# Patient Record
Sex: Male | Born: 1945 | Race: White | Hispanic: No | Marital: Married | Smoking: Former smoker
Health system: Southern US, Community
[De-identification: ages and names within clinical notes are randomized; demographics above are authoritative.]

## PROBLEM LIST (undated history)

## (undated) DIAGNOSIS — K5790 Diverticulosis of intestine, part unspecified, without perforation or abscess without bleeding: Secondary | ICD-10-CM

## (undated) DIAGNOSIS — Z95820 Peripheral vascular angioplasty status with implants and grafts: Secondary | ICD-10-CM

## (undated) DIAGNOSIS — I255 Ischemic cardiomyopathy: Secondary | ICD-10-CM

## (undated) DIAGNOSIS — E118 Type 2 diabetes mellitus with unspecified complications: Secondary | ICD-10-CM

## (undated) DIAGNOSIS — E782 Mixed hyperlipidemia: Secondary | ICD-10-CM

## (undated) DIAGNOSIS — M545 Low back pain: Secondary | ICD-10-CM

## (undated) DIAGNOSIS — K921 Melena: Secondary | ICD-10-CM

## (undated) DIAGNOSIS — K219 Gastro-esophageal reflux disease without esophagitis: Secondary | ICD-10-CM

## (undated) DIAGNOSIS — I723 Aneurysm of iliac artery: Secondary | ICD-10-CM

## (undated) DIAGNOSIS — I2510 Atherosclerotic heart disease of native coronary artery without angina pectoris: Secondary | ICD-10-CM

## (undated) DIAGNOSIS — N200 Calculus of kidney: Secondary | ICD-10-CM

## (undated) DIAGNOSIS — E119 Type 2 diabetes mellitus without complications: Secondary | ICD-10-CM

## (undated) DIAGNOSIS — I4891 Unspecified atrial fibrillation: Secondary | ICD-10-CM

## (undated) DIAGNOSIS — M2550 Pain in unspecified joint: Secondary | ICD-10-CM

## (undated) DIAGNOSIS — I10 Essential (primary) hypertension: Secondary | ICD-10-CM

## (undated) DIAGNOSIS — R001 Bradycardia, unspecified: Secondary | ICD-10-CM

## (undated) DIAGNOSIS — I219 Acute myocardial infarction, unspecified: Secondary | ICD-10-CM

## (undated) DIAGNOSIS — M1990 Unspecified osteoarthritis, unspecified site: Secondary | ICD-10-CM

## (undated) DIAGNOSIS — R42 Dizziness and giddiness: Secondary | ICD-10-CM

## (undated) DIAGNOSIS — F419 Anxiety disorder, unspecified: Secondary | ICD-10-CM

## (undated) HISTORY — DX: Low back pain: M54.5

## (undated) HISTORY — DX: Pain in unspecified joint: M25.50

## (undated) HISTORY — PX: CYSTOSCOPY W/ URETEROSCOPY W/ LITHOTRIPSY: SUR380

## (undated) HISTORY — DX: Aneurysm of iliac artery: I72.3

## (undated) HISTORY — DX: Anxiety disorder, unspecified: F41.9

## (undated) HISTORY — DX: Melena: K92.1

## (undated) HISTORY — DX: Type 2 diabetes mellitus with unspecified complications: E11.8

## (undated) HISTORY — DX: Gastro-esophageal reflux disease without esophagitis: K21.9

## (undated) HISTORY — DX: Essential (primary) hypertension: I10

## (undated) HISTORY — DX: Mixed hyperlipidemia: E78.2

## (undated) HISTORY — DX: Atherosclerotic heart disease of native coronary artery without angina pectoris: I25.10

## (undated) HISTORY — DX: Acute myocardial infarction, unspecified: I21.9

## (undated) HISTORY — DX: Dizziness and giddiness: R42

## (undated) HISTORY — DX: Unspecified atrial fibrillation: I48.91

## (undated) HISTORY — DX: Diverticulosis of intestine, part unspecified, without perforation or abscess without bleeding: K57.90

## (undated) HISTORY — PX: CYSTOSCOPY W/ STONE MANIPULATION: SHX1427

---
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
## 2000-02-16 ENCOUNTER — Ambulatory Visit (HOSPITAL_COMMUNITY): Admission: RE | Admit: 2000-02-16 | Discharge: 2000-02-16 | Payer: Self-pay | Admitting: *Deleted

## 2000-02-16 ENCOUNTER — Encounter (INDEPENDENT_AMBULATORY_CARE_PROVIDER_SITE_OTHER): Payer: Self-pay | Admitting: Specialist

## 2001-08-22 ENCOUNTER — Emergency Department (HOSPITAL_COMMUNITY): Admission: EM | Admit: 2001-08-22 | Discharge: 2001-08-22 | Payer: Self-pay | Admitting: *Deleted

## 2001-08-22 ENCOUNTER — Encounter: Payer: Self-pay | Admitting: *Deleted

## 2004-01-28 ENCOUNTER — Ambulatory Visit (HOSPITAL_COMMUNITY): Admission: RE | Admit: 2004-01-28 | Discharge: 2004-01-28 | Payer: Self-pay | Admitting: *Deleted

## 2004-01-28 ENCOUNTER — Encounter (INDEPENDENT_AMBULATORY_CARE_PROVIDER_SITE_OTHER): Payer: Self-pay | Admitting: *Deleted

## 2004-07-12 HISTORY — PX: CARDIAC CATHETERIZATION: SHX172

## 2004-07-12 HISTORY — PX: CORONARY ARTERY BYPASS GRAFT: SHX141

## 2004-08-19 ENCOUNTER — Inpatient Hospital Stay (HOSPITAL_COMMUNITY): Admission: RE | Admit: 2004-08-19 | Discharge: 2004-08-24 | Payer: Self-pay | Admitting: Cardiothoracic Surgery

## 2006-10-26 ENCOUNTER — Encounter (INDEPENDENT_AMBULATORY_CARE_PROVIDER_SITE_OTHER): Payer: Self-pay | Admitting: Specialist

## 2006-10-26 ENCOUNTER — Ambulatory Visit (HOSPITAL_COMMUNITY): Admission: RE | Admit: 2006-10-26 | Discharge: 2006-10-26 | Payer: Self-pay | Admitting: *Deleted

## 2007-04-29 ENCOUNTER — Inpatient Hospital Stay (HOSPITAL_COMMUNITY): Admission: EM | Admit: 2007-04-29 | Discharge: 2007-04-29 | Payer: Self-pay | Admitting: Emergency Medicine

## 2007-05-12 ENCOUNTER — Ambulatory Visit: Payer: Self-pay | Admitting: Gastroenterology

## 2008-10-25 ENCOUNTER — Inpatient Hospital Stay (HOSPITAL_COMMUNITY): Admission: EM | Admit: 2008-10-25 | Discharge: 2008-10-28 | Payer: Self-pay | Admitting: Emergency Medicine

## 2008-12-06 ENCOUNTER — Ambulatory Visit (HOSPITAL_COMMUNITY): Admission: RE | Admit: 2008-12-06 | Discharge: 2008-12-06 | Payer: Self-pay | Admitting: Urology

## 2009-10-01 ENCOUNTER — Encounter: Admission: RE | Admit: 2009-10-01 | Discharge: 2009-10-01 | Payer: Self-pay | Admitting: Neurosurgery

## 2009-11-04 ENCOUNTER — Ambulatory Visit: Payer: Self-pay | Admitting: Vascular Surgery

## 2009-11-04 ENCOUNTER — Encounter: Admission: RE | Admit: 2009-11-04 | Discharge: 2009-11-04 | Payer: Self-pay | Admitting: Vascular Surgery

## 2009-11-18 HISTORY — PX: NM MYOCAR PERF WALL MOTION: HXRAD629

## 2010-01-20 ENCOUNTER — Ambulatory Visit: Payer: Self-pay | Admitting: Vascular Surgery

## 2010-02-12 ENCOUNTER — Ambulatory Visit (HOSPITAL_COMMUNITY): Admission: RE | Admit: 2010-02-12 | Discharge: 2010-02-12 | Payer: Self-pay | Admitting: Surgery

## 2010-02-12 ENCOUNTER — Ambulatory Visit: Payer: Self-pay | Admitting: Surgery

## 2010-02-18 ENCOUNTER — Inpatient Hospital Stay (HOSPITAL_COMMUNITY): Admission: RE | Admit: 2010-02-18 | Discharge: 2010-02-20 | Payer: Self-pay | Admitting: Vascular Surgery

## 2010-02-18 HISTORY — PX: ILIAC ARTERY STENT: SHX1786

## 2010-03-17 ENCOUNTER — Encounter: Admission: RE | Admit: 2010-03-17 | Discharge: 2010-03-17 | Payer: Self-pay | Admitting: Vascular Surgery

## 2010-03-17 ENCOUNTER — Ambulatory Visit: Payer: Self-pay | Admitting: Vascular Surgery

## 2010-03-31 ENCOUNTER — Ambulatory Visit: Payer: Self-pay | Admitting: Vascular Surgery

## 2010-08-01 ENCOUNTER — Other Ambulatory Visit: Payer: Self-pay | Admitting: Vascular Surgery

## 2010-08-01 DIAGNOSIS — I723 Aneurysm of iliac artery: Secondary | ICD-10-CM

## 2010-09-25 LAB — GLUCOSE, CAPILLARY
Glucose-Capillary: 110 mg/dL — ABNORMAL HIGH (ref 70–99)
Glucose-Capillary: 112 mg/dL — ABNORMAL HIGH (ref 70–99)
Glucose-Capillary: 118 mg/dL — ABNORMAL HIGH (ref 70–99)
Glucose-Capillary: 136 mg/dL — ABNORMAL HIGH (ref 70–99)
Glucose-Capillary: 154 mg/dL — ABNORMAL HIGH (ref 70–99)
Glucose-Capillary: 156 mg/dL — ABNORMAL HIGH (ref 70–99)
Glucose-Capillary: 157 mg/dL — ABNORMAL HIGH (ref 70–99)
Glucose-Capillary: 160 mg/dL — ABNORMAL HIGH (ref 70–99)
Glucose-Capillary: 167 mg/dL — ABNORMAL HIGH (ref 70–99)
Glucose-Capillary: 172 mg/dL — ABNORMAL HIGH (ref 70–99)
Glucose-Capillary: 184 mg/dL — ABNORMAL HIGH (ref 70–99)
Glucose-Capillary: 187 mg/dL — ABNORMAL HIGH (ref 70–99)

## 2010-09-25 LAB — COMPREHENSIVE METABOLIC PANEL
ALT: 16 U/L (ref 0–53)
ALT: 19 U/L (ref 0–53)
AST: 36 U/L (ref 0–37)
AST: 43 U/L — ABNORMAL HIGH (ref 0–37)
Albumin: 3 g/dL — ABNORMAL LOW (ref 3.5–5.2)
Albumin: 3.6 g/dL (ref 3.5–5.2)
Alkaline Phosphatase: 61 U/L (ref 39–117)
Alkaline Phosphatase: 72 U/L (ref 39–117)
BUN: 10 mg/dL (ref 6–23)
BUN: 7 mg/dL (ref 6–23)
CO2: 22 mEq/L (ref 19–32)
CO2: 23 mEq/L (ref 19–32)
Calcium: 7.9 mg/dL — ABNORMAL LOW (ref 8.4–10.5)
Calcium: 8.5 mg/dL (ref 8.4–10.5)
Chloride: 102 mEq/L (ref 96–112)
Chloride: 109 mEq/L (ref 96–112)
Creatinine, Ser: 0.87 mg/dL (ref 0.4–1.5)
Creatinine, Ser: 0.91 mg/dL (ref 0.4–1.5)
GFR calc Af Amer: >60 mL/min (ref >60)
GFR calc Af Amer: >60 mL/min (ref >60)
GFR calc non Af Amer: >60 mL/min (ref >60)
GFR calc non Af Amer: >60 mL/min (ref >60)
Glucose, Bld: 148 mg/dL — ABNORMAL HIGH (ref 70–99)
Glucose, Bld: 180 mg/dL — ABNORMAL HIGH (ref 70–99)
Potassium: 3.4 mEq/L — ABNORMAL LOW (ref 3.5–5.1)
Potassium: 3.7 mEq/L (ref 3.5–5.1)
Sodium: 134 mEq/L — ABNORMAL LOW (ref 135–145)
Sodium: 137 mEq/L (ref 135–145)
Total Bilirubin: 0.8 mg/dL (ref 0.3–1.2)
Total Bilirubin: 1 mg/dL (ref 0.3–1.2)
Total Protein: 5.4 g/dL — ABNORMAL LOW (ref 6.0–8.3)
Total Protein: 5.9 g/dL — ABNORMAL LOW (ref 6.0–8.3)

## 2010-09-25 LAB — CBC
HCT: 37 % — ABNORMAL LOW (ref 39.0–52.0)
HCT: 37.2 % — ABNORMAL LOW (ref 39.0–52.0)
HCT: 42.3 % (ref 39.0–52.0)
Hemoglobin: 13 g/dL (ref 13.0–17.0)
Hemoglobin: 13.1 g/dL (ref 13.0–17.0)
Hemoglobin: 14.9 g/dL (ref 13.0–17.0)
MCH: 29.6 pg (ref 26.0–34.0)
MCH: 29.7 pg (ref 26.0–34.0)
MCH: 29.9 pg (ref 26.0–34.0)
MCHC: 35.1 g/dL (ref 30.0–36.0)
MCHC: 35.2 g/dL (ref 30.0–36.0)
MCHC: 35.2 g/dL (ref 30.0–36.0)
MCV: 84.3 fL (ref 78.0–100.0)
MCV: 84.3 fL (ref 78.0–100.0)
MCV: 84.9 fL (ref 78.0–100.0)
Platelets: 102 10*3/uL — ABNORMAL LOW (ref 150–400)
Platelets: 120 10*3/uL — ABNORMAL LOW (ref 150–400)
Platelets: 134 10*3/uL — ABNORMAL LOW (ref 150–400)
RBC: 4.38 MIL/uL (ref 4.22–5.81)
RBC: 4.39 MIL/uL (ref 4.22–5.81)
RBC: 5.02 MIL/uL (ref 4.22–5.81)
RDW: 12.7 % (ref 11.5–15.5)
RDW: 12.7 % (ref 11.5–15.5)
RDW: 12.8 % (ref 11.5–15.5)
WBC: 10.1 10*3/uL (ref 4.0–10.5)
WBC: 7.3 10*3/uL (ref 4.0–10.5)
WBC: 7.5 10*3/uL (ref 4.0–10.5)

## 2010-09-25 LAB — PROTIME-INR
INR: 1.03 (ref 0.00–1.49)
INR: 1.12 (ref 0.00–1.49)
Prothrombin Time: 13.7 seconds (ref 11.6–15.2)
Prothrombin Time: 14.6 seconds (ref 11.6–15.2)

## 2010-09-25 LAB — BLOOD GAS, ARTERIAL
Acid-base deficit: 0.3 mmol/L (ref 0.0–2.0)
Bicarbonate: 23.8 mEq/L (ref 20.0–24.0)
Drawn by: 206361
FIO2: 0.21 %
O2 Saturation: 98.3 %
Patient temperature: 98.6
TCO2: 25 mmol/L (ref 0–100)
pCO2 arterial: 39 mmHg (ref 35.0–45.0)
pH, Arterial: 7.403 (ref 7.350–7.450)
pO2, Arterial: 79.3 mmHg — ABNORMAL LOW (ref 80.0–100.0)

## 2010-09-25 LAB — BASIC METABOLIC PANEL
BUN: 10 mg/dL (ref 6–23)
BUN: 8 mg/dL (ref 6–23)
CO2: 19 mEq/L (ref 19–32)
CO2: 28 mEq/L (ref 19–32)
Calcium: 7.7 mg/dL — ABNORMAL LOW (ref 8.4–10.5)
Calcium: 8.3 mg/dL — ABNORMAL LOW (ref 8.4–10.5)
Chloride: 105 mEq/L (ref 96–112)
Chloride: 105 mEq/L (ref 96–112)
Creatinine, Ser: 0.9 mg/dL (ref 0.4–1.5)
Creatinine, Ser: 1.09 mg/dL (ref 0.4–1.5)
GFR calc Af Amer: >60 mL/min (ref >60)
GFR calc Af Amer: >60 mL/min (ref >60)
GFR calc non Af Amer: >60 mL/min (ref >60)
GFR calc non Af Amer: >60 mL/min (ref >60)
Glucose, Bld: 138 mg/dL — ABNORMAL HIGH (ref 70–99)
Glucose, Bld: 168 mg/dL — ABNORMAL HIGH (ref 70–99)
Potassium: 3.8 mEq/L (ref 3.5–5.1)
Potassium: 3.8 mEq/L (ref 3.5–5.1)
Sodium: 133 mEq/L — ABNORMAL LOW (ref 135–145)
Sodium: 138 mEq/L (ref 135–145)

## 2010-09-25 LAB — CROSSMATCH
ABO/RH(D): A POS
Antibody Screen: NEGATIVE

## 2010-09-25 LAB — POCT I-STAT, CHEM 8
BUN: 13 mg/dL (ref 6–23)
Calcium, Ion: 1 mmol/L — ABNORMAL LOW (ref 1.12–1.32)
Chloride: 108 mEq/L (ref 96–112)
Creatinine, Ser: 1 mg/dL (ref 0.4–1.5)
Glucose, Bld: 178 mg/dL — ABNORMAL HIGH (ref 70–99)
HCT: 47 % (ref 39.0–52.0)
Hemoglobin: 16 g/dL (ref 13.0–17.0)
Potassium: 3.6 mEq/L (ref 3.5–5.1)
Sodium: 139 mEq/L (ref 135–145)
TCO2: 22 mmol/L (ref 0–100)

## 2010-09-25 LAB — APTT
aPTT: 33 seconds (ref 24–37)
aPTT: 40 seconds — ABNORMAL HIGH (ref 24–37)

## 2010-09-25 LAB — URINALYSIS, ROUTINE W REFLEX MICROSCOPIC
Bilirubin Urine: NEGATIVE
Glucose, UA: NEGATIVE mg/dL
Hgb urine dipstick: NEGATIVE
Ketones, ur: NEGATIVE mg/dL
Nitrite: NEGATIVE
Protein, ur: NEGATIVE mg/dL
Specific Gravity, Urine: 1.031 — ABNORMAL HIGH (ref 1.005–1.030)
Urobilinogen, UA: 1 mg/dL (ref 0.0–1.0)
pH: 6.5 (ref 5.0–8.0)

## 2010-09-25 LAB — SURGICAL PCR SCREEN
MRSA, PCR: NEGATIVE
Staphylococcus aureus: POSITIVE — AB

## 2010-09-25 LAB — MAGNESIUM
Magnesium: 1.6 mg/dL (ref 1.5–2.5)
Magnesium: 1.7 mg/dL (ref 1.5–2.5)

## 2010-09-25 LAB — AMYLASE
Amylase: 129 U/L — ABNORMAL HIGH (ref 0–105)
Amylase: 93 U/L (ref 0–105)

## 2010-09-29 ENCOUNTER — Ambulatory Visit: Payer: Self-pay | Admitting: Vascular Surgery

## 2010-09-29 ENCOUNTER — Inpatient Hospital Stay: Admission: RE | Admit: 2010-09-29 | Payer: Self-pay | Source: Ambulatory Visit

## 2010-10-13 ENCOUNTER — Ambulatory Visit: Payer: Self-pay | Admitting: Vascular Surgery

## 2010-10-20 ENCOUNTER — Ambulatory Visit (INDEPENDENT_AMBULATORY_CARE_PROVIDER_SITE_OTHER): Payer: Medicare Other | Admitting: Vascular Surgery

## 2010-10-20 ENCOUNTER — Ambulatory Visit
Admission: RE | Admit: 2010-10-20 | Discharge: 2010-10-20 | Disposition: A | Discharge status: Home / Self Care | Payer: Medicare Other | Source: Ambulatory Visit | Attending: Vascular Surgery | Admitting: Vascular Surgery

## 2010-10-20 DIAGNOSIS — I723 Aneurysm of iliac artery: Secondary | ICD-10-CM

## 2010-10-20 MED ORDER — IOHEXOL 350 MG/ML SOLN
100.0000 mL | Freq: Once | INTRAVENOUS | Status: AC | PRN
  Administered 2010-10-20: 100 mL via INTRAVENOUS

## 2010-10-21 LAB — BASIC METABOLIC PANEL
BUN: 13 mg/dL (ref 6–23)
BUN: 13 mg/dL (ref 6–23)
BUN: 14 mg/dL (ref 6–23)
BUN: 20 mg/dL (ref 6–23)
CO2: 26 mEq/L (ref 19–32)
CO2: 27 mEq/L (ref 19–32)
CO2: 28 mEq/L (ref 19–32)
CO2: 30 mEq/L (ref 19–32)
Calcium: 8.1 mg/dL — ABNORMAL LOW (ref 8.4–10.5)
Calcium: 8.7 mg/dL (ref 8.4–10.5)
Calcium: 8.7 mg/dL (ref 8.4–10.5)
Calcium: 9.1 mg/dL (ref 8.4–10.5)
Chloride: 105 mEq/L (ref 96–112)
Chloride: 106 mEq/L (ref 96–112)
Chloride: 107 mEq/L (ref 96–112)
Chloride: 107 mEq/L (ref 96–112)
Creatinine, Ser: 1.01 mg/dL (ref 0.4–1.5)
Creatinine, Ser: 1.23 mg/dL (ref 0.4–1.5)
Creatinine, Ser: 1.26 mg/dL (ref 0.4–1.5)
Creatinine, Ser: 1.6 mg/dL — ABNORMAL HIGH (ref 0.4–1.5)
GFR calc Af Amer: 53 mL/min — ABNORMAL LOW (ref >60)
GFR calc Af Amer: >60 mL/min (ref >60)
GFR calc Af Amer: >60 mL/min (ref >60)
GFR calc Af Amer: >60 mL/min (ref >60)
GFR calc non Af Amer: 44 mL/min — ABNORMAL LOW (ref >60)
GFR calc non Af Amer: 58 mL/min — ABNORMAL LOW (ref >60)
GFR calc non Af Amer: 60 mL/min — ABNORMAL LOW (ref >60)
GFR calc non Af Amer: >60 mL/min (ref >60)
Glucose, Bld: 110 mg/dL — ABNORMAL HIGH (ref 70–99)
Glucose, Bld: 129 mg/dL — ABNORMAL HIGH (ref 70–99)
Glucose, Bld: 181 mg/dL — ABNORMAL HIGH (ref 70–99)
Glucose, Bld: 98 mg/dL (ref 70–99)
Potassium: 3.6 mEq/L (ref 3.5–5.1)
Potassium: 3.8 mEq/L (ref 3.5–5.1)
Potassium: 4 mEq/L (ref 3.5–5.1)
Potassium: 4 mEq/L (ref 3.5–5.1)
Sodium: 138 mEq/L (ref 135–145)
Sodium: 138 mEq/L (ref 135–145)
Sodium: 139 mEq/L (ref 135–145)
Sodium: 140 mEq/L (ref 135–145)

## 2010-10-21 LAB — URINALYSIS, ROUTINE W REFLEX MICROSCOPIC
Bilirubin Urine: NEGATIVE
Glucose, UA: NEGATIVE mg/dL
Glucose, UA: NEGATIVE mg/dL
Ketones, ur: NEGATIVE mg/dL
Leukocytes, UA: NEGATIVE
Leukocytes, UA: NEGATIVE
Nitrite: NEGATIVE
Nitrite: POSITIVE — AB
Protein, ur: 100 mg/dL — AB
Specific Gravity, Urine: 1.025 (ref 1.005–1.030)
Specific Gravity, Urine: 1.025 (ref 1.005–1.030)
Urobilinogen, UA: 0.2 mg/dL (ref 0.0–1.0)
Urobilinogen, UA: 1 mg/dL (ref 0.0–1.0)
pH: 5 (ref 5.0–8.0)
pH: 6 (ref 5.0–8.0)

## 2010-10-21 LAB — GLUCOSE, CAPILLARY
Glucose-Capillary: 104 mg/dL — ABNORMAL HIGH (ref 70–99)
Glucose-Capillary: 110 mg/dL — ABNORMAL HIGH (ref 70–99)
Glucose-Capillary: 112 mg/dL — ABNORMAL HIGH (ref 70–99)
Glucose-Capillary: 114 mg/dL — ABNORMAL HIGH (ref 70–99)
Glucose-Capillary: 116 mg/dL — ABNORMAL HIGH (ref 70–99)
Glucose-Capillary: 130 mg/dL — ABNORMAL HIGH (ref 70–99)
Glucose-Capillary: 148 mg/dL — ABNORMAL HIGH (ref 70–99)
Glucose-Capillary: 149 mg/dL — ABNORMAL HIGH (ref 70–99)
Glucose-Capillary: 150 mg/dL — ABNORMAL HIGH (ref 70–99)
Glucose-Capillary: 171 mg/dL — ABNORMAL HIGH (ref 70–99)
Glucose-Capillary: 173 mg/dL — ABNORMAL HIGH (ref 70–99)
Glucose-Capillary: 91 mg/dL (ref 70–99)

## 2010-10-21 LAB — DIFFERENTIAL
Basophils Absolute: 0 10*3/uL (ref 0.0–0.1)
Basophils Absolute: 0 10*3/uL (ref 0.0–0.1)
Basophils Relative: 0 % (ref 0–1)
Basophils Relative: 0 % (ref 0–1)
Eosinophils Absolute: 0 10*3/uL (ref 0.0–0.7)
Eosinophils Absolute: 0 10*3/uL (ref 0.0–0.7)
Eosinophils Relative: 0 % (ref 0–5)
Eosinophils Relative: 0 % (ref 0–5)
Lymphocytes Relative: 12 % (ref 12–46)
Lymphocytes Relative: 7 % — ABNORMAL LOW (ref 12–46)
Lymphs Abs: 0.9 10*3/uL (ref 0.7–4.0)
Lymphs Abs: 1.3 10*3/uL (ref 0.7–4.0)
Monocytes Absolute: 0.6 10*3/uL (ref 0.1–1.0)
Monocytes Absolute: 0.9 10*3/uL (ref 0.1–1.0)
Monocytes Relative: 5 % (ref 3–12)
Monocytes Relative: 9 % (ref 3–12)
Neutro Abs: 10.9 10*3/uL — ABNORMAL HIGH (ref 1.7–7.7)
Neutro Abs: 8.1 10*3/uL — ABNORMAL HIGH (ref 1.7–7.7)
Neutrophils Relative %: 78 % — ABNORMAL HIGH (ref 43–77)
Neutrophils Relative %: 87 % — ABNORMAL HIGH (ref 43–77)

## 2010-10-21 LAB — CBC
HCT: 43 % (ref 39.0–52.0)
HCT: 46.8 % (ref 39.0–52.0)
Hemoglobin: 15.1 g/dL (ref 13.0–17.0)
Hemoglobin: 16.1 g/dL (ref 13.0–17.0)
MCHC: 34.4 g/dL (ref 30.0–36.0)
MCHC: 35.1 g/dL (ref 30.0–36.0)
MCV: 86.6 fL (ref 78.0–100.0)
MCV: 87.7 fL (ref 78.0–100.0)
Platelets: 164 10*3/uL (ref 150–400)
Platelets: 166 10*3/uL (ref 150–400)
RBC: 4.97 MIL/uL (ref 4.22–5.81)
RBC: 5.34 MIL/uL (ref 4.22–5.81)
RDW: 12.3 % (ref 11.5–15.5)
RDW: 12.5 % (ref 11.5–15.5)
WBC: 10.4 10*3/uL (ref 4.0–10.5)
WBC: 12.4 10*3/uL — ABNORMAL HIGH (ref 4.0–10.5)

## 2010-10-21 LAB — URINE MICROSCOPIC-ADD ON

## 2010-10-21 LAB — HEMOGLOBIN A1C
Hgb A1c MFr Bld: 6.9 % — ABNORMAL HIGH (ref 4.6–6.1)
Mean Plasma Glucose: 151 mg/dL

## 2010-10-21 NOTE — Assessment & Plan Note (Signed)
OFFICE VISIT  [name], [name] DOB:  [birth year]                                       10/20/2010 ZOXWR#:[id number]  The patient returns for continued followup regarding his aortic stent graft placement which I performed in August 2011 for right common iliac artery aneurysm.  He has done well since his procedure.  He initially developed a small lymphocele in the right inguinal area which did not recur following aspiration.  He has had no chills, fever, abdominal, or back pain symptoms.  He has had some occasional discomfort in his neck bilaterally, extending down into the lateral chest area and has taken Prilosec for suspected acid reflux.  He had coronary artery bypass grafting in 2006 by Dr. Donata Clay.  CHRONIC MEDICAL PROBLEMS: 1. Coronary artery disease. 2. History of type 1 diabetes mellitus. 3. Hypertension. 4. Negative for COPD or stroke.  SOCIAL HISTORY:  He is married.  He is retired.  Does not use tobacco or alcohol.  REVIEW OF SYSTEMS:  Negative for dyspnea on exertion, PND, orthopnea. No claudication symptoms.  Does have pain in both feet.  Possible symptoms of reflux esophagitis.  All other systems are negative for a complete review of systems.  PHYSICAL EXAM:  Blood pressure 165/98, heart rate 67, respirations 18. General:  He is a well-developed, well-nourished male in no apparent distress, alert and oriented x3.  HEENT exam is normal for age.  EOMs intact.  Lungs:  Clear to auscultation.  No rhonchi or wheezing. Cardiovascular exam reveals a regular rhythm, no murmurs, carotid pulses 3+, no audible bruits.  Abdomen is soft, nontender, with no palpable masses.  Musculoskeletal exam is free of major deformities.  Neurologic exam is normal.  Lower extremity exam reveals 3+ femoral and dorsalis pedis pulses bilaterally.  Today, I ordered a CT angiogram which I have reviewed by computer.  This reveals no evidence of endoleak and the size of  the common iliac aneurysm is stable at about 32-33 mm.  I reassured him regarding these findings.  He will return in 6 months with a CT angiogram and then we will follow him on an annual basis alternating duplex scanning and CT scan.  I also encouraged him to see his cardiologist, which he will be doing in the next few weeks to further delineate his chest discomfort versus reflux esophagitis.    [name], M.D. Electronically Signed  JDL/MEDQ  D:  10/20/2010  T:  10/21/2010  Job:  [id number]

## 2010-10-22 ENCOUNTER — Other Ambulatory Visit: Payer: Self-pay | Admitting: Vascular Surgery

## 2010-10-22 DIAGNOSIS — I714 Abdominal aortic aneurysm, without rupture, unspecified: Secondary | ICD-10-CM

## 2010-11-24 NOTE — Assessment & Plan Note (Signed)
OFFICE VISIT   [name], [name]  DOB:  1945/09/09                                       03/17/2010  GNFAO#:[id number]   The patient returns today for his initial followup regarding treatment  for a right common iliac artery aneurysm with dilatation of his terminal  aorta.  He had an aorto-to-right external iliac and left common iliac  stent graft inserted (Gore-excluder) done on August 10.  He had an  unremarkable postoperative course but did begin having some chills and  fever 4 days ago, which lasted about 48 hours.  He was started on Keflex  500 mg t.i.d. on September 4 and states that his symptoms of fever have  resolved but he does feel fairly weak with a poor appetite.  He has had  no chest pain, dyspnea on exertion, cough, dysuria, sore throat or any  other symptoms.   On physical exam today, blood pressure 139/80, heart rate 71,  temperature 97.4, respirations 20.  Chest:  Clear to auscultation.  Abdomen:  Soft, nontender, with no pulsatile mass and no tenderness.  Both inguinal areas are well-healed.  He has 3+ femoral, popliteal and  posterior tibial pulses bilaterally.  He does have the lymphocele  palpable in the right inguinal area with no inflammation of the  overlying skin and no drainage.   Today I ordered a CT angiogram, which I have reviewed on the computer.  His stent graft is in excellent position with no evidence of any  endoleak or migration.  He does have a significant lymphocele anterior  to the femoral vessels on the right.  Today I aspirated about 45 cc of  fluid, which does not appear purulent but is not perfectly clear like  lymphatic fluid.  It is not malodorous.  It was sent for Gram stain and  routine culture.  He is continuing on his Keflex 500 mg t.i.d. and we  will check the culture results in 48 hours and see if any organisms are  growing and whether the cultures need to be adjusted.  If there is no  infection, he will  return in 2 weeks for further examination unless he  should have any chills and fever or erythema or drainage from his right  inguinal area.  In general, I think he is doing well.  We will follow up  this lymphocele in 2 weeks unless he has other problems.     [name], M.D.  Electronically Signed   JDL/MEDQ  D:  03/17/2010  T:  03/18/2010  Job:  [id number]

## 2010-11-24 NOTE — H&P (Signed)
NAMECAYLEB, [name]                 ACCOUNT NO.:  [id number]   MEDICAL RECORD NO.:  [id number]          PATIENT TYPE:  INP   LOCATION:  A301                          FACILITY:  [facility]   PHYSICIAN:  Dorris Singh, DO    DATE OF BIRTH:  07-29-1945   DATE OF ADMISSION:  10/25/2008  DATE OF DISCHARGE:  LH                              HISTORY & PHYSICAL   The patient is a 65 year old male who presented to the [facility]  emergency room complaining of flank pain.  The patient currently is  unassigned for a doctor up here in [place].  History is that he came  in today with right-sided flank pain.  He was treated with IV fluids and  given pain medication.  He states that the pain started today.  There  was no injury.  There was no preexisting problem.  He had had a kidney  stone on the right side about 30 years ago.  He did not notice any  hematuria or any type of fever or chills.  There was pain with movement,  and it was located in the right flank.  There was no radiation.  The  patient said that it reminded him of his previous kidney stone.  It was  9/10 at its worse.  Apparently, when they were trying to discharge the  patient, he became very weak and could not stand up on his own.  They  attempted to discharge him twice according to the P.A., and he just was  too weak to go.  He was recommended to follow up with Dr. Rito Ehrlich on  Monday.  Actually, they had attempted to have him leave 3 times, but he  gets very nauseated and weak, and when he sits up, he has to lie back  down.  At that point in time, it was determined we would admit the  patient for observation.   His past medical history is significant for:  1. Hypertension.  2. Diabetes.  3. Hypercholesterolemia.   SURGICAL HISTORY:  He had CABG and colonoscopy with 3 polyps removed.   SOCIAL HISTORY:  He is a smoker but smokes cigars occasionally.  Nondrinker.  No drug abuse.   ALLERGIES:  He has no known drug allergies.   His current list of medications:  1. Glucotrol with no dose given.  2. Aspirin with no dose given.  3. Toprol-XL 25 mg twice a day.  4. Zocor 20 mg daily.  5. Lantus 40 units subcutaneous daily.  6. Metformin 500 mg twice a day.  7. Temazepam 15 mg as needed.   REVIEW OF SYSTEMS:  CONSTITUTIONALLY:  Positive for weakness.  EYES:  Negative for changes in vision.  EARS, NOSE, MOUTH AND THROAT:  Negative  for  hearing loss, sore throat or rhinorrhea.  CARDIOVASCULAR:  Negative  for chest pain or palpitations.  RESPIRATORY:  Negative for shortness of  breath or wheezing.  GASTROINTESTINAL:  Positive for nausea.  Positive  for abdominal pain in right lower quadrant.  GENITOURINARY:  Positive  for flank pain.  Negative for dysuria or urgency.  MUSCULOSKELETAL:  Negative for trauma.  SKIN:  Negative for pruritus or rash or any  decubitus ulcers.  NEUROLOGICAL:  Positive for dizziness.  Negative for  loss of consciousness.  PSYCHIATRIC:  Negative for change in affect or  depression or anxiety.  ALLERGIC:  All negative.   PHYSICAL EXAMINATION:  His current vitals are as follows:  Temperature  97.4, pulse 77, respirations 20, blood pressure 155/98.  GENERAL:  The patient is a 65 year old Caucasian male who is well  developed and well nourished in no acute distress.  HEENT:  Is normocephalic, atraumatic.  Eyes are PERRL.  EOMI.  No  scleral icterus or conjunctival injection.  Ears:  PMI is visualized  bilaterally.  Nose:  Turbinates are moist and teeth are in good repair.  NECK:  Is supple.  No lymphadenopathy noted.  Full range of motion.  HEART:  Regular rate and rhythm.  No murmurs, rubs or gallops.  LUNGS:  Clear to auscultation bilaterally.  No wheezes, rales or  rhonchi.  ABDOMEN:  Soft, nontender, nondistended.  There is some tenderness with  the right flank on palpation as well as some inguinal tenderness on  palpation.  EXTREMITIES:  Positive pulses.  No edema, ecchymosis or  cyanosis.  NEUROLOGICAL:  Cranial nerves II-XII grossly intact.  He is alert and  oriented x3 and good motor strength in all extremities.   LIMITED EXAMINATION:  White count 12.4, hemoglobin 16.1, hematocrit  46.8, platelet count 164.  Sodium 138, potassium 4.0, chloride 105, CO2  26, glucose 108, BUN 13, creatinine 1.01.  His UA that was done showed  large blood, trace protein, hazy in appearance, rare bacteria.   ASSESSMENT AND PLAN:  1. Kidney stone.  2. Unretractable nausea.  3. Insulin-dependent diabetes.  4. Hypertension.   So, the plan will be to admit the patient to service of [facility] for  observation.  Will start IV fluids.  Will give him antiemetics.  Will  also have Dr. Rito Ehrlich come see him, and the emergency room did contact  him regarding possible admission.  The patient was mentioned to see him  on Monday.  However, since he has been admitted, will see if Dr.  Rito Ehrlich can see him while he is in-house.  Will put him on all of his  home medications.  Will do DVT and GI prophylaxis.  Will continue to  monitor him.  Hopefully, he should be able to be discharged in the next  24 hours as long as he remains stable.  Will continue to strain his  urine as he urinates.      Dorris Singh, DO  Electronically Signed     CB/MEDQ  D:  10/26/2008  T:  10/26/2008  Job:  [id number]

## 2010-11-24 NOTE — Group Therapy Note (Signed)
[name], [name]                 ACCOUNT NO.:  [id number]   MEDICAL RECORD NO.:  [id number]          PATIENT TYPE:  INP   LOCATION:  A301                          FACILITY:  [facility]   PHYSICIAN:  Osvaldo Shipper, MD     DATE OF BIRTH:  1945/12/19   DATE OF PROCEDURE:  DATE OF DISCHARGE:                                 PROGRESS NOTE   The patient's PMD is [name].   SUBJECTIVE:  The patient is still complaining of 8/10 pain on the right  flank area, had some nausea yesterday, but none today so far.  He has  not had any bowel movements yet.  He tolerated some food last night.  Actually this morning, he has been n.p.o.   PHYSICAL EXAMINATION:  VITAL SIGNS:  Temperature is 98.2, heart rate in  the 60s, respiratory rate 16, blood pressure 148/81, and saturation 95%  on room air.  GENERAL:  Overweight white male in no distress.  HEENT:  No pallor.  No icterus.  NECK:  Soft and supple.  No thyromegaly is appreciated.  LUNGS:  Clear to auscultation bilaterally.  No wheezing, rales, or  rhonchi.  CARDIOVASCULAR:  S1 and S2 is normal and regular.  No murmurs  appreciated.  No S3 and S4.  No rubs.  No bruits.  ABDOMEN:  Soft.  There is tenderness in the right flank area.  No  rebound, rigidity, or guarding is present.  GU:  Unremarkable.  EXTREMITIES:  No edema.  MUSCULOSKELETAL:  Unremarkable.  NEUROLOGIC:  He is alert and oriented x3.  No focal neurological  deficits are present.   LABORATORY DATA:  Glucose 110, creatinine is 1.6 this morning.  Hb A1c  was 6.9 with a mean glucose of 151.   IMAGING STUDIES:  He had an abdominal film done yesterday which showed a  5-mm proximal right ureteral calculus without significant change in  position compared to the CT done day before yesterday.   ASSESSMENT AND PLAN:  1. Right ureterolithiasis.  The patient was seen by Dr. Dennie Maizes      and the plan is for cystoscopy and stone retrieval and stent      placement today.  This should  definitely help with the      symptomatology.  If he tolerates the procedure well and if his pain      is reasonably well controlled, he should be able to go home later      today.  2. Diabetes, insulin-dependent.  We are holding his Lantus, his      Glucophage, as well as his glyburide.  His blood sugars are running      in 130, 149, 148.  Once he is eating on a consistent basis, we will      re-introduce some of his medications.  3. History of coronary artery disease status post coronary artery      bypass grafting.  That is also a stable issue at this time.  4. History of hypertension.  Stable.   So, disposition at this time is differed to Dr. Dennie Maizes.  We will  see how the patient tolerates his procedure today.  Medically, he is  stable.      Osvaldo Shipper, MD  Electronically Signed     GK/MEDQ  D:  10/27/2008  T:  10/28/2008  Job:  [id number]

## 2010-11-24 NOTE — Discharge Summary (Signed)
[name], [name]                 ACCOUNT NO.:  [id number]   MEDICAL RECORD NO.:  [id number]          PATIENT TYPE:  INP   LOCATION:  A301                          FACILITY:  [facility]   PHYSICIAN:  Osvaldo Shipper, MD     DATE OF BIRTH:  1946/03/20   DATE OF ADMISSION:  10/25/2008  DATE OF DISCHARGE:  04/19/2010LH                               DISCHARGE SUMMARY   Please review H and P dictated at the time of admission for details  regarding the patient's presenting illness.   DISCHARGE DIAGNOSES:  1. Right ureteral calculus, status post right ureteral stent      placement.  2. Abdominal pain secondary to right ureteral calculus, improved.  3. History of insulin-dependent diabetes.  4. History of hypertension.  5. History of coronary artery disease, status post coronary artery      bypass graft, stable.   BRIEF HOSPITAL COURSE:  Abdominal pain.  This is a 65 year old Caucasian  male, who presented to the hospital with complaints of flank pain.  The  patient underwent CT of the abdomen and pelvis without contrast, which  showed a right ureteral obstructing calculus with moderate right-sided  hydronephrosis.  The patient was attempted to discharge from the ED.  However, he continued to complain of severe pain, so he was admitted to  the hospital.  He underwent urological evaluation and a repeat abdominal  x-ray which showed 5 mm proximal right ureteral calculus with no change  compared to the CT scan.  This prompted a cystoscopy and right  retrograde pyelogram and a right ureteral stent placement by Dr. Dennie Maizes.  This was achieved yesterday.  However, the patient started  passing pinkish urine and still had a lot of discomfort, so we kept him  here overnight.  This morning the patient is feeling much better.  His  pain has resolved.  His urine has cleared up.  He is keen on going home  now.  His vital signs are stable.  Temperature 98.1, blood pressure  141/71, heart rate 65,  respiratory rate 20, and saturation 92% on room  air.  His lungs clear to auscultation bilaterally.  Cardiovascular  system is normal regular.  Abdomen is soft, nontender, and nondistended.   His labs show this creatinine is back to his baseline of 1.23.  Rest of  the electrolytes are normal.   DISCHARGE MEDICATIONS:  1. Cipro 500 mg b.i.d. for 7 days.  2. Percocet 5/325 as needed.  3. Continue Glucotrol once a day.  4. Aspirin to be held until cleared by Dr. Rito Ehrlich.  5. Toprol-XL 25 mg b.i.d.  6. Zocor 20 mg daily.  7. Lantus 48 units daily to be started on October 29, 2008.  8. Metformin 500 mg b.i.d.  9. Temazepam 15 mg nightly as needed.   DIET:  Modified carbohydrate.   PHYSICAL ACTIVITY:  No restrictions.   Follow up with Dr. Rito Ehrlich.  The patient is to call his office today to  schedule a followup appointment to remove the stent.   Follow up with his PMD as needed.  PROCEDURES UNDERGONE DURING THIS ADMISSION:  Cystoscopy, right  retrograde pyelogram as well as right ureteral stent placement.   CONSULTATION:  Dennie Maizes, MD, Urology.   Total time on this encounter 35 minutes.      Osvaldo Shipper, MD  Electronically Signed     GK/MEDQ  D:  10/28/2008  T:  10/28/2008  Job:  [id number]   cc:   Dennie Maizes, M.D.  Fax: [phone]

## 2010-11-24 NOTE — Assessment & Plan Note (Signed)
OFFICE VISIT   [name], [name]  DOB:  June 28, 1946                                       01/20/2010  ZOXWR#:[id number]   The patient returns for further discussion regarding his right iliac  aneurysm.  This was discovered by Dr. Newell Coral in April of this year.  The aneurysm has been evaluated by CT angiogram.  It is 3.1 cm in  maximum diameter.  His aorta appears to be relatively normal as is his  left iliac system.  There is some dilatation of his proximal right  common iliac artery measuring about 16 mm with right external iliac  being about 10 mm.  The aneurysm itself involves the origin of the right  internal iliac artery.   The patient has been evaluated with a Cardiolite which was normal with  good ejection fraction.  [name] at [facility]  has cleared him for surgery.   PHYSICAL EXAMINATION:  Today, blood pressure 152/96, heart rate 69,  temperature 97.8.  Abdomen:  Soft, nontender with no pulsatile mass.  He  has excellent femoral and posterior tibial pulses bilaterally.   I discussed with him the fact that we will need to embolize the right  internal iliac artery and scheduled that for Dr. Myra Gianotti on August 4 via  the left common femoral approach.  I have also discussed that we insert  an aorto-bi-iliac graft or we may insert a right iliac limb only if we  feel that we can get an adequate seal proximal iliac artery.  We will  review the angiogram before making that decision.  I have scheduled his  stent graft to be done on Wednesday, August 10.  He would like to  proceed and will proceed with embolizations right internal iliac artery.     [name], M.D.  Electronically Signed   JDL/MEDQ  D:  01/20/2010  T:  01/21/2010  Job:  [id number]

## 2010-11-24 NOTE — Consult Note (Signed)
NEW PATIENT CONSULTATION   [name], [name]  DOB:  May 04, 1946                                       11/04/2009  PIRJJ#:[id number]   The patient is a 65 year old male patient referred by [name] for  right iliac aneurysm.  This was discovered recently on an MRI of the  lumbar spine done for back pain and left flank discomfort.  The patient  has a history of renal calculous 1 year ago and actually the aneurysm  was present at that time but slightly smaller.  On the MRI the aneurysm  measured 3.1 cm in maximum diameter.  I ordered a CT angiogram to be  performed before the patient returned today which I have reviewed and it  appears that the aneurysm measures 32 mm in maximum diameter involving  the distal right common iliac artery.  The aorta is not aneurysmal nor  is the left common iliac artery.   CHRONIC MEDICAL PROBLEMS:  1. Coronary artery disease.  2. Hyperlipidemia.  3. History of renal calculus on the right.  4. Diabetes mellitus type 1.  5. Heel spurs.  6. Hypertension.   PAST SURGICAL HISTORY:  Includes coronary artery bypass grafting in '06  by Dr. Donata Clay.   FAMILY HISTORY:  Negative for coronary artery disease, diabetes and  stroke.   SOCIAL HISTORY:  The patient is married, has three children and is  retired.  Does not use tobacco or alcohol.   REVIEW OF SYSTEMS:  Positive for reflux esophagitis, occasional  dizziness and vertigo.  All other systems in review of systems are  negative.   PHYSICAL EXAM:  Vital signs:  Blood pressure 159/94, heart rate 69,  temperature 98.  General:  He is a well-developed, well-nourished male  in no apparent distress, alert and oriented x3.  Neck:  Supple, 3+  carotid pulses are palpable.  HEENT:  Exam normal.  EOMs intact.  Chest:  Clear to auscultation.  No rhonchi or wheezing.  Cardiovascular:  Regular rhythm.  No murmurs.  Carotid pulses 3+, no bruits.  Abdomen:  Soft, nontender with no masses.   Lower extremities:  Exam reveals 3+  femoral, popliteal and dorsalis pedis pulses bilaterally.  Musculoskeletal:  Exam is free of major deformities.  Neurological:  Normal.  Skin:  Free of rash.   I reviewed all of the medical notes supplied by [name] and also  from the previous evaluation and previous hospitalization in 2010 for  the right ureteral calculus.  The patient should have this right iliac  artery aneurysm treated and we will schedule him for aortobifemoral  angiography with embolization of his right internal iliac artery by Dr.  Myra Steele.  At length I discussed this with the patient and his wife  regarding possible buttock claudication as a complication or secondary  effect of the embolization.  We also discussed stent grafting of the  iliac artery aneurysm through a right femoral incision.  He will be  seeing his cardiologist in the near future who is [name] at  [facility].  We will need a Cardiolite exam to be  performed and when he is ready to schedule the angiogram he will be in  touch with [place] in the next few weeks.     [name], M.D.  Electronically Signed   JDL/MEDQ  D:  11/04/2009  T:  11/05/2009  Job:  [id number]   cc:   [name], M.D.  [name], M.D.

## 2010-11-24 NOTE — Consult Note (Signed)
NAMEHARSHAN, [name]                 ACCOUNT NO.:  [id number]   MEDICAL RECORD NO.:  [id number]          PATIENT TYPE:  INP   LOCATION:  A301                          FACILITY:  [facility]   PHYSICIAN:  Dennie Maizes, M.D.   DATE OF BIRTH:  [birth year]   DATE OF CONSULTATION:  DATE OF DISCHARGE:                                 CONSULTATION   REASON FOR CONSULTATION:  Right flank pain, right upper ureteral  calculus with obstruction.   CONSULTATION REPORT:  A 65 year old male came to emergency room at [facility] yesterday.  He had severe right flank pain and right lower  quadrant abdominal pain.  He has a past history of urolithiasis.  He had  a stone about 30 years ago which he passed.  He denied having any fever,  chills, voiding difficulty or gross hematuria.  Evaluation was done with  a noncontrast CT scan of abdomen and pelvis.  This revealed a 6-mm size  right upper calculus with obstruction and hydronephrosis.  The patient's  pain was controlled in the emergency room, but he had nausea and  weakness.  He was admitted to hospital for further treatment.  I have  been asked to see the patient for further evaluation.   PAST MEDICAL HISTORY:  1. History of hypertension.  2. Diabetes mellitus.  3. Hypercholesterolemia.  4. Elevated cholesterol.  5. Coronary artery disease status post CABG.   MEDICATIONS:  1. Metoprolol 25 mg p.o. b.i.d.  2. Protonix 40 mg p.o. daily.  3. Zocor 20 mg p.o. daily.  4. Albuterol inhaler.  5. Glucotrol, dose unknown.  6. Lantus insulin 40 units subcu daily.  7. Metformin 500 mg p.o. b.i.d.  8. Temazepam 15 mg p.r.n. sleep.   ALLERGIES:  None.   PHYSICAL EXAMINATION:  ABDOMEN:  Soft, No palpable flank mass.  Mild  right costovertebral angle tenderness is noted.  Bladder not palpable.  Penis and testes are normal.   ADMISSION LABORATORY DATA:  CBC:  WBC 12.4, hemoglobin 16.1, hematocrit  46.8.  Sodium 138, potassium 4.0, chloride 105, CO2  26, glucose 108, BUN  13, creatinine 1.1.  Urinalysis revealed large blood, trace protein,  rare bacteria.   IMPRESSION:  Right upper ureteral calculus obstruction, right renal  colic, right hydronephrosis, diabetes mellitus, hypertension.   PLAN:  X-ray of the KUB area was done.  This revealed a 5-mm size stone  in the right upper ureter at the level of the L3 transverse process.  There was no change in size and location of the stone.   I have discussed with the patient regarding management options.  He had  been having the pain for some time and he would like to proceed with  surgery.  He is scheduled to undergo cystoscopy, retrograde pyelogram  and right ureteral stent placement under anesthesia on October 27, 2008.  Obstructing stone later will be treated with ESL as an outpatient.  I  discussed with the patient regarding the diagnosis, operative details of  the treatment, outcome, possible risks and complications and he has  agreed for the procedure  to be done.      Dennie Maizes, M.D.  Electronically Signed     SK/MEDQ  D:  10/26/2008  T:  10/27/2008  Job:  [id number]

## 2010-11-24 NOTE — Assessment & Plan Note (Signed)
OFFICE VISIT   IRELAND, CHAGNON  DOB:  Dec 30, 1945                                       03/31/2010  ZOXWR#:[id number]   [name] return for further follow-up regarding his aortic stent graft  placed August 10 by me into both common iliac arteries Emeline Darling excluder).  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  He finished a  10 to a course of Keflex a few days ago.  He did start having some  painless bloody diarrhea.  He has a history of diverticulosis and has  had that in the past.  He has had no abdominal pain or other generalized  symptoms including fever.  He is going to his medical doctor today for  further evaluation of this.   On exam today, his abdomen is soft, nontender with no pulsatile mass  palpable.  He has 3+ femoral, popliteal, and posterior tibial pulses  bilaterally.  Both inguinal incisions are  well-healed.  No evidence of  recurrence of the lymphocele in either inguinal area.   I reassured him regarding these findings.  He will return in 6 months  with a CT angiogram for further follow up of his stent graft and have  his blood checked by his medical doctor today.     [name], M.D.  Electronically Signed   JDL/MEDQ  D:  03/31/2010  T:  04/01/2010  Job:  [id number]

## 2010-11-24 NOTE — Op Note (Signed)
NAMENATALIO, SALOIS                 ACCOUNT NO.:  [id number]   MEDICAL RECORD NO.:  [id number]          PATIENT TYPE:  INP   LOCATION:  A301                          FACILITY:  [facility]   PHYSICIAN:  Dennie Maizes, M.D.   DATE OF BIRTH:  [birth year]   DATE OF PROCEDURE:  10/27/2008  DATE OF DISCHARGE:                               OPERATIVE REPORT   PREOPERATIVE DIAGNOSES:  1. Right upper ureteral calculus obstruction.  2. Right hydronephrosis.  3. Right renal colic.   POSTOPERATIVE DIAGNOSES:  1. Right upper ureteral calculus obstruction.  2. Right hydronephrosis.  3. Right renal colic.   OPERATIVE PROCEDURE:  Cystoscopy, right retrograde pyelogram, and right  ureteral stent placement.   ANESTHESIA:  Spinal.   SURGEON:  Dennie Maizes, MD   COMPLICATIONS:  None.   ESTIMATED BLOOD LOSS:  None.   DRAINS:  A 6-French x 26-cm size right ureteral stent.   SPECIMEN:  None.   INDICATIONS FOR PROCEDURE:  A 65 year old male was admitted to hospital  with severe right flank pain.  X-rays revealed a 6-mm size right upper  ureteral calculus obstruction or hydronephrosis.  The patient had severe  pain and was unable to pass the stone.  He was taken to the operating  room today for cystoscopy, right retrograde pyelogram, and right  ureteral stent placement.  Obstructing stone will later be treated with  ESL as an outpatient.   DESCRIPTION OF THE PROCEDURE:  Spinal anesthesia was induced and the  patient was placed on the OR table in the dorsal lithotomy position.  The lower abdomen and genitalia were prepped and draped in a sterile  fashion.  Cystoscopy was done with a 25-French scope.  Urethra was  normal.  There was moderate hypertrophy of prostate with partial  obstruction of the bladder neck area.  The bladder was found to be  trabeculated.  It was difficult to locate the ureteral orifices.  Right  ureteral orifice was located.  A 5-French open-ended catheter was then  placed in  the right ureteral orifice.  0.38 Bentson guide was then  inserted into the right collecting system.  The open-ended catheter was  then advanced with a guidewire and placed in the lower ureter.  Contrast  was injected.  There was a filling defect in the upper ureter at the  level of the L3 transverse process.  There was proximal mild  hydronephrosis and hydroureter.  There were multiple air bubbles in the  collecting system during injection.   The Bentson guidewire was then advanced to the renal pelvis.  The open-  ended catheter was removed.  A 6-French x 26-cm size stent was then  inserted into the right collecting system.  I plan to treat the  obstructing stone with extracorporeal shock wave lithotripsy as an  outpatient.  The patient was transferred to the PACU in a satisfactory  condition.      Dennie Maizes, M.D.  Electronically Signed     SK/MEDQ  D:  10/27/2008  T:  10/27/2008  Job:  [id number]

## 2010-11-27 NOTE — Op Note (Signed)
[name], [name]                 ACCOUNT NO.:  [id number]   MEDICAL RECORD NO.:  [id number]          PATIENT TYPE:  INP   LOCATION:  2304                         FACILITY:  [facility]   PHYSICIAN:  Kathlee Nations Trigt III, M.D.DATE OF BIRTH:  03/22/1946   DATE OF PROCEDURE:  08/19/2004  DATE OF DISCHARGE:                                 OPERATIVE REPORT   OPERATION:  Coronary artery bypass grafting x4 (left internal mammary artery  to LAD, saphenous vein graft to diagonal, saphenous vein graft to obtuse  marginal, saphenous vein graft to posterior descending).   POSTOPERATIVE DIAGNOSIS:  Class IV progressive angina with severe three-  vessel coronary disease.   POSTOPERATIVE DIAGNOSIS:  Class IV progressive angina with severe three-  vessel coronary disease.   SURGEON:  Kerin Perna, M.D.   ASSISTANT:  Rowe Clack, P.A.-C.   ANESTHESIA:  General by Quita Skye. Krista Blue, M.D.   INDICATIONS FOR PROCEDURE:  The patient is a 65 year old white male with  positive risk factors for coronary disease and positive stress test. A  cardiac cath by Antionette Char, M.D.  demonstrated three-vessel coronary  disease not amenable to percutaneous intervention. He is felt to be a  candidate for surgical revascularization.   Prior to surgery, I examined the patient in the office and reviewed the  results of the cardiac catheterization with the patient and his wife. I  discussed indications and expected benefits of coronary bypass surgery for  treatment of his coronary artery disease. I reviewed the alternatives to  surgical therapy as well. I discussed with the patient major aspects of the  planned procedure including the choice of conduit for grafts, the location  of the surgical incisions, use of general anesthesia and cardiopulmonary  bypass, and the expected postoperative hospital recovery. I discussed with  the patient the risks to him of coronary bypass surgery including risks of  MI, CVA,  bleeding, blood transfusion requirement, infection, and death. He  understood these implications for the surgery and agreed to proceed with the  operation as planned under what I felt was an informed consent.   OPERATIVE FINDINGS:  The patient's saphenous vein was harvested  endoscopically from the right leg. It was small below the knee. The vein  graft to the diagonal had inadequate length to perform a proximal  anastomosis on the ascending aorta and for that reason it was joined to the  proximal portion of the circumflex vein graft for the proximal anastomosis.  The patient had evidence of coagulopathy and required a transfusion of  platelets and FFP following reversal of the protamine due to diffuse  coagulation dysfunction. This improved his coagulopathy. The patient's LAD  is intramyocardial. The patient's circumflex marginal is intramyocardial.  The posterior descending had diffuse distal disease.   PROCEDURE:  The patient was brought to the operating room and placed supine  on the operating table where general anesthesia was induced under invasive  hemodynamic monitoring. The chest, abdomen and legs were prepped with  Betadine and draped as a sterile field. A sternal incision was made as the  saphenous vein was harvested from the right leg. The left internal mammary  artery was harvested as a pedicle graft from its origin at the subclavian  vessels. It was a good vessel with adequate flow measuring 1.5 mm in  diameter. A sternal retractor was placed and the pericardium was opened and  suspended. Heparin was administered. The ACT was documented as being  therapeutic. Through pursestrings placed in the ascending aorta and right  atrium, the patient was cannulated and placed on bypass. The coronaries were  identified for grafting and the mammary artery and vein grafts were prepared  for the distal anastomoses. Cardioplegia catheters were placed in both the  ascending aorta and into  the coronary sinus for antegrade and retrograde  delivery of cold blood cardioplegia. The patient was cooled to 30 degrees.  Aortic crossclamp was applied. 800 cc of cold blood cardioplegia was  delivered in split doses between the antegrade aortic and retrograde  coronary sinus catheters. There was good cardioplegic arrest. Septal  temperature dropped less than 10 degrees. Topical iced saline was used to  augment myocardial preservation and a pericardial insulator pad was used to  protect the left phrenic nerve.   The distal coronary anastomoses were performed. First distal anastomosis was  to the posterior descending. This was a 1.5 mm vessel which was thickened  and had diffuse distal disease. A reversed saphenous vein was sewn end-to-  side with running 7-0 Prolene. There was good flow through graft. The second  distal anastomosis was to the diagonal. This had a proximal 90% calcified  stenosis. A reverse saphenous vein was sewn end-to-side with running 7-0  Prolene and there was good flow through graft. Cardioplegia was redosed. The  third distal anastomosis was to the distal obtuse marginal. It was  intramyocardial and heavily diseased and calcified. A reverse saphenous vein  was sewn end-to-side using running 7-0 Prolene and there was good flow  through this graft. The vessel diameter was 1.5-1.75 mm. The fourth distal  anastomosis was to the distal LAD. More proximally it was intramyocardial.  The LAD was 1.5 mm in diameter. Left internal mammary artery pedicle was  brought through an opening created in the left lateral pericardium and was  brought down onto the LAD and sewn end-to-side with running 8-0 Prolene.  There was excellent flow through the anastomosis after briefly releasing the  vascular bulldog on the mammary pedicle. The bulldog was then reapplied and  the pedicle secured to the epicardium with two 6-0 Prolene tacking sutures.  Cardioplegia was redosed.  While the  crossclamp was still in place, two proximal vein anastomoses were  placed in the aorta using a 4.0 mm punch and running 6-0 Prolene. These  proximals were for the circumflex vein graft and for the posterior  descending vein graft. Prior to release of the crossclamp, air was removed  from the coronaries and left side of heart using a dose of retrograde warm  blood cardioplegia as well as the usual de-airing maneuvers. The crossclamp  was removed.   The heart was cardioverted and returned to a regular rhythm. Using  atraumatic bulldog clamps on the circumflex vein graft, an end-to-side  anastomosis with the proximal end of the diagonal vein was sewn to the hood  of the circumflex vein graft using running 7-0 Prolene. After the  anastomosis was complete the bulldogs were removed and air was removed from  the vein grafts. There was good flow through all the grafts.   The  patient was rewarmed and reperfused. Temporary pacing wires were  applied. The proximal and distal anastomoses were checked and found to be  hemostatic. The lungs re-expanded and the ventilator was resumed. The  patient was weaned from bypass after being rewarmed to 37 degrees. He  required no inotropes. Blood pressure and cardiac output were stable.  Protamine was administered without adverse reaction. The cannulas were  removed. The mediastinum was irrigated with warm antibiotic irrigation.  There was persistent diffuse oozing and coagulopathy. The patient was given  platelets with some improvement, but not normal coagulation function. FFP  was ordered. The grafts appeared to be in the proper length orientation and  had good flow. The superior pericardial fat was closed over the aorta and  vein grafts. Two mediastinal and a left pleural chest tube were placed and  brought out through  separate incisions. The sternum was closed with interrupted steel wire. The  pectoralis fascia was closed with a running #1 Vicryl.  Subcutaneous and skin  layers were closed with a running Vicryl and sterile dressings were applied.  Total bypass time was 130 minutes with crossclamp time of 75 minutes.      PV/MEDQ  D:  08/19/2004  T:  08/19/2004  Job:  [id number]   cc:   [facility] Office   Antionette Char, MD  [address]  [place]  [place] [address]  Fax: [phone]

## 2010-11-27 NOTE — Discharge Summary (Signed)
NAMEELIGAH, ANELLO NO.:  [id number]   MEDICAL RECORD NO.:  [id number]          PATIENT TYPE:  INP   LOCATION:  2019                         FACILITY:  [facility]   PHYSICIAN:  Kerin Perna, M.D.  DATE OF BIRTH:  02/02/1946   DATE OF ADMISSION:  08/19/2004  DATE OF DISCHARGE:                                 DISCHARGE SUMMARY   ADMISSION DIAGNOSIS:  Severe three vessel coronary artery disease with class  III angina.   PAST MEDICAL HISTORY AND DISCHARGE DIAGNOSES:  1.  Irritable bowel syndrome.  2.  Degenerative arthritis with low back pain.  3.  Kidney stones.  4.  History of vertigo.  5.  Status post colonoscopy.  6.  Status post tonsillectomy and adenoidectomy.  7.  History of rib fractures.  8.  Status post circumcision.  9.  Severe three vessel coronary artery disease with class III angina status      post coronary artery bypass grafting x4.  10. Postoperative anemia, stable.  11. Newly diagnoses non-insulin dependent diabetes mellitus.   ALLERGIES:  Intolerance to codeine and Valium which cause nausea.   BRIEF HISTORY:  The patient is a 65 year old Caucasian male with a one to  two month history of mild to moderate substernal burning discomfort  associated with exercise.  There was radiation to the neck and shoulders  also present.  He had no resting symptoms.  The patient complained of  increased fatigue but no specific shortness of breath.  He also had  intermittent episodes of palpitations with his heart racing.  He underwent  an exercise tolerance test in mid January which produced a burning chest  pain and therefore the patient was scheduled for an elective outpatient  cardiac catheterization by Dr. Aleen Campi.  This was performed at the  [facility] on July 21, 2004 and revealed severe three vessel  coronary artery disease.  The ejection fraction was 50% with mild anterior  apical hypokinesis and there was no mitral regurgitation or  evidence of  aortic stenosis. An abdominal aortogram showed no significant disease with  the exception of a 20% stenosis in the right common iliac artery.  Based on  the patient's symptoms and anatomy, he was referred to Dr. Kathlee Nations Trigt  of [facility] regarding surgical revascularization.  Dr. Donata Clay evaluated the  patient in the [facility] office on August 14, 2004 and it was his opinion that  the patient should proceed with coronary artery bypass grafting.   HOSPITAL COURSE:  The patient was admitted and taken to the OR on August 19, 2004 for coronary artery bypass grafting x4.  The left internal mammary  artery was grafted to the LAD, the saphenous vein graft was grafted to the  OM, the saphenous vein graft was grafted to the PD, and saphenous vein was  grafted to the diagonal.  Endoscopic vessel harvesting was performed on the  right leg.  The patient tolerated the procedure well, was hemodynamically  stable immediately postoperatively.  The patient was extubated without  complication, woke up from anesthesia neurologically intact.  The patient  was transferred from the OR to the SICU in stable condition.   The patient's hospital course has progressed as expected with the exception  of newly diagnosed diabetes mellitus.  On postoperative day #1, the patient  was afebrile with stable vital signs and he was maintained in normal sinus  rhythm.  His chest x-ray was clear and his physical examination was  unremarkable.  The patient's invasive lines and chest tubes were  discontinued in a routine manner without complication.  The patient's CBG's  were noted to be elevated and his glucose on BMP was noted to be 212.  Diabetes coordinator evaluated the patient also on postoperative day #1 and  recommended sliding scale as well as Lantus insulin while he was an  inpatient in addition to an oral agent that the patient would need to  continue after discharge.  The patient was started on sliding  scale NovoLog  and 24 units of Lantus insulin q.h.s.  He was also started on 5 mg of  Glucotrol.  The patient's CBG's have been relatively well controlled with  this regimen throughout the remainder of the postoperative course.  He will  discharge on the Glucotrol 5 mg daily and will need to follow up with his  primary care physician, Dr. Ricki Miller after discharge.   The patient was also noted to be anemic postoperatively and was started on  an iron supplementation.  His hemoglobin and hematocrit are currently stable  at 8.8 and 24.7.   The patient has been volume overloaded postoperatively and has been diuresed  accordingly.  He began cardiac rehabilitation on postoperative day #2 and  has continued to increase his tolerance and is doing quite well.  On  postoperative day #4, the patient is without complaint.  He is afebrile with  stable vital signs and maintaining on a sinus rhythm.   PHYSICAL EXAMINATION:  CARDIAC:  A regular rate and rhythm.  LUNGS:  Clear to auscultation bilaterally.  ABDOMEN:  Benign.  The incisions are clean, dry and intact and there is 1+  pitting edema present in the right lower extremity.   The patient is in stable condition at this time and as long as he continues  to progress in the current manner, he will be ready for discharge within the  next one to two days.   LABORATORIES:  CBC on August 23, 2004:  White count 8.7, hemoglobin 8.8,  hematocrit 24.7, platelets 204,000.  BMP on August 21, 2004:  Sodium 136,  potassium 4.1, BUN 21, creatinine 1.3, glucose 185.  CBG's on August 23, 2004:  106, 84 and 135.   CONDITION ON DISCHARGE:  Improved.   INSTRUCTIONS:   MEDICATIONS:  1.  Aspirin 325 mg p.o. daily.  2.  Toprol XL 25 mg daily.  3.  Lasix 40 mg daily x3 days.  4.  K-Dur 20 mEq daily x3 days.  5.  Glucotrol 5 mg daily.  6.  Over-the-counter iron supplement 325 mg daily.  7.  Tylox one to two p.o. q.4-6h. p.r.n. pain.  ACTIVITY:  No driving  and no lifting more than 10 pounds.  The patient will  be instructed to continue daily breathing and walking exercises.   DIET:  Low salt, low fat, carbohydrate modified, medium calories.   WOUND CARE:  The patient may shower daily and clean the incisions with mild  soap and water.  If wound problems arise, the patient is instructed to  contact the [facility] office  at [phone].   FOLLOW-UP APPOINTMENT:  1.  With Dr. Aleen Campi. The patient should contact his office for an      appointment two weeks after discharge at which time he will have a chest      x-ray taken.  2.  Dr. Donata Clay three weeks after discharge.  The [facility] office will contact      the patient with the date and time of this appointment.  The patient      will be instructed to bring the chest x-ray from the appointment with      Dr. Aleen Campi to his appointment with Dr. Donata Clay.  3.  Dr. Ricki Miller.  The patient should call his office for follow up of his newly      diagnosed diabetes mellitus within one to two days after discharge.      AY/MEDQ  D:  08/23/2004  T:  08/23/2004  Job:  [id number]   cc:   Antionette Char, MD  [address]  [place]  [place] [address]  Fax: [phone]   Mikey Bussing, M.D.  [address]  [place]  [place] [address]   Juline Patch, M.D.  [address]  [place], [place] [address]  Fax: [phone]

## 2010-11-27 NOTE — Procedures (Signed)
[place]. [facility]  Patient:    [name], [name]                        MRN: [id number] Proc. Date: 02/16/00 Adm. Date:  [id number] Attending:  Sabino Gasser                           Procedure Report  PROCEDURE:  Colonoscopy.  INDICATIONS:  Hemoccult positivity.  ANESTHESIA:  None further given.  DESCRIPTION OF PROCEDURE:  With the patient mildly sedated in the left lateral decubitus position, the Olympus colonoscope was inserted into the rectum and passed under direct vision after a normal rectum examination to the cecum.  The cecum identified by ileocecal valve and appendiceal orifice, both of which were photographed.  From this point the colonoscope was slowly withdrawn taking circumferential views of the entire colonic mucosa stopping in the descending colon where there was a polyp seen, photographed and using spear cautery technique in a setting of 20/20 blunted current it was removed. Tissue was obtained and the endoscope was then withdrawn taking circumferential views of the remaining colonic mucosa stopping then in the rectum where a small polyp was seen, photographed, and removed using hot biopsy forceps technique.  Again, a setting of 20/20 blunted current.  The endoscope was then placed in retroflexion of the to view the anal canal from above, and small internal hemorrhoids were seen and photographed.  The endoscope was straightened and withdrawn.  The patients vital signs and pulse oximetry remained stable.  The patient tolerated the procedure well without apparent complications.  FINDINGS:  Diverticula seen throughout the colon but mild, but right and left colon involved, small internal hemorrhoids, polyp at rectum, polyp of descending colon.  PLAN:  Await biopsy report.  The patient will call me for results and followup with me as an outpatient. DD:  02/16/00 TD:  02/17/00 Job: [id number]

## 2010-11-27 NOTE — Procedures (Signed)
[place]. [facility]  Patient:    [name], [name]                        MRN: [id number] Proc. Date: 02/16/00 Adm. Date:  [id number] Attending:  Sabino Gasser                           Procedure Report  PROCEDURE:  Upper endoscopy.  INDICATIONS:  Hemoccult positivity.  ANESTHESIA:  Demerol 75 mg, Versed 7 mg given intravenously in divided doses.  DESCRIPTION OF PROCEDURE:  With the patient mildly sedated in the left lateral decubitus position, the Olympus videoscopic endoscope was inserted into the mouth and passed under direct vision through the esophagus which appeared normal although there was what appeared to be a hiatal hernia with possible stricture formation.  There was no evidence of Barretts.  This was photographed.  We entered in the stomach.  Fundus, body, antrum were approached and in the antrum there were some small ulcerations photographed. We entered in the duodenal bulb where duodenitis was seen and photographed. The second portion duodenum appeared normal and was photographed.  From this point the endoscope was slowly withdrawn, taking circumferential views of the entire duodenal mucosa through the endoscope then pulled in the stomach, placed in retroflexion to view the stomach from the below, and this was photographed after biopsy.  After straightening the endoscope and taking biopsies of these antral ulcers, the endoscope was slowly withdrawn taking circumferential views of the remaining gastric and esophageal mucosa which otherwise appeared normal.  The patients vital signs and pulse oximetry remained stable.  The patient tolerated the procedure well without apparently complications.  FINDINGS:  Duodenitis of bulb and small ulcer in the antrum, biopsied.  PLAN: 1. Await biopsy report. 2. The patient will call me for results, followup with me as an outpatient, 3. Proceed to colonoscopy. DD:  02/16/00 TD:  02/17/00 Job:  [id number]

## 2010-11-27 NOTE — Op Note (Signed)
NAMEKATIE, MOCH                 ACCOUNT NO.:  [id number]   MEDICAL RECORD NO.:  [id number]          PATIENT TYPE:  AMB   LOCATION:  ENDO                         FACILITY:  [facility]   PHYSICIAN:  Georgiana Spinner, M.D.    DATE OF BIRTH:  Nov 20, 1945   DATE OF PROCEDURE:  DATE OF DISCHARGE:                               OPERATIVE REPORT   PROCEDURE:  Upper endoscopy.   INDICATIONS:  Abdominal pain.   ANESTHESIA:  Demerol 70 mg, Versed 7.5 mg.   PROCEDURE:  With the patient mildly sedated in the left lateral  decubitus position, the Pentax videoscopic endoscope was inserted in the  mouth and passed under direct vision through the esophagus which  appeared normal into the stomach.  Fundus, body, antrum, duodenal bulb,  second portion of the duodenum were visualized.  From this point, the  endoscope was slowly withdrawn taking circumferential views of duodenal  mucosa until the endoscope had been pulled back into stomach, placed in  retroflexion to view the stomach from below, and in retroflexed view, we  biopsied the distal esophagus to rule out Barrett's esophagus.  The  endoscope was straightened and withdrawn.  The patient's vital signs and  pulse oximeter remained stable.  The patient tolerated the procedure  well without apparent complications.   FINDINGS:  Hiatal hernia with question of Barrett's esophagus.  Await  biopsy report.  The patient will call me for results and follow up with  me as an outpatient.  Proceed to colonoscopy as planned.           ______________________________  Georgiana Spinner, M.D.     GMO/MEDQ  D:  10/26/2006  T:  10/26/2006  Job:  [id number]

## 2010-11-27 NOTE — Op Note (Signed)
NAME:  [name], SLITER NO.:  [id number]   MEDICAL RECORD NO.:  [id number]                   PATIENT TYPE:  AMB   LOCATION:  ENDO                                 FACILITY:  [facility]   PHYSICIAN:  Georgiana Spinner, M.D.                 DATE OF BIRTH:  [birth year]   DATE OF PROCEDURE:  01/28/2004  DATE OF DISCHARGE:                                 OPERATIVE REPORT   PROCEDURE:  Colonoscopy with biopsy and polypectomy.   INDICATIONS:  Colon polyps.   ANESTHESIA:  Demerol 80 mg, Versed 8 mg.   PROCEDURE:  With the patient mildly sedated in the left lateral decubitus  position, the Olympus videoscopic colonoscope was inserted in the rectum  after a formal rectal examination revealed a normal prostate.  Subsequently  the colonoscope was advanced under direct vision through a tortuous sigmoid  colon that was diverticula-filled.  We reached the cecum, identified by the  appendiceal orifice and the ileocecal valve, both of which were  photographed.  From this point the colonoscope was slowly withdrawn, taking  circumferential views of the colonic mucosa and as we pulled back to the  rectum stopped first in the cecum, where there was a large polyp seen and  removed using snare cautery technique and 20/200 __________ pulse generator.  Tissue was suctioned through the endoscope.  We next stopped in the proximal  transverse colon, where a second polyp was seen.  It was photographed, and  was removed, again using snare cautery technique.  A third polyp was seen in  this same area, and it was removed using hot biopsy forceps technique, all  with the same setting of 20/200 blended current.  We next stopped in the  descending colon and sigmoid, where three polyps were fairly adjacent to  each other, and they were all removed using hot biopsy forceps technique.  All tissue was retrieved.  The endoscope was withdrawn into the rectum,  which appeared normal on direct and showed  hemorrhoids on retroflexed view.  The endoscope was straightened and withdrawn.  The patient's vital signs and  pulse oximetry remained stable. The patient tolerated the procedure well  without apparent complications.   FINDINGS:  1. Normal prostate on rectal examination.  2. Polyps of the cecum, transverse colon, descending/sigmoid colon.  3. Diverticulosis with thickening of the sigmoid colon.  4. Internal hemorrhoids.   PLAN:  Await biopsy report.  The patient will call me for results and follow  up with me as an outpatient.                                               Georgiana Spinner, M.D.    GMO/MEDQ  D:  01/28/2004  T:  01/28/2004  Job:  [id number] 

## 2010-11-27 NOTE — Op Note (Signed)
NAMEPRAVEEN, COIA                 ACCOUNT NO.:  [id number]   MEDICAL RECORD NO.:  [id number]          PATIENT TYPE:  AMB   LOCATION:  ENDO                         FACILITY:  [facility]   PHYSICIAN:  Georgiana Spinner, M.D.    DATE OF BIRTH:  [birth year]   DATE OF PROCEDURE:  10/26/2006  DATE OF DISCHARGE:                               OPERATIVE REPORT   PROCEDURE:  Colonoscopy.   INDICATIONS:  Colon polyps, rectal bleeding.   ANESTHESIA:  None further given.   PROCEDURE:  With the patient mildly sedated in the left lateral  decubitus position, the Pentax videoscopic colonoscope was inserted in  the rectum and passed under direct vision to the cecum, identified by  the ileocecal valve and appendiceal orifice, both of which were  photographed.  From this point, the colonoscope was slowly withdrawn,  taking circumferential views of colonic mucosa, stopping in the  descending colon where a small polyp was seen and removed using hot  biopsy forceps technique.  We next stopped in the sigmoid colon, where  the colon appeared thickened and it may have been normal mucosa, but I  elected to biopsy it.  It was in the area of the significant  diverticulosis.  We next stopped at approximately 20 cm from the anal  verge, at which point two small polyps were seen and removed.  One with  snare cautery technique, the other with hot biopsy forceps technique.  All with the setting of 20/200 blended current.  We then stopped in the  rectum, which appeared normal on direct, showed hemorrhoids on  retroflexed view.  The endoscope was straightened and withdrawn.  The  patient's vital signs and pulse oximeter remained stable.  The patient  tolerated the procedure well without apparent complications.   FINDINGS:  Polyps at the ascending colon and 20 cm from the anal verge.  Internal hemorrhoids and thickening of the mucosa near the diverticula  in the sigmoid colon.  Await biopsy reports.  The patient will call  me  for results and follow up with me as an outpatient.           ______________________________  Georgiana Spinner, M.D.     GMO/MEDQ  D:  10/26/2006  T:  10/26/2006  Job:  [id number]

## 2011-03-17 ENCOUNTER — Encounter: Payer: Self-pay | Admitting: Vascular Surgery

## 2011-04-21 LAB — COMPREHENSIVE METABOLIC PANEL
ALT: 17
AST: 38 — ABNORMAL HIGH
Albumin: 3.6
Alkaline Phosphatase: 67
BUN: 12
CO2: 24
Calcium: 9
Chloride: 108
Creatinine, Ser: 0.89
GFR calc Af Amer: >60
GFR calc non Af Amer: >60
Glucose, Bld: 148 — ABNORMAL HIGH
Potassium: 3.6
Sodium: 140
Total Bilirubin: 0.4
Total Protein: 6.1

## 2011-04-21 LAB — ABO/RH: ABO/RH(D): A POS

## 2011-04-21 LAB — PROTIME-INR
INR: 1.1
Prothrombin Time: 14.4

## 2011-04-21 LAB — CROSSMATCH
ABO/RH(D): A POS
Antibody Screen: NEGATIVE

## 2011-04-21 LAB — BASIC METABOLIC PANEL
BUN: 9
CO2: 24
Calcium: 8.5
Chloride: 104
Creatinine, Ser: 0.88
GFR calc Af Amer: >60
GFR calc non Af Amer: >60
Glucose, Bld: 130 — ABNORMAL HIGH
Potassium: 3.5
Sodium: 135

## 2011-04-21 LAB — DIFFERENTIAL
Basophils Absolute: 0
Basophils Relative: 0
Eosinophils Absolute: 0.1
Eosinophils Relative: 2
Lymphocytes Relative: 25
Lymphs Abs: 2.2
Monocytes Absolute: 0.8 — ABNORMAL HIGH
Monocytes Relative: 10
Neutro Abs: 5.4
Neutrophils Relative %: 63

## 2011-04-21 LAB — CBC
HCT: 41
HCT: 41.8
Hemoglobin: 14.1
Hemoglobin: 14.4
MCHC: 34.4
MCHC: 34.4
MCV: 85.1
MCV: 86.4
Platelets: 167
Platelets: 188
RBC: 4.82
RBC: 4.84
RDW: 12.5
RDW: 12.7
WBC: 7.2
WBC: 8.6

## 2011-04-21 LAB — APTT: aPTT: 34

## 2011-04-27 ENCOUNTER — Ambulatory Visit: Payer: Medicare Other | Admitting: Vascular Surgery

## 2011-06-07 ENCOUNTER — Encounter: Payer: Self-pay | Admitting: Vascular Surgery

## 2011-06-08 ENCOUNTER — Encounter: Payer: Self-pay | Admitting: Vascular Surgery

## 2011-06-08 ENCOUNTER — Ambulatory Visit (INDEPENDENT_AMBULATORY_CARE_PROVIDER_SITE_OTHER): Payer: Medicare Other | Admitting: Vascular Surgery

## 2011-06-08 ENCOUNTER — Ambulatory Visit
Admission: RE | Admit: 2011-06-08 | Discharge: 2011-06-08 | Disposition: A | Discharge status: Home / Self Care | Payer: Medicare Other | Source: Ambulatory Visit | Attending: Vascular Surgery | Admitting: Vascular Surgery

## 2011-06-08 VITALS — BP 160/79 | HR 99 | Resp 20 | Ht 69.0 in | Wt 195.0 lb

## 2011-06-08 DIAGNOSIS — I714 Abdominal aortic aneurysm, without rupture, unspecified: Secondary | ICD-10-CM

## 2011-06-08 MED ORDER — IOHEXOL 350 MG/ML SOLN
100.0000 mL | Freq: Once | INTRAVENOUS | Status: AC | PRN
  Administered 2011-06-08: 100 mL via INTRAVENOUS

## 2011-06-08 NOTE — Progress Notes (Signed)
Subjective:     Patient ID: [name], male   DOB: [birth year], 65 y.o.   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 This was performed in August of 2011. He had an aorto to right external iliac and left common iliac Gore stent graft placed. He denies any abdominal or back symptoms at the present time other than some chronic musculoskeletal issues with his back. He did have a small lymphocele in the right inguinal area initially which has resolved. He is able to walk several blocks without claudication type symptoms.  Past Medical History  Diagnosis Date  . Diabetes mellitus   . Hyperlipidemia   . Hypertension   . Myocardial infarction 2006  . GERD (gastroesophageal reflux disease)   . Joint pain   . Dizziness   . CAD (coronary artery disease)     History  Substance Use Topics  . Smoking status: Never Smoker   . Smokeless tobacco: Never Used  . Alcohol Use: No    No family history on file.  Allergies  Allergen Reactions  . Codeine   . Valium     Current outpatient prescriptions:aspirin 81 MG tablet, Take 81 mg by mouth daily.  , Disp: , Rfl: ;  insulin glargine (LANTUS) 100 UNIT/ML injection, Inject into the skin at bedtime.  , Disp: , Rfl: ;  metoprolol succinate (TOPROL-XL) 25 MG 24 hr tablet, Take 25 mg by mouth daily.  , Disp: , Rfl: ;  omeprazole (PRILOSEC) 10 MG capsule, Take 10 mg by mouth daily.  , Disp: , Rfl:  simvastatin (ZOCOR) 20 MG tablet, Take 20 mg by mouth at bedtime.  , Disp: , Rfl: ;  temazepam (RESTORIL) 15 MG capsule, Take 15 mg by mouth at bedtime as needed.  , Disp: , Rfl:  No current facility-administered medications for this visit. Facility-Administered Medications Ordered in Other Visits: iohexol (OMNIPAQUE) 350 MG/ML injection 100 mL, 100 mL, Intravenous, Once PRN, Medication Radiologist, 100 mL at 06/08/11 1205  BP 160/79  Pulse  99  Resp 20  Ht 5\' 9"  (1.753 m)  Wt 195 lb (88.451 kg)  BMI 28.80 kg/m2  Body mass index is 28.80 kg/(m^2).         Review of Systems denies chest pain, dyspnea on exertion, PND, orthopnea. Does have a history of GI bleeding due to diverticulosis which has happened on 4 occasions. Complains of occasional numbness in his medial thigh areas as well as occasional hematuria. All other systems are negative     Objective:   Physical Exam 160/79 heart rate 99 respirations 20 General he is alert and oriented x3 in no apparent distress HEENT normal for age Lungs no rhonchi or wheezing Cardiovascular regular rhythm no murmurs carotid pulses 3+ no audible bruits Abdomen soft nontender with no pulsatile mass noted Lower extremity exam reveals 3+ femoral and dorsalis pedis pulses palpable bilaterally Neurologic exam normal   Today I ordered a CT angiogram which are reviewed by computer. The stent graft is in Position with no evidence of endoleak. The right common iliac artery aneurysm is thrombosed and has decreased from 3.3 cm to 2.5 cm in diameter.    Assessment:     Doing well post insertion and aortobiiliac stent graft 4 right common iliac artery aneurysm-performed August 2011    Plan:    return in one year with duplex scan of aortic stent graft in  our office   at time of followup appoint

## 2012-06-02 ENCOUNTER — Other Ambulatory Visit: Payer: Self-pay | Admitting: *Deleted

## 2012-06-02 DIAGNOSIS — Z48812 Encounter for surgical aftercare following surgery on the circulatory system: Secondary | ICD-10-CM

## 2012-06-02 DIAGNOSIS — I739 Peripheral vascular disease, unspecified: Secondary | ICD-10-CM

## 2012-06-13 ENCOUNTER — Ambulatory Visit: Payer: Medicare Other | Admitting: Neurosurgery

## 2012-06-13 ENCOUNTER — Other Ambulatory Visit: Payer: Medicare Other

## 2012-07-24 ENCOUNTER — Encounter: Payer: Self-pay | Admitting: Neurosurgery

## 2012-07-25 ENCOUNTER — Encounter: Payer: Self-pay | Admitting: Neurosurgery

## 2012-07-25 ENCOUNTER — Other Ambulatory Visit (INDEPENDENT_AMBULATORY_CARE_PROVIDER_SITE_OTHER): Payer: Medicare Other | Admitting: *Deleted

## 2012-07-25 ENCOUNTER — Ambulatory Visit (INDEPENDENT_AMBULATORY_CARE_PROVIDER_SITE_OTHER): Payer: Medicare Other | Admitting: Neurosurgery

## 2012-07-25 VITALS — BP 140/90 | HR 71 | Resp 16 | Ht 69.0 in | Wt 187.9 lb

## 2012-07-25 DIAGNOSIS — Z48812 Encounter for surgical aftercare following surgery on the circulatory system: Secondary | ICD-10-CM

## 2012-07-25 DIAGNOSIS — I723 Aneurysm of iliac artery: Secondary | ICD-10-CM | POA: Insufficient documentation

## 2012-07-25 DIAGNOSIS — I739 Peripheral vascular disease, unspecified: Secondary | ICD-10-CM

## 2012-07-25 NOTE — Addendum Note (Signed)
Addended by: Sharee Pimple on: 07/25/2012 10:54 AM   Modules accepted: Orders

## 2012-07-25 NOTE — Progress Notes (Signed)
[facility] [place] AAA/Carotid Office Note  CC: AAA surveillance Referring Physician: Hart Rochester  History of Present Illness: 67 year old male patient of Dr. Hart Rochester status post aortic stent graft for right common iliac artery aneurysm, the patient also has a aorta to right internal iliac and left common iliac stent graft from August 2011, the patient denies any unusual abdominal or back pain states he has no claudication or rest pain.  Past Medical History  Diagnosis Date  . Diabetes mellitus   . Hyperlipidemia   . Hypertension   . Myocardial infarction 2006  . GERD (gastroesophageal reflux disease)   . Joint pain   . Dizziness   . CAD (coronary artery disease)   . Iliac artery aneurysm     ROS: [x]  Positive   [ ]  Denies    General: [ ]  Weight loss, [ ]  Fever, [ ]  chills Neurologic: [ ]  Dizziness, [ ]  Blackouts, [ ]  Seizure [ ]  Stroke, [ ]  "Mini stroke", [ ]  Slurred speech, [ ]  Temporary blindness; [ ]  weakness in arms or legs, [ ]  Hoarseness Cardiac: [ ]  Chest pain/pressure, [ ]  Shortness of breath at rest [ ]  Shortness of breath with exertion, [ ]  Atrial fibrillation or irregular heartbeat Vascular: [ ]  Pain in legs with walking, [ ]  Pain in legs at rest, [ ]  Pain in legs at night,  [ ]  Non-healing ulcer, [ ]  Blood clot in vein/DVT,   Pulmonary: [ ]  Home oxygen, [ ]  Productive cough, [ ]  Coughing up blood, [ ]  Asthma,  [ ]  Wheezing Musculoskeletal:  [ ]  Arthritis, [ ]  Low back pain, [ ]  Joint pain Hematologic: [ ]  Easy Bruising, [ ]  Anemia; [ ]  Hepatitis Gastrointestinal: [ ]  Blood in stool, [ ]  Gastroesophageal Reflux/heartburn, [ ]  Trouble swallowing Urinary: [ ]  chronic Kidney disease, [ ]  on HD - [ ]  MWF or [ ]  TTHS, [ ]  Burning with urination, [ ]  Difficulty urinating Skin: [ ]  Rashes, [ ]  Wounds Psychological: [ ]  Anxiety, [ ]  Depression   Social History History  Substance Use Topics  . Smoking status: Never Smoker   . Smokeless tobacco: Never  Used  . Alcohol Use: No    Family History History reviewed. No pertinent family history.  Allergies  Allergen Reactions  . Codeine Nausea Only    Cannot take on empty stomach.  . Valium Nausea Only    Cannot take on empty stomach.    Current Outpatient Prescriptions  Medication Sig Dispense Refill  . aspirin 81 MG tablet Take 81 mg by mouth daily.        . insulin glargine (LANTUS) 100 UNIT/ML injection Inject into the skin at bedtime.        . metoprolol succinate (TOPROL-XL) 25 MG 24 hr tablet Take 25 mg by mouth daily.        . metoprolol tartrate (LOPRESSOR) 25 MG tablet       . omeprazole (PRILOSEC) 10 MG capsule Take 10 mg by mouth daily.        . ONE TOUCH ULTRA TEST test strip       . simvastatin (ZOCOR) 20 MG tablet Take 20 mg by mouth at bedtime.        . temazepam (RESTORIL) 15 MG capsule Take 15 mg by mouth at bedtime as needed.          Physical Examination  Filed Vitals:   07/25/12 0933  BP: 140/90  Pulse: 71  Resp: 16  Body mass index is 27.75 kg/(m^2).  General:  WDWN in NAD Gait: Normal HEENT: WNL Eyes: Pupils equal Pulmonary: normal non-labored breathing , without Rales, rhonchi,  wheezing Cardiac: RRR, without  Murmurs, rubs or gallops; Abdomen: soft, NT, no masses Skin: no rashes, ulcers noted  Vascular Exam Pulses: 3+ radial pulses bilaterally, palpable lower extremity pulses bilaterally Carotid bruits: Carotid pulses to auscultation no bruits are heard Extremities without ischemic changes, no Gangrene , no cellulitis; no open wounds;  Musculoskeletal: no muscle wasting or atrophy   Neurologic: A&O X 3; Appropriate Affect ; SENSATION: normal; MOTOR FUNCTION:  moving all extremities equally. Speech is fluent/normal  Non-Invasive Vascular Imaging Duplex today shows a maximum diameter of 2.48 in the aorta proximally with no evidence of endoleak  ASSESSMENT/PLAN: Asymptomatic patient status post aortic stent graft for right common iliac  artery aneurysm and I aorta to right external iliac and left common iliac stent graft. Patient will followup in one year with repeat aortoiliac duplex. The patient's questions were encouraged and answered, he is in agreement with this plan.  Lauree Chandler ANP   Clinic MD: Hart Rochester

## 2013-01-03 ENCOUNTER — Encounter: Payer: Self-pay | Admitting: Cardiovascular Disease

## 2013-07-12 DIAGNOSIS — M545 Low back pain, unspecified: Secondary | ICD-10-CM

## 2013-07-12 HISTORY — DX: Low back pain, unspecified: M54.50

## 2013-07-30 ENCOUNTER — Encounter: Payer: Self-pay | Admitting: Family

## 2013-07-31 ENCOUNTER — Other Ambulatory Visit (HOSPITAL_COMMUNITY): Payer: Medicare Other

## 2013-07-31 ENCOUNTER — Ambulatory Visit (HOSPITAL_COMMUNITY)
Admission: RE | Admit: 2013-07-31 | Discharge: 2013-07-31 | Disposition: A | Discharge status: Home / Self Care | Payer: Medicare Other | Source: Ambulatory Visit | Attending: Family | Admitting: Family

## 2013-07-31 ENCOUNTER — Ambulatory Visit: Payer: Medicare Other | Admitting: Neurosurgery

## 2013-07-31 ENCOUNTER — Other Ambulatory Visit: Payer: Medicare Other

## 2013-07-31 ENCOUNTER — Encounter: Payer: Self-pay | Admitting: Family

## 2013-07-31 ENCOUNTER — Ambulatory Visit: Payer: Medicare Other | Admitting: Family

## 2013-07-31 ENCOUNTER — Ambulatory Visit (INDEPENDENT_AMBULATORY_CARE_PROVIDER_SITE_OTHER): Payer: Medicare Other | Admitting: Family

## 2013-07-31 VITALS — BP 157/94 | HR 75 | Resp 16 | Ht 69.0 in | Wt 187.0 lb

## 2013-07-31 DIAGNOSIS — Z48812 Encounter for surgical aftercare following surgery on the circulatory system: Secondary | ICD-10-CM

## 2013-07-31 DIAGNOSIS — I723 Aneurysm of iliac artery: Secondary | ICD-10-CM | POA: Insufficient documentation

## 2013-07-31 DIAGNOSIS — I714 Abdominal aortic aneurysm, without rupture, unspecified: Secondary | ICD-10-CM | POA: Insufficient documentation

## 2013-07-31 NOTE — Patient Instructions (Signed)

## 2013-07-31 NOTE — Progress Notes (Signed)
[facility] [place] HISTORY AND PHYSICAL   MRN : [id number]  History of Present Illness:   [name] is a 68 y.o. male pt of Dr. 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 This was performed in August of 2011. He had an aorto to right external iliac and left common iliac Gore stent graft placed. 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    Pt. denies claudication in legs, denies non healing wounds, denies history of stroke or TIA symptoms.  States he walks a great deal, does not use ETOH.  Pt Diabetic: Yes, A1C June, 2014 was 6.9, good control Pt smoker: smoker  (occasional cigar)  Current Outpatient Prescriptions  Medication Sig Dispense Refill  . aspirin 81 MG tablet Take 81 mg by mouth daily.        . insulin glargine (LANTUS) 100 UNIT/ML injection Inject into the skin at bedtime.        . metoprolol succinate (TOPROL-XL) 25 MG 24 hr tablet Take 25 mg by mouth daily.        . metoprolol tartrate (LOPRESSOR) 25 MG tablet       . omeprazole (PRILOSEC) 10 MG capsule Take 10 mg by mouth daily.        . ONE TOUCH ULTRA TEST test strip       . simvastatin (ZOCOR) 20 MG tablet Take 20 mg by mouth at bedtime.        . temazepam (RESTORIL) 15 MG capsule Take 15 mg by mouth at bedtime as needed.         No current facility-administered medications for this visit.    Pt meds include: Statin :Yes Betablocker: Yes ASA: Yes Other anticoagulants/antiplatelets: no  Past Medical History  Diagnosis Date  . Diabetes mellitus   . Hyperlipidemia   . Hypertension   . Myocardial infarction 2006  . GERD (gastroesophageal reflux disease)   . Joint pain   . Dizziness   . CAD (coronary artery  disease)   . Iliac artery aneurysm     Past Surgical History  Procedure Laterality Date  . Coronary artery bypass graft  2006    Social History History  Substance Use Topics  . Smoking status: Never Smoker   . Smokeless tobacco: Never Used  . Alcohol Use: No    Family History Family History  Problem Relation Age of Onset  . Diabetes Mother   . Hypertension Mother   . Heart disease Mother     AAA  . Heart disease Sister   . Heart disease Son     Heart Disease before age 11     Allergies  Allergen Reactions  . Codeine Nausea Only    Cannot take on empty stomach.  . Valium Nausea Only    Cannot take on empty stomach.     REVIEW OF SYSTEMS: See HPI for pertinent positives and negatives.  Physical Examination Filed Vitals:   07/31/13 1147  BP: 157/94  Pulse: 75  Resp: 16   Filed Weights   07/31/13 1147  Weight: 187 lb (84.823 kg)   Body mass index is 27.6 kg/(m^2).  General:  WDWN in NAD Gait: Normal HENT: WNL Eyes: Pupils equal  Pulmonary: normal non-labored breathing , without Rales, rhonchi,  wheezing Cardiac: RRR, without detected  Murmurs.  Abdomen: soft, NT, no masses Skin: no rashes, ulcers noted;  no Gangrene , no cellulitis; no open wounds;   VASCULAR EXAM  Carotid Bruits Left Right   Negative Negative                             VASCULAR EXAM: Extremities without ischemic changes  without Gangrene; without open wounds.                                                                                                          LE Pulses LEFT RIGHT       FEMORAL   palpable   palpable        POPLITEAL  not palpable   not palpable       POSTERIOR TIBIAL   palpable    palpable        DORSALIS PEDIS      ANTERIOR TIBIAL  palpable   palpable     Musculoskeletal: no muscle wasting or atrophy; no edema  Neurologic: A&O X 3; Appropriate Affect ;  SENSATION: normal; MOTOR FUNCTION: 5/5 Symmetric, CN 2-12 intact Speech is  fluent/normal  Non-Invasive Vascular Imaging(07/31/2013):  Abdominal Aorta Duplex:   DIAMETER AP (cm) DIAMETER TRANSVERSE (cm) VELOCITIES (cm/sec)  Aorta 2.5 2.6 89  Right Common Iliac 2.0 2.0 66  Left Common Iliac 1.1 1.1 74    Comparison Study  Ultrasound     Date DIAMETER AP (cm) DIAMETER TRANSVERSE (cm)  07/25/12 2.13 (Right CIA)      ADDITIONAL FINDINGS: Decreased visualization of the abdominal vasculature due to overlying bowel gas. Biphasic Doppler waveforms noted throughout the bilateral iliac arteries.     IMPRESSION: Patent stent of the abdominal aorta and iliac arteries with a maximum diameter measurements, as described above.     Compared to the previous exam:  No significant change in the maximum diameter of the right common iliac artery when compared to the previous exam. The distal aorta measurement was not acquired on the previous exam.         ASSESSMENT:  [name] is a 68 y.o. male pt of Dr. 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 This was performed in August of 2011. He had an aorto to right external iliac and left common iliac Gore stent graft placed. Stable and small aorta sac size s/p EVAR, and stable right CIA aneurysm. No claudication symptoms, no new back pain; he has chronic lumbar area back pain.  PLAN:   Based on today's exam and Duplex results, and after discussing with Dr. Kellie Simmering, pt advised to return in 1 year for EVAR Duplex. I discussed in depth with the patient the nature of atherosclerosis, and emphasized the importance of maximal medical management including strict control of blood pressure, blood glucose, and lipid levels, obtaining regular exercise, and cessation of smoking.  The patient is aware that without maximal medical management the underlying atherosclerotic disease process will progress, limiting the benefit of any interventions.  The  patient was given information about AAA including signs, symptoms, treatment,  what symptoms should prompt the patient to seek immediate medical care, and how to minimize the risk of enlargement and rupture of aneurysms.  Thank you for allowing [place] to participate in this patient's care.  Clemon Chambers, RN, MSN, FNP-C [facility] Office: [phone]  Clinic MD: Kellie Simmering 07/31/2013 9:22 AM

## 2013-08-08 ENCOUNTER — Other Ambulatory Visit: Payer: Self-pay | Admitting: *Deleted

## 2013-08-16 ENCOUNTER — Ambulatory Visit (INDEPENDENT_AMBULATORY_CARE_PROVIDER_SITE_OTHER): Payer: Medicare Other | Admitting: Cardiovascular Disease

## 2013-08-16 ENCOUNTER — Encounter: Payer: Self-pay | Admitting: *Deleted

## 2013-08-16 ENCOUNTER — Encounter: Payer: Self-pay | Admitting: Cardiovascular Disease

## 2013-08-16 VITALS — BP 118/60 | HR 63 | Ht 69.0 in | Wt 189.5 lb

## 2013-08-16 DIAGNOSIS — I2510 Atherosclerotic heart disease of native coronary artery without angina pectoris: Secondary | ICD-10-CM

## 2013-08-16 DIAGNOSIS — E118 Type 2 diabetes mellitus with unspecified complications: Secondary | ICD-10-CM

## 2013-08-16 DIAGNOSIS — E1140 Type 2 diabetes mellitus with diabetic neuropathy, unspecified: Secondary | ICD-10-CM | POA: Insufficient documentation

## 2013-08-16 DIAGNOSIS — I25810 Atherosclerosis of coronary artery bypass graft(s) without angina pectoris: Secondary | ICD-10-CM | POA: Insufficient documentation

## 2013-08-16 DIAGNOSIS — Z79899 Other long term (current) drug therapy: Secondary | ICD-10-CM

## 2013-08-16 DIAGNOSIS — E782 Mixed hyperlipidemia: Secondary | ICD-10-CM

## 2013-08-16 HISTORY — DX: Atherosclerotic heart disease of native coronary artery without angina pectoris: I25.10

## 2013-08-16 HISTORY — DX: Type 2 diabetes mellitus with unspecified complications: E11.8

## 2013-08-16 HISTORY — DX: Mixed hyperlipidemia: E78.2

## 2013-08-16 MED ORDER — SIMVASTATIN 40 MG PO TABS: 40.0000 mg | ORAL_TABLET | Freq: Every day | ORAL | Status: DC

## 2013-08-16 NOTE — Patient Instructions (Signed)
Increase Simvastatin to 40mg  daily.  A new prescription has been sent to your pharmacy.  Your physician recommends that you return for lab work in: [place].  Your physician recommends that you schedule a follow-up appointment in: ONE YEAR.

## 2013-08-16 NOTE — Progress Notes (Signed)
Patient ID: [name], male   DOB: 02/23/1946, 67 y.o.   MRN: [id number]      Reason for office visit CAD  This is a [name]'s first visit to our office in a couple of years. He has a history of coronary disease as well as peripheral arterial disease. He underwent four-vessel bypass surgery in 2006 and has not had coronary problems since. A nuclear stress test in 2011 was normal. He has normal left ventricular systolic function. He has received a stent in the large right common iliac artery aneurysm and is monitored by Dr. Lawson. He has insulin requiring diabetes mellitus and his hemoglobin A1c has consistently been around 6.9%. He has chronically low HDL cholesterol , around 27 mg/dL. Over the last year his LDL has oscillated between 70 and 100 mg/dL, Triglycerides are typically over 200. He has no complaints.   Allergies  Allergen Reactions  . Codeine Nausea Only    Cannot take on empty stomach.  . Valium Nausea Only    Cannot take on empty stomach.    Current Outpatient Prescriptions  Medication Sig Dispense Refill  . aspirin 81 MG tablet Take 81 mg by mouth daily.        . insulin glargine (LANTUS) 100 UNIT/ML injection Inject into the skin at bedtime.        . metoprolol tartrate (LOPRESSOR) 25 MG tablet Take 25 mg by mouth daily.       . omeprazole (PRILOSEC) 10 MG capsule Take 10 mg by mouth daily.        . ONE TOUCH ULTRA TEST test strip        No current facility-administered medications for this visit.    Past Medical History  Diagnosis Date  . Diabetes mellitus   . Hyperlipidemia   . Hypertension   . Myocardial infarction 2006  . GERD (gastroesophageal reflux disease)   . Joint pain   . Dizziness   . CAD (coronary artery disease)   . Iliac artery aneurysm   . Lower back pain Jan 2015  . Atrial fibrillation   . CAD s/p CABG 2006 08/16/2013    Dr vanTrigt - to LAD, SVG to diagonal, SVG to OM, SVG to PDA   . Mixed hyperlipidemia 08/16/2013  . DM type 2 causing  complication 08/16/2013    Past Surgical History  Procedure Laterality Date  . Coronary artery bypass graft  2006    LIMA to LAD,SVG to diagonal,SVG to obtuse marginal,SVG to posterior descending  . Iliac artery stent Left Aug. 10, 2011    Aorta to right ext iliac and left CIA stent repair   . Nm myocar perf wall motion  11/18/2009    low risk    Family History  Problem Relation Age of Onset  . Diabetes Mother   . Hypertension Mother   . Heart disease Mother     AAA  . Heart disease Sister   . Heart disease Son     Heart Disease before age 60    History   Social History  . Marital Status: Married    Spouse Name: N/A    Number of Children: N/A  . Years of Education: N/A   Occupational History  . Not on file.   Social History Main Topics  . Smoking status: Never Smoker   . Smokeless tobacco: Never Used  . Alcohol Use: No  . Drug Use: No  . Sexual Activity: Not on file   Other Topics Concern  .   Not on file   Social History Narrative  . No narrative on file    Review of systems: The patient specifically denies any chest pain at rest or with exertion, dyspnea at rest or with exertion, orthopnea, paroxysmal nocturnal dyspnea, syncope, palpitations, focal neurological deficits, intermittent claudication, lower extremity edema, unexplained weight gain, cough, hemoptysis or wheezing.  The patient also denies abdominal pain, nausea, vomiting, dysphagia, diarrhea, constipation, polyuria, polydipsia, dysuria, hematuria, frequency, urgency, abnormal bleeding or bruising, fever, chills, unexpected weight changes, mood swings, change in skin or hair texture, change in voice quality, auditory or visual problems, allergic reactions or rashes, new musculoskeletal complaints other than usual "aches and pains".   PHYSICAL EXAM BP 118/60  Pulse 63  Ht 5' 9" (1.753 m)  Wt 85.957 kg (189 lb 8 oz)  BMI 27.97 kg/m2  General: Alert, oriented x3, no distress Head: no evidence of  trauma, PERRL, EOMI, no exophtalmos or lid lag, no myxedema, no xanthelasma; normal ears, nose and oropharynx Neck: normal jugular venous pulsations and no hepatojugular reflux; brisk carotid pulses without delay and no carotid bruits Chest: clear to auscultation, no signs of consolidation by percussion or palpation, normal fremitus, symmetrical and full respiratory excursions, sternotomy scar Cardiovascular: normal position and quality of the apical impulse, regular rhythm, normal first and second heart sounds, no murmurs, rubs or gallops Abdomen: no tenderness or distention, no masses by palpation, no abnormal pulsatility or arterial bruits, normal bowel sounds, no hepatosplenomegaly Extremities: no clubbing, cyanosis or edema; 2+ radial, ulnar and brachial pulses bilaterally; 2+ right femoral, posterior tibial and dorsalis pedis pulses; 2+ left femoral, posterior tibial and dorsalis pedis pulses; no subclavian or femoral bruits Neurological: grossly nonfocal   EKG: Normal sinus rhythm, rightward axis, no change  Lipid Panel July 26, 2013 (Dr. Minna Antis) Total cholesterol 173, HDL 27, LDL 99, triglycerides 234 Hemoglobin A1c 6.8% Creatinine 0.93 normal LFTs  BMET    Component Value Date/Time   NA 138 02/20/2010 0440   K 3.8 02/20/2010 0440   CL 105 02/20/2010 0440   CO2 28 02/20/2010 0440   GLUCOSE 138* 02/20/2010 0440   BUN 8 02/20/2010 0440   CREATININE 1.09 02/20/2010 0440   CALCIUM 8.3* 02/20/2010 0440   GFRNONAA >60 02/20/2010 0440   GFRAA  Value: >60        The eGFR has been calculated using the MDRD equation. This calculation has not been validated in all clinical situations. eGFR's persistently <60 mL/min signify possible Chronic Kidney Disease. 02/20/2010 0440     ASSESSMENT AND PLAN  [name] does not have any signs or symptoms of active coronary insufficiency. The focuses on risk factor modification. Glycemic control is fair. His blood pressure is excellent. The major problem is his  lipid profile. His LDL cholesterol has risen and his HDL remains very low. History was rather high in the overall impression is that he probably had a small dense LDL particles which place him at high risk for vascular disease. I think we should increase his simvastatin to 40 mg/dL, shooting for an LDL level of 70 mg/dL or less. In addition I would recommend that in 3 months we recheck a more complex lipid profile, to assess LDL particle number.  Orders Placed This Encounter  Procedures  . EKG 12-Lead     Rolf Fells  Sanda Klein, MD, [facility] [phone] office [phone] pager

## 2014-02-01 ENCOUNTER — Encounter (HOSPITAL_COMMUNITY): Payer: Self-pay | Admitting: Emergency Medicine

## 2014-02-01 ENCOUNTER — Observation Stay (HOSPITAL_COMMUNITY)
Admission: EM | Admit: 2014-02-01 | Discharge: 2014-02-03 | Disposition: A | Discharge status: Home / Self Care | Payer: Medicare Other | Attending: Internal Medicine | Admitting: Internal Medicine

## 2014-02-01 ENCOUNTER — Encounter: Payer: Self-pay | Admitting: Cardiovascular Disease

## 2014-02-01 DIAGNOSIS — I10 Essential (primary) hypertension: Secondary | ICD-10-CM | POA: Diagnosis not present

## 2014-02-01 DIAGNOSIS — K219 Gastro-esophageal reflux disease without esophagitis: Secondary | ICD-10-CM | POA: Diagnosis present

## 2014-02-01 DIAGNOSIS — E782 Mixed hyperlipidemia: Secondary | ICD-10-CM | POA: Diagnosis present

## 2014-02-01 DIAGNOSIS — IMO0001 Reserved for inherently not codable concepts without codable children: Secondary | ICD-10-CM | POA: Insufficient documentation

## 2014-02-01 DIAGNOSIS — I723 Aneurysm of iliac artery: Secondary | ICD-10-CM | POA: Insufficient documentation

## 2014-02-01 DIAGNOSIS — R55 Syncope and collapse: Principal | ICD-10-CM

## 2014-02-01 DIAGNOSIS — I2510 Atherosclerotic heart disease of native coronary artery without angina pectoris: Secondary | ICD-10-CM | POA: Diagnosis not present

## 2014-02-01 DIAGNOSIS — Z794 Long term (current) use of insulin: Secondary | ICD-10-CM | POA: Insufficient documentation

## 2014-02-01 DIAGNOSIS — K5289 Other specified noninfective gastroenteritis and colitis: Secondary | ICD-10-CM | POA: Diagnosis not present

## 2014-02-01 DIAGNOSIS — E1165 Type 2 diabetes mellitus with hyperglycemia: Secondary | ICD-10-CM

## 2014-02-01 DIAGNOSIS — K529 Noninfective gastroenteritis and colitis, unspecified: Secondary | ICD-10-CM | POA: Diagnosis present

## 2014-02-01 DIAGNOSIS — I25119 Atherosclerotic heart disease of native coronary artery with unspecified angina pectoris: Secondary | ICD-10-CM

## 2014-02-01 DIAGNOSIS — Z7982 Long term (current) use of aspirin: Secondary | ICD-10-CM | POA: Insufficient documentation

## 2014-02-01 DIAGNOSIS — E118 Type 2 diabetes mellitus with unspecified complications: Secondary | ICD-10-CM

## 2014-02-01 DIAGNOSIS — I252 Old myocardial infarction: Secondary | ICD-10-CM | POA: Diagnosis not present

## 2014-02-01 DIAGNOSIS — E1140 Type 2 diabetes mellitus with diabetic neuropathy, unspecified: Secondary | ICD-10-CM | POA: Diagnosis present

## 2014-02-01 DIAGNOSIS — E869 Volume depletion, unspecified: Secondary | ICD-10-CM | POA: Insufficient documentation

## 2014-02-01 DIAGNOSIS — I4891 Unspecified atrial fibrillation: Secondary | ICD-10-CM | POA: Diagnosis not present

## 2014-02-01 DIAGNOSIS — Z951 Presence of aortocoronary bypass graft: Secondary | ICD-10-CM | POA: Diagnosis not present

## 2014-02-01 DIAGNOSIS — G454 Transient global amnesia: Secondary | ICD-10-CM

## 2014-02-01 DIAGNOSIS — I25810 Atherosclerosis of coronary artery bypass graft(s) without angina pectoris: Secondary | ICD-10-CM | POA: Diagnosis present

## 2014-02-01 DIAGNOSIS — R413 Other amnesia: Secondary | ICD-10-CM

## 2014-02-01 MED ORDER — SODIUM CHLORIDE 0.9 % IV SOLN
Freq: Once | INTRAVENOUS | Status: AC
  Administered 2014-02-02: via INTRAVENOUS

## 2014-02-01 NOTE — ED Provider Notes (Signed)
CSN: [id number]     Arrival date & time 02/01/14  2256 History   First MD Initiated Contact with Patient 02/01/14 2301     Chief Complaint  Patient presents with  . Fatigue  . Nausea  . Emesis  . Diarrhea     (Consider location/radiation/quality/duration/timing/severity/associated sxs/prior Treatment) HPI Comments: PT comes in with cc of weakness, nausea, diarrhea. Pt has hx of IDDM, CAD, HTN, HL. PT was doing well last night, woke up feeling weak - but as the day progressed his symptoms got worse. Around 11:30 am - pt was noted to have a fever, 101.4, and fever has persisted. Pt has been nauseated - and has had maybr 2-3 episodes of emesis. Also 1 very large BM this evening.  Pt also started trembling, and unsteady with gait after returning from some blood work.  Patient is a 68 y.o. male presenting with vomiting and diarrhea. The history is provided by the patient.  Emesis Associated symptoms: chills and diarrhea   Associated symptoms: no arthralgias and no headaches   Diarrhea Associated symptoms: chills, fever and vomiting   Associated symptoms: no arthralgias and no headaches     Past Medical History  Diagnosis Date  . Diabetes mellitus   . Hyperlipidemia   . Hypertension   . Myocardial infarction 2006  . GERD (gastroesophageal reflux disease)   . Joint pain   . Dizziness   . CAD (coronary artery disease)   . Iliac artery aneurysm   . Lower back pain Jan 2015  . Atrial fibrillation   . CAD s/p CABG 2006 08/16/2013    Dr vanTrigt - to LAD, SVG to diagonal, SVG to OM, SVG to PDA   . Mixed hyperlipidemia 08/16/2013  . DM type 2 causing complication 10/16/8030   Past Surgical History  Procedure Laterality Date  . Coronary artery bypass graft  2006    LIMA to LAD,SVG to diagonal,SVG to obtuse marginal,SVG to posterior descending  . Iliac artery stent Left Aug. 10, 2011    Aorta to right ext iliac and left CIA stent repair   . Nm myocar perf wall motion  11/18/2009    low  risk   Family History  Problem Relation Age of Onset  . Diabetes Mother   . Hypertension Mother   . Heart disease Mother     AAA  . Heart disease Sister   . Heart disease Son     Heart Disease before age 32   History  Substance Use Topics  . Smoking status: Never Smoker   . Smokeless tobacco: Never Used  . Alcohol Use: No    Review of Systems  Constitutional: Positive for fever, chills and fatigue. Negative for activity change.  Eyes: Negative for visual disturbance.  Respiratory: Negative for cough, chest tightness and shortness of breath.   Cardiovascular: Negative for chest pain.  Gastrointestinal: Positive for nausea, vomiting and diarrhea. Negative for abdominal distention.  Genitourinary: Negative for dysuria, enuresis and difficulty urinating.  Musculoskeletal: Negative for arthralgias and neck pain.  Neurological: Positive for dizziness and tremors. Negative for syncope, light-headedness, numbness and headaches.  Psychiatric/Behavioral: Negative for confusion.      Allergies  Codeine and Valium  Home Medications   Prior to Admission medications   Medication Sig Start Date End Date Taking? Authorizing Provider  aspirin 81 MG tablet Take 81 mg by mouth daily.     Yes Historical Provider, MD  insulin glargine (LANTUS) 100 UNIT/ML injection Inject 38 Units into the skin  every morning.    Yes Historical Provider, MD  metoprolol tartrate (LOPRESSOR) 25 MG tablet Take 25 mg by mouth daily.  06/23/12  Yes Historical Provider, MD  omeprazole (PRILOSEC) 10 MG capsule Take 10 mg by mouth daily.     Yes Historical Provider, MD  ondansetron (ZOFRAN) 8 MG tablet Take 4 mg by mouth every 8 (eight) hours as needed for nausea or vomiting.   Yes Historical Provider, MD  simvastatin (ZOCOR) 40 MG tablet Take 1 tablet (40 mg total) by mouth at bedtime. 08/16/13  Yes Mihai Croitoru, MD   BP 128/71  Pulse 62  Temp(Src) 97.7 F (36.5 C) (Oral)  Resp 20  Ht 5\' 8"  (1.727 m)  Wt 185  lb (83.915 kg)  BMI 28.14 kg/m2  SpO2 94% Physical Exam  Nursing note and vitals reviewed. Constitutional: He is oriented to person, place, and time. He appears well-developed.  HENT:  Head: Normocephalic and atraumatic.  Eyes: Conjunctivae and EOM are normal. Pupils are equal, round, and reactive to light.  Neck: Normal range of motion. Neck supple.  Cardiovascular: Normal rate and regular rhythm.   Pulmonary/Chest: Effort normal and breath sounds normal.  Abdominal: Soft. Bowel sounds are normal. He exhibits no distension. There is no tenderness. There is no rebound and no guarding.  Neurological: He is alert and oriented to person, place, and time.  Skin: Skin is warm.    ED Course  Procedures (including critical care time) Labs Review Labs Reviewed  CBC WITH DIFFERENTIAL - Abnormal; Notable for the following:    WBC 12.6 (*)    Platelets 131 (*)    Neutrophils Relative % 88 (*)    Neutro Abs 11.0 (*)    Lymphocytes Relative 6 (*)    All other components within normal limits  BASIC METABOLIC PANEL - Abnormal; Notable for the following:    Sodium 135 (*)    Glucose, Bld 244 (*)    GFR calc non Af Amer 75 (*)    GFR calc Af Amer 87 (*)    All other components within normal limits  URINALYSIS, ROUTINE W REFLEX MICROSCOPIC - Abnormal; Notable for the following:    Color, Urine AMBER (*)    Glucose, UA 500 (*)    Ketones, ur 15 (*)    All other components within normal limits  GLUCOSE, CAPILLARY - Abnormal; Notable for the following:    Glucose-Capillary 170 (*)    All other components within normal limits  CBG MONITORING, ED - Abnormal; Notable for the following:    Glucose-Capillary 186 (*)    All other components within normal limits  CULTURE, BLOOD (ROUTINE X 2)  CULTURE, BLOOD (ROUTINE X 2)  TROPONIN I  MAGNESIUM    Imaging Review Dg Chest 2 View  02/02/2014   CLINICAL DATA:  Lethargy, fever and chest pain.  Diarrhea.  EXAM: CHEST  2 VIEW  COMPARISON:  Chest  radiograph performed 02/19/2010  FINDINGS: The lungs are well-aerated. Vascular congestion is noted. There is no evidence of focal opacification, pleural effusion or pneumothorax.  The heart is mildly enlarged. The patient is status post median sternotomy, with evidence of prior CABG. No acute osseous abnormalities are seen.  IMPRESSION: Vascular congestion and mild cardiomegaly. Lungs remain grossly clear.   Electronically Signed   By: Garald Balding M.D.   On: 02/02/2014 01:33     EKG Interpretation None      Date: 02/02/2014  Rate: 71  Rhythm: normal sinus rhythm  QRS  Axis: normal  Intervals: normal  ST/T Wave abnormalities: normal  Conduction Disutrbances: none  Narrative Interpretation: unremarkable   On reassessment - i am informed that patient is feeling better, that his memory is improving. I am also informed that patient had passed out 2 times during this episode. Pt has hx of aneurysms, he has CAD, CHF - and is diabetic with fever. Despite him feeling better, he agrees that it is best to get him admitted. Dr. Dyann Kief, IM will order TIA workup himself, he doesn't want me to consult neurology.   MDM   Final diagnoses:  Amnesia  Syncope and collapse  Coronary artery disease with unspecified angina pectoris  Transient global amnesia  Fevers Diabetes  Pt comes in with cc of fever, lethargy. Pt is noted to be sluggish. He is diabetic with CAD hx. Neuro exam is benign. Pt's labs came unremarkable. He was unsteady with gait per RN. When i went to reassess him after above, family informs of 2 episodes of syncope and pt's memory coming back.  TIA? Dysrhythmias? Encephalopathy?  Fever has resolved, and with hydration pt feeling better - but it is best to keep him in for further workup.   Varney Biles, MD 02/02/14 [phone]

## 2014-02-01 NOTE — ED Notes (Signed)
Per EMS. Pt started having flu-like symptoms this morning around lunch time. Pt went to a previously scheduled doctor's appointment and had blood drawn. When pt got home, he walked doen his long driveway and back, and wife reports that he was "trembling". EMS called out because the pt had had an episode of uncontrollable, explosive diarrhea. Pt was lethargic but oriented upon their arrival, and was able to ambulate. Pt denies SOB, chest pain, or numbness/tingling. Wife reports that the pt had a temp of 103 and chest pain at some time today, but pt denies pain at this time. Pt received 4mg  of zofran en route. Pt is resting comfortably at this time.

## 2014-02-02 ENCOUNTER — Encounter (HOSPITAL_COMMUNITY): Payer: Self-pay | Admitting: Internal Medicine

## 2014-02-02 ENCOUNTER — Emergency Department (HOSPITAL_COMMUNITY): Payer: Medicare Other

## 2014-02-02 DIAGNOSIS — I059 Rheumatic mitral valve disease, unspecified: Secondary | ICD-10-CM

## 2014-02-02 DIAGNOSIS — I4891 Unspecified atrial fibrillation: Secondary | ICD-10-CM | POA: Diagnosis present

## 2014-02-02 DIAGNOSIS — I10 Essential (primary) hypertension: Secondary | ICD-10-CM | POA: Diagnosis present

## 2014-02-02 DIAGNOSIS — E118 Type 2 diabetes mellitus with unspecified complications: Secondary | ICD-10-CM

## 2014-02-02 DIAGNOSIS — R55 Syncope and collapse: Secondary | ICD-10-CM

## 2014-02-02 DIAGNOSIS — K219 Gastro-esophageal reflux disease without esophagitis: Secondary | ICD-10-CM | POA: Diagnosis present

## 2014-02-02 DIAGNOSIS — K5289 Other specified noninfective gastroenteritis and colitis: Secondary | ICD-10-CM

## 2014-02-02 DIAGNOSIS — K529 Noninfective gastroenteritis and colitis, unspecified: Secondary | ICD-10-CM | POA: Diagnosis present

## 2014-02-02 LAB — BASIC METABOLIC PANEL
Anion gap: 12 (ref 5–15)
BUN: 16 mg/dL (ref 6–23)
CO2: 21 mEq/L (ref 19–32)
Calcium: 8.6 mg/dL (ref 8.4–10.5)
Chloride: 102 mEq/L (ref 96–112)
Creatinine, Ser: 1 mg/dL (ref 0.50–1.35)
GFR calc Af Amer: 87 mL/min — ABNORMAL LOW (ref >90)
GFR calc non Af Amer: 75 mL/min — ABNORMAL LOW (ref >90)
Glucose, Bld: 244 mg/dL — ABNORMAL HIGH (ref 70–99)
Potassium: 4.3 mEq/L (ref 3.7–5.3)
Sodium: 135 mEq/L — ABNORMAL LOW (ref 137–147)

## 2014-02-02 LAB — GLUCOSE, CAPILLARY
Glucose-Capillary: 170 mg/dL — ABNORMAL HIGH (ref 70–99)
Glucose-Capillary: 181 mg/dL — ABNORMAL HIGH (ref 70–99)
Glucose-Capillary: 161 mg/dL — ABNORMAL HIGH (ref 70–99)
Glucose-Capillary: 154 mg/dL — ABNORMAL HIGH (ref 70–99)

## 2014-02-02 LAB — URINALYSIS, ROUTINE W REFLEX MICROSCOPIC
Bilirubin Urine: NEGATIVE
Glucose, UA: 500 mg/dL — AB
Hgb urine dipstick: NEGATIVE
Ketones, ur: 15 mg/dL — AB
Leukocytes, UA: NEGATIVE
Nitrite: NEGATIVE
Protein, ur: NEGATIVE mg/dL
Specific Gravity, Urine: 1.025 (ref 1.005–1.030)
Urobilinogen, UA: 1 mg/dL (ref 0.0–1.0)
pH: 5 (ref 5.0–8.0)

## 2014-02-02 LAB — CBG MONITORING, ED: Glucose-Capillary: 186 mg/dL — ABNORMAL HIGH (ref 70–99)

## 2014-02-02 LAB — CBC WITH DIFFERENTIAL/PLATELET
Basophils Absolute: 0 10*3/uL (ref 0.0–0.1)
Basophils Relative: 0 % (ref 0–1)
Eosinophils Absolute: 0 10*3/uL (ref 0.0–0.7)
Eosinophils Relative: 0 % (ref 0–5)
HCT: 42.4 % (ref 39.0–52.0)
Hemoglobin: 14.8 g/dL (ref 13.0–17.0)
Lymphocytes Relative: 6 % — ABNORMAL LOW (ref 12–46)
Lymphs Abs: 0.7 10*3/uL (ref 0.7–4.0)
MCH: 30.1 pg (ref 26.0–34.0)
MCHC: 34.9 g/dL (ref 30.0–36.0)
MCV: 86.2 fL (ref 78.0–100.0)
Monocytes Absolute: 0.8 10*3/uL (ref 0.1–1.0)
Monocytes Relative: 6 % (ref 3–12)
Neutro Abs: 11 10*3/uL — ABNORMAL HIGH (ref 1.7–7.7)
Neutrophils Relative %: 88 % — ABNORMAL HIGH (ref 43–77)
Platelets: 131 10*3/uL — ABNORMAL LOW (ref 150–400)
RBC: 4.92 MIL/uL (ref 4.22–5.81)
RDW: 12.9 % (ref 11.5–15.5)
WBC: 12.6 10*3/uL — ABNORMAL HIGH (ref 4.0–10.5)

## 2014-02-02 LAB — MAGNESIUM: Magnesium: 1.8 mg/dL (ref 1.5–2.5)

## 2014-02-02 LAB — TROPONIN I: Troponin I: <0.3 ng/mL (ref <0.30)

## 2014-02-02 MED ORDER — ALBUTEROL SULFATE (2.5 MG/3ML) 0.083% IN NEBU: 2.5000 mg | INHALATION_SOLUTION | RESPIRATORY_TRACT | Status: DC | PRN

## 2014-02-02 MED ORDER — ASPIRIN EC 81 MG PO TBEC
81.0000 mg | DELAYED_RELEASE_TABLET | Freq: Every day | ORAL | Status: DC
  Administered 2014-02-02 – 2014-02-03 (×2): 81 mg via ORAL
  Filled 2014-02-02 (×2): qty 1

## 2014-02-02 MED ORDER — ONDANSETRON HCL 4 MG PO TABS: 4.0000 mg | ORAL_TABLET | Freq: Four times a day (QID) | ORAL | Status: DC | PRN

## 2014-02-02 MED ORDER — INSULIN ASPART 100 UNIT/ML ~~LOC~~ SOLN
0.0000 [IU] | Freq: Three times a day (TID) | SUBCUTANEOUS | Status: DC
  Administered 2014-02-02 (×2): 2 [IU] via SUBCUTANEOUS

## 2014-02-02 MED ORDER — ACETAMINOPHEN 650 MG RE SUPP: 650.0000 mg | Freq: Four times a day (QID) | RECTAL | Status: DC | PRN

## 2014-02-02 MED ORDER — ACETAMINOPHEN 325 MG PO TABS: 650.0000 mg | ORAL_TABLET | Freq: Four times a day (QID) | ORAL | Status: DC | PRN

## 2014-02-02 MED ORDER — PANTOPRAZOLE SODIUM 40 MG PO TBEC
40.0000 mg | DELAYED_RELEASE_TABLET | Freq: Every day | ORAL | Status: DC
  Administered 2014-02-02 – 2014-02-03 (×2): 40 mg via ORAL
  Filled 2014-02-02 (×2): qty 1

## 2014-02-02 MED ORDER — ENOXAPARIN SODIUM 40 MG/0.4ML ~~LOC~~ SOLN
40.0000 mg | SUBCUTANEOUS | Status: DC
  Administered 2014-02-02: 40 mg via SUBCUTANEOUS
  Filled 2014-02-02 (×3): qty 0.4

## 2014-02-02 MED ORDER — INSULIN ASPART 100 UNIT/ML ~~LOC~~ SOLN: 0.0000 [IU] | Freq: Every day | SUBCUTANEOUS | Status: DC

## 2014-02-02 MED ORDER — ONDANSETRON HCL 4 MG/2ML IJ SOLN: 4.0000 mg | Freq: Four times a day (QID) | INTRAMUSCULAR | Status: DC | PRN

## 2014-02-02 MED ORDER — INSULIN GLARGINE 100 UNIT/ML ~~LOC~~ SOLN
38.0000 [IU] | SUBCUTANEOUS | Status: DC
  Administered 2014-02-02: 38 [IU] via SUBCUTANEOUS
  Filled 2014-02-02 (×2): qty 0.38

## 2014-02-02 MED ORDER — ASPIRIN 81 MG PO TABS
81.0000 mg | ORAL_TABLET | Freq: Every day | ORAL | Status: DC
  Filled 2014-02-02: qty 1

## 2014-02-02 MED ORDER — SODIUM CHLORIDE 0.9 % IJ SOLN: 3.0000 mL | Freq: Two times a day (BID) | INTRAMUSCULAR | Status: DC

## 2014-02-02 MED ORDER — METOPROLOL TARTRATE 25 MG PO TABS
25.0000 mg | ORAL_TABLET | Freq: Every day | ORAL | Status: DC
  Administered 2014-02-02 – 2014-02-03 (×2): 25 mg via ORAL
  Filled 2014-02-02 (×2): qty 1

## 2014-02-02 MED ORDER — SODIUM CHLORIDE 0.9 % IV SOLN
INTRAVENOUS | Status: AC
  Administered 2014-02-02: 10:00:00 via INTRAVENOUS

## 2014-02-02 MED ORDER — SIMVASTATIN 40 MG PO TABS
40.0000 mg | ORAL_TABLET | Freq: Every day | ORAL | Status: DC
  Administered 2014-02-02: 40 mg via ORAL
  Filled 2014-02-02 (×2): qty 1

## 2014-02-02 NOTE — Progress Notes (Signed)
VASCULAR LAB PRELIMINARY  PRELIMINARY  PRELIMINARY  PRELIMINARY  Carotid Dopplers completed.    Preliminary report:  1-39% ICA stenosis.  Vertebral artery flow is antegrade.  Naysha Sholl, RVT 02/02/2014, 12:31 PM

## 2014-02-02 NOTE — ED Notes (Signed)
This RN ambulated with the pt in the hall. Pt's gait was mostly steady, and had a few cases of stumbling, but he never appeared off balance. Pt was also able to stand and urinate without assistance. Pt's wife is unsure whether or not she feels comfortable taking the pt home. Kathrynn Humble, Md is aware.

## 2014-02-02 NOTE — H&P (Addendum)
History and Physical  [name] TDS:[id number] DOB: 10-22-1945 DOA: 02/01/2014  Referring physician: EDP PCP: [name], MD  Outpatient Specialists:  1. Cardiology: Dr. Dani Gobble Croitoru. 2. Vascular: Dr. Kellie Simmering.  Chief Complaint: Passed out.  HPI: [name] is a 68 y.o. male with history of type II DM/IDDM, hyperlipidemia, hypertension, CAD status post CABG, iliac artery aneurysm presented to the ED due to above complaints. He was in his usual state of health until the morning of 7/24 when he woke up feeling generally unwell but without specific complaints. He drove himself to PCPs office for routinely scheduled lab work. While returning, he started experiencing generalized body aches, chills and subjective fevers. He felt unsteady when he got out of the car getting into his home. He states that he felt he states that he felt like he had the flu. He checked his CBG which was 130 mg/dL. He took a shower and went to bed. He woke up feeling slightly better. Had his lunch and then felt nauseous. He went to the toilet and sat on it and then doesn't remember anything. The next thing he remembers is the paramedics talking to him and he was still sitting on the toilet. He was subsequently told that he had vomiting and diarrhea-unknown times. He denies fever or abdominal pain. He denies any chest pain, palpitations, dyspnea, headache or strokelike symptoms. He was brought to the emergency department and has had no further nausea vomiting or diarrhea. He feels generally weak but otherwise improved compared to yesterday. He states that he had similar episode of fevers and chills a month ago but not associated with GI symptoms and these resolved spontaneously. He denies recent travel, recent antibiotic use, hospitalization, sickly contacts with similar symptoms or eating anything unusual. In the ED, lab work was significant for glucose of 244, WBC 2.6, platelets 131, negative troponin and chest x-ray  with vascular distention but otherwise clear. EKG was normal sinus rhythm without acute findings. He was admitted for observation and management.   Review of Systems: All systems reviewed and apart from history of presenting illness, are negative.  Past Medical History  Diagnosis Date  . Diabetes mellitus   . Hyperlipidemia   . Hypertension   . Myocardial infarction 2006  . GERD (gastroesophageal reflux disease)   . Joint pain   . Dizziness   . CAD (coronary artery disease)   . Iliac artery aneurysm   . Lower back pain Jan 2015  . Atrial fibrillation   . CAD s/p CABG 2006 08/16/2013    Dr vanTrigt - to LAD, SVG to diagonal, SVG to OM, SVG to PDA   . Mixed hyperlipidemia 08/16/2013  . DM type 2 causing complication 08/17/2033   Past Surgical History  Procedure Laterality Date  . Coronary artery bypass graft  2006    LIMA to LAD,SVG to diagonal,SVG to obtuse marginal,SVG to posterior descending  . Iliac artery stent Left Aug. 10, 2011    Aorta to right ext iliac and left CIA stent repair   . Nm myocar perf wall motion  11/18/2009    low risk   Social History:  reports that he has never smoked. He has never used smokeless tobacco. He reports that he does not drink alcohol or use illicit drugs. Married. Independent of activities of daily living.  Allergies  Allergen Reactions  . Codeine Nausea Only    Cannot take on empty stomach.  . Valium Nausea Only    Cannot take  on empty stomach.    Family History  Problem Relation Age of Onset  . Diabetes Mother   . Hypertension Mother   . Heart disease Mother     AAA  . Heart disease Sister   . Heart disease Son     Heart Disease before age 50    Prior to Admission medications   Medication Sig Start Date End Date Taking? Authorizing Provider  aspirin 81 MG tablet Take 81 mg by mouth daily.     Yes Historical Provider, MD  insulin glargine (LANTUS) 100 UNIT/ML injection Inject 38 Units into the skin every morning.    Yes Historical  Provider, MD  metoprolol tartrate (LOPRESSOR) 25 MG tablet Take 25 mg by mouth daily.  06/23/12  Yes Historical Provider, MD  omeprazole (PRILOSEC) 10 MG capsule Take 10 mg by mouth daily.     Yes Historical Provider, MD  ondansetron (ZOFRAN) 8 MG tablet Take 4 mg by mouth every 8 (eight) hours as needed for nausea or vomiting.   Yes Historical Provider, MD  simvastatin (ZOCOR) 40 MG tablet Take 1 tablet (40 mg total) by mouth at bedtime. 08/16/13  Yes Sanda Klein, MD   Physical Exam: Filed Vitals:   02/02/14 0545 02/02/14 0630 02/02/14 0645 02/02/14 0835  BP: 142/63 138/74 132/75 128/71  Pulse: 63 63 80 62  Temp:    97.7 F (36.5 C)  TempSrc:    Oral  Resp: 27 21 17 20   Height:      Weight:      SpO2: 99% 97% 98% 94%     General exam: Moderately built and nourished pleasant male patient, lying comfortably supine in bed in no obvious distress.  Head, eyes and ENT: Nontraumatic and normocephalic. Pupils equally reacting to light and accommodation. Oral mucosa slightly dry.  Neck: Supple. No JVD, carotid bruit or thyromegaly.  Lymphatics: No lymphadenopathy.  Respiratory system: Clear to auscultation. No increased work of breathing. Midline sternotomy scar.  Cardiovascular system: S1 and S2 heard, RRR. No JVD, murmurs, gallops, clicks or pedal edema.  Gastrointestinal system: Abdomen is nondistended, soft and nontender. Normal bowel sounds heard. No organomegaly or masses appreciated.  Central nervous system: Alert and oriented. No focal neurological deficits.  Extremities: Symmetric 5 x 5 power. Peripheral pulses symmetrically felt.   Skin: No rashes or acute findings.  Musculoskeletal system: Negative exam.  Psychiatry: Pleasant and cooperative.   Labs on Admission:  Basic Metabolic Panel:  Recent Labs Lab 02/02/14 0009  NA 135*  K 4.3  CL 102  CO2 21  GLUCOSE 244*  BUN 16  CREATININE 1.00  CALCIUM 8.6  MG 1.8   Liver Function Tests: No results found  for this basename: AST, ALT, ALKPHOS, BILITOT, PROT, ALBUMIN,  in the last 168 hours No results found for this basename: LIPASE, AMYLASE,  in the last 168 hours No results found for this basename: AMMONIA,  in the last 168 hours CBC:  Recent Labs Lab 02/02/14 0009  WBC 12.6*  NEUTROABS 11.0*  HGB 14.8  HCT 42.4  MCV 86.2  PLT 131*   Cardiac Enzymes:  Recent Labs Lab 02/02/14 0009  TROPONINI <0.30    BNP (last 3 results) No results found for this basename: PROBNP,  in the last 8760 hours CBG:  Recent Labs Lab 02/02/14 0645 02/02/14 0831  GLUCAP 186* 170*    Radiological Exams on Admission: Dg Chest 2 View  02/02/2014   CLINICAL DATA:  Lethargy, fever and chest pain.  Diarrhea.  EXAM: CHEST  2 VIEW  COMPARISON:  Chest radiograph performed 02/19/2010  FINDINGS: The lungs are well-aerated. Vascular congestion is noted. There is no evidence of focal opacification, pleural effusion or pneumothorax.  The heart is mildly enlarged. The patient is status post median sternotomy, with evidence of prior CABG. No acute osseous abnormalities are seen.  IMPRESSION: Vascular congestion and mild cardiomegaly. Lungs remain grossly clear.   Electronically Signed   By: Garald Balding M.D.   On: 02/02/2014 01:33    EKG: Independently reviewed. Sinus rhythm without acute findings.  Assessment/Plan Principal Problem:   Syncope Active Problems:   CAD s/p CABG 2006   DM type 2 causing complication   Mixed hyperlipidemia   Hypertension   GERD (gastroesophageal reflux disease)   Atrial fibrillation   Acute gastroenteritis   1. Syncope: In the context of acute GI illness and viral-like syndrome. Likely vasovagal or orthostatic hypotension related. No focal deficits, no acute EKG changes and troponin negative. Admit to telemetry. Check orthostatic blood pressures. Hydrate with IV fluids for intravascular volume depletion. No 2-D echo or carotid Dopplers in system- will request. Ambulate with  assistance. 2. Possible acute viral gastroenteritis: Improved. No further nausea, vomiting or diarrhea. Start diet and monitor. 3. Mild dehydration: Brief IV fluids. 4. Uncontrolled type II DM/IDDM: Continue home dose of Lantus and add NovoLog SSI. 5. Hypertension: Controlled. Continue home dose of metoprolol. 6. History of CAD status post CABG 2006: Asymptomatic. Continue aspirin, statins and metoprolol. 7. History of hyperlipidemia: Continue statins. 8. History of GERD: Continue PPI. 9. Iliac artery aneurysm: Outpatient followup.  Code Status: Full  Family Communication: None bed side  Disposition Plan: Home when medically stable, possibly 7/26.   Time spent: 54 minutes  Kimiyah Blick, MD, FACP, FHM. [facility] Pager [phone]  If 7PM-7AM, please contact night-coverage [email or web address] Password [facility] 02/02/2014, 8:48 AM

## 2014-02-02 NOTE — ED Notes (Signed)
TRANSPORTING PATIENT TO NEW ROOM ASSIGNMENT.

## 2014-02-02 NOTE — Progress Notes (Signed)
Echo Lab  2D Echocardiogram completed.  [place], RDCS 02/02/2014 12:19 PM

## 2014-02-02 NOTE — Care Management Utilization Note (Signed)
UR completed.    Silas Sedam Wise Marlow Hendrie, RN, BSN Phone #[phone]  

## 2014-02-03 DIAGNOSIS — R413 Other amnesia: Secondary | ICD-10-CM

## 2014-02-03 LAB — CBC
HCT: 39 % (ref 39.0–52.0)
Hemoglobin: 13.2 g/dL (ref 13.0–17.0)
MCH: 29 pg (ref 26.0–34.0)
MCHC: 33.8 g/dL (ref 30.0–36.0)
MCV: 85.7 fL (ref 78.0–100.0)
Platelets: 131 10*3/uL — ABNORMAL LOW (ref 150–400)
RBC: 4.55 MIL/uL (ref 4.22–5.81)
RDW: 13 % (ref 11.5–15.5)
WBC: 9 10*3/uL (ref 4.0–10.5)

## 2014-02-03 LAB — BASIC METABOLIC PANEL
Anion gap: 10 (ref 5–15)
BUN: 16 mg/dL (ref 6–23)
CO2: 24 mEq/L (ref 19–32)
Calcium: 8.5 mg/dL (ref 8.4–10.5)
Chloride: 108 mEq/L (ref 96–112)
Creatinine, Ser: 1.09 mg/dL (ref 0.50–1.35)
GFR calc Af Amer: 79 mL/min — ABNORMAL LOW (ref >90)
GFR calc non Af Amer: 68 mL/min — ABNORMAL LOW (ref >90)
Glucose, Bld: 107 mg/dL — ABNORMAL HIGH (ref 70–99)
Potassium: 4.3 mEq/L (ref 3.7–5.3)
Sodium: 142 mEq/L (ref 137–147)

## 2014-02-03 LAB — GLUCOSE, CAPILLARY: Glucose-Capillary: 119 mg/dL — ABNORMAL HIGH (ref 70–99)

## 2014-02-03 NOTE — Progress Notes (Signed)
Pt discharge home. Discharge instruction reviewed. Pt VU. Monitoring will continue.

## 2014-02-03 NOTE — Discharge Summary (Addendum)
Physician Discharge Summary  [name] NIO:[id number] DOB: [birth year] DOA: 02/01/2014  PCP: [name], MD  Admit date: 02/01/2014 Discharge date: 02/03/2014  Time spent: 35 minutes  Recommendations for Outpatient Follow-up:  1. Recommend close followup with primary care physician to rule out other etiologies-this episode was most likely an acute gastroenteritis however. 2. Continue home meds   Discharge Diagnoses:  Principal Problem:   Syncope Active Problems:   CAD s/p CABG 2006   DM type 2 causing complication   Mixed hyperlipidemia   Hypertension   GERD (gastroesophageal reflux disease)   Atrial fibrillation   Acute gastroenteritis   Discharge Condition: Good  Diet recommendation: Heart healthy  Filed Weights   02/01/14 2305 02/03/14 0427  Weight: 83.915 kg (185 lb) 84.369 kg (186 lb)    History of present illness:  68 y/o ? known history type 2 diabetes, HLD, HTN, CAD status post CABG 2006, iliac artery aneurysm admitted with 02/01/14 admitted with generalized body aches chills fever as flulike symptoms-subsequently had nausea and passed out on the toilet. Admitted for  acute gastroenteritis As she had syncopal episode was thought that it would be reasonable to rule out new wall motion abnormalities and a chest pain rule out by a negative troponin/EKG. Also was hydrated with fluids for intravascular volume depletion and started on diet Patient was able to tolerate a diet and ambulated without any notification of possible orthostatic signs. echocardiogram performed not showing any specific finding. Patient was instructed for primary care physician for further concerns and discharge in stable state Discharge diagnosis gastroenteritis   Discharge Exam: Filed Vitals:   02/03/14 0427  BP: 129/72  Pulse: 56  Temp: 98 F (36.7 C)  Resp: 19    General: Alert pleasant oriented no apparent distress Cardiovascular: S1-S2 no murmur gallop Respiratory: Clinically  clear no added sound  Discharge Instructions You were cared for by a hospitalist during your hospital stay. If you have any questions about your discharge medications or the care you received while you were in the hospital after you are discharged, you can call the unit and asked to speak with the hospitalist on call if the hospitalist that took care of you is not available. Once you are discharged, your primary care physician will handle any further medical issues. Please note that NO REFILLS for any discharge medications will be authorized once you are discharged, as it is imperative that you return to your primary care physician (or establish a relationship with a primary care physician if you do not have one) for your aftercare needs so that they can reassess your need for medications and monitor your lab values.     Medication List         aspirin 81 MG tablet  Take 81 mg by mouth daily.     insulin glargine 100 UNIT/ML injection  Commonly known as:  LANTUS  Inject 38 Units into the skin every morning.     metoprolol tartrate 25 MG tablet  Commonly known as:  LOPRESSOR  Take 25 mg by mouth daily.     omeprazole 10 MG capsule  Commonly known as:  PRILOSEC  Take 10 mg by mouth daily.     ondansetron 8 MG tablet  Commonly known as:  ZOFRAN  Take 4 mg by mouth every 8 (eight) hours as needed for nausea or vomiting.     simvastatin 40 MG tablet  Commonly known as:  ZOCOR  Take 1 tablet (40 mg total) by mouth at  bedtime.       Allergies  Allergen Reactions  . Codeine Nausea Only    Cannot take on empty stomach.  . Valium Nausea Only    Cannot take on empty stomach.      The results of significant diagnostics from this hospitalization (including imaging, microbiology, ancillary and laboratory) are listed below for reference.    Significant Diagnostic Studies: Dg Chest 2 View  02/02/2014   CLINICAL DATA:  Lethargy, fever and chest pain.  Diarrhea.  EXAM: CHEST  2 VIEW   COMPARISON:  Chest radiograph performed 02/19/2010  FINDINGS: The lungs are well-aerated. Vascular congestion is noted. There is no evidence of focal opacification, pleural effusion or pneumothorax.  The heart is mildly enlarged. The patient is status post median sternotomy, with evidence of prior CABG. No acute osseous abnormalities are seen.  IMPRESSION: Vascular congestion and mild cardiomegaly. Lungs remain grossly clear.   Electronically Signed   By: Garald Balding M.D.   On: 02/02/2014 01:33    Microbiology: No results found for this or any previous visit (from the past 240 hour(s)).   Labs: Basic Metabolic Panel:  Recent Labs Lab 02/02/14 0009 02/03/14 0457  NA 135* 142  K 4.3 4.3  CL 102 108  CO2 21 24  GLUCOSE 244* 107*  BUN 16 16  CREATININE 1.00 1.09  CALCIUM 8.6 8.5  MG 1.8  --    Liver Function Tests: No results found for this basename: AST, ALT, ALKPHOS, BILITOT, PROT, ALBUMIN,  in the last 168 hours No results found for this basename: LIPASE, AMYLASE,  in the last 168 hours No results found for this basename: AMMONIA,  in the last 168 hours CBC:  Recent Labs Lab 02/02/14 0009 02/03/14 0457  WBC 12.6* 9.0  NEUTROABS 11.0*  --   HGB 14.8 13.2  HCT 42.4 39.0  MCV 86.2 85.7  PLT 131* 131*   Cardiac Enzymes:  Recent Labs Lab 02/02/14 0009  TROPONINI <0.30   BNP: BNP (last 3 results) No results found for this basename: PROBNP,  in the last 8760 hours CBG:  Recent Labs Lab 02/02/14 0831 02/02/14 1128 02/02/14 1620 02/02/14 2122 02/03/14 0617  GLUCAP 170* 181* 161* 154* 119*       Signed:  Nita Sells  [facility] 02/03/2014, 9:45 AM

## 2014-02-08 LAB — CULTURE, BLOOD (ROUTINE X 2)
Culture: NO GROWTH
Culture: NO GROWTH

## 2014-03-29 ENCOUNTER — Telehealth: Payer: Self-pay | Admitting: Cardiovascular Disease

## 2014-03-29 MED ORDER — OMEPRAZOLE 40 MG PO CPDR: 40.0000 mg | DELAYED_RELEASE_CAPSULE | Freq: Every day | ORAL | Status: DC

## 2014-03-29 NOTE — Telephone Encounter (Signed)
Please call,Pt had a lot of episodes the last few days, of burning in his chest neck and shoulder.He woke up last night sweating a lot. He is real weak and tired this morning,he is sleeping now. Pt wants to be seen by a doctor,not a PA.

## 2014-03-29 NOTE — Telephone Encounter (Signed)
Returned call to patient/wife.   Complaints began last Wednesday and are as follows: 1. Burning in throat, neck, top of chest, shoulder blades, down arm  ** associated with belching (has doubled up on prilosec a few times w/relief) 2. Last night, symptoms woke him up in middle of night - diaphoretic, worse than previous episodes (took a shower, prilosec, sat up a while and then went back to sleep) 3. Episodes generally last about 30-40 minutes  Patient DENIES chest pain/pressure/tightness/heaviness Patient DENIES shortness of breath Patient DENIES nausea, vomiting, lightheadedness, dizziness, pre-syncope  Patient does not have any BP or HR readings to provide.   Patient did have diarrhea this week, that has since resolved.   Patient would like to be seen to evaluate symptoms - states he can wait until Dr. Sallyanne Kuster is back in office (if DOD thinks this is OK). Informed patient I would consult with MD and call him back.

## 2014-03-29 NOTE — Telephone Encounter (Signed)
Spoke with Mickel Baas, NP about patient's symptoms. She recommended increased omeprazole from 10 to 40mg  once daily and having patient see Dr. Sallyanne Kuster in the office as soon as possible for evaluation. Patient is aware that if symptoms get worse or more frequent to go to ED for eval.   Message deferred to Dr. Sallyanne Kuster as Juluis Rainier and will route message to scheduler to set up appointment.

## 2014-04-01 ENCOUNTER — Telehealth: Payer: Self-pay | Admitting: Cardiovascular Disease

## 2014-04-01 NOTE — Telephone Encounter (Signed)
Closed encounter °

## 2014-04-01 NOTE — Telephone Encounter (Signed)
Patient scheduled by [facility] for 04/03/14

## 2014-04-01 NOTE — Telephone Encounter (Signed)
May have to double book if there are no other options

## 2014-04-03 ENCOUNTER — Ambulatory Visit (INDEPENDENT_AMBULATORY_CARE_PROVIDER_SITE_OTHER): Payer: Medicare Other | Admitting: Cardiovascular Disease

## 2014-04-03 ENCOUNTER — Other Ambulatory Visit: Payer: Self-pay | Admitting: *Deleted

## 2014-04-03 ENCOUNTER — Encounter: Payer: Self-pay | Admitting: Cardiovascular Disease

## 2014-04-03 VITALS — BP 154/98 | HR 81 | Ht 68.0 in | Wt 190.6 lb

## 2014-04-03 DIAGNOSIS — Z79899 Other long term (current) drug therapy: Secondary | ICD-10-CM

## 2014-04-03 DIAGNOSIS — D689 Coagulation defect, unspecified: Secondary | ICD-10-CM

## 2014-04-03 DIAGNOSIS — I4891 Unspecified atrial fibrillation: Secondary | ICD-10-CM

## 2014-04-03 DIAGNOSIS — R5383 Other fatigue: Secondary | ICD-10-CM

## 2014-04-03 DIAGNOSIS — I200 Unstable angina: Secondary | ICD-10-CM

## 2014-04-03 DIAGNOSIS — I25110 Atherosclerotic heart disease of native coronary artery with unstable angina pectoris: Secondary | ICD-10-CM

## 2014-04-03 DIAGNOSIS — I480 Paroxysmal atrial fibrillation: Secondary | ICD-10-CM

## 2014-04-03 DIAGNOSIS — I2510 Atherosclerotic heart disease of native coronary artery without angina pectoris: Secondary | ICD-10-CM

## 2014-04-03 DIAGNOSIS — R5381 Other malaise: Secondary | ICD-10-CM

## 2014-04-03 LAB — CBC
HCT: 44.2 % (ref 39.0–52.0)
Hemoglobin: 15.4 g/dL (ref 13.0–17.0)
MCH: 29.6 pg (ref 26.0–34.0)
MCHC: 34.8 g/dL (ref 30.0–36.0)
MCV: 84.8 fL (ref 78.0–100.0)
Platelets: 148 10*3/uL — ABNORMAL LOW (ref 150–400)
RBC: 5.21 MIL/uL (ref 4.22–5.81)
RDW: 13.6 % (ref 11.5–15.5)
WBC: 10 10*3/uL (ref 4.0–10.5)

## 2014-04-03 LAB — COMPREHENSIVE METABOLIC PANEL
ALT: 18 U/L (ref 0–53)
AST: 44 U/L — ABNORMAL HIGH (ref 0–37)
Albumin: 4.5 g/dL (ref 3.5–5.2)
Alkaline Phosphatase: 78 U/L (ref 39–117)
BUN: 12 mg/dL (ref 6–23)
CO2: 28 mEq/L (ref 19–32)
Calcium: 9.1 mg/dL (ref 8.4–10.5)
Chloride: 101 mEq/L (ref 96–112)
Creat: 1.03 mg/dL (ref 0.50–1.35)
Glucose, Bld: 256 mg/dL — ABNORMAL HIGH (ref 70–99)
Potassium: 3.9 mEq/L (ref 3.5–5.3)
Sodium: 135 mEq/L (ref 135–145)
Total Bilirubin: 0.8 mg/dL (ref 0.2–1.2)
Total Protein: 7 g/dL (ref 6.0–8.3)

## 2014-04-03 MED ORDER — NITROGLYCERIN 0.4 MG SL SUBL: 0.4000 mg | SUBLINGUAL_TABLET | SUBLINGUAL | Status: DC | PRN

## 2014-04-03 NOTE — Patient Instructions (Signed)
Your physician has requested that you have a cardiac catheterization Thursday or Friday. Cardiac catheterization is used to diagnose and/or treat various heart conditions. Doctors may recommend this procedure for a number of different reasons. The most common reason is to evaluate chest pain. Chest pain can be a symptom of coronary artery disease (CAD), and cardiac catheterization can show whether plaque is narrowing or blocking your heart's arteries. This procedure is also used to evaluate the valves, as well as measure the blood flow and oxygen levels in different parts of your heart. For further information please visit [email or web address]. Please follow instruction sheet, as given.

## 2014-04-03 NOTE — Progress Notes (Signed)
Patient ID: [name], male   DOB: [birth year], 68 y.o.   MRN: [id number]      Reason for office visit Unstable angina  [name] has not had coronary problems since his bypass surgery in 2006, but started having worrisome complaints last week. Seven days ago, he had abrupt onset of a burning sensation in his throat and both shoulders and diaphoresis. Onset was at rest and he thinks that standing up helped the burning. It was not related to a meal. The same phenomenon happened the next day, again at rest and a few milder episodes over the weekend. No further problems in the last 2-3 days, but he has an uneasy feeling. He thinks he's had more belching. He has not had dyspnea. When asking about his previous anginal symptoms, he primarily recalls dyspnea as his main complaint before surgery, but seems to recall a similar burning sensation in his neck.  He has a history of coronary disease as well as peripheral arterial disease. He underwent four-vessel bypass surgery in 2006 (LIMA-LAD, SVG x 3 to Diagonal, OM and PDA). A nuclear stress test in 2011 was normal. He has normal left ventricular systolic function. He has received a stent in the large right common iliac artery aneurysm and is monitored by Dr. Kellie Simmering. He has insulin requiring diabetes mellitus and his hemoglobin A1c has consistently been around 6.9%. He has chronically low HDL cholesterol , around 27 mg/dL. Over the last year his LDL has oscillated between 70 and 100 mg/dL, Triglycerides are typically over 200.    Allergies  Allergen Reactions  . Codeine Nausea Only    Cannot take on empty stomach.  . Valium Nausea Only    Cannot take on empty stomach.    Current Outpatient Prescriptions  Medication Sig Dispense Refill  . aspirin 81 MG tablet Take 81 mg by mouth daily.        . clotrimazole-betamethasone (LOTRISONE) cream       . insulin glargine (LANTUS) 100 UNIT/ML injection Inject 38 Units into the skin every morning.       .  metoprolol tartrate (LOPRESSOR) 25 MG tablet Take 25 mg by mouth daily.       [name] omeprazole (PRILOSEC) 40 MG capsule Take 1 capsule (40 mg total) by mouth daily.  30 capsule  3  . ondansetron (ZOFRAN) 8 MG tablet Take 4 mg by mouth every 8 (eight) hours as needed for nausea or vomiting.      . ONE TOUCH ULTRA TEST test strip       . simvastatin (ZOCOR) 40 MG tablet Take 1 tablet (40 mg total) by mouth at bedtime.  90 tablet  3   No current facility-administered medications for this visit.    Past Medical History  Diagnosis Date  . Diabetes mellitus   . Hyperlipidemia   . Hypertension   . Myocardial infarction 2006  . GERD (gastroesophageal reflux disease)   . Joint pain   . Dizziness   . CAD (coronary artery disease)   . Iliac artery aneurysm   . Lower back pain Jan 2015  . Atrial fibrillation   . CAD s/p CABG 2006 08/16/2013    Dr vanTrigt - to LAD, SVG to diagonal, SVG to OM, SVG to PDA   . Mixed hyperlipidemia 08/16/2013  . DM type 2 causing complication 03/17/7892    Past Surgical History  Procedure Laterality Date  . Coronary artery bypass graft  2006    LIMA to LAD,SVG to diagonal,SVG  to obtuse marginal,SVG to posterior descending  . Iliac artery stent Left Aug. 10, 2011    Aorta to right ext iliac and left CIA stent repair   . Nm myocar perf wall motion  11/18/2009    low risk    Family History  Problem Relation Age of Onset  . Diabetes Mother   . Hypertension Mother   . Heart disease Mother     AAA  . Heart disease Sister   . Heart disease Son     Heart Disease before age 90    History   Social History  . Marital Status: Married    Spouse Name: N/A    Number of Children: N/A  . Years of Education: N/A   Occupational History  . Not on file.   Social History Main Topics  . Smoking status: Never Smoker   . Smokeless tobacco: Never Used  . Alcohol Use: No  . Drug Use: No  . Sexual Activity: Not on file   Other Topics Concern  . Not on file   Social  History Narrative  . No narrative on file    Review of systems: The patient specifically denies any chest pain with exertion, dyspnea at rest or with exertion, orthopnea, paroxysmal nocturnal dyspnea, syncope, palpitations, focal neurological deficits, intermittent claudication, lower extremity edema, unexplained weight gain, cough, hemoptysis or wheezing.  The patient also denies abdominal pain, nausea, vomiting, dysphagia, diarrhea, constipation, polyuria, polydipsia, dysuria, hematuria, frequency, urgency, abnormal bleeding or bruising, fever, chills, unexpected weight changes, mood swings, change in skin or hair texture, change in voice quality, auditory or visual problems, allergic reactions or rashes, new musculoskeletal complaints other than usual "aches and pains".  PHYSICAL EXAM BP 154/98  Pulse 81  Ht 5\' 8"  (1.727 m)  Wt 86.456 kg (190 lb 9.6 oz)  BMI 28.99 kg/m2 General: Alert, oriented x3, no distress  Head: no evidence of trauma, PERRL, EOMI, no exophtalmos or lid lag, no myxedema, no xanthelasma; normal ears, nose and oropharynx  Neck: normal jugular venous pulsations and no hepatojugular reflux; brisk carotid pulses without delay and no carotid bruits  Chest: clear to auscultation, no signs of consolidation by percussion or palpation, normal fremitus, symmetrical and full respiratory excursions, sternotomy scar  Cardiovascular: normal position and quality of the apical impulse, regular rhythm, normal first and second heart sounds, no murmurs, rubs or gallops  Abdomen: no tenderness or distention, no masses by palpation, no abnormal pulsatility or arterial bruits, normal bowel sounds, no hepatosplenomegaly  Extremities: no clubbing, cyanosis or edema; 2+ radial, ulnar and brachial pulses bilaterally; 2+ right femoral, posterior tibial and dorsalis pedis pulses; 2+ left femoral, posterior tibial and dorsalis pedis pulses; no subclavian or femoral bruits  Neurological: grossly  nonfocal  EKG: NSR, new anterolateral T wave inversion  Lipid Panel  July 26, 2013 (Dr. Minna Antis)  Total cholesterol 173, HDL 27, LDL 99, triglycerides 234  Hemoglobin A1c 6.8%  Creatinine 0.93 normal LFTs   BMET    Component Value Date/Time   NA 142 02/03/2014 0457   K 4.3 02/03/2014 0457   CL 108 02/03/2014 0457   CO2 24 02/03/2014 0457   GLUCOSE 107* 02/03/2014 0457   BUN 16 02/03/2014 0457   CREATININE 1.09 02/03/2014 0457   CALCIUM 8.5 02/03/2014 0457   GFRNONAA 68* 02/03/2014 0457   GFRAA 79* 02/03/2014 0457     ASSESSMENT AND PLAN   I think [name] has complaints consistent with unstable angina pectoris and he has  ischemic ECG changes. He has known CAD, 9 years after multivessel CABG. He has been relatively well for the last 72 hours. I believe he needs urgent, but not emergency cardiac catheterization. Will schedule ASAP, within the next 24-48 hours. He needs to present to the ED/call 911 immediately if his chest burning sensation lasts more than 20-30 minutes and is not relieved by SL NTG x 3. This procedure has been fully reviewed with the patient and written informed consent has been obtained.  Orders Placed This Encounter  Procedures  . CBC  . Comprehensive metabolic panel  . APTT  . Protime-INR  . EKG 12-Lead  . LEFT HEART CATHETERIZATION WITH CORONARY ANGIOGRAM   Meds ordered this encounter  Medications  . clotrimazole-betamethasone (LOTRISONE) cream    Sig:   . ONE TOUCH ULTRA TEST test strip    Sig:     Holli Humbles, MD, [facility] [phone] office [phone] pager

## 2014-04-04 ENCOUNTER — Encounter (HOSPITAL_COMMUNITY): Payer: Self-pay | Admitting: Pharmacy Technician

## 2014-04-04 LAB — PROTIME-INR
INR: 1.03 (ref <1.50)
Prothrombin Time: 13.5 seconds (ref 11.6–15.2)

## 2014-04-04 LAB — APTT: aPTT: 33 seconds (ref 24–37)

## 2014-04-05 ENCOUNTER — Encounter (HOSPITAL_COMMUNITY): Payer: Self-pay | Admitting: General Practice

## 2014-04-05 ENCOUNTER — Ambulatory Visit (HOSPITAL_COMMUNITY)
Admission: RE | Admit: 2014-04-05 | Discharge: 2014-04-06 | Disposition: A | Discharge status: Home / Self Care | Payer: Medicare Other | Source: Ambulatory Visit | Attending: Cardiovascular Disease | Admitting: Cardiovascular Disease

## 2014-04-05 ENCOUNTER — Encounter (HOSPITAL_COMMUNITY)
Admission: RE | Disposition: A | Discharge status: Home / Self Care | Payer: Medicare Other | Source: Ambulatory Visit | Attending: Cardiovascular Disease

## 2014-04-05 DIAGNOSIS — Z794 Long term (current) use of insulin: Secondary | ICD-10-CM | POA: Diagnosis not present

## 2014-04-05 DIAGNOSIS — I10 Essential (primary) hypertension: Secondary | ICD-10-CM | POA: Diagnosis not present

## 2014-04-05 DIAGNOSIS — I25110 Atherosclerotic heart disease of native coronary artery with unstable angina pectoris: Secondary | ICD-10-CM

## 2014-04-05 DIAGNOSIS — Z79899 Other long term (current) drug therapy: Secondary | ICD-10-CM | POA: Diagnosis not present

## 2014-04-05 DIAGNOSIS — I2510 Atherosclerotic heart disease of native coronary artery without angina pectoris: Secondary | ICD-10-CM | POA: Insufficient documentation

## 2014-04-05 DIAGNOSIS — E119 Type 2 diabetes mellitus without complications: Secondary | ICD-10-CM | POA: Diagnosis not present

## 2014-04-05 DIAGNOSIS — I25810 Atherosclerosis of coronary artery bypass graft(s) without angina pectoris: Secondary | ICD-10-CM | POA: Insufficient documentation

## 2014-04-05 DIAGNOSIS — I2589 Other forms of chronic ischemic heart disease: Secondary | ICD-10-CM | POA: Diagnosis not present

## 2014-04-05 DIAGNOSIS — D649 Anemia, unspecified: Secondary | ICD-10-CM | POA: Diagnosis not present

## 2014-04-05 DIAGNOSIS — Z7982 Long term (current) use of aspirin: Secondary | ICD-10-CM | POA: Diagnosis not present

## 2014-04-05 DIAGNOSIS — K219 Gastro-esophageal reflux disease without esophagitis: Secondary | ICD-10-CM | POA: Diagnosis not present

## 2014-04-05 DIAGNOSIS — I252 Old myocardial infarction: Secondary | ICD-10-CM | POA: Insufficient documentation

## 2014-04-05 DIAGNOSIS — E782 Mixed hyperlipidemia: Secondary | ICD-10-CM | POA: Diagnosis not present

## 2014-04-05 DIAGNOSIS — I25119 Atherosclerotic heart disease of native coronary artery with unspecified angina pectoris: Secondary | ICD-10-CM

## 2014-04-05 DIAGNOSIS — I739 Peripheral vascular disease, unspecified: Secondary | ICD-10-CM

## 2014-04-05 DIAGNOSIS — I200 Unstable angina: Secondary | ICD-10-CM | POA: Diagnosis present

## 2014-04-05 DIAGNOSIS — I255 Ischemic cardiomyopathy: Secondary | ICD-10-CM

## 2014-04-05 DIAGNOSIS — E1140 Type 2 diabetes mellitus with diabetic neuropathy, unspecified: Secondary | ICD-10-CM | POA: Diagnosis present

## 2014-04-05 HISTORY — PX: LEFT HEART CATHETERIZATION WITH CORONARY/GRAFT ANGIOGRAM: SHX5450

## 2014-04-05 HISTORY — DX: Ischemic cardiomyopathy: I25.5

## 2014-04-05 HISTORY — DX: Calculus of kidney: N20.0

## 2014-04-05 HISTORY — DX: Type 2 diabetes mellitus without complications: E11.9

## 2014-04-05 HISTORY — DX: Unspecified osteoarthritis, unspecified site: M19.90

## 2014-04-05 HISTORY — PX: CORONARY ANGIOPLASTY WITH STENT PLACEMENT: SHX49

## 2014-04-05 LAB — GLUCOSE, CAPILLARY
Glucose-Capillary: 183 mg/dL — ABNORMAL HIGH (ref 70–99)
Glucose-Capillary: 206 mg/dL — ABNORMAL HIGH (ref 70–99)

## 2014-04-05 LAB — POCT ACTIVATED CLOTTING TIME: Activated Clotting Time: 467 seconds

## 2014-04-05 SURGERY — LEFT HEART CATHETERIZATION WITH CORONARY/GRAFT ANGIOGRAM
Anesthesia: LOCAL

## 2014-04-05 MED ORDER — SODIUM CHLORIDE 0.9 % IV SOLN
0.2500 mg/kg/h | INTRAVENOUS | Status: DC
  Administered 2014-04-05: 0.25 mg/kg/h via INTRAVENOUS
  Filled 2014-04-05: qty 250

## 2014-04-05 MED ORDER — INSULIN GLARGINE 100 UNIT/ML ~~LOC~~ SOLN
38.0000 [IU] | Freq: Every day | SUBCUTANEOUS | Status: DC
  Administered 2014-04-06: 38 [IU] via SUBCUTANEOUS
  Filled 2014-04-05: qty 0.38

## 2014-04-05 MED ORDER — METOPROLOL TARTRATE 25 MG PO TABS
25.0000 mg | ORAL_TABLET | Freq: Every day | ORAL | Status: DC
  Administered 2014-04-05: 25 mg via ORAL
  Filled 2014-04-05: qty 1

## 2014-04-05 MED ORDER — BIVALIRUDIN 250 MG IV SOLR
INTRAVENOUS | Status: AC
  Filled 2014-04-05: qty 250

## 2014-04-05 MED ORDER — ASPIRIN 81 MG PO CHEW
81.0000 mg | CHEWABLE_TABLET | ORAL | Status: AC
  Administered 2014-04-05: 81 mg via ORAL

## 2014-04-05 MED ORDER — ASPIRIN EC 81 MG PO TBEC
81.0000 mg | DELAYED_RELEASE_TABLET | Freq: Every day | ORAL | Status: DC
  Administered 2014-04-06: 81 mg via ORAL
  Filled 2014-04-05: qty 1

## 2014-04-05 MED ORDER — NITROGLYCERIN 0.4 MG SL SUBL: 0.4000 mg | SUBLINGUAL_TABLET | SUBLINGUAL | Status: DC | PRN

## 2014-04-05 MED ORDER — CLOPIDOGREL BISULFATE 300 MG PO TABS
ORAL_TABLET | ORAL | Status: AC
  Filled 2014-04-05: qty 2

## 2014-04-05 MED ORDER — MIDAZOLAM HCL 2 MG/2ML IJ SOLN
INTRAMUSCULAR | Status: AC
  Filled 2014-04-05: qty 2

## 2014-04-05 MED ORDER — FENTANYL CITRATE 0.05 MG/ML IJ SOLN
INTRAMUSCULAR | Status: AC
  Filled 2014-04-05: qty 2

## 2014-04-05 MED ORDER — NITROGLYCERIN 1 MG/10 ML FOR IR/CATH LAB
INTRA_ARTERIAL | Status: AC
  Filled 2014-04-05: qty 10

## 2014-04-05 MED ORDER — PANTOPRAZOLE SODIUM 40 MG PO TBEC
40.0000 mg | DELAYED_RELEASE_TABLET | Freq: Every day | ORAL | Status: DC
  Administered 2014-04-05 – 2014-04-06 (×2): 40 mg via ORAL
  Filled 2014-04-05 (×2): qty 1

## 2014-04-05 MED ORDER — SODIUM CHLORIDE 0.9 % IJ SOLN
3.0000 mL | INTRAMUSCULAR | Status: DC | PRN
Start: 2014-04-05 — End: 2014-04-05

## 2014-04-05 MED ORDER — CLOPIDOGREL BISULFATE 75 MG PO TABS
75.0000 mg | ORAL_TABLET | Freq: Every day | ORAL | Status: DC
  Administered 2014-04-06: 75 mg via ORAL
  Filled 2014-04-05: qty 1

## 2014-04-05 MED ORDER — NITROGLYCERIN IN D5W 200-5 MCG/ML-% IV SOLN
INTRAVENOUS | Status: AC
  Filled 2014-04-05: qty 250

## 2014-04-05 MED ORDER — ASPIRIN 81 MG PO CHEW
CHEWABLE_TABLET | ORAL | Status: AC
  Filled 2014-04-05: qty 1

## 2014-04-05 MED ORDER — ACETAMINOPHEN 325 MG PO TABS: 650.0000 mg | ORAL_TABLET | ORAL | Status: DC | PRN

## 2014-04-05 MED ORDER — NITROGLYCERIN IN D5W 200-5 MCG/ML-% IV SOLN: 5.0000 ug/min | INTRAVENOUS | Status: DC

## 2014-04-05 MED ORDER — SIMVASTATIN 40 MG PO TABS
40.0000 mg | ORAL_TABLET | Freq: Every day | ORAL | Status: DC
  Administered 2014-04-05: 22:00:00 40 mg via ORAL
  Filled 2014-04-05 (×2): qty 1

## 2014-04-05 MED ORDER — LIDOCAINE HCL (PF) 1 % IJ SOLN
INTRAMUSCULAR | Status: AC
  Filled 2014-04-05: qty 30

## 2014-04-05 MED ORDER — ONDANSETRON HCL 4 MG PO TABS: 4.0000 mg | ORAL_TABLET | Freq: Three times a day (TID) | ORAL | Status: DC | PRN

## 2014-04-05 MED ORDER — ONDANSETRON HCL 4 MG/2ML IJ SOLN: 4.0000 mg | Freq: Four times a day (QID) | INTRAMUSCULAR | Status: DC | PRN

## 2014-04-05 MED ORDER — ASPIRIN 81 MG PO TABS: 81.0000 mg | ORAL_TABLET | Freq: Every day | ORAL | Status: DC

## 2014-04-05 MED ORDER — SODIUM CHLORIDE 0.9 % IV SOLN
INTRAVENOUS | Status: DC
  Administered 2014-04-05: 09:00:00 via INTRAVENOUS

## 2014-04-05 MED ORDER — HEPARIN (PORCINE) IN NACL 2-0.9 UNIT/ML-% IJ SOLN
INTRAMUSCULAR | Status: AC
  Filled 2014-04-05: qty 1000

## 2014-04-05 MED ORDER — SODIUM CHLORIDE 0.9 % IV SOLN
INTRAVENOUS | Status: DC
  Administered 2014-04-05: 15:00:00 via INTRAVENOUS

## 2014-04-05 NOTE — Interval H&P Note (Signed)
Cath Lab Visit (complete for each Cath Lab visit)  Clinical Evaluation Leading to the Procedure:   ACS: No.  Non-ACS:    Anginal Classification: CCS IV  Anti-ischemic medical therapy: Maximal Therapy (2 or more classes of medications)  Non-Invasive Test Results: No non-invasive testing performed  Prior CABG: Previous CABG      History and Physical Interval Note:  04/05/2014 10:44 AM  [name]  has presented today for surgery, with the diagnosis of unstable angina  The various methods of treatment have been discussed with the patient and family. After consideration of risks, benefits and other options for treatment, the patient has consented to  Procedure(s): LEFT HEART CATHETERIZATION WITH CORONARY/GRAFT ANGIOGRAM (N/A) as a surgical intervention .  The patient's history has been reviewed, patient examined, no change in status, stable for surgery.  I have reviewed the patient's chart and labs.  Questions were answered to the patient's satisfaction.     Drenda Sobecki A

## 2014-04-05 NOTE — Progress Notes (Signed)
Site area: right groin  Site Prior to Removal:  Level 0  Pressure Applied For 20 MINUTES    Minutes Beginning at 1805  Manual:   Yes.    Patient Status During Pull:  AAO x 4  Post Pull Groin Site:  Level 0  Post Pull Instructions Given:  Yes.    Post Pull Pulses Present:  Yes.    Dressing Applied:  Yes.    Comments:  Tolerated procedure well

## 2014-04-05 NOTE — CV Procedure (Signed)
[name] is a 68 y.o. male    [id number]  [id number] LOCATION:  FACILITY: [facility]  PHYSICIAN: [name], MD, [facility] 26-Jul-1945   DATE OF PROCEDURE:  04/05/2014    CARDIAC CATHETERIZATION/PERCUTANEOUS CORONOARY INTERVENTION    HISTORY:    [name] is a 68 y.o. male who has known CAD and underwent CABG surgery in 2006.  The LIMA to his LAD, a sequential graft supplying a diagonal vessel and obtuse marginal vessel, and vein graft to his RCA.  He also status post stent grafting to his aortoiliac system.  He has history of diabetes mellitus, anemia, and recently has experienced recurrent episodes of a burning sensation.  He was seen in the office by Dr. Sallyanne Kuster and due to concerns for accelerated anginal pattern, he now presents for definitive cardiac catheterization and possible percutaneous coronary intervention.   PROCEDURE: Left heart catheterization: Coronary angiography in the native coronary arteries and saphenous vein grafts, left internal mammary artery; left ventriculography; distal aortography with runoff; percutaneous coronary intervention of a very large, dominant native RCA with Angiosculpt scoring balloon, noncompliant balloon dilatations, 4.5x24 mm Rebel bare-metal stent post dilated to 5.0 mm.  The patient was brought to the [facility] cardiac catherization labaratory in the fasting state. He  was premedicated with Versed 2 mg and fentanyl 50 mcg. His right groin was prepped and shaved in usual sterile fashion. Xylocaine 1% was used for local anesthesia. A 5 French sheath was inserted into the R femoral artery. Diagnostic catheterizatiion was done with 5 [place] FL4, FR4, LIMA, LBG and pigtail catheters. Left ventriculography was done with 25 cc Omnipaque contrast.  With his previous abdominal aortic and iliac stent graft distal aortography was also performed with 25 cc of contrast.  With the demonstration of total occlusion of the vein graft, which had supplied the RCA  and with a 95% proximal RCA stenosis and a very large, dominant vessel the decision was made to perform intervention.  The findings were reviewed with the patient who agreed to pursue with the interventional procedure. The 5 French sheath was upgraded to a 6 [place] sheath.  Angiomax bolus plus infusion was administered.  Plavix 600 mg was given orally.  Initially a 6 [place] FR4 guide was used for the procedure.  After demonstration of therapeutic anticoagulation, a Prowler wire was advanced down the right artery.  Due to the significant plaque burden in this very large vessel, initial intervention was done with Angiosculpt scoring balloon 2.5x15 mm.  3 inflations were made at 8, 10, and 12 atmospheres.  This was  removed and exchanged for a 3.0x15 mm Angiosculpt.  Several dilatations were made with this scoring balloon, but the lesion was never able to be completely crossed distally.  A 3.0x20 mm [place] Euphora balloon was then used and dilated at 10 and 16 atmospheres. An  [place] Emerge 3.75x20 mm balloon was then inserted and 4 inflations were made up to 12 atmospheres.  It was felt that the vessel was a 5 to vessel.  A 4.0x28 mm Ultra bare-metal stent was then attempted to be inserted but this never  traversed the lesion.  Due to very poor catheter back up, the entire system was removed.  A 6 French hockey-stick guide was inserted.  The prowater wire was readvanced down the vessel.  Additional dilatation with the 3.75 [place] balloon was made.  A Rebel 4.5x24 mm BM stent was then successfully inserted and dilated at 14 and 16 atmospheres.  A 5.0x20  mm [place] Trek was then inserted with dilatation up to 13 atmospheres corresponding to 5.0 mm size.  With each balloon inflation, the patient experienced his typical burning sensation, which is his anginal equivalent.  Angiography confirmed an excellent angiographic result.  There was brisk TIMI-3 flow.  There is no evidence for dissection.  The arterial sheath was sutured in place  with plans to continue bivalirudin at reduced dose for 2 hours.  He left the catheterization laboratory, chest pain free with stable hemodynamics.  HEMODYNAMICS:   Central Aorta: 170/92   Left Ventricle: 170/15  ANGIOGRAPHY:  Left main: Large-caliber vessel, which essentially gave rise to the circumflex coronary artery since the LAD was occluded at its origin and not visualized antegrade.  LAD: Totally occluded at the ostium and not visualize from the LM injection  Left circumflex: 60% ostial stenosis  95% stenosis filling avery small AV groove circumflex   Right coronary artery: Very large, dominant RCA with 95% proximal stenosis, luminal irregularity in its mid segment, with filling of the PDA and PLA vessel without evidence for any competitive filling.  LIMA to LAD: Widely patent and anastomosed into the mid LAD.  The LAD filled retrograde proximally and gave rise to 2 septal proximal perforating arteries.  Beyond the LIMA insertion, the LAD extended to the apex and was free of significant disease.  SVG to the diagonal and circumflex vessel was originally a sequential graft.  The diagonal limb is occluded.  The proximal vein graft has 60% stenosis in the proximal third of the graft.  There is mild luminal irregularity throughout the graft without other significant stenoses.  There is faint collateralization to the diagonal vessel.  SVG to PDA is occluded at its origin.   Left ventriculography revealed moderate LV dysfunction with an ejection fraction of 35 to 40% with severe hypo-to akinesis involving the mid distal anterolateral wall and apex with good contractility.  The basal segment and mid to basal inferior wall.   Distal Aortagraphy revealed a widely patent stent graft commencing infrarenally with bilateral extension into both common iliac arteries.  Following successful PCI to the very large, dominant RCA utilizing Angiosculpt cutting balloon, noncompliant balloon dilatations,  ultimate insertion of a Rebel 4.5x24 mm bare-metal stent post dilated to 5.0 mm, the 95% stenosis was reduced to 0%.  There was brisk TIMI-3 flow.  There was no evidence for dissection.  IMPRESSION:  Ischemic cardiomyopathy with severe hypo-akinesis involving the mid distal anterolateral wall and apex with an ejection fraction of 35-40%.  Significant native coronary artery disease with total ostial occlusion of the LAD, 60% ostial circumflex stenosis, with 95% proximal stenosis supplying a diminutive size AV groove; and 95% stenosis in a large, dominant right coronary artery with luminal irregularity.  Occluded vein graft, which previously had supplied the PDA.  Saphenous vein graft, which previously was a sequential graft supplying the diagonal, and the marginal vessel with 60% proximal stenosis in the proximal third of the graft with additional luminal irregularity and total occlusion of the limb which previously had supplied the diagonal vessel.   Widely patent abdominal aortoiliac stent graft.  Successful PCI to the very large, dominant RCA utilizing Angiosculpt scoring balloon, noncompliant balloon dilatations, insertion of a 4.5x24 mm rectal bare-metal stent post dilated to 5.0 mm at the 95% stenosis being reduced to 0%.  Bivalirudin/600 mg oral Plavix/ICN IV nitroglycerin  RECOMMENDATION:  The patient experienced his typical burning sensation with each balloon inflation, which is his anginal equivalent.  He  does have occluded limb of his graft which had supplied the diagonal vessel, as well as 95% stenosis of a small AV groove circumflex.  Medical therapy will be initially recommended for his concomitant disease.   [name], MD, [facility] 04/05/2014 1:12 PM

## 2014-04-05 NOTE — H&P (View-Only) (Signed)
Patient ID: PHARRELL LEDFORD, male   DOB: 04/14/1946, 68 y.o.   MRN: [id number]      Reason for office visit Unstable angina  [place] has not had coronary problems since his bypass surgery in 2006, but started having worrisome complaints last week. Seven days ago, he had abrupt onset of a burning sensation in his throat and both shoulders and diaphoresis. Onset was at rest and he thinks that standing up helped the burning. It was not related to a meal. The same phenomenon happened the next day, again at rest and a few milder episodes over the weekend. No further problems in the last 2-3 days, but he has an uneasy feeling. He thinks he's had more belching. He has not had dyspnea. When asking about his previous anginal symptoms, he primarily recalls dyspnea as his main complaint before surgery, but seems to recall a similar burning sensation in his neck.  He has a history of coronary disease as well as peripheral arterial disease. He underwent four-vessel bypass surgery in 2006 (LIMA-LAD, SVG x 3 to Diagonal, OM and PDA). A nuclear stress test in 2011 was normal. He has normal left ventricular systolic function. He has received a stent in the large right common iliac artery aneurysm and is monitored by Dr. Kellie Simmering. He has insulin requiring diabetes mellitus and his hemoglobin A1c has consistently been around 6.9%. He has chronically low HDL cholesterol , around 27 mg/dL. Over the last year his LDL has oscillated between 70 and 100 mg/dL, Triglycerides are typically over 200.    Allergies  Allergen Reactions  . Codeine Nausea Only    Cannot take on empty stomach.  . Valium Nausea Only    Cannot take on empty stomach.    Current Outpatient Prescriptions  Medication Sig Dispense Refill  . aspirin 81 MG tablet Take 81 mg by mouth daily.        . clotrimazole-betamethasone (LOTRISONE) cream       . insulin glargine (LANTUS) 100 UNIT/ML injection Inject 38 Units into the skin every morning.       .  metoprolol tartrate (LOPRESSOR) 25 MG tablet Take 25 mg by mouth daily.       [name] omeprazole (PRILOSEC) 40 MG capsule Take 1 capsule (40 mg total) by mouth daily.  30 capsule  3  . ondansetron (ZOFRAN) 8 MG tablet Take 4 mg by mouth every 8 (eight) hours as needed for nausea or vomiting.      . ONE TOUCH ULTRA TEST test strip       . simvastatin (ZOCOR) 40 MG tablet Take 1 tablet (40 mg total) by mouth at bedtime.  90 tablet  3   No current facility-administered medications for this visit.    Past Medical History  Diagnosis Date  . Diabetes mellitus   . Hyperlipidemia   . Hypertension   . Myocardial infarction 2006  . GERD (gastroesophageal reflux disease)   . Joint pain   . Dizziness   . CAD (coronary artery disease)   . Iliac artery aneurysm   . Lower back pain Jan 2015  . Atrial fibrillation   . CAD s/p CABG 2006 08/16/2013    Dr vanTrigt - to LAD, SVG to diagonal, SVG to OM, SVG to PDA   . Mixed hyperlipidemia 08/16/2013  . DM type 2 causing complication 10/17/5460    Past Surgical History  Procedure Laterality Date  . Coronary artery bypass graft  2006    LIMA to LAD,SVG to diagonal,SVG  to obtuse marginal,SVG to posterior descending  . Iliac artery stent Left Aug. 10, 2011    Aorta to right ext iliac and left CIA stent repair   . Nm myocar perf wall motion  11/18/2009    low risk    Family History  Problem Relation Age of Onset  . Diabetes Mother   . Hypertension Mother   . Heart disease Mother     AAA  . Heart disease Sister   . Heart disease Son     Heart Disease before age 65    History   Social History  . Marital Status: Married    Spouse Name: N/A    Number of Children: N/A  . Years of Education: N/A   Occupational History  . Not on file.   Social History Main Topics  . Smoking status: Never Smoker   . Smokeless tobacco: Never Used  . Alcohol Use: No  . Drug Use: No  . Sexual Activity: Not on file   Other Topics Concern  . Not on file   Social  History Narrative  . No narrative on file    Review of systems: The patient specifically denies any chest pain with exertion, dyspnea at rest or with exertion, orthopnea, paroxysmal nocturnal dyspnea, syncope, palpitations, focal neurological deficits, intermittent claudication, lower extremity edema, unexplained weight gain, cough, hemoptysis or wheezing.  The patient also denies abdominal pain, nausea, vomiting, dysphagia, diarrhea, constipation, polyuria, polydipsia, dysuria, hematuria, frequency, urgency, abnormal bleeding or bruising, fever, chills, unexpected weight changes, mood swings, change in skin or hair texture, change in voice quality, auditory or visual problems, allergic reactions or rashes, new musculoskeletal complaints other than usual "aches and pains".  PHYSICAL EXAM BP 154/98  Pulse 81  Ht 5\' 8"  (1.727 m)  Wt 86.456 kg (190 lb 9.6 oz)  BMI 28.99 kg/m2 General: Alert, oriented x3, no distress  Head: no evidence of trauma, PERRL, EOMI, no exophtalmos or lid lag, no myxedema, no xanthelasma; normal ears, nose and oropharynx  Neck: normal jugular venous pulsations and no hepatojugular reflux; brisk carotid pulses without delay and no carotid bruits  Chest: clear to auscultation, no signs of consolidation by percussion or palpation, normal fremitus, symmetrical and full respiratory excursions, sternotomy scar  Cardiovascular: normal position and quality of the apical impulse, regular rhythm, normal first and second heart sounds, no murmurs, rubs or gallops  Abdomen: no tenderness or distention, no masses by palpation, no abnormal pulsatility or arterial bruits, normal bowel sounds, no hepatosplenomegaly  Extremities: no clubbing, cyanosis or edema; 2+ radial, ulnar and brachial pulses bilaterally; 2+ right femoral, posterior tibial and dorsalis pedis pulses; 2+ left femoral, posterior tibial and dorsalis pedis pulses; no subclavian or femoral bruits  Neurological: grossly  nonfocal  EKG: NSR, new anterolateral T wave inversion  Lipid Panel  July 26, 2013 (Dr. Minna Antis)  Total cholesterol 173, HDL 27, LDL 99, triglycerides 234  Hemoglobin A1c 6.8%  Creatinine 0.93 normal LFTs   BMET    Component Value Date/Time   NA 142 02/03/2014 0457   K 4.3 02/03/2014 0457   CL 108 02/03/2014 0457   CO2 24 02/03/2014 0457   GLUCOSE 107* 02/03/2014 0457   BUN 16 02/03/2014 0457   CREATININE 1.09 02/03/2014 0457   CALCIUM 8.5 02/03/2014 0457   GFRNONAA 68* 02/03/2014 0457   GFRAA 79* 02/03/2014 0457     ASSESSMENT AND PLAN   I think Mr. Kise has complaints consistent with unstable angina pectoris and he has  ischemic ECG changes. He has known CAD, 9 years after multivessel CABG. He has been relatively well for the last 72 hours. I believe he needs urgent, but not emergency cardiac catheterization. Will schedule ASAP, within the next 24-48 hours. He needs to present to the ED/call 911 immediately if his chest burning sensation lasts more than 20-30 minutes and is not relieved by SL NTG x 3. This procedure has been fully reviewed with the patient and written informed consent has been obtained.  Orders Placed This Encounter  Procedures  . CBC  . Comprehensive metabolic panel  . APTT  . Protime-INR  . EKG 12-Lead  . LEFT HEART CATHETERIZATION WITH CORONARY ANGIOGRAM   Meds ordered this encounter  Medications  . clotrimazole-betamethasone (LOTRISONE) cream    Sig:   . ONE TOUCH ULTRA TEST test strip    Sig:     Holli Humbles, MD, [facility] [phone] office [phone] pager

## 2014-04-06 ENCOUNTER — Encounter (HOSPITAL_COMMUNITY): Payer: Self-pay | Admitting: Physician Assistant

## 2014-04-06 ENCOUNTER — Other Ambulatory Visit: Payer: Self-pay | Admitting: Physician Assistant

## 2014-04-06 DIAGNOSIS — I200 Unstable angina: Secondary | ICD-10-CM

## 2014-04-06 DIAGNOSIS — E782 Mixed hyperlipidemia: Secondary | ICD-10-CM

## 2014-04-06 DIAGNOSIS — I25810 Atherosclerosis of coronary artery bypass graft(s) without angina pectoris: Secondary | ICD-10-CM | POA: Diagnosis not present

## 2014-04-06 DIAGNOSIS — I255 Ischemic cardiomyopathy: Secondary | ICD-10-CM

## 2014-04-06 DIAGNOSIS — I209 Angina pectoris, unspecified: Secondary | ICD-10-CM

## 2014-04-06 DIAGNOSIS — I2589 Other forms of chronic ischemic heart disease: Secondary | ICD-10-CM

## 2014-04-06 DIAGNOSIS — I10 Essential (primary) hypertension: Secondary | ICD-10-CM

## 2014-04-06 DIAGNOSIS — E119 Type 2 diabetes mellitus without complications: Secondary | ICD-10-CM | POA: Diagnosis not present

## 2014-04-06 DIAGNOSIS — I2510 Atherosclerotic heart disease of native coronary artery without angina pectoris: Secondary | ICD-10-CM | POA: Diagnosis not present

## 2014-04-06 LAB — CBC
HCT: 39.8 % (ref 39.0–52.0)
Hemoglobin: 13.9 g/dL (ref 13.0–17.0)
MCH: 29.6 pg (ref 26.0–34.0)
MCHC: 34.9 g/dL (ref 30.0–36.0)
MCV: 84.9 fL (ref 78.0–100.0)
Platelets: 134 10*3/uL — ABNORMAL LOW (ref 150–400)
RBC: 4.69 MIL/uL (ref 4.22–5.81)
RDW: 12.7 % (ref 11.5–15.5)
WBC: 8.6 10*3/uL (ref 4.0–10.5)

## 2014-04-06 LAB — BASIC METABOLIC PANEL
Anion gap: 13 (ref 5–15)
BUN: 11 mg/dL (ref 6–23)
CO2: 23 mEq/L (ref 19–32)
Calcium: 8.4 mg/dL (ref 8.4–10.5)
Chloride: 102 mEq/L (ref 96–112)
Creatinine, Ser: 0.97 mg/dL (ref 0.50–1.35)
GFR calc Af Amer: >90 mL/min (ref >90)
GFR calc non Af Amer: 83 mL/min — ABNORMAL LOW (ref >90)
Glucose, Bld: 191 mg/dL — ABNORMAL HIGH (ref 70–99)
Potassium: 3.8 mEq/L (ref 3.7–5.3)
Sodium: 138 mEq/L (ref 137–147)

## 2014-04-06 LAB — GLUCOSE, CAPILLARY: Glucose-Capillary: 184 mg/dL — ABNORMAL HIGH (ref 70–99)

## 2014-04-06 MED ORDER — METOPROLOL TARTRATE 25 MG PO TABS
25.0000 mg | ORAL_TABLET | Freq: Two times a day (BID) | ORAL | Status: DC
  Administered 2014-04-06: 09:00:00 25 mg via ORAL

## 2014-04-06 MED ORDER — METOPROLOL TARTRATE 25 MG PO TABS: 25.0000 mg | ORAL_TABLET | Freq: Two times a day (BID) | ORAL | Status: DC

## 2014-04-06 MED ORDER — PANTOPRAZOLE SODIUM 40 MG PO TBEC: 40.0000 mg | DELAYED_RELEASE_TABLET | Freq: Every day | ORAL | Status: DC

## 2014-04-06 MED ORDER — LOSARTAN POTASSIUM 25 MG PO TABS
25.0000 mg | ORAL_TABLET | Freq: Every day | ORAL | Status: DC
  Filled 2014-04-06: qty 1

## 2014-04-06 MED ORDER — CLOPIDOGREL BISULFATE 75 MG PO TABS: 75.0000 mg | ORAL_TABLET | Freq: Every day | ORAL | Status: DC

## 2014-04-06 MED ORDER — LOSARTAN POTASSIUM 25 MG PO TABS: 25.0000 mg | ORAL_TABLET | Freq: Every day | ORAL | Status: DC

## 2014-04-06 NOTE — Discharge Instructions (Signed)
Do not take Prilosec (omeprazole).  It affects how Plavix works.  You have been given a prescription for Protonix instead.  Do not miss any dose of Aspirin or Plavix.

## 2014-04-06 NOTE — Discharge Summary (Signed)
Discharge Summary   Patient ID: [name], MRN: [id number], DOB/AGE: 68-Nov-1947 68 y.o.  Admit date: 04/05/2014 Discharge date: 04/06/2014   Primary Care Physician:  [name]   Primary Cardiologist:  Dr. Sanda Klein   Reason for Admission:  Unstable Angina   Primary Discharge Diagnoses:  Principal Problem:   Unstable angina Active Problems:   Hypertension   CAD s/p CABG 2006   DM type 2 causing complication   Mixed hyperlipidemia   Ischemic cardiomyopathy     Wt Readings from Last 3 Encounters:  04/06/14 185 lb 10 oz (84.2 kg)  04/06/14 185 lb 10 oz (84.2 kg)  04/03/14 190 lb 9.6 oz (86.456 kg)    Secondary Discharge Diagnoses:   Past Medical History  Diagnosis Date  . Hypertension   . GERD (gastroesophageal reflux disease)   . Joint pain   . Dizziness   . CAD (coronary artery disease)   . Iliac artery aneurysm   . Lower back pain Jan 2015  . Atrial fibrillation   . CAD s/p CABG 2006 08/16/2013    a. Dr Darcey Nora - to LAD, SVG to diagonal, SVG to OM, SVG to PDA ;  b.  LHC (9/15): ostial LAD occl, ostial CFX 60%, prox AVCFX 95%, RCA 95%, S-PDA occl, S-OM/Dx with Dx limb occl and prox 60%, L-LAD patent, EF 35-40% wtih ant-lat and apical HK-AK >> PCI:  BMS to native RCA   . Mixed hyperlipidemia 08/16/2013  . Myocardial infarction 03/2014 X 2?  [name] Type II diabetes mellitus   . DM type 2 causing complication 4/0/9811  . Arthritis     "back" (04/05/2014)  . Kidney stones   . Ischemic cardiomyopathy     a. EF 35-40% by LV gram 03/2014      Allergies:    Allergies  Allergen Reactions  . Codeine Nausea Only    Cannot take on empty stomach.  . Valium Nausea Only    Cannot take on empty stomach.      Procedures Performed This Admission:    1.  Cardiac Catheterization and Percutaneous Coronary Intervention:  ANGIOGRAPHY:  Left main: Large-caliber vessel, which essentially gave rise to the circumflex coronary artery since the LAD was occluded at its  origin and not visualized antegrade.  LAD: Totally occluded at the ostium and not visualize from the LM injection  Left circumflex: 60% ostial stenosis 95% stenosis filling avery small AV groove circumflex  Right coronary artery: Very large, dominant RCA with 95% proximal stenosis, luminal irregularity in its mid segment, with filling of the PDA and PLA vessel without evidence for any competitive filling.  LIMA to LAD: Widely patent and anastomosed into the mid LAD. The LAD filled retrograde proximally and gave rise to 2 septal proximal perforating arteries. Beyond the LIMA insertion, the LAD extended to the apex and was free of significant disease.  SVG to the diagonal and circumflex vessel was originally a sequential graft. The diagonal limb is occluded. The proximal vein graft has 60% stenosis in the proximal third of the graft. There is mild luminal irregularity throughout the graft without other significant stenoses. There is faint collateralization to the diagonal vessel.  SVG to PDA is occluded at its origin.   Left ventriculography revealed moderate LV dysfunction with an ejection fraction of 35 to 40% with severe hypo-to akinesis involving the mid distal anterolateral wall and apex with good contractility. The basal segment and mid to basal inferior wall.   Distal Aortagraphy revealed a widely patent  stent graft commencing infrarenally with bilateral extension into both common iliac arteries.   Following successful PCI to the very large, dominant RCA utilizing Angiosculpt cutting balloon, noncompliant balloon dilatations, ultimate insertion of a Rebel 4.5x24 mm bare-metal stent post dilated to 5.0 mm, the 95% stenosis was reduced to 0%. There was brisk TIMI-3 flow. There was no evidence for dissection.   IMPRESSION:  Ischemic cardiomyopathy with severe hypo-akinesis involving the mid distal anterolateral wall and apex with an ejection fraction of 35-40%.  Significant native coronary artery  disease with total ostial occlusion of the LAD, 60% ostial circumflex stenosis, with 95% proximal stenosis supplying a diminutive size AV groove; and 95% stenosis in a large, dominant right coronary artery with luminal irregularity.  Occluded vein graft, which previously had supplied the PDA.  Saphenous vein graft, which previously was a sequential graft supplying the diagonal, and the marginal vessel with 60% proximal stenosis in the proximal third of the graft with additional luminal irregularity and total occlusion of the limb which previously had supplied the diagonal vessel.  Widely patent abdominal aortoiliac stent graft.  Successful PCI to the very large, dominant RCA utilizing Angiosculpt scoring balloon, noncompliant balloon dilatations, insertion of a 4.5x24 mm rectal bare-metal stent post dilated to 5.0 mm at the 95% stenosis being reduced to 0%.  Bivalirudin/600 mg oral Plavix/ICN IV nitroglycerin   RECOMMENDATION:  The patient experienced his typical burning sensation with each balloon inflation, which is his anginal equivalent. He does have occluded limb of his graft which had supplied the diagonal vessel, as well as 95% stenosis of a small AV groove circumflex. Medical therapy will be initially recommended for his concomitant disease.     Hospital Course:  DAMION KANT is a 68 y.o. male with a history of coronary disease as well as peripheral arterial disease. He underwent four-vessel bypass surgery in 2006 (LIMA-LAD, SVG x 3 to Diagonal, OM and PDA). A nuclear stress test in 2011 was normal. He has normal left ventricular systolic function. He has received a stent in the large right common iliac artery aneurysm and is monitored by Dr. Kellie Simmering. He has insulin requiring diabetes mellitus and his hemoglobin A1c has consistently been around 6.9%. He has chronically low HDL cholesterol , around 27 mg/dL. Over the last year his LDL has oscillated between 70 and 100 mg/dL, Triglycerides are  typically over 200. Recently seen by Dr. Dani Gobble Croitoru in the office for symptoms consistent with unstable angina. Cardiac cath was arranged. This was done yesterday and demonstrated worsening LVF with EF 35-40% and anterolateral and apical hypo-akinesis, S-PDA occluded, S-Dx/OM with prox 60% and dist limb occluded (to diagonal), native AVCFX with 95%, L-LAD patent. Native RCA with prox 95% stenosis treated with BMS. Med Rx recommended for residual disease.  He tolerated the procedure well and had no immediate complications.  He was evaluated this AM by Dr. Rozann Lesches and noted to be chest pain free.  He is felt to be stable for DC to home.  His Lopressor was increased to 25 mg bid (was taking QD) and Losartan 25 mg QD was added in light of his uncontrolled BP and reduced EF.  He may need Metoprolol switched to Coreg based upon BP.  ARB was chosen over ACEI given patient's chronic non-productive cough related to allergic rhinitis.  He will FU with Dr. Sanda Klein or PA/NP in 2 weeks.     Discharge Vitals:   Blood pressure 173/91, pulse 73, temperature 97.9 F (36.6 C),  temperature source Oral, resp. rate 18, height 5\' 9"  (1.753 m), weight 185 lb 10 oz (84.2 kg), SpO2 97.00%.   Labs:   Recent Labs  04/03/14 1648 04/06/14 0358  WBC 10.0 8.6  HGB 15.4 13.9  HCT 44.2 39.8  MCV 84.8 84.9  PLT 148* 134*     Recent Labs  04/03/14 1648 04/06/14 0358  NA 135 138  K 3.9 3.8  CL 101 102  CO2 28 23  BUN 12 11  CREATININE 1.03 0.97  CALCIUM 9.1 8.4  PROT 7.0  --   BILITOT 0.8  --   ALKPHOS 78  --   ALT 18  --   AST 44*  --      Recent Labs  04/03/14 1648  INR 1.03     Diagnostic Procedures and Studies:  No results found.   Disposition:   Pt is being discharged home today in good condition.  Follow-up Plans & Appointments      Follow-up Information   Follow up with CROITORU,MIHAI, MD In 2 weeks. (office will call to arrange follow lab visit (test is called  BMET) and appointment with Dr. Sallyanne Kuster or one of the PAs or NPs in 2 weeks. )    Specialty:  Cardiology   Contact information:   [address] [place] [address] [phone]       Discharge Medications    Medication List    STOP taking these medications       omeprazole 40 MG capsule  Commonly known as:  PRILOSEC  Replaced by:  pantoprazole 40 MG tablet      TAKE these medications       aspirin 81 MG tablet  Take 81 mg by mouth daily.     clopidogrel 75 MG tablet  Commonly known as:  PLAVIX  Take 1 tablet (75 mg total) by mouth daily with breakfast.     insulin glargine 100 UNIT/ML injection  Commonly known as:  LANTUS  Inject 38 Units into the skin every morning.     losartan 25 MG tablet  Commonly known as:  COZAAR  Take 1 tablet (25 mg total) by mouth daily.     metoprolol tartrate 25 MG tablet  Commonly known as:  LOPRESSOR  Take 1 tablet (25 mg total) by mouth 2 (two) times daily.     nitroGLYCERIN 0.4 MG SL tablet  Commonly known as:  NITROSTAT  Place 1 tablet (0.4 mg total) under the tongue every 5 (five) minutes as needed for chest pain (CALL 911 IF NO RELIEF AFTER 20 minutes).     ondansetron 8 MG tablet  Commonly known as:  ZOFRAN  Take 4 mg by mouth every 8 (eight) hours as needed for nausea or vomiting.     ONE TOUCH ULTRA TEST test strip  Generic drug:  glucose blood     pantoprazole 40 MG tablet  Commonly known as:  PROTONIX  Take 1 tablet (40 mg total) by mouth daily.     simvastatin 40 MG tablet  Commonly known as:  ZOCOR  Take 1 tablet (40 mg total) by mouth at bedtime.         Outstanding Labs/Studies  1. BMET at FU office visit (ARB initiated at DC)  Duration of Discharge Encounter: Greater than 30 minutes including physician and PA time.  Signed, Richardson Dopp, PA-C   04/06/2014 9:11 AM

## 2014-04-06 NOTE — Progress Notes (Signed)
Progress Note     Subjective:   Patient has a history of coronary disease as well as peripheral arterial disease. He underwent four-vessel bypass surgery in 2006 (LIMA-LAD, SVG x 3 to Diagonal, OM and PDA). A nuclear stress test in 2011 was normal. He has normal left ventricular systolic function. He has received a stent in the large right common iliac artery aneurysm and is monitored by Dr. Kellie Simmering. He has insulin requiring diabetes mellitus and his hemoglobin A1c has consistently been around 6.9%. He has chronically low HDL cholesterol , around 27 mg/dL. Over the last year his LDL has oscillated between 70 and 100 mg/dL, Triglycerides are typically over 200.  Recently seen for unstable angina.  Cardiac cath was arranged.  This demonstrated worsening LVF with EF 35-40% and anterolateral and apical hypo-akinesis, S-PDA occluded, S-Dx/OM with prox 60% and dist limb occluded (to diagonal), L-LAD patent.  Native RCA with prox 95% stenosis treated with BMS.  Med Rx recommended for residual disease.    Denies CP or dyspnea   Objective:  Filed Vitals:   04/06/14 0017 04/06/14 0430 04/06/14 0752 04/06/14 0754  BP: 146/76 136/79 200/137 173/91  Pulse: 77 66 73   Temp: 98.1 F (36.7 C) 97.7 F (36.5 C) 97.9 F (36.6 C)   TempSrc: Oral Oral Oral   Resp: 18 18 18    Height:      Weight: 185 lb 10 oz (84.2 kg)     SpO2: 97% 97%      Intake/Output from previous day:  Intake/Output Summary (Last 24 hours) at 04/06/14 0842 Last data filed at 04/06/14 0755  Gross per 24 hour  Intake  987.5 ml  Output   1650 ml  Net -662.5 ml    PHYSICAL EXAM: No acute distress Neck: no JVD Cardiac:  normal S1, S2; RRR; no murmur Lungs:  clear to auscultation bilaterally, no wheezing, rhonchi or rales Abd: soft, nontender, no hepatomegaly Ext: no edemaright groin without hematoma or bruit  Skin: warm and dry Neuro:  CNs 2-12 intact, no focal abnormalities noted   Tele:  NSR  Lab Results:  Basic  Metabolic Panel:  Recent Labs  04/03/14 1648 04/06/14 0358  NA 135 138  K 3.9 3.8  CL 101 102  CO2 28 23  GLUCOSE 256* 191*  BUN 12 11  CREATININE 1.03 0.97  CALCIUM 9.1 8.4    CBC:  Recent Labs  04/03/14 1648 04/06/14 0358  WBC 10.0 8.6  HGB 15.4 13.9  HCT 44.2 39.8  MCV 84.8 84.9  PLT 148* 134*    Assessment:   Principal Problem:   Unstable angina Active Problems:   Hypertension   CAD s/p CABG 2006   DM type 2 causing complication   Mixed hyperlipidemia   Ischemic cardiomyopathy    Plan:  Patient is doing well after PCI of native RCA with BMS.  Continue dual antiplatelet Rx with ASA and Plavix.  Continue Simvastatin 40 HS.  BP elevated. Patient takes Metoprolol Tartrate once a day.  Increase Metoprolol to 25 mg bid.  Add Losartan 25 mg QD.  EF now is 35-40%.  Increase beta blocker.  Consider changing to Coreg if BP remains high.  Add ARB (patient has chronic cough from allergies).  Consider FU Echo in 3 mos.  Patient will likely be DC this AM after seen by physician.  Plan FU with Dr. Dani Gobble Croitoru or PA/NP in 2 weeks.  Richardson Dopp, PA-C   04/06/2014 8:42 AM  Pager #  [phone]    Attending note:  Patient seen and examined. Reviewed hospital course. Agree with above assessment by Mr. Jorene Minors. Patient eager to go home. He will be discharged on DAPT, continue Zocor and Lopressor for now, although may need further medication adjustments as an outpatient (possible switch to Coreg and further up titration of antihypertensive treatment). Followup arranged in the next 2 weeks with Dr. Sallyanne Kuster or PA/NP.  Satira Sark, M.D., F.A.C.C.

## 2014-04-06 NOTE — Progress Notes (Addendum)
CARDIAC REHAB PHASE I     POST:  Rate/Rhythm: 79 SR  BP:  Sitting: 173/91  Pt walking in hall upon arrival.  Pt stated he had been ambulating frequently this morning with no c/o and no assist.  Completed education and answered pt questions.  Pt stated he previously participated in cardiac rehab in the past, but did not complete program.  He is not interested in cardiac rehab at this time and stated he has exercise equipment at home.  Pt voiced understanding and did not have further questions at this time. [id number]   Lillia Dallas MS, ACSM RCEP 8:24 AM 04/06/2014

## 2014-04-06 NOTE — Discharge Summary (Signed)
Please see rounding note as well.

## 2014-04-08 ENCOUNTER — Telehealth: Payer: Self-pay | Admitting: Cardiovascular Disease

## 2014-04-08 MED FILL — Sodium Chloride IV Soln 0.9%: INTRAVENOUS | Qty: 50 | Status: AC

## 2014-04-08 NOTE — Telephone Encounter (Signed)
Closed encounter °

## 2014-04-23 ENCOUNTER — Ambulatory Visit: Payer: Medicare Other | Admitting: Cardiology

## 2014-04-30 ENCOUNTER — Ambulatory Visit (INDEPENDENT_AMBULATORY_CARE_PROVIDER_SITE_OTHER): Payer: Medicare Other | Admitting: Physician Assistant

## 2014-04-30 ENCOUNTER — Encounter: Payer: Self-pay | Admitting: Physician Assistant

## 2014-04-30 VITALS — BP 159/85 | HR 79 | Ht 69.0 in | Wt 189.0 lb

## 2014-04-30 DIAGNOSIS — I4581 Long QT syndrome: Secondary | ICD-10-CM

## 2014-04-30 DIAGNOSIS — E782 Mixed hyperlipidemia: Secondary | ICD-10-CM

## 2014-04-30 DIAGNOSIS — I2510 Atherosclerotic heart disease of native coronary artery without angina pectoris: Secondary | ICD-10-CM

## 2014-04-30 DIAGNOSIS — R9431 Abnormal electrocardiogram [ECG] [EKG]: Secondary | ICD-10-CM | POA: Insufficient documentation

## 2014-04-30 DIAGNOSIS — I255 Ischemic cardiomyopathy: Secondary | ICD-10-CM

## 2014-04-30 DIAGNOSIS — I10 Essential (primary) hypertension: Secondary | ICD-10-CM

## 2014-04-30 DIAGNOSIS — I4891 Unspecified atrial fibrillation: Secondary | ICD-10-CM

## 2014-04-30 MED ORDER — LOSARTAN POTASSIUM 50 MG PO TABS: 50.0000 mg | ORAL_TABLET | Freq: Every day | ORAL | Status: DC

## 2014-04-30 NOTE — Assessment & Plan Note (Signed)
Patient appears euvolemic.  Discussed daily weight monitoring and low sodium diet.  Not currently on diuretic.

## 2014-04-30 NOTE — Assessment & Plan Note (Addendum)
Although the patient feels tired and fatigued, he is not experiencing any angina. 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

## 2014-04-30 NOTE — Assessment & Plan Note (Signed)
Maintaining normal sinus rhythm with a rate of 79 beats per minute

## 2014-04-30 NOTE — Assessment & Plan Note (Addendum)
Checking Magnesium.  Not on any QT prolonging drugs

## 2014-04-30 NOTE — Patient Instructions (Addendum)
Your physician recommends that you return for lab work TODAY.  Your physician has recommended you make the following change in your medication: increase the losartan to 50 mg daily . You can take (2) of your 25mg  tablets until completed then start the 50 mg tablet prescription. A new prescription has been sent to your pharmacy.  Your Physician's Assistant recommends that you schedule a follow-up appointment in: 3 months with Dr. Sallyanne Kuster.

## 2014-04-30 NOTE — Assessment & Plan Note (Signed)
On Zocor 

## 2014-04-30 NOTE — Progress Notes (Signed)
Date:  04/30/2014   ID:  [name], DOB July 18, 1945, MRN [id number]  PCP:  [name], MD  Primary Cardiologist:  Croitoru     History of Present Illness: [name] is a 68 y.o. male with a history of coronary disease as well as peripheral arterial disease. He underwent four-vessel bypass surgery in 2006 (LIMA-LAD, SVG x 3 to Diagonal, OM and PDA). A nuclear stress test in 2011 was normal. He has normal left ventricular systolic function. He has received a stent in the large right common iliac artery aneurysm and is monitored by Dr. Kellie Simmering. He has insulin requiring diabetes mellitus and his hemoglobin A1c has consistently been around 6.9%. He has chronically low HDL cholesterol , around 27 mg/dL. Over the last year his LDL has oscillated between 70 and 100 mg/dL, Triglycerides are typically over 200. Recently seen by Dr. Dani Gobble Croitoru in the office for symptoms consistent with unstable angina. Cardiac cath was arranged. This was done on 04/05/14 and demonstrated worsening LVF with EF 35-40% and anterolateral and apical hypo-akinesis, S-PDA occluded, S-Dx/OM with prox 60% and dist limb occluded (to diagonal), native AVCFX with 95%, L-LAD patent. Native RCA with prox 95% stenosis treated with BMS. Med Rx recommended for residual disease. He tolerated the procedure well and had no immediate complications.  His Lopressor was increased to 25 mg bid (was taking QD) and Losartan 25 mg QD was added in light of his uncontrolled BP and reduced EF. He may need Metoprolol switched to Coreg based upon BP. ARB was chosen over ACEI given patient's chronic non-productive cough related to allergic rhinitis.   Patient presented today for post hospital evaluation. He reports that there's been no more chest burning sensation. It has dull feeling around the base of his neck in the front but feels like he has no strength and fatigue. 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  He has no symptoms of heart failure.    He denies nausea, vomiting, fever, chest pain, shortness of breath, orthopnea, dizziness, PND, cough, congestion, abdominal pain, hematochezia, melena, lower extremity edema, claudication.  Wt Readings from Last 3 Encounters:  04/30/14 189 lb (85.73 kg)  04/06/14 185 lb 10 oz (84.2 kg)  04/06/14 185 lb 10 oz (84.2 kg)     Past Medical History  Diagnosis Date  . Hypertension   . GERD (gastroesophageal reflux disease)   . Joint pain   . Dizziness   . CAD (coronary artery disease)   . Iliac artery aneurysm   . Lower back pain Jan 2015  . Atrial fibrillation   . CAD s/p CABG 2006 08/16/2013    a. Dr Darcey Nora - to LAD, SVG to diagonal, SVG to OM, SVG to PDA ;  b.  LHC (9/15): ostial LAD occl, ostial CFX 60%, prox AVCFX 95%, RCA 95%, S-PDA occl, S-OM/Dx with Dx limb occl and prox 60%, L-LAD patent, EF 35-40% wtih ant-lat and apical HK-AK >> PCI:  BMS to native RCA   . Mixed hyperlipidemia 08/16/2013  . Myocardial infarction 03/2014 X 2?  [name] Type II diabetes mellitus   . DM type 2 causing complication 01/17/2955  . Arthritis     "back" (04/05/2014)  . Kidney stones   . Ischemic cardiomyopathy     a. EF 35-40% by LV gram 03/2014    Current Outpatient Prescriptions  Medication Sig Dispense Refill  . aspirin 81 MG tablet Take 81 mg by mouth daily.        [name]  clopidogrel (PLAVIX) 75 MG tablet Take 1 tablet (75 mg total) by mouth daily with breakfast.  30 tablet  11  . insulin glargine (LANTUS) 100 UNIT/ML injection Inject 38 Units into the skin every morning.       . metoprolol tartrate (LOPRESSOR) 25 MG tablet Take 1 tablet (25 mg total) by mouth 2 (two) times daily.  60 tablet  11  . nitroGLYCERIN (NITROSTAT) 0.4 MG SL tablet Place 1 tablet (0.4 mg total) under the tongue every 5 (five) minutes as needed for chest pain (CALL 911 IF NO RELIEF AFTER 20 minutes).  25 tablet  12  . ondansetron (ZOFRAN) 8 MG tablet Take 4 mg by mouth every 8 (eight) hours  as needed for nausea or vomiting.      . ONE TOUCH ULTRA TEST test strip       . pantoprazole (PROTONIX) 40 MG tablet Take 1 tablet (40 mg total) by mouth daily.  30 tablet  11  . simvastatin (ZOCOR) 40 MG tablet Take 1 tablet (40 mg total) by mouth at bedtime.  90 tablet  3  . losartan (COZAAR) 50 MG tablet Take 1 tablet (50 mg total) by mouth daily.  90 tablet  3   No current facility-administered medications for this visit.    Allergies:    Allergies  Allergen Reactions  . Codeine Nausea Only    Cannot take on empty stomach.  . Valium Nausea Only    Cannot take on empty stomach.    Social History:  The patient  reports that he has been smoking Cigars.  He has never used smokeless tobacco. He reports that he does not drink alcohol or use illicit drugs.   Family history:   Family History  Problem Relation Age of Onset  . Diabetes Mother   . Hypertension Mother   . Heart disease Mother     AAA  . Heart disease Sister   . Heart disease Son     Heart Disease before age 65    ROS:  Please see the history of present illness.  All other systems reviewed and negative.   PHYSICAL EXAM: VS:  BP 159/85  Pulse 79  Ht 5\' 9"  (1.753 m)  Wt 189 lb (85.73 kg)  BMI 27.90 kg/m2 Well nourished, well developed, in no acute distress HEENT: Pupils are equal round react to light accommodation extraocular movements are intact.  Neck: no JVDNo cervical lymphadenopathy. Cardiac: Regular rate and rhythm without murmurs rubs or gallops. Lungs:  clear to auscultation bilaterally, no wheezing, rhonchi or rales Abd: soft, nontender, positive bowel sounds all quadrants, no hepatosplenomegaly Ext: no lower extremity edema.  2+ radial and dorsalis pedis pulses. Skin: warm and dry Neuro:  Grossly normal  EKG:  Normal sinus rhythm rate 79 beats per minute. QTC 520 ms. Lateral T wave inversion which is new from prior EKG   ASSESSMENT AND PLAN:  Problem List Items Addressed This Visit   Atrial  fibrillation     Maintaining normal sinus rhythm with a rate of 79 beats per minute    Relevant Medications      losartan (COZAAR) tablet   Other Relevant Orders      Magnesium   CAD s/p CABG 2006     Although the patient feels tired and fatigued, he is not experiencing any angina. 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     Relevant Medications  losartan (COZAAR) tablet   Other Relevant Orders      EKG 12-Lead   Hypertension - Primary     Increase Cozaar to 50 mg daily    Relevant Medications      losartan (COZAAR) tablet   Other Relevant Orders      EKG 12-Lead   Ischemic cardiomyopathy     Patient appears euvolemic.  Discussed daily weight monitoring and low sodium diet.  Not currently on diuretic.    Relevant Medications      losartan (COZAAR) tablet   Mixed hyperlipidemia     On Zocor.      Relevant Medications      losartan (COZAAR) tablet   Prolonged Q-T interval on ECG     Checking Magnesium.  Not on any QT prolonging drugs    Relevant Medications      losartan (COZAAR) tablet

## 2014-04-30 NOTE — Assessment & Plan Note (Signed)
Increase Cozaar to 50 mg daily.

## 2014-05-01 LAB — MAGNESIUM: Magnesium: 1.7 mg/dL (ref 1.5–2.5)

## 2014-05-21 ENCOUNTER — Telehealth: Payer: Self-pay | Admitting: Cardiovascular Disease

## 2014-05-22 NOTE — Telephone Encounter (Signed)
Closed encounter °

## 2014-05-30 ENCOUNTER — Telehealth: Payer: Self-pay | Admitting: Pharmacist

## 2014-05-30 NOTE — Telephone Encounter (Signed)
Pharmacist did not have patient's dob to verify correct patient.

## 2014-06-20 ENCOUNTER — Encounter (HOSPITAL_COMMUNITY): Payer: Self-pay | Admitting: Cardiovascular Disease

## 2014-07-22 ENCOUNTER — Encounter: Payer: Self-pay | Admitting: Cardiovascular Disease

## 2014-07-22 ENCOUNTER — Ambulatory Visit (INDEPENDENT_AMBULATORY_CARE_PROVIDER_SITE_OTHER): Payer: 59 | Admitting: Cardiovascular Disease

## 2014-07-22 VITALS — BP 166/92 | HR 68 | Ht 69.0 in | Wt 187.6 lb

## 2014-07-22 DIAGNOSIS — I509 Heart failure, unspecified: Secondary | ICD-10-CM

## 2014-07-22 DIAGNOSIS — R0600 Dyspnea, unspecified: Secondary | ICD-10-CM | POA: Diagnosis not present

## 2014-07-22 DIAGNOSIS — E118 Type 2 diabetes mellitus with unspecified complications: Secondary | ICD-10-CM

## 2014-07-22 DIAGNOSIS — E782 Mixed hyperlipidemia: Secondary | ICD-10-CM

## 2014-07-22 DIAGNOSIS — I2510 Atherosclerotic heart disease of native coronary artery without angina pectoris: Secondary | ICD-10-CM

## 2014-07-22 DIAGNOSIS — I480 Paroxysmal atrial fibrillation: Secondary | ICD-10-CM | POA: Diagnosis not present

## 2014-07-22 DIAGNOSIS — Z79899 Other long term (current) drug therapy: Secondary | ICD-10-CM | POA: Diagnosis not present

## 2014-07-22 DIAGNOSIS — R5383 Other fatigue: Secondary | ICD-10-CM

## 2014-07-22 DIAGNOSIS — I255 Ischemic cardiomyopathy: Secondary | ICD-10-CM

## 2014-07-22 MED ORDER — CARVEDILOL 12.5 MG PO TABS: 12.5000 mg | ORAL_TABLET | Freq: Two times a day (BID) | ORAL | Status: DC

## 2014-07-22 NOTE — Patient Instructions (Signed)
STOP talking your Simvastatin and Metoprolol  START Carvedilol 12.5 mg Twice Daily  Your physician has requested that you have an echocardiogram. Echocardiography is a painless test that uses sound waves to create images of your heart. It provides your doctor with information about the size and shape of your heart and how well your heart's chambers and valves are working. This procedure takes approximately one hour. There are no restrictions for this procedure.  Your physician recommends that you return for lab work before your next appointment  Your physician recommends that you schedule a follow-up appointment in: 1 month with Dr.Croitoru    Please have your PLAVIX refilled ASAP.

## 2014-07-22 NOTE — Progress Notes (Signed)
Patient ID: [name], male   DOB: 25-Jun-1946, 69 y.o.   MRN: [id number]     Reason for office visit Follow-up after PCI/stent of native right coronary artery  [name] presented with unstable angina late last year and coronary angiography showed total occlusion of the SVG to the right coronary artery system as well as a 95% stenosis in the native right coronary artery. He underwent placement of a large caliber bare metal stent and has been free of angina since then. Coronary angiography also showed total occlusion of the LAD artery with a patent distal LIMA to LAD bypass and occlusion of the diagonal artery limb of a sequential saphenous vein graft bypass to the OM and diagonal. Left ventricular systolic function was moderately depressed but ejection fraction of 35-40%.  Although his angina has resolved completely since the procedure he complains of substantial problems with fatigue. He insists that it is more fatigue than shortness of breath. The only change in medications was addition of Plavix and increase dose of simvastatin to 40 mg daily and addition of losartan. His beta blocker dose was not changed. His blood pressure remains elevated.  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   Allergies  Allergen Reactions  . Codeine Nausea Only    Cannot take on empty stomach.  . Valium Nausea Only    Cannot take on empty stomach.    Current Outpatient Prescriptions  Medication Sig Dispense Refill  . aspirin 81 MG tablet Take 81 mg by mouth daily.      . clopidogrel (PLAVIX) 75 MG tablet Take 1 tablet (75 mg total) by mouth daily with breakfast. 30 tablet 11  . insulin glargine (LANTUS) 100 UNIT/ML injection Inject 38 Units into the skin every morning.     [name] losartan (COZAAR) 50 MG tablet Take 1 tablet (50 mg total) by mouth  daily. 90 tablet 3  . nitroGLYCERIN (NITROSTAT) 0.4 MG SL tablet Place 1 tablet (0.4 mg total) under the tongue every 5 (five) minutes as needed for chest pain (CALL 911 IF NO RELIEF AFTER 20 minutes). 25 tablet 12  . ondansetron (ZOFRAN) 8 MG tablet Take 4 mg by mouth every 8 (eight) hours as needed for nausea or vomiting.    . ONE TOUCH ULTRA TEST test strip     . pantoprazole (PROTONIX) 40 MG tablet Take 1 tablet (40 mg total) by mouth daily. 30 tablet 11  . carvedilol (COREG) 12.5 MG tablet Take 1 tablet (12.5 mg total) by mouth 2 (two) times daily. 180 tablet 3   No current facility-administered medications for this visit.    Past Medical History  Diagnosis Date  . Hypertension   . GERD (gastroesophageal reflux disease)   . Joint pain   . Dizziness   . CAD (coronary artery disease)   . Iliac artery aneurysm   . Lower back pain Jan 2015  . Atrial fibrillation   . CAD s/p CABG 2006 08/16/2013    a. Dr [name] - to LAD, SVG to diagonal, SVG to OM, SVG to PDA ;  b.  LHC (9/15): ostial LAD occl, ostial CFX 60%, prox AVCFX 95%, RCA 95%, S-PDA occl, S-OM/Dx with Dx limb occl and prox 60%, L-LAD patent, EF 35-40% wtih ant-lat and apical HK-AK >> PCI:  BMS to native RCA   . Mixed hyperlipidemia 08/16/2013  . Myocardial infarction 03/2014 X  2?  . Type II diabetes mellitus   . DM type 2 causing complication 08/16/9561  . Arthritis     "back" (04/05/2014)  . Kidney stones   . Ischemic cardiomyopathy     a. EF 35-40% by LV gram 03/2014    Past Surgical History  Procedure Laterality Date  . Coronary artery bypass graft  2006    LIMA to LAD,SVG to diagonal,SVG to obtuse marginal,SVG to posterior descending  . Iliac artery stent Left Aug. 10, 2011    Aorta to right ext iliac and left CIA stent repair   . Nm myocar perf wall motion  11/18/2009    low risk  . Tonsillectomy and adenoidectomy  1951  . Cystoscopy w/ stone manipulation  1970's X 2  . Cystoscopy w/ ureteroscopy w/ lithotripsy  ~ 2009   . Cardiac catheterization  2006    "before OHS"  . Coronary angioplasty with stent placement  04/05/2014    "1"  . Left heart catheterization with coronary/graft angiogram N/A 04/05/2014    Procedure: LEFT HEART CATHETERIZATION WITH [name];  Surgeon: Scott Sine, MD;  Location: [facility] CATH LAB;  Service: Cardiovascular;  Laterality: N/A;    Family History  Problem Relation Age of Onset  . Diabetes Mother   . Hypertension Mother   . Heart disease Mother     AAA  . Heart disease Sister   . Heart disease Son     Heart Disease before age 79    History   Social History  . Marital Status: Married    Spouse Name: N/A    Number of Children: N/A  . Years of Education: N/A   Occupational History  . Not on file.   Social History Main Topics  . Smoking status: Current Every Day Smoker -- 2 years    Types: Cigars  . Smokeless tobacco: Never Used  . Alcohol Use: No  . Drug Use: No  . Sexual Activity: Yes   Other Topics Concern  . Not on file   Social History Narrative    Review of systems: Marked fatigue and mild exertional dyspnea, angina pectoris has resolved. He denies syncope, palpitations, edema or focal neurological complaints.  No unexplained weight gain, cough, hemoptysis or wheezing.  The patient also denies abdominal pain, nausea, vomiting, dysphagia, diarrhea, constipation, polyuria, polydipsia, dysuria, hematuria, frequency, urgency, abnormal bleeding or bruising, fever, chills, unexpected weight changes, mood swings, change in skin or hair texture, change in voice quality, auditory or visual problems, allergic reactions or rashes, new musculoskeletal complaints other than usual "aches and pains".   PHYSICAL EXAM BP 166/92 mmHg  Pulse 68  Ht 5\' 9"  (1.753 m)  Wt 187 lb 9.6 oz (85.095 kg)  BMI 27.69 kg/m2 General: Alert, oriented x3, no distress  Head: no evidence of trauma, PERRL, EOMI, no exophtalmos or lid lag, no myxedema, no xanthelasma;  normal ears, nose and oropharynx  Neck: normal jugular venous pulsations and no hepatojugular reflux; brisk carotid pulses without delay and no carotid bruits  Chest: clear to auscultation, no signs of consolidation by percussion or palpation, normal fremitus, symmetrical and full respiratory excursions, sternotomy scar  Cardiovascular: normal position and quality of the apical impulse, regular rhythm, normal first and second heart sounds, no murmurs, rubs or gallops  Abdomen: no tenderness or distention, no masses by palpation, no abnormal pulsatility or arterial bruits, normal bowel sounds, no hepatosplenomegaly  Extremities: no clubbing, cyanosis or edema; 2+ radial, ulnar and brachial pulses bilaterally; 2+  right femoral, posterior tibial and dorsalis pedis pulses; 2+ left femoral, posterior tibial and dorsalis pedis pulses; no subclavian or femoral bruits  Neurological: grossly nonfocal  BMET    Component Value Date/Time   NA 138 04/06/2014 0358   K 3.8 04/06/2014 0358   CL 102 04/06/2014 0358   CO2 23 04/06/2014 0358   GLUCOSE 191* 04/06/2014 0358   BUN 11 04/06/2014 0358   CREATININE 0.97 04/06/2014 0358   CREATININE 1.03 04/03/2014 1648   CALCIUM 8.4 04/06/2014 0358   GFRNONAA 83* 04/06/2014 0358   GFRAA >90 04/06/2014 0358     ASSESSMENT AND PLAN  Jaidin's complaints are nonspecific and there are no clear hints on his physical exam as to their cause.  I wonder whether his fatigue may be a side effect from the increased dose of simvastatin and recommended a one-month statin holiday. If his fatigue improves off statin consider switching to a water soluble agent such as Crestor. Target LDL cholesterol less than 70 mg/deciliter.  There is no clear physical exam evidence to suggest hypervolemia but it is conceivable that his fatigue is an expression of congestive heart failure. Repeat echocardiogram to assess left ventricular ejection fraction following his revascularization  procedure and check BNP.  No reason to suspect a problem with this freshly placed stent at this time.  He is not bradycardic and his beta blocker dose has not been changed; cannot be therefore really incriminated for his fatigue. His blood pressure remains unacceptably high and rather than use metoprolol I think we'll switch to carvedilol for its more potent antihypertensive affect and improved benefit and ischemic cardiomyopathy.  Consider evaluation for obstructive sleep apnea, although he gives only limited symptoms suggestive for this disorder. No clear evidence to support hypothyroidism but may be worthwhile checking a TSH.   Orders Placed This Encounter  Procedures  . Basic Metabolic Panel (BMET)  . B Nat Peptide  . 2D Echocardiogram without contrast   Meds ordered this encounter  Medications  . carvedilol (COREG) 12.5 MG tablet    Sig: Take 1 tablet (12.5 mg total) by mouth 2 (two) times daily.    Dispense:  180 tablet    Refill:  [place] Ravina Milner, MD, [facility] [phone] office [phone] pager

## 2014-07-23 NOTE — Addendum Note (Signed)
Addended by: Janett Labella A on: 07/23/2014 08:13 AM   Modules accepted: Orders

## 2014-07-29 ENCOUNTER — Ambulatory Visit (HOSPITAL_COMMUNITY)
Admission: RE | Admit: 2014-07-29 | Discharge: 2014-07-29 | Disposition: A | Discharge status: Home / Self Care | Payer: Medicare Other | Source: Ambulatory Visit | Attending: Cardiology | Admitting: Cardiology

## 2014-07-29 DIAGNOSIS — I509 Heart failure, unspecified: Secondary | ICD-10-CM | POA: Insufficient documentation

## 2014-07-29 DIAGNOSIS — R0600 Dyspnea, unspecified: Secondary | ICD-10-CM | POA: Diagnosis not present

## 2014-07-29 DIAGNOSIS — I059 Rheumatic mitral valve disease, unspecified: Secondary | ICD-10-CM

## 2014-07-29 NOTE — Progress Notes (Signed)
2D Echocardiogram Complete.  07/29/2014   [name] [place], [place]

## 2014-07-31 DIAGNOSIS — E118 Type 2 diabetes mellitus with unspecified complications: Secondary | ICD-10-CM | POA: Diagnosis not present

## 2014-07-31 DIAGNOSIS — I5020 Unspecified systolic (congestive) heart failure: Secondary | ICD-10-CM | POA: Diagnosis not present

## 2014-08-06 DIAGNOSIS — E1165 Type 2 diabetes mellitus with hyperglycemia: Secondary | ICD-10-CM | POA: Diagnosis not present

## 2014-08-06 DIAGNOSIS — Z0001 Encounter for general adult medical examination with abnormal findings: Secondary | ICD-10-CM | POA: Diagnosis not present

## 2014-08-07 ENCOUNTER — Encounter: Payer: Self-pay | Admitting: Cardiovascular Disease

## 2014-08-12 ENCOUNTER — Encounter: Payer: Self-pay | Admitting: Family

## 2014-08-13 ENCOUNTER — Ambulatory Visit (HOSPITAL_COMMUNITY)
Admission: RE | Admit: 2014-08-13 | Discharge: 2014-08-13 | Disposition: A | Discharge status: Home / Self Care | Payer: Medicare Other | Source: Ambulatory Visit | Attending: Family | Admitting: Family

## 2014-08-13 ENCOUNTER — Encounter: Payer: Self-pay | Admitting: Family

## 2014-08-13 ENCOUNTER — Ambulatory Visit (INDEPENDENT_AMBULATORY_CARE_PROVIDER_SITE_OTHER): Payer: Medicare Other | Admitting: Family

## 2014-08-13 VITALS — BP 157/95 | HR 52 | Resp 14 | Ht 69.0 in | Wt 185.0 lb

## 2014-08-13 DIAGNOSIS — Z95828 Presence of other vascular implants and grafts: Secondary | ICD-10-CM | POA: Diagnosis not present

## 2014-08-13 DIAGNOSIS — I723 Aneurysm of iliac artery: Secondary | ICD-10-CM

## 2014-08-13 DIAGNOSIS — R1030 Lower abdominal pain, unspecified: Secondary | ICD-10-CM | POA: Insufficient documentation

## 2014-08-13 DIAGNOSIS — Z48812 Encounter for surgical aftercare following surgery on the circulatory system: Secondary | ICD-10-CM | POA: Diagnosis not present

## 2014-08-13 NOTE — Progress Notes (Signed)
[facility] [place]  Established Iliac artery Aneurysm  History of Present Illness  [name] is a 69 y.o. (1946/06/28) male pt of Dr. 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 This was performed in August of 2011. He had an aorto to right external iliac and left common iliac Gore stent graft placed. Has low back pain for years, the AAA was found incidental to evaluation of low back pain, not improved with AAA repair, states he was told that he has arthritis in spine, this pain is aggravated by strenuous work and relieved by Aleve.  States he has pain in both hips in the morning which is alleviated by walking.  Pt. denies claudication in legs, denies non healing wounds, denies history of stroke or TIA symptoms.  States he walks a great deal, does not use ETOH.  Pt Diabetic: Yes, review of records: A1C 07/31/14, was 7.1, fairly good control Pt smoker: smoker (occasional cigar)  Pt meds include: Statin :Yes Betablocker: Yes ASA: Yes Other anticoagulants/antiplatelets: Plavix  Past Medical History  Diagnosis Date  . Hypertension   . GERD (gastroesophageal reflux disease)   . Joint pain   . Dizziness   . CAD (coronary artery disease)   . Iliac artery aneurysm   . Lower back pain Jan 2015  . Atrial fibrillation   . CAD s/p CABG 2006 08/16/2013    a. Dr Darcey Nora - to LAD, SVG to diagonal, SVG to OM, SVG to PDA ;  b.  LHC (9/15): ostial LAD occl, ostial CFX 60%, prox AVCFX 95%, RCA 95%, S-PDA occl, S-OM/Dx with Dx limb occl and prox 60%, L-LAD patent, EF 35-40% wtih ant-lat and apical HK-AK >> PCI:  BMS to native RCA   . Mixed hyperlipidemia 08/16/2013  . Myocardial infarction 03/2014 X 2?  [name] Type II diabetes mellitus   . DM type 2 causing complication 11/14/8125  . Arthritis     "back" (04/05/2014)  . Kidney stones   . Ischemic cardiomyopathy     a. EF 35-40% by LV gram 03/2014    Past Surgical History  Procedure Laterality Date  . Coronary artery bypass graft  2006    LIMA to LAD,SVG to diagonal,SVG to obtuse marginal,SVG to posterior descending  . Iliac artery stent Left Aug. 10, 2011    Aorta to right ext iliac and left CIA stent repair   . Nm myocar perf wall motion  11/18/2009    low risk  . Tonsillectomy and adenoidectomy  1951  . Cystoscopy w/ stone manipulation  1970's X 2  . Cystoscopy w/ ureteroscopy w/ lithotripsy  ~ 2009  . Cardiac catheterization  2006    "before OHS"  . Coronary angioplasty with stent placement  04/05/2014    "1"  . Left heart catheterization with coronary/graft angiogram N/A 04/05/2014    Procedure: LEFT HEART CATHETERIZATION WITH [name];  Surgeon: [name], MD;  Location: [facility] CATH LAB;  Service: Cardiovascular;  Laterality: N/A;   Social History History   Social History  . Marital Status: Married    Spouse Name: N/A    Number of Children: N/A  . Years of Education: N/A   Occupational History  . Not on file.   Social History Main Topics  . Smoking status: Light Tobacco Smoker -- 2 years    Types: Cigars  . Smokeless tobacco: Never Used  . Alcohol Use: No  .  Drug Use: No  . Sexual Activity: Yes   Other Topics Concern  . Not on file   Social History Narrative   Family History Family History  Problem Relation Age of Onset  . Diabetes Mother   . Hypertension Mother   . Heart disease Mother     AAA  . Heart disease Sister   . Heart disease Son     Heart Disease before age 50    Current Outpatient Prescriptions on File Prior to Visit  Medication Sig Dispense Refill  . aspirin 81 MG tablet Take 81 mg by mouth daily.      . carvedilol (COREG) 12.5 MG tablet Take 1 tablet (12.5 mg total) by mouth 2 (two) times daily. 180 tablet 3  . clopidogrel (PLAVIX) 75 MG tablet Take 1 tablet (75 mg total) by mouth daily with breakfast. 30 tablet 11  . insulin glargine (LANTUS) 100 UNIT/ML injection  Inject 40 Units into the skin every morning.     [name] losartan (COZAAR) 50 MG tablet Take 1 tablet (50 mg total) by mouth daily. 90 tablet 3  . nitroGLYCERIN (NITROSTAT) 0.4 MG SL tablet Place 1 tablet (0.4 mg total) under the tongue every 5 (five) minutes as needed for chest pain (CALL 911 IF NO RELIEF AFTER 20 minutes). 25 tablet 12  . ONE TOUCH ULTRA TEST test strip     . pantoprazole (PROTONIX) 40 MG tablet Take 1 tablet (40 mg total) by mouth daily. 30 tablet 11  . ondansetron (ZOFRAN) 8 MG tablet Take 4 mg by mouth every 8 (eight) hours as needed for nausea or vomiting.     No current facility-administered medications on file prior to visit.   Allergies  Allergen Reactions  . Codeine Nausea Only    Cannot take on empty stomach.  . Valium Nausea Only    Cannot take on empty stomach.    ROS: See HPI for pertinent positives and negatives.  Physical Examination  Filed Vitals:   08/13/14 0916  BP: 157/95  Pulse: 52  Resp: 14  Height: 5\' 9"  (1.753 m)  Weight: 185 lb (83.915 kg)  SpO2: 98%   Body mass index is 27.31 kg/(m^2).  General: WDWN in NAD Gait: Normal HENT: WNL Eyes: Pupils equal Pulmonary: normal non-labored breathing , without Rales, rhonchi, wheezing Cardiac: RRR, without detected Murmurs.  Abdomen: soft, NT, no masses Skin: no rashes, ulcers noted; no Gangrene , no cellulitis; no open wounds;   VASCULAR EXAM  Carotid Bruits Left Right   Negative Negative     VASCULAR EXAM: Extremities without ischemic changes  without Gangrene; without open wounds.     LE Pulses LEFT RIGHT   FEMORAL  palpable  palpable    POPLITEAL not palpable  not palpable   POSTERIOR TIBIAL  palpable   palpable    DORSALIS PEDIS  ANTERIOR TIBIAL palpable   palpable     Musculoskeletal: no muscle wasting or atrophy; no peripheral edema Neurologic: A&O X 3; Appropriate Affect ;  SENSATION: normal; MOTOR FUNCTION: 5/5 Symmetric, CN 2-12 intact Speech is fluent/normal    Non-Invasive Vascular Imaging  AAA Duplex (08/13/2014) ABDOMINAL AORTA DUPLEX EVALUATION - POST ENDOVASCULAR REPAIR    INDICATION: Evaluation of endovascular abdominal repair of aortic aneurysm.    PREVIOUS INTERVENTION(S): Aorta to right external iliac and left common iliac artery stent repair of right common iliac artery and terminal abdominal aortic aneurysms on 02/18/2010    DUPLEX EXAM:      DIAMETER AP (cm)  DIAMETER TRANSVERSE (cm) VELOCITIES (cm/sec)  Aorta 2.59 2.67 97  Right Common Iliac 1.89  82  Left Common Iliac 1.13 1.13 89    Comparison Study       Date DIAMETER AP (cm) DIAMETER TRANSVERSE (cm)  07/31/2013 2.0 (Right CIA)      ADDITIONAL FINDINGS: Decreased visualization of the abdominal vasculature due to overlying bowel gas.    IMPRESSION: Patent stent of the abdominal aorta and iliac arteries with maximum diameter measurements described above.    Compared to the previous exam:  No significant change in comparison to the last exam on 07/31/2013.     Medical Decision Making  The patient is a 69 y.o. male who is s/p aorta to right external iliac and left common iliac artery stent repair of right common iliac artery and terminal abdominal aortic aneurysms on 02/18/2010.  The patient was counseled re smoking cessation and given several free resources re smoking cessation. Face to face time with patient was 25 minutes. Over 50% of this time was spent on counseling and coordination of care.  Today's EVAR Duplex reveals a patent stent of the abdominal aorta and iliac arteries with maximum diameter measurements described above. No significant change in comparison to the last exam on 07/31/2013.   Based on this patient's exam and diagnostic  studies, the patient will follow up in 1 year  with the following studies: EVAR Duplex.   I emphasized the importance of maximal medical management including strict control of blood pressure, blood glucose, and lipid levels, antiplatelet agents, obtaining regular exercise, and cessation of smoking.   The patient was given information about AAA including signs, symptoms, treatment, and how to minimize the risk of enlargement and rupture of aneurysms.    The patient was advised to call 911 should the patient experience sudden onset abdominal or back pain.   Thank you for allowing [place] to participate in this patient's care.  Clemon Chambers, RN, MSN, FNP-C [facility] [place] Office: [phone]  Clinic Physician: Kellie Simmering  08/13/2014, 9:16 AM

## 2014-08-13 NOTE — Addendum Note (Signed)
Addended by: Mena Goes on: 08/13/2014 10:38 AM   Modules accepted: Orders

## 2014-09-02 ENCOUNTER — Ambulatory Visit (INDEPENDENT_AMBULATORY_CARE_PROVIDER_SITE_OTHER): Payer: Medicare Other | Admitting: Cardiovascular Disease

## 2014-09-02 VITALS — BP 118/78 | HR 60 | Ht 69.0 in | Wt 187.2 lb

## 2014-09-02 DIAGNOSIS — I255 Ischemic cardiomyopathy: Secondary | ICD-10-CM

## 2014-09-02 DIAGNOSIS — E782 Mixed hyperlipidemia: Secondary | ICD-10-CM

## 2014-09-02 DIAGNOSIS — I10 Essential (primary) hypertension: Secondary | ICD-10-CM | POA: Diagnosis not present

## 2014-09-02 DIAGNOSIS — I480 Paroxysmal atrial fibrillation: Secondary | ICD-10-CM | POA: Diagnosis not present

## 2014-09-02 DIAGNOSIS — I2510 Atherosclerotic heart disease of native coronary artery without angina pectoris: Secondary | ICD-10-CM

## 2014-09-02 DIAGNOSIS — I723 Aneurysm of iliac artery: Secondary | ICD-10-CM

## 2014-09-02 DIAGNOSIS — R9431 Abnormal electrocardiogram [ECG] [EKG]: Secondary | ICD-10-CM

## 2014-09-02 DIAGNOSIS — I4581 Long QT syndrome: Secondary | ICD-10-CM | POA: Diagnosis not present

## 2014-09-02 NOTE — Patient Instructions (Signed)
Dr. Croitoru recommends that you schedule a follow-up appointment in: 6 MONTHS   

## 2014-09-03 ENCOUNTER — Encounter: Payer: Self-pay | Admitting: Cardiovascular Disease

## 2014-09-03 NOTE — Progress Notes (Signed)
Patient ID: [name], male   DOB: [birth year], 69 y.o.   MRN: [id number]     Reason for office visit CAD s/p CABG and PCI  [name] continues to complain of fatigue or to use his precise words "no energy". All his laboratory tests including his thyroid studies and hemoglobin are normal.  A follow-up echocardiogram showed normal left ventricular ejection fraction. Switching from metoprolol to carvedilol did not change his complaints at all.He does have much better blood pressure control now. He does not have angina or dyspnea. He insists that he does not snore and this is corroborated by [name]. He does not have the other typical symptoms of obstructive sleep apnea. He seems to lack any enjoyment in his usual activities and gives a few hints at symptoms of depression.  [name] presented with unstable angina late 2015 and coronary angiography showed total occlusion of the SVG to the right coronary artery system as well as a 95% stenosis in the native right coronary artery. He underwent placement of a large caliber bare metal stent and has been free of angina since then. Coronary angiography also showed total occlusion of the LAD artery with a patent distal LIMA to LAD bypass and occlusion of the diagonal artery limb of a sequential saphenous vein graft bypass to the OM and diagonal. Left ventricular systolic function was moderately depressed but ejection fraction of 35-40%.  Although his angina has resolved completely since the procedure he complains of substantial problems with fatigue. He insists that it is more fatigue than shortness of breath. The only change in medications was addition of Plavix and increase dose of simvastatin to 40 mg daily and addition of losartan. His beta blocker dose was not changed. His blood pressure remains elevated.  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 An ultrasound performed within the last month for the stent shows normal findings.    Allergies  Allergen Reactions  . Codeine Nausea Only    Cannot take on empty stomach.  . Valium Nausea Only    Cannot take on empty stomach.    Current Outpatient Prescriptions  Medication Sig Dispense Refill  . aspirin 81 MG tablet Take 81 mg by mouth daily.      . carvedilol (COREG) 12.5 MG tablet Take 1 tablet (12.5 mg total) by mouth 2 (two) times daily. 180 tablet 3  . clopidogrel (PLAVIX) 75 MG tablet Take 1 tablet (75 mg total) by mouth daily with breakfast. 30 tablet 11  . insulin glargine (LANTUS) 100 UNIT/ML injection Inject 40 Units into the skin every morning.     [name] losartan (COZAAR) 50 MG tablet Take 1 tablet (50 mg total) by mouth daily. 90 tablet 3  . nitroGLYCERIN (NITROSTAT) 0.4 MG SL tablet Place 1 tablet (0.4 mg total) under the tongue every 5 (five) minutes as needed for chest pain (CALL 911 IF NO RELIEF AFTER 20 minutes). 25 tablet 12  . ondansetron (ZOFRAN) 8 MG tablet Take 4 mg by mouth every 8 (eight) hours as needed for nausea or vomiting.    . ONE TOUCH ULTRA TEST test strip     . pantoprazole (PROTONIX) 40 MG tablet Take 1 tablet (40 mg total) by mouth daily. 30 tablet 11  . simvastatin (ZOCOR) 20 MG tablet Take 20 mg by mouth every evening.     No current facility-administered medications for this  visit.    Past Medical History  Diagnosis Date  . Hypertension   . GERD (gastroesophageal reflux disease)   . Joint pain   . Dizziness   . CAD (coronary artery disease)   . Iliac artery aneurysm   . Lower back pain Jan 2015  . Atrial fibrillation   . CAD s/p CABG 2006 08/16/2013    a. Dr Darcey Nora - to LAD, SVG to diagonal, SVG to OM, SVG to PDA ;  b.  LHC (9/15): ostial LAD occl, ostial CFX 60%, prox AVCFX 95%, RCA 95%, S-PDA occl, S-OM/Dx with Dx limb occl and prox 60%, L-LAD patent, EF 35-40% wtih ant-lat and apical  HK-AK >> PCI:  BMS to native RCA   . Mixed hyperlipidemia 08/16/2013  . Myocardial infarction 03/2014 X 2?  [name] Type II diabetes mellitus   . DM type 2 causing complication 01/10/2196  . Arthritis     "back" (04/05/2014)  . Kidney stones   . Ischemic cardiomyopathy     a. EF 35-40% by LV gram 03/2014    Past Surgical History  Procedure Laterality Date  . Coronary artery bypass graft  2006    LIMA to LAD,SVG to diagonal,SVG to obtuse marginal,SVG to posterior descending  . Iliac artery stent Left Aug. 10, 2011    Aorta to right ext iliac and left CIA stent repair   . Nm myocar perf wall motion  11/18/2009    low risk  . Tonsillectomy and adenoidectomy  1951  . Cystoscopy w/ stone manipulation  1970's X 2  . Cystoscopy w/ ureteroscopy w/ lithotripsy  ~ 2009  . Cardiac catheterization  2006    "before OHS"  . Coronary angioplasty with stent placement  04/05/2014    "1"  . Left heart catheterization with coronary/graft angiogram N/A 04/05/2014    Procedure: LEFT HEART CATHETERIZATION WITH [name];  Surgeon: [name], MD;  Location: [facility] CATH LAB;  Service: Cardiovascular;  Laterality: N/A;    Family History  Problem Relation Age of Onset  . Diabetes Mother   . Hypertension Mother   . Heart disease Mother     AAA  . Heart disease Sister   . Heart disease Son     Heart Disease before age 84    History   Social History  . Marital Status: Married    Spouse Name: N/A  . Number of Children: N/A  . Years of Education: N/A   Occupational History  . Not on file.   Social History Main Topics  . Smoking status: Light Tobacco Smoker -- 2 years    Types: Cigars  . Smokeless tobacco: Never Used  . Alcohol Use: No  . Drug Use: No  . Sexual Activity: Yes   Other Topics Concern  . Not on file   Social History Narrative    Review of systems: The patient specifically denies any chest pain at rest or with exertion, dyspnea at rest or with exertion, orthopnea,  paroxysmal nocturnal dyspnea, syncope, palpitations, focal neurological deficits, intermittent claudication, lower extremity edema, unexplained weight gain, cough, hemoptysis or wheezing.  The patient also denies abdominal pain, nausea, vomiting, dysphagia, diarrhea, constipation, polyuria, polydipsia, dysuria, hematuria, frequency, urgency, abnormal bleeding or bruising, fever, chills, unexpected weight changes, mood swings, change in skin or hair texture, change in voice quality, auditory or visual problems, allergic reactions or rashes, new musculoskeletal complaints other than usual "aches and pains".   PHYSICAL EXAM BP 118/78 mmHg  Pulse 60  Ht  5\' 9"  (1.753 m)  Wt 187 lb 3.2 oz (84.913 kg)  BMI 27.63 kg/m2 General: Alert, oriented x3, no distress  Head: no evidence of trauma, PERRL, EOMI, no exophtalmos or lid lag, no myxedema, no xanthelasma; normal ears, nose and oropharynx  Neck: normal jugular venous pulsations and no hepatojugular reflux; brisk carotid pulses without delay and no carotid bruits  Chest: clear to auscultation, no signs of consolidation by percussion or palpation, normal fremitus, symmetrical and full respiratory excursions, sternotomy scar  Cardiovascular: normal position and quality of the apical impulse, regular rhythm, normal first and second heart sounds, no murmurs, rubs or gallops  Abdomen: no tenderness or distention, no masses by palpation, no abnormal pulsatility or arterial bruits, normal bowel sounds, no hepatosplenomegaly  Extremities: no clubbing, cyanosis or edema; 2+ radial, ulnar and brachial pulses bilaterally; 2+ right femoral, posterior tibial and dorsalis pedis pulses; 2+ left femoral, posterior tibial and dorsalis pedis pulses; no subclavian or femoral bruits  Neurological: grossly nonfocal    BMET    Component Value Date/Time   NA 138 04/06/2014 0358   K 3.8 04/06/2014 0358   CL 102 04/06/2014 0358   CO2 23 04/06/2014 0358   GLUCOSE  191* 04/06/2014 0358   BUN 11 04/06/2014 0358   CREATININE 0.97 04/06/2014 0358   CREATININE 1.03 04/03/2014 1648   CALCIUM 8.4 04/06/2014 0358   GFRNONAA 83* 04/06/2014 0358   GFRAA >90 04/06/2014 0358     ASSESSMENT AND PLAN  Don't know why [name] has fatigue. I wonder whether he could have some depression although he does not give all the typical findings for this. Encouraged him to try more physical activity. No changes are planned to his cardiac regimen at this time. Suggested outpatient sleep study but he is not interested. Reviewed the importance of compliance with dual antiplatelet therapy for his fresh bare-metal stent. Urged efforts at a little bit of weight loss.  Patient Instructions  Dr. Sallyanne Kuster recommends that you schedule a follow-up appointment in: [place] Lakena Sparlin, MD, [facility] [phone] office [phone] pager

## 2014-11-08 ENCOUNTER — Other Ambulatory Visit: Payer: Self-pay

## 2014-11-08 MED ORDER — CLOPIDOGREL BISULFATE 75 MG PO TABS: 75.0000 mg | ORAL_TABLET | Freq: Every day | ORAL | Status: DC

## 2014-11-08 MED ORDER — PANTOPRAZOLE SODIUM 40 MG PO TBEC: 40.0000 mg | DELAYED_RELEASE_TABLET | Freq: Every day | ORAL | Status: DC

## 2014-11-08 MED ORDER — LOSARTAN POTASSIUM 50 MG PO TABS: 50.0000 mg | ORAL_TABLET | Freq: Every day | ORAL | Status: DC

## 2014-11-08 MED ORDER — CARVEDILOL 12.5 MG PO TABS: 12.5000 mg | ORAL_TABLET | Freq: Two times a day (BID) | ORAL | Status: DC

## 2014-11-08 NOTE — Telephone Encounter (Signed)
Rx(s) sent to pharmacy electronically.  

## 2014-12-26 DIAGNOSIS — L720 Epidermal cyst: Secondary | ICD-10-CM | POA: Diagnosis not present

## 2014-12-26 DIAGNOSIS — L02212 Cutaneous abscess of back [any part, except buttock]: Secondary | ICD-10-CM | POA: Diagnosis not present

## 2015-02-17 DIAGNOSIS — Z0001 Encounter for general adult medical examination with abnormal findings: Secondary | ICD-10-CM | POA: Diagnosis not present

## 2015-02-17 DIAGNOSIS — E1165 Type 2 diabetes mellitus with hyperglycemia: Secondary | ICD-10-CM | POA: Diagnosis not present

## 2015-02-24 DIAGNOSIS — E1122 Type 2 diabetes mellitus with diabetic chronic kidney disease: Secondary | ICD-10-CM | POA: Diagnosis not present

## 2015-02-24 DIAGNOSIS — F3340 Major depressive disorder, recurrent, in remission, unspecified: Secondary | ICD-10-CM | POA: Diagnosis not present

## 2015-02-24 DIAGNOSIS — E785 Hyperlipidemia, unspecified: Secondary | ICD-10-CM | POA: Diagnosis not present

## 2015-02-24 DIAGNOSIS — I130 Hypertensive heart and chronic kidney disease with heart failure and stage 1 through stage 4 chronic kidney disease, or unspecified chronic kidney disease: Secondary | ICD-10-CM | POA: Diagnosis not present

## 2015-02-24 DIAGNOSIS — F17291 Nicotine dependence, other tobacco product, in remission: Secondary | ICD-10-CM | POA: Diagnosis not present

## 2015-04-04 DIAGNOSIS — Z23 Encounter for immunization: Secondary | ICD-10-CM | POA: Diagnosis not present

## 2015-05-05 ENCOUNTER — Encounter: Payer: Self-pay | Admitting: Cardiovascular Disease

## 2015-05-05 ENCOUNTER — Ambulatory Visit (INDEPENDENT_AMBULATORY_CARE_PROVIDER_SITE_OTHER): Payer: Medicare Other | Admitting: Cardiovascular Disease

## 2015-05-05 VITALS — BP 154/86 | HR 56 | Ht 69.0 in | Wt 186.0 lb

## 2015-05-05 DIAGNOSIS — I255 Ischemic cardiomyopathy: Secondary | ICD-10-CM | POA: Diagnosis not present

## 2015-05-05 DIAGNOSIS — I2510 Atherosclerotic heart disease of native coronary artery without angina pectoris: Secondary | ICD-10-CM

## 2015-05-05 DIAGNOSIS — Z79899 Other long term (current) drug therapy: Secondary | ICD-10-CM | POA: Diagnosis not present

## 2015-05-05 DIAGNOSIS — I10 Essential (primary) hypertension: Secondary | ICD-10-CM

## 2015-05-05 DIAGNOSIS — I5042 Chronic combined systolic (congestive) and diastolic (congestive) heart failure: Secondary | ICD-10-CM

## 2015-05-05 DIAGNOSIS — E785 Hyperlipidemia, unspecified: Secondary | ICD-10-CM

## 2015-05-05 DIAGNOSIS — I480 Paroxysmal atrial fibrillation: Secondary | ICD-10-CM

## 2015-05-05 MED ORDER — ROSUVASTATIN CALCIUM 10 MG PO TABS: 10.0000 mg | ORAL_TABLET | Freq: Every day | ORAL | Status: DC

## 2015-05-05 MED ORDER — CARVEDILOL 6.25 MG PO TABS: 6.2500 mg | ORAL_TABLET | Freq: Two times a day (BID) | ORAL | Status: DC

## 2015-05-05 MED ORDER — LOSARTAN POTASSIUM 50 MG PO TABS: 50.0000 mg | ORAL_TABLET | Freq: Two times a day (BID) | ORAL | Status: DC

## 2015-05-05 NOTE — Patient Instructions (Signed)
Your physician has recommended you make the following change in your medication:   STOP CLOPIDOGREL (PLAVIX) DECREASE CARVEDILOL TO 6.25MG  TWICE A DAY INCREASE LOSARTAN TO 50MG  TWICE A DAY STOP SIMVASTATIN START CRESTOR 10MG  DAILY  Your physician recommends that you return for lab work in: FASTING IN 3 MONTHS AT [facility]  Dr. Sallyanne Kuster recommends that you schedule a follow-up appointment in: 4 MONTHS.

## 2015-05-05 NOTE — Progress Notes (Signed)
Patient ID: [name], male   DOB: July 11, 1946, 69 y.o.   MRN: [id number]     Cardiology Office Note   Date:  05/06/2015   ID:  Scott, Steele Jul 11, 1946, MRN [id number]  PCP:  Thressa Sheller, MD  Cardiologist:   Sanda Klein, MD   Chief Complaint  Patient presents with  . 6 MONTHS      History of Present Illness: [name] is a 69 y.o. 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  In September 2015, he presented with unstable angina and coronary angiography showed total occlusion of the SVG to the right coronary artery system as well as a 95% stenosis in the native right coronary artery. He underwent placement of a large caliber bare metal stent and has been free of angina since then. Coronary angiography also showed total occlusion of the LAD artery with a patent distal LIMA to LAD bypass and occlusion of the diagonal artery limb of a sequential saphenous vein graft bypass to the OM and diagonal. Left ventricular systolic function was moderately depressed, ejection fraction of 35-40%.  As before, Scott complains primarily of fatigue. His electrocardiogram shows sinus bradycardia. He denies angina pectoris or exertional dyspnea. Risk factors are generally favorably controlled with a hemoglobin A1c of 6.5% and an LDL of 85 (he cannot tolerate higher doses of simvastatin or other statins). His blood pressure has been slightly high. He does not smoke cigarettes and walks slowly, but on a daily basis.    Past Medical History  Diagnosis Date  . Hypertension   . GERD (gastroesophageal reflux disease)   . Joint  pain   . Dizziness   . CAD (coronary artery disease)   . Iliac artery aneurysm ([place])   . Lower back pain Jan 2015  . Atrial fibrillation ([place])   . CAD s/p CABG 2006 08/16/2013    a. Dr Darcey Nora - to LAD, SVG to diagonal, SVG to OM, SVG to PDA ;  b.  LHC (9/15): ostial LAD occl, ostial CFX 60%, prox AVCFX 95%, RCA 95%, S-PDA occl, S-OM/Dx with Dx limb occl and prox 60%, L-LAD patent, EF 35-40% wtih ant-lat and apical HK-AK >> PCI:  BMS to native RCA   . Mixed hyperlipidemia 08/16/2013  . Myocardial infarction (Scott) 03/2014 X 2?  Marland Steele Type II diabetes mellitus (Scott)   . DM type 2 causing complication ([place]) 10/12/8766  . Arthritis     "back" (04/05/2014)  . Kidney stones   . Ischemic cardiomyopathy     a. EF 35-40% by LV gram 03/2014    Past Surgical History  Procedure Laterality Date  . Coronary artery bypass graft  2006    LIMA to LAD,SVG to diagonal,SVG to obtuse marginal,SVG to posterior descending  . Iliac artery stent Left Aug. 10, 2011    Aorta to right ext iliac and left CIA stent repair   . Nm myocar perf wall motion  11/18/2009    low risk  . Tonsillectomy and adenoidectomy  1951  . Cystoscopy w/ stone manipulation  1970's X 2  . Cystoscopy w/ ureteroscopy w/ lithotripsy  ~ 2009  . Cardiac catheterization  2006    "before OHS"  . Coronary  angioplasty with stent placement  04/05/2014    "1"  . Left heart catheterization with coronary/graft angiogram N/A 04/05/2014    Procedure: LEFT HEART CATHETERIZATION WITH [name];  Surgeon: [name], MD;  Location: [facility] CATH LAB;  Service: Cardiovascular;  Laterality: N/A;     Current Outpatient Prescriptions  Medication Sig Dispense Refill  . aspirin 81 MG tablet Take 81 mg by mouth daily.      . carvedilol (COREG) 6.25 MG tablet Take 1 tablet (6.25 mg total) by mouth 2 (two) times daily. 180 tablet 3  . insulin glargine (LANTUS) 100 UNIT/ML injection Inject 40 Units into the skin every morning.     Marland Steele losartan (COZAAR) 50  MG tablet Take 1 tablet (50 mg total) by mouth 2 (two) times daily. 180 tablet 3  . nitroGLYCERIN (NITROSTAT) 0.4 MG SL tablet Place 1 tablet (0.4 mg total) under the tongue every 5 (five) minutes as needed for chest pain (CALL 911 IF NO RELIEF AFTER 20 minutes). 25 tablet 12  . ondansetron (ZOFRAN) 8 MG tablet Take 4 mg by mouth every 8 (eight) hours as needed for nausea or vomiting.    . ONE TOUCH ULTRA TEST test strip     . pantoprazole (PROTONIX) 40 MG tablet Take 1 tablet (40 mg total) by mouth daily. 90 tablet 3  . rosuvastatin (CRESTOR) 10 MG tablet Take 1 tablet (10 mg total) by mouth daily. 90 tablet 3   No current facility-administered medications for this visit.    Allergies:   Codeine and Valium    Social History:  The patient  reports that he has been smoking Cigars.  He has never used smokeless tobacco. He reports that he does not drink alcohol or use illicit drugs.   Family History:  The patient's family history includes Diabetes in his mother; Heart disease in his mother, sister, and son; Hypertension in his mother.    ROS:  Please see the history of present illness.    Otherwise, review of systems positive for none.   All other systems are reviewed and negative.    PHYSICAL EXAM: VS:  BP 154/86 mmHg  Pulse 56  Ht 5\' 9"  (1.753 m)  Wt 186 lb (84.369 kg)  BMI 27.45 kg/m2 , BMI Body mass index is 27.45 kg/(m^2).  General: Alert, oriented x3, no distress Head: no evidence of trauma, PERRL, EOMI, no exophtalmos or lid lag, no myxedema, no xanthelasma; normal ears, nose and oropharynx Neck: normal jugular venous pulsations and no hepatojugular reflux; brisk carotid pulses without delay and no carotid bruits Chest: clear to auscultation, no signs of consolidation by percussion or palpation, normal fremitus, symmetrical and full respiratory excursions Cardiovascular: normal position and quality of the apical impulse, regular rhythm, normal first and second heart sounds, no  murmurs, rubs or gallops Abdomen: no tenderness or distention, no masses by palpation, no abnormal pulsatility or arterial bruits, normal bowel sounds, no hepatosplenomegaly Extremities: no clubbing, cyanosis or edema; 2+ radial, ulnar and brachial pulses bilaterally; 2+ right femoral, posterior tibial and dorsalis pedis pulses; 2+ left femoral, posterior tibial and dorsalis pedis pulses; no subclavian or femoral bruits Neurological: grossly nonfocal Psych: euthymic mood, full affect   EKG:  EKG is ordered today. The ekg ordered today demonstrates sinus bradycardia, minor intraventricular conduction delay, QTC 482 seconds, no ischemic repolarization abnormalities   Recent Labs: 02/17/2015, Dr. Noah Delaine TSH 4.66, hemoglobin A1c 6.5, hemoglobin 14.8, normal electrolytes and LFTs, creatinine 1.07   Lipid Panel Total cholesterol  145, HDL 26, LDL 85, triglycerides 171   Wt Readings from Last 3 Encounters:  05/05/15 186 lb (84.369 kg)  09/02/14 187 lb 3.2 oz (84.913 kg)  08/13/14 185 lb (83.915 kg)      ASSESSMENT AND PLAN:  1. CAD s/p CABG 2006 and stent RCA Sep 2015 - angina free, risk factors generally well addressed. He is still concerned that clopidogrel is one of the reasons for his fatigue. He does not have anemia. Over 12 months passed since his last revascularization procedure. At his request we'll discontinue the clopidogrel, although he is at fairly high risk for future peripheral and coronary artery events and might benefit from long-term dual antiplatelet therapy. 2. Mixed dyslipidemia with very low HDL cholesterol, slightly elevated triglycerides and modestly elevated LDL cholesterol. Unfortunately he cannot tolerate higher doses of statin. He is not interested in injectable medications. He does not think he'll be able to afford them. We'll try to switch from simvastatin to rosuvastatin 10 mg daily and reevaluate. 3. Diabetes mellitus, type 2 insulin-requiring: control is  satisfactory. 4. Essential hypertension, poorly controlled increase losartan to 50 mg twice a day 5. Sinus bradycardia. Fatigue, not improved on lower statin dose. We'll try to decrease the dose of carvedilol to 6.25 mg twice a day. 6. PAD status post EVAR for iliac artery aneurysm    Current medicines are reviewed at length with the patient today.  The patient does not have concerns regarding medicines.    Labs/ tests ordered today include:  Orders Placed This Encounter  Procedures  . Lipid panel  . Comprehensive metabolic panel  . EKG 12-Lead     Patient Instructions  Your physician has recommended you make the following change in your medication:   STOP CLOPIDOGREL (PLAVIX) DECREASE CARVEDILOL TO 6.25MG  TWICE A DAY INCREASE LOSARTAN TO 50MG  TWICE A DAY STOP SIMVASTATIN START CRESTOR 10MG  DAILY  Your physician recommends that you return for lab work in: FASTING IN 3 MONTHS AT [facility]  Dr. Sallyanne Kuster recommends that you schedule a follow-up appointment in: 4 MONTHS.         Mikael Spray, MD  05/06/2015 2:05 PM    Sanda Klein, MD, [facility] [phone] office [phone] pager

## 2015-05-06 DIAGNOSIS — I5042 Chronic combined systolic (congestive) and diastolic (congestive) heart failure: Secondary | ICD-10-CM | POA: Insufficient documentation

## 2015-05-15 ENCOUNTER — Encounter: Payer: Self-pay | Admitting: Cardiovascular Disease

## 2015-08-04 DIAGNOSIS — Z1212 Encounter for screening for malignant neoplasm of rectum: Secondary | ICD-10-CM | POA: Diagnosis not present

## 2015-08-04 DIAGNOSIS — Z1211 Encounter for screening for malignant neoplasm of colon: Secondary | ICD-10-CM | POA: Diagnosis not present

## 2015-08-13 ENCOUNTER — Other Ambulatory Visit: Payer: Self-pay | Admitting: Cardiovascular Disease

## 2015-08-14 NOTE — Telephone Encounter (Signed)
Pt no longer taking me stopped at last appt 03/2015

## 2015-08-19 ENCOUNTER — Other Ambulatory Visit (HOSPITAL_COMMUNITY): Payer: Medicare Other

## 2015-08-19 ENCOUNTER — Ambulatory Visit: Payer: Medicare Other | Admitting: Family

## 2015-08-20 DIAGNOSIS — Z125 Encounter for screening for malignant neoplasm of prostate: Secondary | ICD-10-CM | POA: Diagnosis not present

## 2015-08-20 DIAGNOSIS — I509 Heart failure, unspecified: Secondary | ICD-10-CM | POA: Diagnosis not present

## 2015-08-20 DIAGNOSIS — Z0000 Encounter for general adult medical examination without abnormal findings: Secondary | ICD-10-CM | POA: Diagnosis not present

## 2015-08-20 DIAGNOSIS — E1122 Type 2 diabetes mellitus with diabetic chronic kidney disease: Secondary | ICD-10-CM | POA: Diagnosis not present

## 2015-08-21 LAB — PSA: PSA: 1.9

## 2015-08-28 DIAGNOSIS — E1122 Type 2 diabetes mellitus with diabetic chronic kidney disease: Secondary | ICD-10-CM | POA: Diagnosis not present

## 2015-08-28 DIAGNOSIS — I714 Abdominal aortic aneurysm, without rupture: Secondary | ICD-10-CM | POA: Diagnosis not present

## 2015-08-28 DIAGNOSIS — I129 Hypertensive chronic kidney disease with stage 1 through stage 4 chronic kidney disease, or unspecified chronic kidney disease: Secondary | ICD-10-CM | POA: Diagnosis not present

## 2015-08-29 ENCOUNTER — Encounter: Payer: Self-pay | Admitting: Family

## 2015-09-03 ENCOUNTER — Encounter: Payer: Self-pay | Admitting: Cardiovascular Disease

## 2015-09-03 ENCOUNTER — Ambulatory Visit (INDEPENDENT_AMBULATORY_CARE_PROVIDER_SITE_OTHER): Payer: Medicare Other | Admitting: Cardiovascular Disease

## 2015-09-03 VITALS — BP 124/68 | HR 60 | Ht 69.0 in | Wt 188.0 lb

## 2015-09-03 DIAGNOSIS — I255 Ischemic cardiomyopathy: Secondary | ICD-10-CM

## 2015-09-03 DIAGNOSIS — I2510 Atherosclerotic heart disease of native coronary artery without angina pectoris: Secondary | ICD-10-CM

## 2015-09-03 DIAGNOSIS — E782 Mixed hyperlipidemia: Secondary | ICD-10-CM

## 2015-09-03 DIAGNOSIS — I5042 Chronic combined systolic (congestive) and diastolic (congestive) heart failure: Secondary | ICD-10-CM

## 2015-09-03 DIAGNOSIS — I723 Aneurysm of iliac artery: Secondary | ICD-10-CM | POA: Diagnosis not present

## 2015-09-03 MED ORDER — CRESTOR 10 MG PO TABS: 10.0000 mg | ORAL_TABLET | Freq: Every day | ORAL | Status: DC

## 2015-09-03 NOTE — Progress Notes (Signed)
Patient ID: [name], male   DOB: 04-Feb-1946, 70 y.o.   MRN: [id number]    Cardiology Office Note    Date:  09/03/2015   ID:  [name] 28-Jul-1945, MRN [id number]  PCP:  Thressa Sheller, MD  Cardiologist:   Sanda Klein, MD   Chief Complaint  Patient presents with  . 4 months    Patient has no complaints.    History of Present Illness:  [name] is a 70 y.o. male with a long-standing history of coronary artery disease who returns for follow-up. He is asymptomatic. He is able to split wood and carried into the wood shed without any shortness of breath or chest discomfort. He does most of the housework since his wife Murray Hodgkins has back problems. After reducing his dose of beta blocker he has more energy and is no longer bradycardic. He complains about the very high cost of generic rosuvastatin  He underwent four-vessel bypass surgery in 2006 (LIMA-LAD, SVG x 3 to Diagonal, OM and PDA). A nuclear stress test in 2011 was normal. He has normal left ventricular systolic function. He has received a stent in the large right common iliac artery aneurysm and is monitored by Dr. Kellie Simmering. He has insulin requiring diabetes mellitus. He has chronically low HDL cholesterol , around 27 mg/dL. Cardiac cath on 04/05/14 and demonstrated worsening LVF with EF 35-40% and anterolateral and apical hypo-akinesis, S-PDA occluded, S-Dx/OM with prox 60% and dist limb occluded (to diagonal), native AVCFX with 95%, L-LAD patent. Native RCA with prox 95% stenosis treated with BMS. Med Rx recommended for residual disease.   Follow-up echocardiogram January 2016 shows normal left ventricular systolic function with ejection fraction of 55-60 percent and a moderately dilated left atrium.  Labs earlier this month showed a BNP that was essentially normal at 108, LDL cholesterol 72, triglycerides 194, total cholesterol 139, creatinine 1.05 hemoglobin A1c 7.1%  Past Medical History  Diagnosis Date  . Hypertension     . GERD (gastroesophageal reflux disease)   . Joint pain   . Dizziness   . CAD (coronary artery disease)   . Iliac artery aneurysm ([place])   . Lower back pain Jan 2015  . Atrial fibrillation ([place])   . CAD s/p CABG 2006 08/16/2013    a. Dr Darcey Nora - to LAD, SVG to diagonal, SVG to OM, SVG to PDA ;  b.  LHC (9/15): ostial LAD occl, ostial CFX 60%, prox AVCFX 95%, RCA 95%, S-PDA occl, S-OM/Dx with Dx limb occl and prox 60%, L-LAD patent, EF 35-40% wtih ant-lat and apical HK-AK >> PCI:  BMS to native RCA   . Mixed hyperlipidemia 08/16/2013  . Myocardial infarction ([place]) 03/2014 X 2?  [name] Type II diabetes mellitus ([place])   . DM type 2 causing complication ([place]) 123456  . Arthritis     "back" (04/05/2014)  . Kidney stones   . Ischemic cardiomyopathy     a. EF 35-40% by LV gram 03/2014    Past Surgical History  Procedure Laterality Date  . Coronary artery bypass graft  2006    LIMA to LAD,SVG to diagonal,SVG to obtuse marginal,SVG to posterior descending  . Iliac artery stent Left Aug. 10, 2011    Aorta to right ext iliac and left CIA stent repair   . Nm myocar perf wall motion  11/18/2009    low risk  . Tonsillectomy and adenoidectomy  1951  . Cystoscopy w/ stone manipulation  1970's X 2  . Cystoscopy w/  ureteroscopy w/ lithotripsy  ~ 2009  . Cardiac catheterization  2006    "before OHS"  . Coronary angioplasty with stent placement  04/05/2014    "1"  . Left heart catheterization with coronary/graft angiogram N/A 04/05/2014    Procedure: LEFT HEART CATHETERIZATION WITH [name];  Surgeon: [name], MD;  Location[facility] CATH LAB;  Service: Cardiovascular;  Laterality: N/A;    Outpatient Prescriptions Prior to Visit  Medication Sig Dispense Refill  . aspirin 81 MG tablet Take 81 mg by mouth daily.      . carvedilol (COREG) 6.25 MG tablet Take 1 tablet (6.25 mg total) by mouth 2 (two) times daily. 180 tablet 3  . insulin glargine (LANTUS) 100 UNIT/ML injection Inject 40  Units into the skin every morning.     [name] losartan (COZAAR) 50 MG tablet Take 1 tablet (50 mg total) by mouth 2 (two) times daily. 180 tablet 3  . nitroGLYCERIN (NITROSTAT) 0.4 MG SL tablet Place 1 tablet (0.4 mg total) under the tongue every 5 (five) minutes as needed for chest pain (CALL 911 IF NO RELIEF AFTER 20 minutes). 25 tablet 12  . ondansetron (ZOFRAN) 8 MG tablet Take 4 mg by mouth every 8 (eight) hours as needed for nausea or vomiting.    . ONE TOUCH ULTRA TEST test strip     . pantoprazole (PROTONIX) 40 MG tablet Take 1 tablet (40 mg total) by mouth daily. 90 tablet 3  . rosuvastatin (CRESTOR) 10 MG tablet Take 1 tablet (10 mg total) by mouth daily. 90 tablet 3   No facility-administered medications prior to visit.     Allergies:   Codeine and Valium   Social History   Social History  . Marital Status: Married    Spouse Name: N/A  . Number of Children: N/A  . Years of Education: N/A   Social History Main Topics  . Smoking status: Light Tobacco Smoker -- 2 years    Types: Cigars  . Smokeless tobacco: Never Used  . Alcohol Use: No  . Drug Use: No  . Sexual Activity: Yes   Other Topics Concern  . None   Social History Narrative     Family History:  The patient's family history includes Diabetes in his mother; Heart disease in his mother, sister, and son; Hypertension in his mother.   ROS:   Please see the history of present illness.    ROS All other systems reviewed and are negative.   PHYSICAL EXAM:   VS:  BP 124/68 mmHg  Pulse 60  Ht 5\' 9"  (1.753 m)  Wt 85.276 kg (188 lb)  BMI 27.75 kg/m2   GEN: Well nourished, well developed, in no acute distress HEENT: normal Neck: no JVD, carotid bruits, or masses Cardiac: RRR; no murmurs, rubs, or gallops,no edema  Respiratory:  clear to auscultation bilaterally, normal work of breathing GI: soft, nontender, nondistended, + BS MS: no deformity or atrophy Skin: warm and dry, no rash Neuro:  Alert and Oriented x 3,  Strength and sensation are intact Psych: euthymic mood, full affect  Wt Readings from Last 3 Encounters:  09/03/15 85.276 kg (188 lb)  05/05/15 84.369 kg (186 lb)  09/02/14 84.913 kg (187 lb 3.2 oz)      Studies/Labs Reviewed:   EKG:  EKG is not ordered today.  Recent Labs: Labs earlier this month showed a BNP that was essentially normal at 108, LDL cholesterol 72, triglycerides 194, total cholesterol 139, creatinine 1.05 hemoglobin A1c  7.1%  ASSESSMENT:    1. Chronic combined systolic and diastolic heart failure ([place])   2. Ischemic cardiomyopathy   3. Coronary artery disease involving native coronary artery of native heart without angina pectoris   4. 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[facility])   5. Mixed hyperlipidemia      PLAN:  In order of problems listed above:  1. CHF: Left ventricular systolic function has returned to normal as of his most recent echocardiogram. NYHA functional class I. Clinically euvolemic, normal BNP. No changes made to his medications today. 2. CMP: Improved following his most recent revascularization procedure, now without regional wall motion abnormalities and with normal left ventricular ejection fraction 3. CAD: Asymptomatic, free of angina. About 18 months have passed since his last stent procedure and hopefully he is beyond the window for restenosis. Fatigue has improved on a lower dose of carvedilol and he still has well-controlled symptoms and normal blood pressure 4. PAD: History of stent graft for aortic aneurysm, being followed in the vascular surgery clinic with an appointment scheduled in a couple of days for repeat ultrasound 5. HLP: The lipid profile parameters are virtually all in the desirable range with the exception of the low HDL cholesterol. Encouraged to be physically active, walk every day. Paradoxically, brand-name Crestor may be cheaper than generic rosuvastatin. We'll try this. If it is still unaffordable will switch to simvastatin 40 mg once  daily in the evenings.    Medication Adjustments/Labs and Tests Ordered: Current medicines are reviewed at length with the patient today.  Concerns regarding medicines are outlined above.  Medication changes, Labs and Tests ordered today are listed in the Patient Instructions below. Patient Instructions  A new Rx for Crestor brand name has been sent to your pharmacy.  Dr. Sallyanne Kuster recommends that you schedule a follow-up appointment in: one year        Signed, Sanda Klein, MD  09/03/2015 3:14 PM    [address], [place], [place]  [address] Phone: [phone]; Fax: [phone]

## 2015-09-03 NOTE — Patient Instructions (Signed)
A new Rx for Crestor brand name has been sent to your pharmacy.  Dr. Sallyanne Kuster recommends that you schedule a follow-up appointment in: one year

## 2015-09-04 ENCOUNTER — Other Ambulatory Visit (HOSPITAL_COMMUNITY): Payer: Medicare Other

## 2015-09-04 ENCOUNTER — Ambulatory Visit: Payer: Medicare Other | Admitting: Family

## 2015-09-05 ENCOUNTER — Encounter: Payer: Self-pay | Admitting: Family

## 2015-09-05 ENCOUNTER — Ambulatory Visit (INDEPENDENT_AMBULATORY_CARE_PROVIDER_SITE_OTHER): Payer: Medicare Other | Admitting: Family

## 2015-09-05 ENCOUNTER — Ambulatory Visit (HOSPITAL_COMMUNITY)
Admission: RE | Admit: 2015-09-05 | Discharge: 2015-09-05 | Disposition: A | Discharge status: Home / Self Care | Payer: Medicare Other | Source: Ambulatory Visit | Attending: Family | Admitting: Family

## 2015-09-05 VITALS — BP 148/87 | HR 51 | Temp 96.9°F | Resp 14 | Ht 69.0 in | Wt 182.0 lb

## 2015-09-05 DIAGNOSIS — I429 Cardiomyopathy, unspecified: Secondary | ICD-10-CM | POA: Insufficient documentation

## 2015-09-05 DIAGNOSIS — Z48812 Encounter for surgical aftercare following surgery on the circulatory system: Secondary | ICD-10-CM

## 2015-09-05 DIAGNOSIS — I723 Aneurysm of iliac artery: Secondary | ICD-10-CM | POA: Insufficient documentation

## 2015-09-05 DIAGNOSIS — E119 Type 2 diabetes mellitus without complications: Secondary | ICD-10-CM | POA: Insufficient documentation

## 2015-09-05 DIAGNOSIS — E782 Mixed hyperlipidemia: Secondary | ICD-10-CM | POA: Insufficient documentation

## 2015-09-05 DIAGNOSIS — Z4889 Encounter for other specified surgical aftercare: Secondary | ICD-10-CM | POA: Diagnosis not present

## 2015-09-05 DIAGNOSIS — Z95828 Presence of other vascular implants and grafts: Secondary | ICD-10-CM

## 2015-09-05 DIAGNOSIS — I119 Hypertensive heart disease without heart failure: Secondary | ICD-10-CM | POA: Insufficient documentation

## 2015-09-05 NOTE — Progress Notes (Signed)
Filed Vitals:   09/05/15 0917 09/05/15 0926  BP: 182/81 148/87  Pulse: 53 51  Temp: 96.9 F (36.1 C)   TempSrc: Oral   Resp: 14   Height: 5\' 9"  (1.753 m)   Weight: 182 lb (82.555 kg)   SpO2: 98%

## 2015-09-05 NOTE — Progress Notes (Signed)
[facility]  Established [facility]  History of Present Illness  [name] is a 70 y.o. (Nov 13, 1945) male  pt of Dr. 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 This was performed in August of 2011. He had an aorto to right external iliac and left common iliac Gore stent graft placed. Has low back pain for years, the AAA was found incidental to evaluation of low back pain, not improved with AAA repair, states he was told that he has arthritis in spine, this pain is aggravated by strenuous work and relieved by Aleve.  States he has pain in both hips in the morning which is alleviated by walking.  Pt. denies claudication in legs, denies non healing wounds, denies history of stroke or TIA symptoms.  States he walks a great deal, does not use ETOH.  Pt states his blood pressure was "excellent" in his cardiologist office recently.   Pt Diabetic: Yes, review of records: A1C on 08/20/15, was 7.1, fairly good control Pt smoker: smoker (rarel cigar)  Pt meds include: Statin :Yes Betablocker: Yes ASA: Yes Other anticoagulants/antiplatelets: no   Past Medical History  Diagnosis Date  . Hypertension   . GERD (gastroesophageal reflux disease)   . Joint pain   . Dizziness   . CAD (coronary artery disease)   . Iliac artery aneurysm ([place])   . Lower back pain Jan 2015  . Atrial fibrillation ([place])   . CAD s/p CABG 2006 08/16/2013    a. Dr Darcey Nora - to LAD, SVG to diagonal, SVG to OM, SVG to PDA ;  b.  LHC (9/15): ostial LAD occl, ostial CFX 60%, prox AVCFX 95%, RCA 95%, S-PDA occl, S-OM/Dx with Dx limb occl and prox 60%, L-LAD patent, EF 35-40% wtih ant-lat and apical HK-AK >> PCI:  BMS to native RCA   . Mixed hyperlipidemia 08/16/2013  . Myocardial infarction ([place]) 03/2014 X 2?  [name] Type II diabetes mellitus ([place])   . DM type 2 causing complication ([place]) 123456  . Arthritis     "back"  (04/05/2014)  . Kidney stones   . Ischemic cardiomyopathy     a. EF 35-40% by LV gram 03/2014   Past Surgical History  Procedure Laterality Date  . Coronary artery bypass graft  2006    LIMA to LAD,SVG to diagonal,SVG to obtuse marginal,SVG to posterior descending  . Iliac artery stent Left Aug. 10, 2011    Aorta to right ext iliac and left CIA stent repair   . Nm myocar perf wall motion  11/18/2009    low risk  . Tonsillectomy and adenoidectomy  1951  . Cystoscopy w/ stone manipulation  1970's X 2  . Cystoscopy w/ ureteroscopy w/ lithotripsy  ~ 2009  . Cardiac catheterization  2006    "before OHS"  . Coronary angioplasty with stent placement  04/05/2014    "1"  . Left heart catheterization with coronary/graft angiogram N/A 04/05/2014    Procedure: LEFT HEART CATHETERIZATION WITH [name];  Surgeon: [name], MD;  Location: [facility] CATH LAB;  Service: Cardiovascular;  Laterality: N/A;   Social History Social History  Substance Use Topics  . Smoking status: Light Tobacco Smoker -- 2 years    Types: Cigars  . Smokeless tobacco: Never Used  . Alcohol Use: No   Family History Family History  Problem Relation Age of Onset  . Diabetes Mother   . Hypertension  Mother   . Heart disease Mother     AAA  . Heart disease Sister   . Heart disease Son     Heart Disease before age 59   Current Outpatient Prescriptions on File Prior to Visit  Medication Sig Dispense Refill  . aspirin 81 MG tablet Take 81 mg by mouth daily.      . carvedilol (COREG) 6.25 MG tablet Take 1 tablet (6.25 mg total) by mouth 2 (two) times daily. 180 tablet 3  . CRESTOR 10 MG tablet Take 1 tablet (10 mg total) by mouth daily. 90 tablet 3  . insulin glargine (LANTUS) 100 UNIT/ML injection Inject 40 Units into the skin every morning.     [name] losartan (COZAAR) 50 MG tablet Take 1 tablet (50 mg total) by mouth 2 (two) times daily. 180 tablet 3  . nitroGLYCERIN (NITROSTAT) 0.4 MG SL tablet Place 1 tablet  (0.4 mg total) under the tongue every 5 (five) minutes as needed for chest pain (CALL 911 IF NO RELIEF AFTER 20 minutes). 25 tablet 12  . ondansetron (ZOFRAN) 8 MG tablet Take 4 mg by mouth every 8 (eight) hours as needed for nausea or vomiting.    . ONE TOUCH ULTRA TEST test strip     . pantoprazole (PROTONIX) 40 MG tablet Take 1 tablet (40 mg total) by mouth daily. 90 tablet 3   No current facility-administered medications on file prior to visit.   Allergies  Allergen Reactions  . Codeine Nausea Only    Cannot take on empty stomach.  . Valium Nausea Only    Cannot take on empty stomach.     ROS: See HPI for pertinent positives and negatives.  Physical Examination  Filed Vitals:   09/05/15 0917  BP: 182/81  Pulse: 53  Temp: 96.9 F (36.1 C)  TempSrc: Oral  Resp: 14  Height: 5\' 9"  (1.753 m)  Weight: 182 lb (82.555 kg)  SpO2: 98%   Body mass index is 26.86 kg/(m^2).  General: WDWN in NAD Gait: Normal HENT: WNL Eyes: Pupils equal Pulmonary: normal non-labored breathing , without Rales, rhonchi, wheezing Cardiac: RRR, without detected Murmurs.  Abdomen: soft, NT, no masses Skin: no rashes, no ulcers, no cellulitis.  VASCULAR EXAM  Carotid Bruits Left Right   Negative Negative   Aorta is not palpable  Radial pulses: 2+ palpable and =   VASCULAR EXAM: Extremities without ischemic changes  without Gangrene; without open wounds.     LE Pulses LEFT RIGHT   FEMORAL  palpable  not palpable    POPLITEAL 2+ palpable  2+ palpable   POSTERIOR TIBIAL 3+ palpable  3+ palpable    DORSALIS PEDIS  ANTERIOR TIBIAL 2+palpable  not palpable     Musculoskeletal: no muscle wasting or atrophy; no peripheral edema Neurologic: A&O X  3; Appropriate Affect ;  SENSATION: normal; MOTOR FUNCTION: 5/5 Symmetric, CN 2-12 intact Speech is fluent/normal          Non-Invasive Vascular Imaging  EVAR Duplex (Date: 09/05/2015) Patent stent of the abdominal aorta and iliac arteries with maximum diameter of right CIA at 1.41 cm, compared to 1.89 cm on 08/13/14. Decrease in the maximum diameter of the right CIA.   Maximum diameter of aorta is 2.69 cm.    Medical Decision Making  [name] is a 70 y.o. male who presents s/p EVAR (Date: 02/18/10).  Pt is asymptomatic with a decrease in right CIA sac size.  I discussed with the patient the importance  of surveillance of the endograft.  The next endograft duplex will be scheduled for 12 months.  The patient will follow up with [place] in 12 months with these studies.  I emphasized the importance of maximal medical management including strict control of blood pressure, blood glucose, and lipid levels, antiplatelet agents, obtaining regular exercise, and cessation of smoking.   Thank you for allowing [place] to participate in this patient's care.  Clemon Chambers, RN, MSN, FNP-C [facility] [place] Office: [phone]  Clinic Physician: Bridgett Larsson  09/05/2015, 9:24 AM

## 2015-09-06 ENCOUNTER — Other Ambulatory Visit: Payer: Self-pay | Admitting: Cardiovascular Disease

## 2015-09-11 ENCOUNTER — Other Ambulatory Visit: Payer: Self-pay | Admitting: Cardiovascular Disease

## 2015-09-11 NOTE — Telephone Encounter (Signed)
Rx request sent to pharmacy.  

## 2015-09-16 ENCOUNTER — Other Ambulatory Visit: Payer: Self-pay | Admitting: Gastroenterology

## 2015-09-16 DIAGNOSIS — D123 Benign neoplasm of transverse colon: Secondary | ICD-10-CM | POA: Diagnosis not present

## 2015-09-16 DIAGNOSIS — Z86010 Personal history of colonic polyps: Secondary | ICD-10-CM | POA: Diagnosis not present

## 2015-09-16 DIAGNOSIS — K5730 Diverticulosis of large intestine without perforation or abscess without bleeding: Secondary | ICD-10-CM | POA: Diagnosis not present

## 2015-09-16 DIAGNOSIS — D124 Benign neoplasm of descending colon: Secondary | ICD-10-CM | POA: Diagnosis not present

## 2015-09-16 DIAGNOSIS — K621 Rectal polyp: Secondary | ICD-10-CM | POA: Diagnosis not present

## 2015-09-16 DIAGNOSIS — D126 Benign neoplasm of colon, unspecified: Secondary | ICD-10-CM | POA: Diagnosis not present

## 2015-12-04 DIAGNOSIS — E1122 Type 2 diabetes mellitus with diabetic chronic kidney disease: Secondary | ICD-10-CM | POA: Diagnosis not present

## 2015-12-04 DIAGNOSIS — I129 Hypertensive chronic kidney disease with stage 1 through stage 4 chronic kidney disease, or unspecified chronic kidney disease: Secondary | ICD-10-CM | POA: Diagnosis not present

## 2015-12-11 DIAGNOSIS — E1122 Type 2 diabetes mellitus with diabetic chronic kidney disease: Secondary | ICD-10-CM | POA: Diagnosis not present

## 2015-12-11 DIAGNOSIS — N182 Chronic kidney disease, stage 2 (mild): Secondary | ICD-10-CM | POA: Diagnosis not present

## 2015-12-11 DIAGNOSIS — I129 Hypertensive chronic kidney disease with stage 1 through stage 4 chronic kidney disease, or unspecified chronic kidney disease: Secondary | ICD-10-CM | POA: Diagnosis not present

## 2016-03-12 DIAGNOSIS — I129 Hypertensive chronic kidney disease with stage 1 through stage 4 chronic kidney disease, or unspecified chronic kidney disease: Secondary | ICD-10-CM | POA: Diagnosis not present

## 2016-03-12 DIAGNOSIS — E1122 Type 2 diabetes mellitus with diabetic chronic kidney disease: Secondary | ICD-10-CM | POA: Diagnosis not present

## 2016-03-19 DIAGNOSIS — I129 Hypertensive chronic kidney disease with stage 1 through stage 4 chronic kidney disease, or unspecified chronic kidney disease: Secondary | ICD-10-CM | POA: Diagnosis not present

## 2016-03-19 DIAGNOSIS — I714 Abdominal aortic aneurysm, without rupture: Secondary | ICD-10-CM | POA: Diagnosis not present

## 2016-03-19 DIAGNOSIS — E1122 Type 2 diabetes mellitus with diabetic chronic kidney disease: Secondary | ICD-10-CM | POA: Diagnosis not present

## 2016-03-19 DIAGNOSIS — Z23 Encounter for immunization: Secondary | ICD-10-CM | POA: Diagnosis not present

## 2016-05-17 ENCOUNTER — Other Ambulatory Visit: Payer: Self-pay | Admitting: Cardiovascular Disease

## 2016-05-17 NOTE — Telephone Encounter (Signed)
Rx request sent to pharmacy.  

## 2016-06-15 DIAGNOSIS — E1122 Type 2 diabetes mellitus with diabetic chronic kidney disease: Secondary | ICD-10-CM | POA: Diagnosis not present

## 2016-06-15 DIAGNOSIS — I129 Hypertensive chronic kidney disease with stage 1 through stage 4 chronic kidney disease, or unspecified chronic kidney disease: Secondary | ICD-10-CM | POA: Diagnosis not present

## 2016-06-15 LAB — CBC AND DIFFERENTIAL
HCT: 43 % (ref 41–53)
Hemoglobin: 15.1 g/dL (ref 13.5–17.5)
Platelets: 162 10*3/uL (ref 150–399)
WBC: 8.3 10^3/mL

## 2016-06-15 LAB — LIPID PANEL
Cholesterol: 194 mg/dL (ref 0–200)
HDL: 26 mg/dL — AB (ref 35–70)
LDL Cholesterol: 117 mg/dL
Triglycerides: 255 mg/dL — AB (ref 40–160)

## 2016-06-15 LAB — HEMOGLOBIN A1C: Hemoglobin A1C: 6.6

## 2016-06-15 LAB — BASIC METABOLIC PANEL
BUN: 16 mg/dL (ref 4–21)
Creatinine: 1.1 mg/dL (ref 0.6–1.3)
Glucose: 119 mg/dL
Potassium: 4 mmol/L (ref 3.4–5.3)
Sodium: 142 mmol/L (ref 137–147)

## 2016-06-15 LAB — HEPATIC FUNCTION PANEL
ALT: 12 U/L (ref 10–40)
AST: 34 U/L (ref 14–40)
Alkaline Phosphatase: 75 U/L (ref 25–125)
Bilirubin, Total: 0.9 mg/dL

## 2016-06-15 LAB — TSH: TSH: 2.38 u[IU]/mL (ref 0.41–5.90)

## 2016-06-22 DIAGNOSIS — I10 Essential (primary) hypertension: Secondary | ICD-10-CM | POA: Diagnosis not present

## 2016-06-22 DIAGNOSIS — K219 Gastro-esophageal reflux disease without esophagitis: Secondary | ICD-10-CM | POA: Diagnosis not present

## 2016-06-22 DIAGNOSIS — E7800 Pure hypercholesterolemia, unspecified: Secondary | ICD-10-CM | POA: Diagnosis not present

## 2016-06-22 DIAGNOSIS — E119 Type 2 diabetes mellitus without complications: Secondary | ICD-10-CM | POA: Diagnosis not present

## 2016-08-13 ENCOUNTER — Other Ambulatory Visit: Payer: Self-pay | Admitting: Cardiovascular Disease

## 2016-08-16 ENCOUNTER — Encounter: Payer: Self-pay | Admitting: Family Medicine

## 2016-08-16 ENCOUNTER — Ambulatory Visit (INDEPENDENT_AMBULATORY_CARE_PROVIDER_SITE_OTHER): Payer: Medicare Other | Admitting: Family Medicine

## 2016-08-16 VITALS — BP 126/80 | HR 61 | Resp 12 | Ht 69.0 in | Wt 184.3 lb

## 2016-08-16 DIAGNOSIS — Z794 Long term (current) use of insulin: Secondary | ICD-10-CM | POA: Diagnosis not present

## 2016-08-16 DIAGNOSIS — E782 Mixed hyperlipidemia: Secondary | ICD-10-CM

## 2016-08-16 DIAGNOSIS — R110 Nausea: Secondary | ICD-10-CM | POA: Diagnosis not present

## 2016-08-16 DIAGNOSIS — J309 Allergic rhinitis, unspecified: Secondary | ICD-10-CM

## 2016-08-16 DIAGNOSIS — I10 Essential (primary) hypertension: Secondary | ICD-10-CM | POA: Diagnosis not present

## 2016-08-16 DIAGNOSIS — E1140 Type 2 diabetes mellitus with diabetic neuropathy, unspecified: Secondary | ICD-10-CM

## 2016-08-16 DIAGNOSIS — R51 Headache: Secondary | ICD-10-CM

## 2016-08-16 DIAGNOSIS — R519 Headache, unspecified: Secondary | ICD-10-CM

## 2016-08-16 LAB — MICROALBUMIN / CREATININE URINE RATIO
Creatinine,U: 248.1 mg/dL
Microalb Creat Ratio: 0.6 mg/g (ref 0.0–30.0)
Microalb, Ur: 1.6 mg/dL (ref 0.0–1.9)

## 2016-08-16 LAB — C-REACTIVE PROTEIN: CRP: 0.6 mg/dL (ref 0.5–20.0)

## 2016-08-16 LAB — SEDIMENTATION RATE: Sed Rate: 3 mm/hr (ref 0–20)

## 2016-08-16 MED ORDER — AZELASTINE HCL 0.1 % NA SOLN: 2.0000 | Freq: Two times a day (BID) | NASAL | 4 refills | Status: DC

## 2016-08-16 MED ORDER — ONDANSETRON HCL 4 MG PO TABS: 4.0000 mg | ORAL_TABLET | Freq: Every day | ORAL | 1 refills | Status: DC | PRN

## 2016-08-16 NOTE — Patient Instructions (Signed)
A few things to remember from today's visit:   Type 2 diabetes mellitus with diabetic neuropathy, with long-term current use of insulin ([place]) - Plan: Microalbumin / creatinine urine ratio  Essential hypertension  Mixed hyperlipidemia  Nausea without vomiting  Allergic rhinitis, unspecified chronicity, unspecified seasonality, unspecified trigger  Left temporal headache - Plan: Sed Rate (ESR), C-reactive protein   Lantus decreased to 35 unites.    Please be sure medication list is accurate. If a new problem present, please set up appointment sooner than planned today.

## 2016-08-16 NOTE — Progress Notes (Signed)
HPI:   [name] is a 71 y.o. male, who is here today with his wife to establish care with me.  Former PCP: Dr [name]  Last preventive routine visit: 06/2016. He follows with urologists due to nephrolithiasis, next appt 08/17/16.   Chronic medical problems: HTN,HLD,PAD,CAD,CHF,DM II, OA among some.  He follows regularly with cardiologists, Dr. Sallyanne Steele: CAD,CHF, Dr [name]: atrial fib, Dr [name] (vascular). 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  He mentions that he has noted some wt loss in the 2-3 years, which he attributes to changes in his diet.   Hyperlipidemia:  He was on Simvastatin until a few weeks ago, stopped taking it because it was not well tolerated: Muscle aching, mainly on LE, and "weakness", symptoms resolved after discontinuation of statin. Following a low fat diet: Yes He does not exercise regularly, active around his house with yard chores.[name]  "All" statin medications he has tried have caused muscle weakness and aching.  Last FLP in 06/15/2016 TC 194, TG 255(336), HDL 26 (23), and LDL 177 (77).  Diabetes Mellitus II:   Currently on Lantus 38 U daily.  Checking BS's : FG 120-180's. Post prandial <200's Hypoglycemia:1-2 times daily he has episodes of sudden craving for food and nausea, he has not checked BS during episodes but improved by eating something. If he skips meals he feels "bad."  He denies abdominal pain, nausea, vomiting, polydipsia, polyuria, or polyphagia. Feet with intermittent tingling,burning, needle/stubbing pain.  HgA1C 06/15/16 was 6.6 (6.8).    Hypertension: Currently he is on Cozaar 50 mg twice daily and carvedilol 6.25 mg twice daily. Denies severe/frequent headache, visual changes, chest pain, dyspnea, palpitation, claudication, focal weakness, or edema.  Concerns today: Left fronto-parietal-temporal headache for about a month and getting better. He thinks this may be related to "sinus infection", he also has Hx of  allergic rhinitis and has followed with ENT. No sick contact. He states that pain is "not bad", he took Tylenol a couple times, "soreness." No associated nasal congestion or worsening rhinorrhea.  -He also mentions that today he started with mild nausea, he has not had a bowel movement today. He usually has 2-3 bowel movements daily. + Flatus. He denies fever,chills, odynophagia, abdominal pain, vomiting, dysuria, gross hematuria,or MS changes.   He is requesting refill on Zofran, he states that after cardiac surgery he has had intermittent nausea, sudden onset, alleviated by Zofran.He has not identified exacerbating factors. GERD, he is on Protonix 40 mg daily.     Review of Systems  Constitutional: Negative for activity change, appetite change, fatigue, fever and unexpected weight change.  HENT: Positive for postnasal drip and sneezing. Negative for dental problem, nosebleeds, rhinorrhea, sinus pain, sore throat and trouble swallowing.   Eyes: Negative for pain, redness and visual disturbance.  Respiratory: Negative for cough, shortness of breath and wheezing.   Cardiovascular: Negative for chest pain, palpitations and leg swelling.  Gastrointestinal: Positive for nausea (Seems chronic.). Negative for abdominal pain, blood in stool and vomiting.       Last bowel movement yesterday. Usually 2-3 times daily.  Endocrine: Negative for polydipsia, polyphagia and polyuria.  Genitourinary: Negative for decreased urine volume and hematuria.  Musculoskeletal: Positive for arthralgias and back pain. Negative for gait problem, joint swelling and neck pain.  Skin: Negative for rash.  Allergic/Immunologic: Positive for environmental allergies.  Neurological: Positive for headaches. Negative for dizziness, syncope and weakness.       Occasional stubbing ,burning sensation on feet.  Psychiatric/Behavioral: Negative for confusion and sleep disturbance. The patient is nervous/anxious.        Current Outpatient Prescriptions on File Prior to Visit  Medication Sig Dispense Refill  . aspirin 81 MG tablet Take 81 mg by mouth daily.      . carvedilol (COREG) 6.25 MG tablet TAKE 1 TABLET BY MOUTH TWO  TIMES DAILY 180 tablet 3  . clopidogrel (PLAVIX) 75 MG tablet TAKE 1 TABLET BY MOUTH  DAILY WITH BREAKFAST 30 tablet 0  . insulin glargine (LANTUS) 100 UNIT/ML injection Inject 38 Units into the skin every morning.     [name] losartan (COZAAR) 50 MG tablet TAKE 1 TABLET BY MOUTH TWO  TIMES DAILY 180 tablet 3  . nitroGLYCERIN (NITROSTAT) 0.4 MG SL tablet Place 1 tablet (0.4 mg total) under the tongue every 5 (five) minutes as needed for chest pain (CALL 911 IF NO RELIEF AFTER 20 minutes). 25 tablet 12  . ONE TOUCH ULTRA TEST test strip     . pantoprazole (PROTONIX) 40 MG tablet TAKE 1 TABLET BY MOUTH  DAILY 30 tablet 0   No current facility-administered medications on file prior to visit.      Past Medical History:  Diagnosis Date  . Arthritis    "back" (04/05/2014)  . Atrial fibrillation ([place])   . Blood in stool   . CAD (coronary artery disease)   . 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 Dr [name] - to LAD, SVG to diagonal, SVG to OM, SVG to PDA ;  b.  LHC (9/15): ostial LAD occl, ostial CFX 60%, prox AVCFX 95%, RCA 95%, S-PDA occl, S-OM/Dx with Dx limb occl and prox 60%, L-LAD patent, EF 35-40% wtih ant-lat and apical HK-AK >> PCI:  BMS to native RCA   . Diverticulosis   . Dizziness   . DM type 2 causing complication ([place]) 03/21/3715  . GERD (gastroesophageal reflux disease)   . Hypertension   . Iliac artery aneurysm ([place])   . Ischemic cardiomyopathy    a. EF 35-40% by LV gram 03/2014  . Joint pain   . Kidney stones   . Kidney stones   . Lower back pain Jan 2015  . Mixed hyperlipidemia 08/16/2013  . Myocardial infarction 03/2014 X 2?  [name] Type II diabetes mellitus ([facility])    Allergies  Allergen Reactions  . Codeine Nausea Only    Cannot take on empty stomach.  . Valium Nausea  Only    Cannot take on empty stomach.    Family History  Problem Relation Age of Onset  . Diabetes Mother   . Hypertension Mother   . Heart disease Mother     AAA  . Heart disease Sister   . Heart disease Son     Heart Disease before age 77  . Diabetes Brother     Social History   Social History  . Marital status: Married    Spouse name: N/A  . Number of children: N/A  . Years of education: N/A   Social History Main Topics  . Smoking status: Light Tobacco Smoker    Years: 2.00    Types: Cigars  . Smokeless tobacco: Never Used  . Alcohol use No  . Drug use: No  . Sexual activity: Yes   Other Topics Concern  . None   Social History Narrative  . None    Vitals:   08/16/16 1414  BP: 126/80  Pulse: 61  Resp: 12   O2 sat at RA  97% Body mass index is 27.22 kg/m.   Physical Exam  Nursing note and vitals reviewed. Constitutional: He is oriented to person, place, and time. He appears well-developed and well-nourished. No distress.  HENT:  Head: Atraumatic.  Nose: Right sinus exhibits no maxillary sinus tenderness and no frontal sinus tenderness. Left sinus exhibits no maxillary sinus tenderness and no frontal sinus tenderness.  Mouth/Throat: Oropharynx is clear and moist and mucous membranes are normal. He has dentures.  Eyes: Conjunctivae and EOM are normal. Pupils are equal, round, and reactive to light.  Neck: No JVD present.  Cardiovascular: Normal rate and regular rhythm.   No murmur heard. Pulses:      Dorsalis pedis pulses are 2+ on the right side, and 2+ on the left side.  Left temporal artery visible, no pulsatile, no tenderness upon palpation.  Respiratory: Effort normal and breath sounds normal. No respiratory distress.  GI: Soft. He exhibits no mass. There is no hepatomegaly. There is no tenderness.  Musculoskeletal: He exhibits no edema.  Lymphadenopathy:    He has no cervical adenopathy.  Neurological: He is alert and oriented to person,  place, and time. He has normal strength. No cranial nerve deficit. Coordination normal.  Stable gait with no assistance.He has some difficulty standing up.  Skin: Skin is warm. No erythema.  Psychiatric: He has a normal mood and affect. Cognition and memory are normal.  Well groomed, good eye contact.    Diabetic foot exam:  Monofilament normal bilateral. Peripheral pulses present (DP). No calluses + Hypertrophic/long toenails.   ASSESSMENT AND PLAN:   [name] was seen today for establish care.  Diagnoses and all orders for this visit:  Type 2 diabetes mellitus with diabetic neuropathy, with long-term current use of insulin ([place])  HgA1C was at goal. Because possible episodes of hypoglycemia, I recommended decreasing the Lantus from 38 units to 35 units daily. Goal < 7.5. Regular exercise and healthy diet with avoidance of added sugar food intake is an important part of treatment and recommended. Annual eye exam, periodic dental and foot care recommended. F/U in 4 months  -     Microalbumin / creatinine urine ratio  Mixed hyperlipidemia  Discussed general benefits of statin medications, especially in his case given his history of DM 2, CAD, PAD. He is not interested in trying another one, he states that he has tried them "all" He may benefit from [facility] K-9 inhibitor. Continue low fat diet.  Essential hypertension  Adequately controlled. No changes in current management. DASH-low salt diet recommended. Eye exam recommended annually. F/U in 4 months, before if needed.   Nausea without vomiting  Chronic. Hx of GERD. ? Hypoglycemia. Instructed about warning signs. Zofran decreased from 8 mg to 4 mg daily as needed. Small and frequent meals recommended.  -     ondansetron (ZOFRAN) 4 MG tablet; Take 1 tablet (4 mg total) by mouth daily as needed for nausea or vomiting.  Allergic rhinitis, unspecified chronicity, unspecified seasonality, unspecified trigger  I don't  think he has a sinus infection currently. Nasal irrigations as needed during the day with saline might help. He will try Astelin nasal spray. Follow-up as needed.  -     azelastine (ASTELIN) 0.1 % nasal spray; Place 2 sprays into both nostrils 2 (two) times daily. Use in each nostril as directed  Left temporal headache  He is reporting improvement. Neurologic examination today does not suggest a serious process. Instructed about warning signs.   -  Sed Rate (ESR) -     C-reactive protein     Sherell Christoffel G. [name], MD  [facility]. [place] office.

## 2016-08-16 NOTE — Progress Notes (Signed)
Pre visit review using our clinic review tool, if applicable. No additional management support is needed unless otherwise documented below in the visit note. 

## 2016-08-17 DIAGNOSIS — N200 Calculus of kidney: Secondary | ICD-10-CM | POA: Diagnosis not present

## 2016-08-17 DIAGNOSIS — N401 Enlarged prostate with lower urinary tract symptoms: Secondary | ICD-10-CM | POA: Diagnosis not present

## 2016-09-01 ENCOUNTER — Encounter: Payer: Self-pay | Admitting: Vascular Surgery

## 2016-09-02 ENCOUNTER — Other Ambulatory Visit: Payer: Self-pay | Admitting: *Deleted

## 2016-09-02 DIAGNOSIS — I723 Aneurysm of iliac artery: Secondary | ICD-10-CM

## 2016-09-07 ENCOUNTER — Ambulatory Visit (INDEPENDENT_AMBULATORY_CARE_PROVIDER_SITE_OTHER): Payer: Medicare Other | Admitting: Family

## 2016-09-07 ENCOUNTER — Ambulatory Visit (HOSPITAL_COMMUNITY)
Admission: RE | Admit: 2016-09-07 | Discharge: 2016-09-07 | Disposition: A | Discharge status: Home / Self Care | Payer: Medicare Other | Source: Ambulatory Visit | Attending: Vascular Surgery | Admitting: Vascular Surgery

## 2016-09-07 ENCOUNTER — Encounter: Payer: Self-pay | Admitting: Family

## 2016-09-07 VITALS — BP 157/88 | HR 56 | Temp 97.1°F | Resp 20 | Ht 69.0 in | Wt 181.5 lb

## 2016-09-07 DIAGNOSIS — I723 Aneurysm of iliac artery: Secondary | ICD-10-CM | POA: Insufficient documentation

## 2016-09-07 DIAGNOSIS — R0989 Other specified symptoms and signs involving the circulatory and respiratory systems: Secondary | ICD-10-CM | POA: Diagnosis not present

## 2016-09-07 DIAGNOSIS — Z95828 Presence of other vascular implants and grafts: Secondary | ICD-10-CM

## 2016-09-07 DIAGNOSIS — E1151 Type 2 diabetes mellitus with diabetic peripheral angiopathy without gangrene: Secondary | ICD-10-CM

## 2016-09-07 NOTE — Progress Notes (Signed)
Thank you for follow up.  

## 2016-09-07 NOTE — Patient Instructions (Addendum)
  Before your next abdominal ultrasound:  Take two Extra-Strength Gas-X capsules at bedtime the night before the test. Take another two Extra-Strength Gas-X capsules 3 hours before the test.   

## 2016-09-07 NOTE — Progress Notes (Signed)
[facility] [place]  CC: Follow up s/p EVAR  History of Present Illness  [name] is a 71 y.o. (1945-11-02) male patient of Dr. 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 This was performed in August of 2011. He had an aorto to right external iliac and left common iliac Gore stent graft placed. Has low back pain for years, the iliac artery aneurysm was found incidental to evaluation of low back pain, not improved with repair, states he was told that he has arthritis in spine, this pain is aggravated by strenuous work and relieved by Aleve.  States he has pain in both hips in the morning which is alleviated by walking.  Pt. denies claudication in legs, denies non healing wounds, denies history of stroke or TIA symptoms.  States he walks a great deal, does not use ETOH.  Pt Diabetic: Yes, review of records: A1C on 06/15/16, was 6.6,  good control Pt smoker: smoker (rare cigar)  Pt meds include: Statin :no, states he stopped it due to feeling very weak, states Dr. [name], his PCP is aware, states he will see his cardiologist soon Betablocker: Yes ASA: Yes Other anticoagulants/antiplatelets: Plavix   Past Medical History:  Diagnosis Date  . Arthritis    "back" (04/05/2014)  . Atrial fibrillation ([place])   . Blood in stool   . CAD (coronary artery disease)   . 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 Dr Darcey Nora - to LAD, SVG to diagonal, SVG to OM, SVG to PDA ;  b.  LHC (9/15): ostial LAD occl, ostial CFX 60%, prox AVCFX 95%, RCA 95%, S-PDA occl, S-OM/Dx with Dx limb occl and prox 60%, L-LAD patent, EF 35-40% wtih ant-lat and apical HK-AK >> PCI:  BMS to native RCA   . Diverticulosis   . Dizziness   . DM type 2 causing complication ([place]) 123456  . GERD (gastroesophageal reflux disease)   . Hypertension   . Iliac artery aneurysm ([place])   . Ischemic cardiomyopathy    a. EF 35-40% by  LV gram 03/2014  . Joint pain   . Kidney stones   . Kidney stones   . Lower back pain Jan 2015  . Mixed hyperlipidemia 08/16/2013  . Myocardial infarction 03/2014 X 2?  [name] Type II diabetes mellitus ([place])    Past Surgical History:  Procedure Laterality Date  . CARDIAC CATHETERIZATION  2006   "before [facility]"  . CORONARY ANGIOPLASTY WITH STENT PLACEMENT  04/05/2014   "1"  . CORONARY ARTERY BYPASS GRAFT  2006   LIMA to LAD,SVG to diagonal,SVG to obtuse marginal,SVG to posterior descending  . CYSTOSCOPY W/ STONE MANIPULATION  1970's X 2  . CYSTOSCOPY W/ URETEROSCOPY W/ LITHOTRIPSY  ~ 2009  . ILIAC ARTERY STENT Left Aug. 10, 2011   Aorta to right ext iliac and left CIA stent repair   . LEFT HEART CATHETERIZATION WITH CORONARY/GRAFT ANGIOGRAM N/A 04/05/2014   Procedure: LEFT HEART CATHETERIZATION WITH [name];  Surgeon: [name], MD;  [facility] C[facility];  Service: Cardiovascular;  Laterality: N/A;  . NM MYOCAR PERF WALL MOTION  11/18/2009   low risk  . TONSILLECTOMY AND ADENOIDECTOMY  1951   Social History Social History  Substance Use Topics  . Smoking status: Light Tobacco Smoker    Years: 2.00    Types: Cigars  . Smokeless tobacco: Never Used  . Alcohol use No  Family History Family History  Problem Relation Age of Onset  . Diabetes Mother   . Hypertension Mother   . Heart disease Mother     AAA  . Heart disease Sister   . Heart disease Son     Heart Disease before age 85  . Diabetes Brother    Current Outpatient Prescriptions on File Prior to Visit  Medication Sig Dispense Refill  . aspirin 81 MG tablet Take 81 mg by mouth daily.      . carvedilol (COREG) 6.25 MG tablet TAKE 1 TABLET BY MOUTH TWO  TIMES DAILY 180 tablet 3  . clopidogrel (PLAVIX) 75 MG tablet TAKE 1 TABLET BY MOUTH  DAILY WITH BREAKFAST 30 tablet 0  . insulin glargine (LANTUS) 100 UNIT/ML injection Inject 38 Units into the skin every morning.     [name] losartan (COZAAR) 50 MG tablet TAKE  1 TABLET BY MOUTH TWO  TIMES DAILY 180 tablet 3  . nitroGLYCERIN (NITROSTAT) 0.4 MG SL tablet Place 1 tablet (0.4 mg total) under the tongue every 5 (five) minutes as needed for chest pain (CALL 911 IF NO RELIEF AFTER 20 minutes). 25 tablet 12  . ondansetron (ZOFRAN) 4 MG tablet Take 1 tablet (4 mg total) by mouth daily as needed for nausea or vomiting. 15 tablet 1  . ONE TOUCH ULTRA TEST test strip     . pantoprazole (PROTONIX) 40 MG tablet TAKE 1 TABLET BY MOUTH  DAILY 30 tablet 0  . azelastine (ASTELIN) 0.1 % nasal spray Place 2 sprays into both nostrils 2 (two) times daily. Use in each nostril as directed (Patient not taking: Reported on 09/07/2016) 30 mL 4   No current facility-administered medications on file prior to visit.    Allergies  Allergen Reactions  . Codeine Nausea Only    Cannot take on empty stomach.  . Valium Nausea Only    Cannot take on empty stomach.     ROS: See HPI for pertinent positives and negatives.  Physical Examination  Vitals:   09/07/16 0912 09/07/16 0914  BP: (!) 180/89 (!) 157/88  Pulse: (!) 56   Resp: 20   Temp: 97.1 F (36.2 C)   TempSrc: Oral   SpO2: 97%   Weight: 181 lb 8 oz (82.3 kg)   Height: 5\' 9"  (1.753 m)    Body mass index is 26.8 kg/m.  General: WDWN in NAD Gait: Normal HENT: WNL Eyes: Pupils equal Pulmonary: normal non-labored breathing, CTAB, good air movement in all fields.  Cardiac: RRR, no detectedmurmur.  Abdomen: soft, NT, no palpable masses Skin: no rashes, no ulcers, no cellulitis.  VASCULAR EXAM  Carotid Bruits Left Right   Negative Negative   Aorta is not palpable  Radial pulses: 2+ palpable and =   VASCULAR EXAM: Extremities without ischemic changes  without Gangrene; without open wounds.     LE Pulses LEFT RIGHT    FEMORAL  palpable  not palpable    POPLITEAL 3+ palpable  3+ palpable   POSTERIOR TIBIAL 3+ palpable  3+ palpable    DORSALIS PEDIS  ANTERIOR TIBIAL 2+palpable  not palpable     Musculoskeletal: no muscle wasting or atrophy; no peripheral edema Neurologic: A&O X 3; Appropriate Affect ;  SENSATION: normal; MOTOR FUNCTION: 5/5 Symmetric, CN 2-12 intact Speech is fluent/normal   Non-Invasive Vascular Imaging  EVAR Duplex (Date: 09-07-16)  Right CIA: 1.3 cm, Left CIA: 1.2 cm, abdominal aorta: 2.1 cm. Decreased visualization of the abdominal vasculature due to overlying  bowel gas  no endoleak detected  09-05-15: right CIA: 1.41 cm   Medical Decision Making   [name] is a 71 y.o. male who presents s/p EVAR (Date: 02/18/10).  Pt is asymptomatic with a decrease in right CIA sac size to 1.3 cm today, compared to 1.41 cm on 09-05-15, based on limited visualization as above.   Pt has prominent bilateral popliteal pulses with no claudication symptoms with walking, will check popliteal duplex when he returns in a year since no results for this are on file.   Pt states his new PCP is Dr. [name]; I advised him to see her as soon as possible re his elevated blood pressure.   I discussed with the patient the importance of surveillance of the endograft.  The next endograft duplex will be scheduled for 12 months.  The patient will follow up with [place] in 12 months with these studies.  I emphasized the importance of maximal medical management including strict control of blood pressure, blood glucose, and lipid levels, antiplatelet agents, obtaining regular exercise, and cessation of smoking.   Thank you for allowing [place] to participate in this patient's care.  Clemon Chambers, RN, MSN, FNP-C [facility] [place] Office: [phone]  Clinic Physician: Early  09/07/2016, 9:14 AM

## 2016-09-08 NOTE — Addendum Note (Signed)
Addended by: Lianne Cure A on: 09/08/2016 04:31 PM   Modules accepted: Orders

## 2016-09-28 ENCOUNTER — Ambulatory Visit (INDEPENDENT_AMBULATORY_CARE_PROVIDER_SITE_OTHER): Payer: Medicare Other | Admitting: Cardiovascular Disease

## 2016-09-28 ENCOUNTER — Encounter: Payer: Self-pay | Admitting: Cardiovascular Disease

## 2016-09-28 VITALS — BP 128/62 | HR 46 | Ht 69.0 in | Wt 183.0 lb

## 2016-09-28 DIAGNOSIS — Z794 Long term (current) use of insulin: Secondary | ICD-10-CM

## 2016-09-28 DIAGNOSIS — I739 Peripheral vascular disease, unspecified: Secondary | ICD-10-CM

## 2016-09-28 DIAGNOSIS — R001 Bradycardia, unspecified: Secondary | ICD-10-CM | POA: Diagnosis not present

## 2016-09-28 DIAGNOSIS — E1140 Type 2 diabetes mellitus with diabetic neuropathy, unspecified: Secondary | ICD-10-CM

## 2016-09-28 DIAGNOSIS — I5032 Chronic diastolic (congestive) heart failure: Secondary | ICD-10-CM

## 2016-09-28 DIAGNOSIS — I2510 Atherosclerotic heart disease of native coronary artery without angina pectoris: Secondary | ICD-10-CM

## 2016-09-28 DIAGNOSIS — E782 Mixed hyperlipidemia: Secondary | ICD-10-CM

## 2016-09-28 MED ORDER — ROSUVASTATIN CALCIUM 10 MG PO TABS: 10.0000 mg | ORAL_TABLET | ORAL | 11 refills | Status: DC

## 2016-09-28 NOTE — Patient Instructions (Addendum)
Your physician has recommended you make the following change in your medication: 1. START Rosuvastatin (Crestor) 10 mg - take 1 tablet by mouth twice a week  Your physician recommends that you return for lab work in 3 months - FASTING.  Dr Sallyanne Kuster recommends that you schedule a follow-up appointment in 6 months. You will receive a reminder letter in the mail two months in advance. If you don't receive a letter, please call our office to schedule the follow-up appointment.  If you need a refill on your cardiac medications before your next appointment, please call your pharmacy.

## 2016-09-28 NOTE — Progress Notes (Signed)
Patient ID: [name], male   DOB: 05-25-1946, 71 y.o.   MRN: [id number]    Cardiology Office Note    Date:  09/30/2016   ID:  Scott, Steele 03/14/1946, MRN [id number]  PCP:  Scott [name], MD  Cardiologist:   Sanda Klein, MD   No chief complaint on file.   History of Present Illness:  [name] is a 71 y.o. male with a long-standing history of coronary artery disease who returns for follow-up. He is asymptomatic. He is still able to split wood and carry it into the wood shed without shortness of breath or chest discomfort.    Although we reduced his beta blocker dose, he remains very bradycardic. He denies fatigue, dizziness or syncope.  Stopped daily rosuvastatin statin due to generalized myalgia and muscle weakness, especially severe leg weakness. Symptoms then resolved  He underwent four-vessel bypass surgery in 2006 (LIMA-LAD, SVG x 3 to Diagonal, OM and PDA). Cardiac cath on 04/05/14 demonstrated worsening LVF with EF 35-40% and anterolateral and apical hypo-akinesis, S-PDA occluded, S-Dx/OM with prox 60% and dist limb occluded (to diagonal), native AVCFX with 95%, L-LAD patent. Native RCA with prox 95% stenosis treated with BMS. Follow-up echocardiogram January 2016 shows normal left ventricular systolic function with ejection fraction of 55-60 %and a moderately dilated left atrium.  He has received a stent in the large right common iliac artery aneurysm and is monitored by Dr. Kellie Steele. He has insulin requiring diabetes mellitus. He has chronically low HDL cholesterol, around 27 mg/dL.  Past Medical History:  Diagnosis Date  . Arthritis    "back" (04/05/2014)  . Atrial fibrillation ([place])   . Blood in stool   . CAD (coronary artery disease)   . 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 Dr Darcey Nora - to LAD, SVG to diagonal, SVG to OM, SVG to PDA ;  b.  LHC (9/15): ostial LAD occl, ostial CFX 60%, prox AVCFX 95%, RCA 95%, S-PDA occl, S-OM/Dx with Dx limb occl and prox 60%, L-LAD  patent, EF 35-40% wtih ant-lat and apical HK-AK >> PCI:  BMS to native RCA   . Diverticulosis   . Dizziness   . DM type 2 causing complication ([place]) 12/16/8936  . GERD (gastroesophageal reflux disease)   . Hypertension   . Iliac artery aneurysm ([place])   . Ischemic cardiomyopathy    a. EF 35-40% by LV gram 03/2014  . Joint pain   . Kidney stones   . Kidney stones   . Lower back pain Jan 2015  . Mixed hyperlipidemia 08/16/2013  . Myocardial infarction 03/2014 X 2?  [name] Type II diabetes mellitus ([place])     Past Surgical History:  Procedure Laterality Date  . CARDIAC CATHETERIZATION  2006   "before [facility]"  . CORONARY ANGIOPLASTY WITH STENT PLACEMENT  04/05/2014   "1"  . CORONARY ARTERY BYPASS GRAFT  2006   LIMA to LAD,SVG to diagonal,SVG to obtuse marginal,SVG to posterior descending  . CYSTOSCOPY W/ STONE MANIPULATION  1970's X 2  . CYSTOSCOPY W/ URETEROSCOPY W/ LITHOTRIPSY  ~ 2009  . ILIAC ARTERY STENT Left Aug. 10, 2011   Aorta to right ext iliac and left CIA stent repair   . LEFT HEART CATHETERIZATION WITH CORONARY/GRAFT ANGIOGRAM N/A 04/05/2014   Procedure: LEFT HEART CATHETERIZATION WITH [name];  Surgeon: [name], MD;  Location: [facility] CATH LAB;  Service: Cardiovascular;  Laterality: N/A;  . NM MYOCAR PERF WALL MOTION  11/18/2009  low risk  . TONSILLECTOMY AND ADENOIDECTOMY  1951    Outpatient Medications Prior to Visit  Medication Sig Dispense Refill  . aspirin 81 MG tablet Take 81 mg by mouth daily.      [name] azelastine (ASTELIN) 0.1 % nasal spray Place 2 sprays into both nostrils 2 (two) times daily. Use in each nostril as directed 30 mL 4  . carvedilol (COREG) 6.25 MG tablet TAKE 1 TABLET BY MOUTH TWO  TIMES DAILY 180 tablet 3  . clopidogrel (PLAVIX) 75 MG tablet TAKE 1 TABLET BY MOUTH  DAILY WITH BREAKFAST 30 tablet 0  . insulin glargine (LANTUS) 100 UNIT/ML injection Inject 38 Units into the skin every morning.     [name] losartan (COZAAR) 50 MG tablet TAKE 1 TABLET  BY MOUTH TWO  TIMES DAILY 180 tablet 3  . nitroGLYCERIN (NITROSTAT) 0.4 MG SL tablet Place 1 tablet (0.4 mg total) under the tongue every 5 (five) minutes as needed for chest pain (CALL 911 IF NO RELIEF AFTER 20 minutes). 25 tablet 12  . ondansetron (ZOFRAN) 4 MG tablet Take 1 tablet (4 mg total) by mouth daily as needed for nausea or vomiting. 15 tablet 1  . ONE TOUCH ULTRA TEST test strip     . pantoprazole (PROTONIX) 40 MG tablet TAKE 1 TABLET BY MOUTH  DAILY 30 tablet 0   No facility-administered medications prior to visit.      Allergies:   Codeine and Valium   Social History   Social History  . Marital status: Married    Spouse name: N/A  . Number of children: N/A  . Years of education: N/A   Social History Main Topics  . Smoking status: Light Tobacco Smoker    Years: 2.00    Types: Cigars  . Smokeless tobacco: Never Used  . Alcohol use No  . Drug use: No  . Sexual activity: Yes   Other Topics Concern  . None   Social History Narrative  . None     Family History:  The patient's family history includes Diabetes in his brother and mother; Heart disease in his mother, sister, and son; Hypertension in his mother.   ROS:   Please see the history of present illness.    ROS All other systems reviewed and are negative.   PHYSICAL EXAM:   VS:  BP 128/62   Pulse (!) 46   Ht 5\' 9"  (1.753 m)   Wt 83 kg (183 lb)   BMI 27.02 kg/m    GEN: Well nourished, well developed, in no acute distress  HEENT: normal  Neck: no JVD, carotid bruits, or masses Cardiac: RRR; no murmurs, rubs, or gallops,no edema  Respiratory:  clear to auscultation bilaterally, normal work of breathing GI: soft, nontender, nondistended, + BS MS: no deformity or atrophy  Skin: warm and dry, no rash Neuro:  Alert and Oriented x 3, Strength and sensation are intact Psych: euthymic mood, full affect  Wt Readings from Last 3 Encounters:  09/28/16 83 kg (183 lb)  09/07/16 82.3 kg (181 lb 8 oz)    08/16/16 83.6 kg (184 lb 5 oz)      Studies/Labs Reviewed:   EKG:  EKG is ordered today. It shows marked sinus bradycardia at 46 bpm, incomplete right bundle branch block and right axis deviation. The QRS is marginally prolonged 104 ms. QT 508 ms is probably normal for this degree of bradycardia  Recent Labs: 06/15/2016 hemoglobin 15.1, glucose 119, normal LFTs, creatinine 1.06, potassium  4.0 Hemoglobin A1c 6.6%, total cholesterol 194, HDL 26, LDL 117, triglycerides 255.  In September 2017 when he was taking his daily statin his total cholesterol was 167 and LDL 77, A1c 6.8%  ASSESSMENT:    1. Chronic diastolic heart failure ([place])   2. Coronary artery disease involving native coronary artery of native heart without angina pectoris   3. Bradycardia, sinus   4. PAD (peripheral artery disease) ([place])   5. Hyperlipidemia, mixed   6. Type 2 diabetes mellitus with diabetic neuropathy, with long-term current use of insulin ([facility])      PLAN:  In order of problems listed above:  1. CHF: Left ventricular systolic function has returned to normal as of his most recent echocardiogram. NYHA functional class I. Clinically euvolemic, normal BNP. No changes made to his medications today. 2. CAD: Asymptomatic, free of angina. He is beyond the window for restenosis following his last revascularization, but his bypass grafts are almost 71 years old and he may well develop progression of disease in the years to come. We'll keep on clopidogrel lifelong if there are no bleeding complications 3. Bradycardia: Fatigue has improved on a lower dose of carvedilol and he still has well-controlled symptoms and normal blood pressure. I asked him to call promptly if she develops dizziness, excessive fatigue or syncope. We may have to cut back on the beta blocker further. 4. PAD: History of stent graft for aorto-iliac aneurysm in 2011, being followed in the vascular surgery clinic. Last seen in the vascular clinic on  09/07/2016 5. HLP: We'll try to restart the rosuvastatin on a twice-weekly basis and add coenzyme Q 10. LDL parameters were acceptable while on statin, but too high at this point. Also encouraged him to lose weight and walk more since this may help with his stability low HDL cholesterol. Consider adding a fibrate. 6. DM: Excellent control on once daily insulin as monotherapy    Medication Adjustments/Labs and Tests Ordered: Current medicines are reviewed at length with the patient today.  Concerns regarding medicines are outlined above.  Medication changes, Labs and Tests ordered today are listed in the Patient Instructions below. Patient Instructions  Your physician has recommended you make the following change in your medication: 1. START Rosuvastatin (Crestor) 10 mg - take 1 tablet by mouth twice a week  Your physician recommends that you return for lab work in 3 months - FASTING.  Dr Sallyanne Kuster recommends that you schedule a follow-up appointment in 6 months. You will receive a reminder letter in the mail two months in advance. If you don't receive a letter, please call our office to schedule the follow-up appointment.  If you need a refill on your cardiac medications before your next appointment, please call your pharmacy.      Signed, Sanda Klein, MD  09/30/2016 6:39 PM    [facility] [address], [place], [place]  [address] Phone: [phone]; Fax: [phone]

## 2016-10-01 ENCOUNTER — Other Ambulatory Visit: Payer: Self-pay

## 2016-10-01 MED ORDER — INSULIN GLARGINE 100 UNIT/ML ~~LOC~~ SOLN: 35.0000 [IU] | SUBCUTANEOUS | 1 refills | Status: DC

## 2016-10-06 ENCOUNTER — Telehealth: Payer: Self-pay

## 2016-10-06 NOTE — Telephone Encounter (Signed)
Pt's wife notified. Okay per DPR. Also reviewed recommendation with pt's grand-daughter who will let pt know as well. Medical record updated.

## 2016-10-06 NOTE — Telephone Encounter (Signed)
-----   Message from Sanda Klein, MD sent at 09/30/2016  6:39 PM EDT ----- Please remind him to take CoQ10 300 mg daily. MCr

## 2016-11-18 ENCOUNTER — Ambulatory Visit: Payer: Medicare Other | Admitting: Family Medicine

## 2016-12-06 NOTE — Progress Notes (Signed)
HPI:   [name] is a 71 y.o. male, who is here today for 3-4 months follow up.   She was last seen on 08/16/16. Since her last OV she has followed with vascular surgery and cardiologists for aortic stent graft for right iliac artery aneurysm and  CAD and CHF respectively.   DM II:  Dx in 2006. Currently he is on Lantus 35 U daily, which was decreased last OV. According to pt, pharmacists suggested to go back to 40 U on Rx,so he can receive more pens per month. Hypoglycemic events: Denies any < 70-80. BS"s: 112-170's. He skips Lantus when BS is "low", since his last OV twice: 112 and 115.  Last eye exam:10/2016. According topt, no retinopathy, next appt in a year.  Denies abdominal pain, nausea,vomiting, polydipsia,polyuria, or polyphagia.  Lab Results  Component Value Date   HGBA1C 6.6 06/15/2016   Lab Results  Component Value Date   MICROALBUR 1.6 08/16/2016   Some times stinging and buring on feet, "he cannot stand it" but last a few min. He has not identified exacerbating or alleviating factors.  Hypertension:   + CAD and CHF. Currently on Cozaar 50 mg daily and  Coreg 6.25 mg bid. BP readings at home: Not checking it. Noted mild bradycardia,denies dizziness or palpitations. He is taking medications as instructed, no side effects reported.  He has not noted unusual headache, visual changes, exertional chest pain, dyspnea,  focal weakness, or edema.   Lab Results  Component Value Date   CREATININE 1.1 06/15/2016   BUN 16 06/15/2016   NA 142 06/15/2016   K 4.0 06/15/2016   CL 102 04/06/2014   CO2 23 04/06/2014    Hyperlipidemia:   Currently on Crestor 10 mg 2 times per week.  Following a low fat diet: Yes. He has not tolerated statin meds in the past, he is on CoQ10 300 mg daily    Lab Results  Component Value Date   CHOL 194 06/15/2016   HDL 26 (A) 06/15/2016   LDLCALC 117 06/15/2016   TRIG 255 (A) 06/15/2016    New concerns  today:   -He has lost about 10 lb in 1-2 months because he decreased portions, he has noted decreased appetite.  -Pruritic rash on plantar aspect, exacerbated y sweating, it has been going on for the past 2 years. Gold bond helps some.  Requesting refills on Zofran, which he takes as needed for nausea. This has happened for "a while." Symptoms is exacerbated by certain food intake. Hx of GERD, he is on Protonix 40 mg daily. + Heartburn. Denies abdominal pain, vomiting, changes in bowel habits, blood in stool or melena.   Review of Systems  Constitutional: Negative for activity change, appetite change, fatigue, fever and unexpected weight change.  HENT: Negative for dental problem, mouth sores, nosebleeds, sore throat and trouble swallowing.   Eyes: Negative for redness and visual disturbance.  Respiratory: Negative for cough, shortness of breath and wheezing.   Cardiovascular: Negative for chest pain, palpitations and leg swelling.  Gastrointestinal: Negative for abdominal pain, nausea and vomiting.       No changes in bowel habits.  Endocrine: Negative for polydipsia, polyphagia and polyuria.  Genitourinary: Negative for decreased urine volume, dysuria and hematuria.  Musculoskeletal: Negative for gait problem and myalgias.  Skin: Positive for rash. Negative for wound.  Neurological: Negative for dizziness, syncope, weakness and headaches.  Psychiatric/Behavioral: Negative for confusion and sleep disturbance.  Current Outpatient Prescriptions on File Prior to Visit  Medication Sig Dispense Refill  . aspirin 81 MG tablet Take 81 mg by mouth daily.      . carvedilol (COREG) 6.25 MG tablet TAKE 1 TABLET BY MOUTH TWO  TIMES DAILY 180 tablet 3  . Coenzyme Q10 (CO Q 10 PO) Take 300 mg by mouth daily.    . insulin glargine (LANTUS) 100 UNIT/ML injection Inject 0.35 mLs (35 Units total) into the skin every morning. 30 mL 1  . losartan (COZAAR) 50 MG tablet TAKE 1 TABLET BY MOUTH TWO   TIMES DAILY 180 tablet 3  . ONE TOUCH ULTRA TEST test strip     . pantoprazole (PROTONIX) 40 MG tablet TAKE 1 TABLET BY MOUTH  DAILY 30 tablet 0  . rosuvastatin (CRESTOR) 10 MG tablet Take 1 tablet (10 mg total) by mouth 2 (two) times a week. 10 tablet 11   No current facility-administered medications on file prior to visit.      Past Medical History:  Diagnosis Date  . Arthritis    "back" (04/05/2014)  . Atrial fibrillation ([place])   . Blood in stool   . CAD (coronary artery disease)   . 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 Dr Darcey Nora - to LAD, SVG to diagonal, SVG to OM, SVG to PDA ;  b.  LHC (9/15): ostial LAD occl, ostial CFX 60%, prox AVCFX 95%, RCA 95%, S-PDA occl, S-OM/Dx with Dx limb occl and prox 60%, L-LAD patent, EF 35-40% wtih ant-lat and apical HK-AK >> PCI:  BMS to native RCA   . Diverticulosis   . Dizziness   . DM type 2 causing complication ([place]) 12/18/6293  . GERD (gastroesophageal reflux disease)   . Hypertension   . Iliac artery aneurysm ([place])   . Ischemic cardiomyopathy    a. EF 35-40% by LV gram 03/2014  . Joint pain   . Kidney stones   . Kidney stones   . Lower back pain Jan 2015  . Mixed hyperlipidemia 08/16/2013  . Myocardial infarction ([place]) 03/2014 X 2?  [name] Type II diabetes mellitus ([facility])    Allergies  Allergen Reactions  . Codeine Nausea Only    Cannot take on empty stomach.  . Valium Nausea Only    Cannot take on empty stomach.    Social History   Social History  . Marital status: Married    Spouse name: N/A  . Number of children: N/A  . Years of education: N/A   Social History Main Topics  . Smoking status: Light Tobacco Smoker    Years: 2.00    Types: Cigars  . Smokeless tobacco: Never Used  . Alcohol use No  . Drug use: No  . Sexual activity: Yes   Other Topics Concern  . None   Social History Narrative  . None    Vitals:   12/07/16 1016  BP: 122/80  Pulse: (!) 57  Resp: 12  O2 sat at RA 96% Body mass index is 26.05  kg/m.   Physical Exam  Nursing note and vitals reviewed. Constitutional: He is oriented to person, place, and time. He appears well-developed and well-nourished. No distress.  HENT:  Head: Atraumatic.  Mouth/Throat: Oropharynx is clear and moist and mucous membranes are normal.  Eyes: Conjunctivae and EOM are normal. Pupils are equal, round, and reactive to light.  Cardiovascular: Regular rhythm.  Bradycardia present.   No murmur heard. Pulses:      Dorsalis pedis pulses  are 2+ on the right side, and 2+ on the left side.  Respiratory: Effort normal and breath sounds normal. No respiratory distress.  GI: Soft. He exhibits no mass. There is no hepatomegaly. There is no tenderness.  Musculoskeletal: He exhibits no edema.  Lymphadenopathy:    He has no cervical adenopathy.  Neurological: He is alert and oriented to person, place, and time. He has normal strength. Coordination and gait normal.  Skin: Skin is warm. Rash noted. No erythema.  Crusty micropapular,non erythematous rash on medial plantar. No edema or induration,no tenderness.  Psychiatric: He has a normal mood and affect. Cognition and memory are normal.  Well groomed, good eye contact.   Diabetic foot exam:  Monofilament normal bilateral. Peripheral pulses present (DP). No calluses + hypertrophic/long toenails.   ASSESSMENT AND PLAN:   [name] was seen today for 3-4 months follow-up and the following were addressed:   Type 2 diabetes mellitus with diabetic neuropathy, with long-term current use of insulin ([place])  HgA1C pending today. No changes in current management for now.Instrucetd not to skip Lantus dose and to let me know if he has BS's < 80. In regard to Lantus Rx I explained that I can send Rx with "take as directed" but I do not think it is ethical to write a different dose on Rx. Regular exercise and healthy diet with avoidance of added sugar food intake is an important part of treatment and  recommended. Annual eye exam, periodic dental and foot care recommended. F/U in 5-6 months  -     Hemoglobin A1c -     Basic metabolic panel -     Fructosamine  Essential hypertension  Adequately controlled. No changes in current management. DASH-low salt diet to continue. Eye exam recommended annually. F/U in 6 months, before if needed.  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  He states that in the past he has not afford creams and OTC creams have helped. OTC Clotrimazole cream and Hydrocortisone cream tid as needed.   Hyperlipidemia, mixed  He is not fasting today. Tolerating less frequent statin dose. Low fat to continue. Planning on FLP next OV if he does not have it done at his cardiologists' office.  Nausea without vomiting  This could be related to GERD. Avoid trigger factors. Continue Zofran as needed.   -     ondansetron (ZOFRAN) 4 MG tablet; Take 1 tablet (4 mg total) by mouth daily as needed for nausea or vomiting.  Gastroesophageal reflux disease, esophagitis presence not specified  GERD precautions discussed. No changes in current management.   Bradycardia  Mild. He does not want to decrease Coreg. Instructed about warning signs. Instructed to monitor HR at home.     -[name] was advised to return sooner than planned today if new concerns arise.       Tushar Enns G. [name], MD  [facility]. [place] office.

## 2016-12-07 ENCOUNTER — Ambulatory Visit (INDEPENDENT_AMBULATORY_CARE_PROVIDER_SITE_OTHER): Payer: Medicare Other | Admitting: Family Medicine

## 2016-12-07 ENCOUNTER — Encounter: Payer: Self-pay | Admitting: Family Medicine

## 2016-12-07 VITALS — BP 122/80 | HR 57 | Resp 12 | Ht 69.0 in | Wt 176.4 lb

## 2016-12-07 DIAGNOSIS — L298 Other pruritus: Secondary | ICD-10-CM | POA: Diagnosis not present

## 2016-12-07 DIAGNOSIS — Z794 Long term (current) use of insulin: Secondary | ICD-10-CM | POA: Diagnosis not present

## 2016-12-07 DIAGNOSIS — K219 Gastro-esophageal reflux disease without esophagitis: Secondary | ICD-10-CM

## 2016-12-07 DIAGNOSIS — I10 Essential (primary) hypertension: Secondary | ICD-10-CM

## 2016-12-07 DIAGNOSIS — R110 Nausea: Secondary | ICD-10-CM

## 2016-12-07 DIAGNOSIS — L2989 Other pruritus: Secondary | ICD-10-CM

## 2016-12-07 DIAGNOSIS — E782 Mixed hyperlipidemia: Secondary | ICD-10-CM

## 2016-12-07 DIAGNOSIS — E1140 Type 2 diabetes mellitus with diabetic neuropathy, unspecified: Secondary | ICD-10-CM

## 2016-12-07 DIAGNOSIS — R001 Bradycardia, unspecified: Secondary | ICD-10-CM

## 2016-12-07 LAB — BASIC METABOLIC PANEL
BUN: 15 mg/dL (ref 6–23)
CO2: 30 mEq/L (ref 19–32)
Calcium: 9.2 mg/dL (ref 8.4–10.5)
Chloride: 105 mEq/L (ref 96–112)
Creatinine, Ser: 1.16 mg/dL (ref 0.40–1.50)
GFR: 65.99 mL/min (ref >60.00)
Glucose, Bld: 128 mg/dL — ABNORMAL HIGH (ref 70–99)
Potassium: 4.3 mEq/L (ref 3.5–5.1)
Sodium: 140 mEq/L (ref 135–145)

## 2016-12-07 LAB — HEMOGLOBIN A1C: Hgb A1c MFr Bld: 6.4 % (ref 4.6–6.5)

## 2016-12-07 MED ORDER — ONDANSETRON HCL 4 MG PO TABS: 4.0000 mg | ORAL_TABLET | Freq: Every day | ORAL | 1 refills | Status: DC | PRN

## 2016-12-07 NOTE — Patient Instructions (Signed)
A few things to remember from today's visit:   Type 2 diabetes mellitus with diabetic neuropathy, with long-term current use of insulin ([place]) - Plan: Hemoglobin T6L, Basic metabolic panel, Fructosamine  Hyperlipidemia, mixed  Essential hypertension - Plan: Basic metabolic panel  Chronic pruritic rash in adult  Gastroesophageal reflux disease, esophagitis presence not specified  Nausea without vomiting - Plan: ondansetron (ZOFRAN) 4 MG tablet  Monitor blood pressure and pulse, Coreg can cause low pulse.   Avoid foods that make your symptoms worse, for example coffee, chocolate,pepermeint,alcohol, and greasy food. Raising the head of your bed about 6 inches may help with nocturnal symptoms.  Avoid tobacco use. Weight loss (if you are overweight). Avoid lying down for 3 hours after eating.  Instead 3 large meals daily try small and more frequent meals during the day.   You should be evaluated immediately if bloody vomiting, bloody stools, black stools (like tar), difficulty swallowing, food gets stuck on the way down or choking when eating. Abnormal weight loss or severe abdominal pain.     Please be sure medication list is accurate. If a new problem present, please set up appointment sooner than planned today.

## 2016-12-09 LAB — FRUCTOSAMINE: Fructosamine: 236 umol/L (ref 190–270)

## 2016-12-28 ENCOUNTER — Other Ambulatory Visit (INDEPENDENT_AMBULATORY_CARE_PROVIDER_SITE_OTHER): Payer: Medicare Other

## 2016-12-28 DIAGNOSIS — E782 Mixed hyperlipidemia: Secondary | ICD-10-CM

## 2016-12-28 LAB — LIPID PANEL
Cholesterol: 135 mg/dL (ref 0–200)
HDL: 25.6 mg/dL — ABNORMAL LOW (ref >39.00)
NonHDL: 109.73
Total CHOL/HDL Ratio: 5
Triglycerides: 265 mg/dL — ABNORMAL HIGH (ref 0.0–149.0)
VLDL: 53 mg/dL — ABNORMAL HIGH (ref 0.0–40.0)

## 2016-12-28 LAB — LDL CHOLESTEROL, DIRECT: Direct LDL: 74 mg/dL

## 2017-01-03 ENCOUNTER — Other Ambulatory Visit: Payer: Self-pay

## 2017-01-03 MED ORDER — INSULIN GLARGINE 100 UNIT/ML ~~LOC~~ SOLN: 30.0000 [IU] | SUBCUTANEOUS | 1 refills | Status: DC

## 2017-02-04 ENCOUNTER — Other Ambulatory Visit: Payer: Self-pay

## 2017-02-04 MED ORDER — PANTOPRAZOLE SODIUM 40 MG PO TBEC: 40.0000 mg | DELAYED_RELEASE_TABLET | Freq: Every day | ORAL | 0 refills | Status: DC

## 2017-02-04 MED ORDER — ROSUVASTATIN CALCIUM 10 MG PO TABS: 10.0000 mg | ORAL_TABLET | ORAL | 11 refills | Status: DC

## 2017-02-17 DIAGNOSIS — L720 Epidermal cyst: Secondary | ICD-10-CM | POA: Diagnosis not present

## 2017-02-17 DIAGNOSIS — L821 Other seborrheic keratosis: Secondary | ICD-10-CM | POA: Diagnosis not present

## 2017-02-17 DIAGNOSIS — L820 Inflamed seborrheic keratosis: Secondary | ICD-10-CM | POA: Diagnosis not present

## 2017-02-21 ENCOUNTER — Other Ambulatory Visit: Payer: Self-pay

## 2017-02-21 DIAGNOSIS — R110 Nausea: Secondary | ICD-10-CM

## 2017-02-21 MED ORDER — PEN NEEDLES 31G X 6 MM MISC: 3 refills | Status: DC

## 2017-02-21 MED ORDER — GLUCOSE BLOOD VI STRP: ORAL_STRIP | 3 refills | Status: DC

## 2017-03-10 ENCOUNTER — Other Ambulatory Visit: Payer: Self-pay | Admitting: Cardiovascular Disease

## 2017-03-31 ENCOUNTER — Other Ambulatory Visit: Payer: Self-pay | Admitting: Cardiovascular Disease

## 2017-04-05 ENCOUNTER — Other Ambulatory Visit: Payer: Self-pay

## 2017-04-05 MED ORDER — PANTOPRAZOLE SODIUM 40 MG PO TBEC: 40.0000 mg | DELAYED_RELEASE_TABLET | Freq: Every day | ORAL | 1 refills | Status: DC

## 2017-04-15 ENCOUNTER — Ambulatory Visit (INDEPENDENT_AMBULATORY_CARE_PROVIDER_SITE_OTHER): Payer: Medicare Other

## 2017-04-15 DIAGNOSIS — Z23 Encounter for immunization: Secondary | ICD-10-CM

## 2017-05-17 ENCOUNTER — Telehealth: Payer: Self-pay | Admitting: Cardiovascular Disease

## 2017-05-17 NOTE — Telephone Encounter (Signed)
Has he tried taking NTG? Does he have some? Does the tightness remind him of previous angina? I agree with advice for ED, but if symptoms are indeed mild, he can wait until appt. MCr

## 2017-05-17 NOTE — Telephone Encounter (Signed)
Returned call to pt he states that he is having "chest tightness around bilat shoulders"-around clavicle but not shoulder blades. Denies chest pain or pressure, dizziness, headache, vomiting or sweating.  Pt declines ER states that he will go "if he thinks that he needs to". Pt does not have BP cuff and is "taking all his medications as he should"  Appt made with Lurena Joiner 05-19-17 Ok to wait? Please advise.

## 2017-05-17 NOTE — Telephone Encounter (Signed)
Sign encounter

## 2017-05-17 NOTE — Telephone Encounter (Signed)
Patient calling states that he has some chest tightness around shoulders and neck. [name] states that  it has been going on for a long time and it's not bad just uncomfortable at times. [name] says that he feels like he is choking when he eats. Patient states that this has been going on for a long time.  Patient denies chest pain and is scheduled to see Kerin Ransom on Monday 05-23-17 @ 3:00pm.

## 2017-05-18 NOTE — Telephone Encounter (Signed)
Pt notified he states that he does have NTG, but states that he does not think that it is "bad enough" to take it. Also, he states that this is different that his previous Angina. Pt states that he is unable to come on Thursday re-scheduled to Friday 05-20-17

## 2017-05-19 ENCOUNTER — Ambulatory Visit: Payer: Medicare Other | Admitting: Cardiology

## 2017-05-20 ENCOUNTER — Ambulatory Visit: Payer: Medicare Other | Admitting: Cardiology

## 2017-05-20 ENCOUNTER — Encounter: Payer: Self-pay | Admitting: Cardiology

## 2017-05-20 VITALS — BP 177/84 | HR 52 | Ht 69.0 in | Wt 173.0 lb

## 2017-05-20 DIAGNOSIS — K219 Gastro-esophageal reflux disease without esophagitis: Secondary | ICD-10-CM

## 2017-05-20 DIAGNOSIS — E782 Mixed hyperlipidemia: Secondary | ICD-10-CM | POA: Diagnosis not present

## 2017-05-20 DIAGNOSIS — E1140 Type 2 diabetes mellitus with diabetic neuropathy, unspecified: Secondary | ICD-10-CM | POA: Diagnosis not present

## 2017-05-20 DIAGNOSIS — I6523 Occlusion and stenosis of bilateral carotid arteries: Secondary | ICD-10-CM | POA: Diagnosis not present

## 2017-05-20 DIAGNOSIS — I779 Disorder of arteries and arterioles, unspecified: Secondary | ICD-10-CM | POA: Insufficient documentation

## 2017-05-20 DIAGNOSIS — I10 Essential (primary) hypertension: Secondary | ICD-10-CM | POA: Diagnosis not present

## 2017-05-20 DIAGNOSIS — R079 Chest pain, unspecified: Secondary | ICD-10-CM

## 2017-05-20 DIAGNOSIS — I2510 Atherosclerotic heart disease of native coronary artery without angina pectoris: Secondary | ICD-10-CM | POA: Diagnosis not present

## 2017-05-20 DIAGNOSIS — Z794 Long term (current) use of insulin: Secondary | ICD-10-CM

## 2017-05-20 DIAGNOSIS — I739 Peripheral vascular disease, unspecified: Secondary | ICD-10-CM

## 2017-05-20 MED ORDER — NITROGLYCERIN 0.4 MG SL SUBL: 0.4000 mg | SUBLINGUAL_TABLET | SUBLINGUAL | 3 refills | Status: DC | PRN

## 2017-05-20 MED ORDER — ISOSORBIDE MONONITRATE ER 30 MG PO TB24: 15.0000 mg | ORAL_TABLET | Freq: Every day | ORAL | 5 refills | Status: DC

## 2017-05-20 NOTE — Assessment & Plan Note (Signed)
Followed by PCP- pt notes 20 lb wgt loss over past 6 months

## 2017-05-20 NOTE — Progress Notes (Signed)
05/20/2017 [name]   22-Jul-1945  [id number]  Primary Physician [name], [name], MD Primary Cardiologist: Dr Sallyanne Kuster  HPI:  71 y/o male with a history of CAD, s/p CABG 2006, followed by native RCA PCI 2015. His EF in 2015 was depressed, 35-40% but this was improved by echo jan 2016- 55-60%. Other problems include IDDM, HTN, and HLD with statin intolerance. He is on the office today with complaints of exertional chest tightness. He is not a complainer. His wife accompanied him. He says he has been having chest tightness with exertion (yardwork) for months. No associated nausea, diaphoresis, or SOB. Some radiation to his neck. He has not taken NTG for this. He tends to minimize his symptoms but his wife says he is having more symptoms than he is telling us. He does admit his symptoms are similar to his pre PCI symptoms.    Current Outpatient Medications  Medication Sig Dispense Refill  . aspirin 81 MG tablet Take 81 mg by mouth daily.      . carvedilol (COREG) 6.25 MG tablet TAKE 1 TABLET BY MOUTH TWO  TIMES DAILY 180 tablet 2  . insulin glargine (LANTUS) 100 UNIT/ML injection Inject 0.3 mLs (30 Units total) into the skin every morning. 30 mL 1  . losartan (COZAAR) 50 MG tablet TAKE 1 TABLET BY MOUTH TWO  TIMES DAILY 180 tablet 2  . pantoprazole (PROTONIX) 40 MG tablet Take 1 tablet (40 mg total) by mouth daily. 90 tablet 1  . isosorbide mononitrate (IMDUR) 30 MG 24 hr tablet Take 0.5 tablets (15 mg total) daily by mouth. 1/2 TABLET X2 DAYS THEN INCREASE TO WHOLE TABLET 30 tablet 5  . nitroGLYCERIN (NITROSTAT) 0.4 MG SL tablet Place 1 tablet (0.4 mg total) every 5 (five) minutes as needed under the tongue for chest pain. 90 tablet 3  . rosuvastatin (CRESTOR) 10 MG tablet Take 1 tablet (10 mg total) by mouth 2 (two) times a week. 10 tablet 11   No current facility-administered medications for this visit.     Allergies  Allergen Reactions  . Codeine Nausea Only    Cannot take on  empty stomach.  . Valium Nausea Only    Cannot take on empty stomach.    Past Medical History:  Diagnosis Date  . Arthritis    "back" (04/05/2014)  . Atrial fibrillation ([place])   . Blood in stool   . CAD (coronary artery disease)   . 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 Dr Darcey Nora - to LAD, SVG to diagonal, SVG to OM, SVG to PDA ;  b.  LHC (9/15): ostial LAD occl, ostial CFX 60%, prox AVCFX 95%, RCA 95%, S-PDA occl, S-OM/Dx with Dx limb occl and prox 60%, L-LAD patent, EF 35-40% wtih ant-lat and apical HK-AK >> PCI:  BMS to native RCA   . Diverticulosis   . Dizziness   . DM type 2 causing complication ([place]) 10/17/5460  . GERD (gastroesophageal reflux disease)   . Hypertension   . Iliac artery aneurysm ([place])   . Ischemic cardiomyopathy    a. EF 35-40% by LV gram 03/2014  . Joint pain   . Kidney stones   . Kidney stones   . Lower back pain Jan 2015  . Mixed hyperlipidemia 08/16/2013  . Myocardial infarction ([place]) 03/2014 X 2?  [name] Type II diabetes mellitus ([place])     Social History   Socioeconomic History  . Marital status: Married    Spouse name:  Not on file  . Number of children: Not on file  . Years of education: Not on file  . Highest education level: Not on file  Social Needs  . Financial resource strain: Not on file  . Food insecurity - worry: Not on file  . Food insecurity - inability: Not on file  . Transportation needs - medical: Not on file  . Transportation needs - non-medical: Not on file  Occupational History  . Not on file  Tobacco Use  . Smoking status: Light Tobacco Smoker    Years: 2.00    Types: Cigars  . Smokeless tobacco: Never Used  Substance and Sexual Activity  . Alcohol use: No  . Drug use: No  . Sexual activity: Yes  Other Topics Concern  . Not on file  Social History Narrative  . Not on file     Family History  Problem Relation Age of Onset  . Diabetes Mother   . Hypertension Mother   . Heart disease Mother        AAA  . Heart disease  Sister   . Heart disease Son        Heart Disease before age 21  . Diabetes Brother      Review of Systems: General: negative for chills, fever, night sweats 20 lb unintentional wgt loss over past 6 months Colonoscopy last year WNL Cardiovascular: negative for chest pain, dyspnea on exertion, edema, orthopnea, palpitations, paroxysmal nocturnal dyspnea or shortness of breath Dermatological: negative for rash Respiratory: negative for cough or wheezing Urologic: negative for hematuria Abdominal: negative for nausea, vomiting, diarrhea, bright red blood per rectum, melena, or hematemesis Neurologic: negative for visual changes, syncope, or dizziness All other systems reviewed and are otherwise negative except as noted above.    Blood pressure (!) 177/84, pulse (!) 52, height 5\' 9"  (1.753 m), weight 173 lb (78.5 kg).  General appearance: alert, cooperative and no distress Neck: no carotid bruit and no JVD Lungs: clear to auscultation bilaterally Heart: regular rate and rhythm Extremities: extremities normal, atraumatic, no cyanosis or edema Skin: Skin color, texture, turgor normal. No rashes or lesions Neurologic: Grossly normal  EKG NSR, SB-52  ASSESSMENT AND PLAN:   Chest pain with moderate risk for cardiac etiology Pt seen today with complaints of "tightness" when doing yardwork  CAD s/p CABG 2006 Dr vanTrigt - LIMA to LAD, SVG to diagonal, SVG to OM, SVG to PDA  Cath Dec 2015 - LIMA to LAD patent, SVG to PDA occluded,  diagonal limb of sequential SVG to OM and Dx 60%; very large dominant native RCA. Treated with native RCA BMS   Hyperlipidemia, mixed Tolerating Crestor twice a week  Carotid artery disease (HCC) Mild disease 1-39% bilateral stenosis by doppler 2012  Type 2 diabetes mellitus with diabetic neuropathy, unspecified ([place]) Followed by PCP- pt notes 20 lb wgt loss over past 6 months  Hypertension Repeat B/P by me 128/72   PLAN  I suggested we add Imdur  30 mg and get a GXT Myoview. I also told him to f/u with his PCP about the wgt loss.   Kerin Ransom PA-C 05/20/2017 10:07 AM

## 2017-05-20 NOTE — Assessment & Plan Note (Signed)
Mild disease 1-39% bilateral stenosis by doppler 2012

## 2017-05-20 NOTE — Assessment & Plan Note (Signed)
Dr vanTrigt - LIMA to LAD, SVG to diagonal, SVG to OM, SVG to PDA  Cath Dec 2015 - LIMA to LAD patent, SVG to PDA occluded,  diagonal limb of sequential SVG to OM and Dx 60%; very large dominant native RCA. Treated with native RCA BMS

## 2017-05-20 NOTE — Assessment & Plan Note (Signed)
Tolerating Crestor twice a week

## 2017-05-20 NOTE — Assessment & Plan Note (Signed)
Repeat B/P by me 128/72

## 2017-05-20 NOTE — Assessment & Plan Note (Signed)
Pt seen today with complaints of "tightness" when doing yardwork

## 2017-05-20 NOTE — Patient Instructions (Addendum)
Medication Instructions:  START ISOSORBIDE 15MG  (1/2 TABLET) FOR 2 DAYS THEN INCREASE TO WHOLE TABLET  If you need a refill on your cardiac medications before your next appointment, please call your pharmacy.  Testing/Procedures: Your physician has requested that you have a exercise myoview. For further information please visit [email or web address]. Please follow instruction sheet, as given.  Follow-Up: Your physician wants you to follow-up in: February 2019 WITH DR CROITORU. You should receive a reminder letter in the mail two months in advance. If you do not receive a letter, please call our office December 2018 to schedule the February 2019 follow-up appointment.   Thank you for choosing [facility] at [facility]!!

## 2017-05-23 ENCOUNTER — Ambulatory Visit: Payer: Medicare Other | Admitting: Cardiology

## 2017-05-24 NOTE — Addendum Note (Signed)
Addended by: Milderd Meager on: 05/24/2017 11:38 AM   Modules accepted: Orders

## 2017-05-27 ENCOUNTER — Telehealth (HOSPITAL_COMMUNITY): Payer: Self-pay

## 2017-05-27 NOTE — Telephone Encounter (Signed)
Encounter complete. 

## 2017-05-31 ENCOUNTER — Ambulatory Visit (HOSPITAL_COMMUNITY)
Admission: RE | Admit: 2017-05-31 | Discharge: 2017-05-31 | Disposition: A | Discharge status: Home / Self Care | Payer: Medicare Other | Source: Ambulatory Visit | Attending: Cardiology | Admitting: Cardiology

## 2017-05-31 DIAGNOSIS — I6523 Occlusion and stenosis of bilateral carotid arteries: Secondary | ICD-10-CM | POA: Diagnosis not present

## 2017-05-31 DIAGNOSIS — K219 Gastro-esophageal reflux disease without esophagitis: Secondary | ICD-10-CM | POA: Insufficient documentation

## 2017-05-31 DIAGNOSIS — R079 Chest pain, unspecified: Secondary | ICD-10-CM | POA: Insufficient documentation

## 2017-05-31 DIAGNOSIS — Z794 Long term (current) use of insulin: Secondary | ICD-10-CM | POA: Insufficient documentation

## 2017-05-31 DIAGNOSIS — I10 Essential (primary) hypertension: Secondary | ICD-10-CM | POA: Diagnosis not present

## 2017-05-31 DIAGNOSIS — E782 Mixed hyperlipidemia: Secondary | ICD-10-CM | POA: Insufficient documentation

## 2017-05-31 DIAGNOSIS — E1140 Type 2 diabetes mellitus with diabetic neuropathy, unspecified: Secondary | ICD-10-CM | POA: Insufficient documentation

## 2017-05-31 DIAGNOSIS — I2510 Atherosclerotic heart disease of native coronary artery without angina pectoris: Secondary | ICD-10-CM | POA: Diagnosis not present

## 2017-05-31 LAB — MYOCARDIAL PERFUSION IMAGING
LV dias vol: 139 mL (ref 62–150)
LV sys vol: 71 mL
Peak HR: 88 {beats}/min
Rest HR: 65 {beats}/min
SDS: 10
SRS: 1
SSS: 11
TID: 1.01

## 2017-05-31 MED ORDER — REGADENOSON 0.4 MG/5ML IV SOLN
0.4000 mg | Freq: Once | INTRAVENOUS | Status: AC
  Administered 2017-05-31: 0.4 mg via INTRAVENOUS

## 2017-05-31 MED ORDER — TECHNETIUM TC 99M TETROFOSMIN IV KIT
28.1000 | PACK | Freq: Once | INTRAVENOUS | Status: AC | PRN
  Administered 2017-05-31: 28.1 via INTRAVENOUS
  Filled 2017-05-31: qty 29

## 2017-05-31 MED ORDER — TECHNETIUM TC 99M TETROFOSMIN IV KIT
10.1000 | PACK | Freq: Once | INTRAVENOUS | Status: AC | PRN
  Administered 2017-05-31: 10.1 via INTRAVENOUS
  Filled 2017-05-31: qty 11

## 2017-06-01 ENCOUNTER — Ambulatory Visit: Payer: Self-pay

## 2017-06-01 ENCOUNTER — Telehealth: Payer: Self-pay | Admitting: *Deleted

## 2017-06-01 NOTE — Telephone Encounter (Signed)
I called and spoke to patient. Relayed instructions. He agreed to plan and voiced understanding to increase med to recommended dose if needed and/or call if concerns.

## 2017-06-01 NOTE — Telephone Encounter (Signed)
Reason for Disposition . Abdominal pain is a chronic symptom (recurrent or ongoing AND present > 4 weeks)  Answer Assessment - Initial Assessment Questions 1. LOCATION: "Where does it hurt?"      Upper mid abdomen with increased belching and burping since summer time, and burning into throat 2. RADIATION: "Does the pain shoot anywhere else?" (e.g., chest, back)     no 3. ONSET: "When did the pain begin?" (e.g., minutes, hours or days ago)      Denies actual pain; describes as discomfort; "feels like I'm bloated; steadily there 4. SUDDEN: "Gradual or sudden onset?"     N/a; discomfort is steadily there 5. PATTERN "Does the pain come and go, or is it constant?"    - If constant: "Is it getting better, staying the same, or worsening?"      (Note: Constant means the pain never goes away completely; most serious pain is constant and it progresses)     - If intermittent: "How long does it last?" "Do you have pain now?"     (Note: Intermittent means the pain goes away completely between bouts)     constant 6. SEVERITY: "How bad is the pain?"  (e.g., Scale 1-10; mild, moderate, or severe)    - MILD (1-3): doesn't interfere with normal activities, abdomen soft and not tender to touch     - MODERATE (4-7): interferes with normal activities or awakens from sleep, tender to touch     - SEVERE (8-10): excruciating pain, doubled over, unable to do any normal activities       3/10 7. RECURRENT SYMPTOM: "Have you ever had this type of abdominal pain before?" If so, ask: "When was the last time?" and "What happened that time?"      Ongoing since summertime 8. AGGRAVATING FACTORS: "Does anything seem to cause this pain?" (e.g., foods, stress, alcohol)     Greasy foods, stress; denies any alcohol intake  9. CARDIAC SYMPTOMS: "Do you have any of the following symptoms: chest pain, difficulty breathing, sweating, nausea?"     Recent stress test and advised heart is stable 10. OTHER SYMPTOMS: "Do you have any  other symptoms?" (e.g., fever, vomiting, diarrhea)       C/o intermittent nausea; Denies vomiting 11. PREGNANCY: "Is there any chance you are pregnant?" "When was your last menstrual period?"       n/a  Protocols used: ABDOMINAL PAIN - UPPER-A-AH

## 2017-06-01 NOTE — Telephone Encounter (Signed)
Pt. called and c/o continuous discomfort in mid, upper stomach.  Described discomfort as "feels like something is pressing in my upper stomach."  Stated that he has shortness of breath, at times, because of the pressure.  Reported he has been evaluated by Cardiology, and told his heart is stable.  Reported he doesn't have an appetite, and has lost about 20 lbs. since July.  C/o having a lot of belching, burping and burning in his upper chest and throat.  Verbalized that greasy foods and stress will aggravate the discomfort.  C/o intermittent nausea; denies vomiting.  Stated "I don't know if I have a hernia or something wrong with my lungs."  Reported he has an appt. next Friday with Dr. [name] for above symptoms.  Care advice per protocol.  Pt. verb. understanding.

## 2017-06-01 NOTE — Telephone Encounter (Signed)
-----   Message from Erlene Quan, [place] sent at 06/01/2017  7:46 AM EST ----- Please contact this patient and let him know I spoke with Dr Sallyanne Kuster- the plan is to treat him medically. I added Imdur at the last office visit. If he is still having chest pain he should increase that to 60 mg daily. Call if that does not help and we'll try something else.  Kerin Ransom PA-C 06/01/2017 7:47 AM

## 2017-06-10 ENCOUNTER — Encounter: Payer: Self-pay | Admitting: Family Medicine

## 2017-06-10 ENCOUNTER — Ambulatory Visit (INDEPENDENT_AMBULATORY_CARE_PROVIDER_SITE_OTHER): Payer: Medicare Other | Admitting: Family Medicine

## 2017-06-10 VITALS — BP 140/88 | HR 61 | Temp 97.8°F | Resp 12 | Ht 69.0 in | Wt 175.4 lb

## 2017-06-10 DIAGNOSIS — R1310 Dysphagia, unspecified: Secondary | ICD-10-CM

## 2017-06-10 DIAGNOSIS — Z794 Long term (current) use of insulin: Secondary | ICD-10-CM | POA: Diagnosis not present

## 2017-06-10 DIAGNOSIS — I10 Essential (primary) hypertension: Secondary | ICD-10-CM

## 2017-06-10 DIAGNOSIS — E1140 Type 2 diabetes mellitus with diabetic neuropathy, unspecified: Secondary | ICD-10-CM | POA: Diagnosis not present

## 2017-06-10 DIAGNOSIS — K219 Gastro-esophageal reflux disease without esophagitis: Secondary | ICD-10-CM

## 2017-06-10 LAB — BASIC METABOLIC PANEL
BUN: 17 mg/dL (ref 6–23)
CO2: 30 mEq/L (ref 19–32)
Calcium: 9.3 mg/dL (ref 8.4–10.5)
Chloride: 104 mEq/L (ref 96–112)
Creatinine, Ser: 1.12 mg/dL (ref 0.40–1.50)
GFR: 68.62 mL/min (ref >60.00)
Glucose, Bld: 113 mg/dL — ABNORMAL HIGH (ref 70–99)
Potassium: 4.1 mEq/L (ref 3.5–5.1)
Sodium: 140 mEq/L (ref 135–145)

## 2017-06-10 LAB — HEMOGLOBIN A1C: Hgb A1c MFr Bld: 6.3 % (ref 4.6–6.5)

## 2017-06-10 NOTE — Progress Notes (Signed)
ACUTE VISIT   HPI:  Chief Complaint  Patient presents with  . Abdominal Pain    upper abdominal discomfort with eating for about 4 months    [name] is a 71 y.o. male, who is here today with his wife, he is concerned about possible "hernia." Since July 2018 he has had some epigastric "discomfort", "no pain", intermittently, exacerbated by certain food intake, including fried food and chicken wings with sauce.  According to patient, she follow with his cardiologist and already had a stress test (05/31/17) that was negative. He tells me that it was mentioned to him that it could be a "hernia" causing symptoms.  He has history of GERD, currently he is on Protonix 40 mg daily. He does not recall having a EGD. In general symptoms have improved.  He has frequent burping, nausea, and heartburn.  He has not identified alleviating factors, states that sometimes he has symptoms not related to food intake. Occasional dysphagia for solids.  He denies fever, chills, cough, dysphonia,vomiting, blood in the stool, or melena.  + Smoker.  Hypertension: He mentions that during stress test his BP was "very high." He is not checking BP at home. He follows low-salt diet. Last eye exam over a year ago. He is currently on Losartan 50 mg daily, Coreg 6.25 mg twice daily, and Imdur 30 mg daily.  Denies severe/frequent headache, visual changes, chest pain, dyspnea, palpitation, claudication, focal weakness, or edema.   Diabetes Mellitus II:   Chronic. Currently on Lantus 30 units daily. Eye exam over a year ago. Checking BS's : 100-130's. Hypoglycemia:Denies  He is tolerating medications well. He denies polydipsia, polyuria, or polyphagia.   Lab Results  Component Value Date   CREATININE 1.16 12/07/2016   BUN 15 12/07/2016   NA 140 12/07/2016   K 4.3 12/07/2016   CL 105 12/07/2016   CO2 30 12/07/2016    Lab Results  Component Value Date   HGBA1C 6.4 12/07/2016     Lab Results  Component Value Date   MICROALBUR 1.6 08/16/2016    Review of Systems  Constitutional: Negative for activity change, appetite change, fatigue and fever.  HENT: Positive for trouble swallowing. Negative for mouth sores, nosebleeds and sore throat.   Eyes: Negative for redness and visual disturbance.  Respiratory: Negative for cough, shortness of breath and wheezing.   Cardiovascular: Negative for chest pain, palpitations and leg swelling.  Gastrointestinal: Positive for nausea. Negative for abdominal pain, blood in stool and vomiting.       No changes in bowel habits.  Endocrine: Negative for cold intolerance, heat intolerance, polydipsia, polyphagia and polyuria.  Genitourinary: Negative for decreased urine volume, dysuria and hematuria.  Musculoskeletal: Negative for gait problem and myalgias.  Skin: Negative for rash and wound.  Neurological: Negative for syncope, weakness, numbness and headaches.  Psychiatric/Behavioral: Negative for confusion. The patient is nervous/anxious.       Current Outpatient Medications on File Prior to Visit  Medication Sig Dispense Refill  . aspirin 81 MG tablet Take 81 mg by mouth daily.      . carvedilol (COREG) 6.25 MG tablet TAKE 1 TABLET BY MOUTH TWO  TIMES DAILY 180 tablet 2  . insulin glargine (LANTUS) 100 UNIT/ML injection Inject 0.3 mLs (30 Units total) into the skin every morning. 30 mL 1  . isosorbide mononitrate (IMDUR) 30 MG 24 hr tablet Take 0.5 tablets (15 mg total) daily by mouth. 1/2 TABLET X2 DAYS THEN INCREASE TO  WHOLE TABLET 30 tablet 5  . losartan (COZAAR) 50 MG tablet TAKE 1 TABLET BY MOUTH TWO  TIMES DAILY 180 tablet 2  . nitroGLYCERIN (NITROSTAT) 0.4 MG SL tablet Place 1 tablet (0.4 mg total) every 5 (five) minutes as needed under the tongue for chest pain. 90 tablet 3  . pantoprazole (PROTONIX) 40 MG tablet Take 1 tablet (40 mg total) by mouth daily. 90 tablet 1  . rosuvastatin (CRESTOR) 10 MG tablet Take 1  tablet (10 mg total) by mouth 2 (two) times a week. 10 tablet 11   No current facility-administered medications on file prior to visit.      Past Medical History:  Diagnosis Date  . Arthritis    "back" (04/05/2014)  . Atrial fibrillation ([place])   . Blood in stool   . CAD (coronary artery disease)   . 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 Dr Darcey Nora - to LAD, SVG to diagonal, SVG to OM, SVG to PDA ;  b.  LHC (9/15): ostial LAD occl, ostial CFX 60%, prox AVCFX 95%, RCA 95%, S-PDA occl, S-OM/Dx with Dx limb occl and prox 60%, L-LAD patent, EF 35-40% wtih ant-lat and apical HK-AK >> PCI:  BMS to native RCA   . Diverticulosis   . Dizziness   . DM type 2 causing complication ([place]) 5/0/9326  . GERD (gastroesophageal reflux disease)   . Hypertension   . Iliac artery aneurysm ([place])   . Ischemic cardiomyopathy    a. EF 35-40% by LV gram 03/2014  . Joint pain   . Kidney stones   . Kidney stones   . Lower back pain Jan 2015  . Mixed hyperlipidemia 08/16/2013  . Myocardial infarction ([place]) 03/2014 X 2?  [name] Type II diabetes mellitus (HCC)    Allergies  Allergen Reactions  . Codeine Nausea Only    Cannot take on empty stomach.  . Valium Nausea Only    Cannot take on empty stomach.    Social History   Socioeconomic History  . Marital status: Married    Spouse name: None  . Number of children: None  . Years of education: None  . Highest education level: None  Social Needs  . Financial resource strain: None  . Food insecurity - worry: None  . Food insecurity - inability: None  . Transportation needs - medical: None  . Transportation needs - non-medical: None  Occupational History  . None  Tobacco Use  . Smoking status: Light Tobacco Smoker    Years: 2.00    Types: Cigars  . Smokeless tobacco: Never Used  Substance and Sexual Activity  . Alcohol use: No  . Drug use: No  . Sexual activity: Yes  Other Topics Concern  . None  Social History Narrative  . None    Vitals:   06/10/17  1129  BP: 140/88  Pulse: 61  Resp: 12  Temp: 97.8 F (36.6 C)  SpO2: 98%   Body mass index is 25.9 kg/m.  Physical Exam  Nursing note and vitals reviewed. Constitutional: He is oriented to person, place, and time. He appears well-developed and well-nourished. No distress.  HENT:  Head: Normocephalic and atraumatic.  Mouth/Throat: Oropharynx is clear and moist and mucous membranes are normal.  Eyes: Conjunctivae are normal. Pupils are equal, round, and reactive to light.  Cardiovascular: Normal rate and regular rhythm.  No murmur heard. Pulses:      Dorsalis pedis pulses are 2+ on the right side, and 2+ on the  left side.  Respiratory: Effort normal and breath sounds normal. No respiratory distress.  GI: Soft. He exhibits no mass. There is no hepatomegaly. There is no tenderness.  Musculoskeletal: He exhibits no edema.  Lymphadenopathy:    He has no cervical adenopathy.  Neurological: He is alert and oriented to person, place, and time. He has normal strength. Gait normal.  Skin: Skin is warm. No erythema.  Psychiatric: His mood appears anxious. Cognition and memory are normal.  Well groomed, good eye contact.   Foot exam 11/2016.  ASSESSMENT AND PLAN:   [name] was seen today for abdominal pain and follow-up.  Diagnoses and all orders for this visit:  Lab Results  Component Value Date   HGBA1C 6.3 06/10/2017   Lab Results  Component Value Date   CREATININE 1.12 06/10/2017   BUN 17 06/10/2017   NA 140 06/10/2017   K 4.1 06/10/2017   CL 104 06/10/2017   CO2 30 06/10/2017    Gastroesophageal reflux disease, esophagitis presence not specified  This problem could explained some of his symptoms. ? Hiatal hernia. GERD precautions discussed, avoid trigger factors already identified. Continue Protonix.  Dysphagia, unspecified type  Possible etiologies discussed: Esophagitis/GERD.  + Tobacco use. Recommend GI evaluation, may need a EGD to evaluate for other  causes. His wife does not think referral to see Dr Watt Climes is necessary, he will let me know if he does.   Type 2 diabetes mellitus with diabetic neuropathy, with long-term current use of insulin ([place])  HgA1C has been at goal. No changes in current management, will adjust Lantus according to HgA1C results.  Regular exercise and healthy diet with avoidance of added sugar food intake is an important part of treatment and recommended. Annual eye exam, periodic dental and foot care recommended. F/U in 5-6 months  -     Hemoglobin A1c  Essential hypertension  Slightly elevated. No changes in current management, instructed to monitor BP at home. DASH diet recommended. F/U in 6 months, before if needed.  -     Basic metabolic panel    Return in about 5 months (around 11/08/2017) for DM,HTN,.     Scott Rog G. [name], MD  [facility]. [place] office.

## 2017-06-10 NOTE — Patient Instructions (Addendum)
A few things to remember from today's visit:   Gastroesophageal reflux disease, esophagitis presence not specified  Essential hypertension - Plan: Basic metabolic panel  Type 2 diabetes mellitus with diabetic neuropathy, with long-term current use of insulin ([place]) - Plan: Hemoglobin A1c  Dysphagia, unspecified type  Dysphagia Dysphagia is trouble swallowing. This condition occurs when solids and liquids stick in a person's throat on the way down to the stomach, or when food takes longer to get to the stomach. You may have problems swallowing food, liquids, or both. You may also have pain while trying to swallow. It may take you more time and effort to swallow something. What are the causes? This condition is caused by:  Problems with the muscles. They may make it difficult for you to move food and liquids through the tube that connects your mouth to your stomach (esophagus). You may have ulcers, scar tissue, or inflammation that blocks the normal passage of food and liquids. Causes of these problems include: ? Acid reflux from your stomach into your esophagus (gastroesophageal reflux). ? Infections. ? Radiation treatment for cancer. ? Medicines taken without enough fluids to wash them down into your stomach.  Nerve problems. These prevent signals from being sent to the muscles of your esophagus to squeeze (contract) and move what you swallow down to your stomach.  Globus pharyngeus. This is a common problem that involves feeling like something is stuck in the throat or a sense of trouble with swallowing even though nothing is wrong with the swallowing passages.  Stroke. This can affect the nerves and make it difficult to swallow.  Certain conditions, such as cerebral palsy or Parkinson disease.  What are the signs or symptoms? Common symptoms of this condition include:  A feeling that solids or liquids are stuck in your throat on the way down to the stomach.  Food taking too long  to get to the stomach.  Other symptoms include:  Food moving back from your stomach to your mouth (regurgitation).  Noises coming from your throat.  Chest discomfort with swallowing.  A feeling of fullness when swallowing.  Drooling, especially when the throat is blocked.  Pain while swallowing.  Heartburn.  Coughing or gagging while trying to swallow.  How is this diagnosed? This condition is diagnosed by:  Barium X-ray. In this test, you swallow a white substance (contrast medium)that sticks to the inside of your esophagus. X-ray images are then taken.  Endoscopy. In this test, a flexible telescope is inserted down your throat to look at your esophagus and your stomach.  CT scans and MRI.  How is this treated? Treatment for dysphagia depends on the cause of the condition:  If the dysphagia is caused by acid reflux or infection, medicines may be used. They may include antibiotics and heartburn medicines.  If the dysphagia is caused by problems with your muscles, swallowing therapy may be used to help you strengthen your swallowing muscles. You may have to do specific exercises to strengthen the muscles or stretch them.  If the dysphagia is caused by a blockage or mass, procedures to remove the blockage may be done. You may need surgery and a feeding tube.  You may need to make diet changes. Ask your health care provider for specific instructions. Follow these instructions at home: Eating and drinking  Try to eat soft food that is easier to swallow.  Follow any diet changes as told by your health care provider.  Cut your food into small pieces  and eat slowly.  Eat and drink only when you are sitting upright.  Do not drink alcohol or caffeine. If you need help quitting, ask your health care provider. General instructions  Check your weight every day to make sure you are not losing weight.  Take over-the-counter and prescription medicines only as told by your  health care provider.  If you were prescribed an antibiotic medicine, take it as told by your health care provider. Do not stop taking the antibiotic even if you start to feel better.  Do not use any products that contain nicotine or tobacco, such as cigarettes and e-cigarettes. If you need help quitting, ask your health care provider.  Keep all follow-up visits as told by your health care provider. This is important. Contact a health care provider if:  You lose weight because you cannot swallow.  You cough when you drink liquids (aspiration).  You cough up partially digested food. Get help right away if:  You cannot swallow your saliva.  You have shortness of breath or a fever, or both.  You have a hoarse voice and also have trouble swallowing. Summary  Dysphagia is trouble swallowing. This condition occurs when solids and liquids stick in a person's throat on the way down to the stomach, or when food takes longer to get to the stomach.  Dysphagia has many possible causes and symptoms.  Treatment for dysphagia depends on the cause of the condition. This information is not intended to replace advice given to you by your health care provider. Make sure you discuss any questions you have with your health care provider. Document Released: 06/25/2000 Document Revised: 06/17/2016 Document Reviewed: 06/17/2016 Elsevier Interactive Patient Education  2017 Elsevier Inc.  GI evaluation, let me know if a referal is needed to see Dr Watt Climes.  Monitor blood pressure at home.  GERD:  Avoid foods that make your symptoms worse, for example coffee, chocolate,pepermeint,alcohol, and greasy food. Raising the head of your bed about 6 inches may help with nocturnal symptoms.  Avoid tobacco use. Weight loss (if you are overweight). Avoid lying down for 3 hours after eating.  Instead 3 large meals daily try small and more frequent meals during the day.    You should be evaluated immediately  if bloody vomiting, bloody stools, black stools (like tar), difficulty swallowing, food gets stuck on the way down or choking when eating. Abnormal weight loss or severe abdominal pain.  If symptoms are not resolved sometimes endoscopy is necessary.  Please be sure medication list is accurate. If a new problem present, please set up appointment sooner than planned today.

## 2017-06-17 ENCOUNTER — Telehealth: Payer: Self-pay | Admitting: Family Medicine

## 2017-06-17 NOTE — Telephone Encounter (Signed)
Copied from [place] [id number]. Topic: Quick Communication - Rx Refill/Question >> Jun 17, 2017  3:55 PM Arletha Grippe wrote: Has the patient contacted their pharmacy? Yes.     (Agent: If no, request that the patient contact the pharmacy for the refill.)   Preferred Pharmacy (with phone number or street name): test strip one touch ultra needs to be called in - in order for it to be covered. Pt goes to [facility] - [phone]. Pt cb number is [phone] pharmacy closes at 6 today and tomorrow at 2    Agent: Please be advised that RX refills may take up to 3 business days. We ask that you follow-up with your pharmacy.

## 2017-06-20 NOTE — Telephone Encounter (Signed)
Pt  Requesting  An order  For   Test  Strips  One  Touch   Ultra   To  [facility]  Pt cb number  Is  [phone]

## 2017-06-20 NOTE — Telephone Encounter (Signed)
Strips are not on current med list. Ok to send?

## 2017-06-21 NOTE — Telephone Encounter (Signed)
Hx of DM II, so it is Ok to sent Rx for test strips as requested by patient.  BJ

## 2017-06-22 MED ORDER — GLUCOSE BLOOD VI STRP: ORAL_STRIP | 5 refills | Status: DC

## 2017-06-22 NOTE — Telephone Encounter (Signed)
Pt called to inform the practice that the drug store is waiting for the approval of the Rx

## 2017-06-22 NOTE — Telephone Encounter (Signed)
Medication faxed to pharmacy as requested. Fax confirmation received.

## 2017-06-22 NOTE — Addendum Note (Signed)
Addended by: Dorrene German on: 06/22/2017 12:18 PM   Modules accepted: Orders

## 2017-06-24 ENCOUNTER — Other Ambulatory Visit: Payer: Self-pay | Admitting: *Deleted

## 2017-06-24 MED ORDER — GLUCOSE BLOOD VI STRP: ORAL_STRIP | 5 refills | Status: DC

## 2017-09-01 ENCOUNTER — Other Ambulatory Visit: Payer: Self-pay | Admitting: Family Medicine

## 2017-09-05 ENCOUNTER — Encounter: Payer: Self-pay | Admitting: Cardiovascular Disease

## 2017-09-05 ENCOUNTER — Other Ambulatory Visit: Payer: Self-pay | Admitting: Cardiovascular Disease

## 2017-09-05 ENCOUNTER — Ambulatory Visit: Payer: Medicare Other | Admitting: Cardiovascular Disease

## 2017-09-05 VITALS — BP 178/82 | HR 64 | Ht 69.0 in | Wt 174.4 lb

## 2017-09-05 DIAGNOSIS — Z01812 Encounter for preprocedural laboratory examination: Secondary | ICD-10-CM | POA: Diagnosis not present

## 2017-09-05 DIAGNOSIS — E119 Type 2 diabetes mellitus without complications: Secondary | ICD-10-CM | POA: Diagnosis not present

## 2017-09-05 DIAGNOSIS — I10 Essential (primary) hypertension: Secondary | ICD-10-CM

## 2017-09-05 DIAGNOSIS — R9431 Abnormal electrocardiogram [ECG] [EKG]: Secondary | ICD-10-CM

## 2017-09-05 DIAGNOSIS — I5032 Chronic diastolic (congestive) heart failure: Secondary | ICD-10-CM | POA: Diagnosis not present

## 2017-09-05 DIAGNOSIS — I2510 Atherosclerotic heart disease of native coronary artery without angina pectoris: Secondary | ICD-10-CM | POA: Diagnosis not present

## 2017-09-05 DIAGNOSIS — T50905A Adverse effect of unspecified drugs, medicaments and biological substances, initial encounter: Secondary | ICD-10-CM

## 2017-09-05 DIAGNOSIS — R079 Chest pain, unspecified: Secondary | ICD-10-CM | POA: Diagnosis not present

## 2017-09-05 DIAGNOSIS — I25110 Atherosclerotic heart disease of native coronary artery with unstable angina pectoris: Secondary | ICD-10-CM | POA: Diagnosis not present

## 2017-09-05 DIAGNOSIS — R001 Bradycardia, unspecified: Secondary | ICD-10-CM | POA: Diagnosis not present

## 2017-09-05 DIAGNOSIS — I739 Peripheral vascular disease, unspecified: Secondary | ICD-10-CM

## 2017-09-05 DIAGNOSIS — E7800 Pure hypercholesterolemia, unspecified: Secondary | ICD-10-CM

## 2017-09-05 DIAGNOSIS — I200 Unstable angina: Secondary | ICD-10-CM

## 2017-09-05 MED ORDER — AMLODIPINE BESYLATE 5 MG PO TABS: 5.0000 mg | ORAL_TABLET | Freq: Every day | ORAL | 3 refills | Status: DC

## 2017-09-05 MED ORDER — ROSUVASTATIN CALCIUM 10 MG PO TABS: 10.0000 mg | ORAL_TABLET | ORAL | 3 refills | Status: DC

## 2017-09-05 NOTE — Patient Instructions (Addendum)
Dr Sallyanne Kuster has recommended making the following medication changes: 1. START Amlodipine 5 mg - take 1 tablet daily 2. INCREASE Crestor to 10 mg THREE times weekly  Your physician recommends that you schedule a follow-up appointment first available with Dr Sallyanne Kuster.    [facility] [address] [place] [address] Dept: [phone] Loc: [place]  09/05/2017  You are scheduled for a Cardiac Catheterization on Thursday, February 28th with Dr. Shelva Majestic.  1. Please arrive at the [facility] (Main Entrance A) at [facility]: [address] [place], [place] [address] at 11:30 AM (two hours before your procedure to ensure your preparation). Free valet parking service is available.  Special note: Every effort is made to have your procedure done on time. Please understand that emergencies sometimes delay scheduled procedures.  2. Diet: Do not eat or drink anything after midnight prior to your procedure except sips of water to take medications.  3. Labs: TODAY.  4. Medication instructions in preparation for your procedure:  HOLD Cozaar (Losartan) on Thursday, February 28th.    Take only 15 units of insulin the night before your procedure. Do not take any insulin on the day of the procedure.  On the morning of your procedure, take your Aspirin and any morning medicines NOT listed above.  You may use sips of water.  5. Plan for one night stay--bring personal belongings.  6. Bring a current list of your medications and current insurance cards.  7. You MUST have a responsible person to drive you home.  8. Someone MUST be with you the first 24 hours after you arrive home or your discharge will be delayed.  9. Please wear clothes that are easy to get on and off and wear slip-on shoes.  Thank you for allowing [place] to care for you!   -- [facility] [facility]  [facility]

## 2017-09-05 NOTE — H&P (View-Only) (Signed)
Patient ID: [name], male   DOB: [birth year], 72 y.o.   MRN: [id number]    Cardiology Office Note    Date:  09/05/2017   ID:  [name], [name] 1945/09/20, MRN [id number]  PCP:  [name], [name], MD  Cardiologist:   Scott Klein, MD   No chief complaint on file.   History of Present Illness:  [name] is a 72 y.o. male with a long-standing history of coronary artery disease who returns for follow-up.  Last November he began experiencing chest symptoms again and was referred for a nuclear stress test which showed normal findings.  Isosorbide mononitrate was prescribed.  He took it briefly, but then stopped it since it made him feel poorly.  He has had worsening exertional angina.  This is particularly bad when the weather is cold.  Even taking the trash can out to the corner cold weather severe "burning" in the retrosternal area radiating to both arms and his neck.  This is his typical angina pattern, and he remembers experiencing it before his bypass surgery 2006 and before his right coronary artery stent in December 2015.  The symptoms resolve after 1-3 minutes of rest.  Reduced his beta-blocker dose in the past due to marked bradycardia.  He still has mild bradycardia today at 56 bpm.  He had been prescribed isosorbide but he is sleeping taking this medication for his chest burning symptoms worsened he try to take it again, but again felt unwell, " could not rest"  He denies dyspnea at rest or with activity, fatigue, dizziness or syncope.  Stopped daily rosuvastatin statin due to generalized myalgia and muscle weakness, especially severe leg weakness. Symptoms then resolved.  Gradually reintroduced rosuvastatin once weekly and then twice weekly medication.  He seems to be tolerating well on that regimen.  He underwent four-vessel bypass surgery in 2006 (LIMA-LAD, SVG x 3 to Diagonal, OM and PDA). Cardiac cath on 04/05/14 demonstrated worsening LVF with EF 35-40% and anterolateral  and apical hypo-akinesis, S-PDA occluded, S-Dx/OM with prox 60% and dist limb occluded (to diagonal), native AVCFX with 95%, L-LAD patent. Native RCA with prox 95% stenosis treated with BMS. Follow-up echocardiogram January 2016 shows normal left ventricular systolic function with ejection fraction of 55-60 %and a moderately dilated left atrium.  He has received a stent in the large right common iliac artery aneurysm and is monitored by Scott. Kellie Steele. He has insulin requiring diabetes mellitus. He has chronically low HDL cholesterol, around 27 mg/dL.  Past Medical History:  Diagnosis Date  . Arthritis    "back" (04/05/2014)  . Atrial fibrillation ([place])   . Blood in stool   . CAD (coronary artery disease)   . 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 Scott [name] - to LAD, SVG to diagonal, SVG to OM, SVG to PDA ;  b.  LHC (9/15): ostial LAD occl, ostial CFX 60%, prox AVCFX 95%, RCA 95%, S-PDA occl, S-OM/Dx with Dx limb occl and prox 60%, L-LAD patent, EF 35-40% wtih ant-lat and apical HK-AK >> PCI:  BMS to native RCA   . Diverticulosis   . Dizziness   . DM type 2 causing complication ([place]) 1/0/6269  . GERD (gastroesophageal reflux disease)   . Hypertension   . Iliac artery aneurysm ([place])   . Ischemic cardiomyopathy    a. EF 35-40% by LV gram 03/2014  . Joint pain   . Kidney stones   . Kidney stones   . Lower back  pain Jan 2015  . Mixed hyperlipidemia 08/16/2013  . Myocardial infarction ([place]) 03/2014 X 2?  [name] Type II diabetes mellitus ([place])     Past Surgical History:  Procedure Laterality Date  . CARDIAC CATHETERIZATION  2006   "before OHS"  . CORONARY ANGIOPLASTY WITH STENT PLACEMENT  04/05/2014   "1"  . CORONARY ARTERY BYPASS GRAFT  2006   LIMA to LAD,SVG to diagonal,SVG to obtuse marginal,SVG to posterior descending  . CYSTOSCOPY W/ STONE MANIPULATION  1970's X 2  . CYSTOSCOPY W/ URETEROSCOPY W/ LITHOTRIPSY  ~ 2009  . ILIAC ARTERY STENT Left Aug. 10, 2011   Aorta to right ext iliac and left CIA  stent repair   . LEFT HEART CATHETERIZATION WITH CORONARY/GRAFT ANGIOGRAM N/A 04/05/2014   Procedure: LEFT HEART CATHETERIZATION WITH [name];  Surgeon: Scott Sine, MD;  Location: [facility] C[facility] LAB;  Service: Cardiovascular;  Laterality: N/A;  . NM MYOCAR PERF WALL MOTION  11/18/2009   low risk  . TONSILLECTOMY AND ADENOIDECTOMY  1951    Outpatient Medications Prior to Visit  Medication Sig Dispense Refill  . aspirin 81 MG tablet Take 81 mg by mouth daily.      . carvedilol (COREG) 6.25 MG tablet TAKE 1 TABLET BY MOUTH TWO  TIMES DAILY 180 tablet 2  . glucose blood test strip Use as directed. 100 each 5  . insulin glargine (LANTUS) 100 UNIT/ML injection Inject 0.3 mLs (30 Units total) into the skin every morning. 30 mL 1  . losartan (COZAAR) 50 MG tablet TAKE 1 TABLET BY MOUTH TWO  TIMES DAILY 180 tablet 2  . pantoprazole (PROTONIX) 40 MG tablet Take 1 tablet (40 mg total) by mouth daily. 90 tablet 1  . ULTICARE MINI PEN NEEDLES 31G X 6 MM MISC USE AS DIRECTED 100 each 0  . isosorbide mononitrate (IMDUR) 30 MG 24 hr tablet Take 0.5 tablets (15 mg total) daily by mouth. 1/2 TABLET X2 DAYS THEN INCREASE TO WHOLE TABLET 30 tablet 5  . nitroGLYCERIN (NITROSTAT) 0.4 MG SL tablet Place 1 tablet (0.4 mg total) every 5 (five) minutes as needed under the tongue for chest pain. 90 tablet 3  . rosuvastatin (CRESTOR) 10 MG tablet Take 1 tablet (10 mg total) by mouth 2 (two) times a week. 10 tablet 11   No facility-administered medications prior to visit.      Allergies:   Codeine and Valium   Social History   Socioeconomic History  . Marital status: Married    Spouse name: None  . Number of children: None  . Years of education: None  . Highest education level: None  Social Needs  . Financial resource strain: None  . Food insecurity - worry: None  . Food insecurity - inability: None  . Transportation needs - medical: None  . Transportation needs - non-medical: None    Occupational History  . None  Tobacco Use  . Smoking status: Light Tobacco Smoker    Years: 2.00    Types: Cigars  . Smokeless tobacco: Never Used  Substance and Sexual Activity  . Alcohol use: No  . Drug use: No  . Sexual activity: Yes  Other Topics Concern  . None  Social History Narrative  . None     Family History:  The patient's family history includes Diabetes in his brother and mother; Heart disease in his mother, sister, and son; Hypertension in his mother.   ROS:   Please see the history of present illness.  ROS All other systems reviewed and are negative.   PHYSICAL EXAM:   VS:  BP (!) 178/82   Pulse 64   Ht 5\' 9"  (1.753 m)   Wt 174 lb 6.4 oz (79.1 kg)   BMI 25.75 kg/m    Recheck blood pressure 162/81 General: Alert, oriented x3, no distress, relatively lean Head: no evidence of trauma, PERRL, EOMI, no exophtalmos or lid lag, no myxedema, no xanthelasma; normal ears, nose and oropharynx Neck: normal jugular venous pulsations and no hepatojugular reflux; brisk carotid pulses without delay and no carotid bruits Chest: clear to auscultation, no signs of consolidation by percussion or palpation, normal fremitus, symmetrical and full respiratory excursions Cardiovascular: normal position and quality of the apical impulse, regular rhythm, normal first and second heart sounds, no murmurs, rubs or gallops Abdomen: no tenderness or distention, no masses by palpation, no abnormal pulsatility or arterial bruits, normal bowel sounds, no hepatosplenomegaly Extremities: no clubbing, cyanosis or edema; 2+ radial, ulnar and brachial pulses bilaterally; 2+ right femoral, posterior tibial and dorsalis pedis pulses; 2+ left femoral, posterior tibial and dorsalis pedis pulses; no subclavian or femoral bruits Neurological: grossly nonfocal Psych: Normal mood and affect   Wt Readings from Last 3 Encounters:  09/05/17 174 lb 6.4 oz (79.1 kg)  06/10/17 175 lb 6 oz (79.5 kg)   05/31/17 173 lb (78.5 kg)      Studies/Labs Reviewed:   EKG:  EKG is ordered today.  Shows sinus bradycardia prominent downsloping ST depression and T wave inversion in lead I and aVL and flat ST depression in leads V5-V6.  Unadjusted QT 492 ms.   Recent Labs: 06/15/2016 hemoglobin 15.1, glucose 119, normal LFTs, creatinine 1.06, potassium 4.0 Hemoglobin A1c 6.6%, total cholesterol 194, HDL 26, LDL 117, triglycerides 255.  Lipid Panel     Component Value Date/Time   CHOL 135 12/28/2016 0936   TRIG 265.0 (H) 12/28/2016 0936   HDL 25.60 (L) 12/28/2016 0936   CHOLHDL 5 12/28/2016 0936   VLDL 53.0 (H) 12/28/2016 0936   LDLCALC 117 06/15/2016   LDLDIRECT 74.0 12/28/2016 0936     ASSESSMENT:    1. Coronary artery disease involving native coronary artery of native heart with unstable angina pectoris ([place])   2. Chronic diastolic heart failure ([place])   3. Bradycardia, drug induced   4. Essential hypertension   5. PAD (peripheral artery disease) ([place])   6. Hypercholesterolemia   7. Diabetes mellitus type 2 in nonobese (H[facility])   8. Abnormal ECG   9. Pre-procedure lab exam      PLAN:  In order of problems listed above:  1. CAD: Has crescendo exertional angina (CCS 2-3) and worrisome ECG changes.  He does not have chest pain at rest and has not had any complaints of chest pain today.  I encouraged him to have a cardiac catheterization wait until his wife has her hand surgery and is "settled".  He is scheduled for diagnostic cardiac catheterization and possible percutaneous revascularization with Scott. Claiborne Billings on Thursday, February 28. This procedure has been fully reviewed with the patient and written informed consent has been obtained.  I told him several times that he should come to the hospital for any chest discomfort that lasts for more than 30 minutes and is not relieved by 3 consecutive sublingual nitroglycerin tablets. 2. CHF: Left ventricular systolic function has returned to normal  as of his most recent echocardiogram. NYHA functional class I.  3. Bradycardia: Fatigue has improved on a lower dose  of carvedilol, remains bradycardic. 4. HTN: Amlodipine 5 mg daily as an antianginal and antihypertensive. 5. PAD: History of stent graft for aorto-iliac aneurysm in 2011, being followed in the vascular surgery clinic.  He is scheduled for an ultrasound follow-up on March 4. 6. HLP: Tolerating twice weekly rosuvastatin, will try to push to 3 days weekly.  Target LDL 70 or less.  Discuss Repatha if this does not work 7. DM: Excellent control on insulin as monotherapy.  HgbA1C most recently 6.3%.  He has lost substantial weight.    Medication Adjustments/Labs and Tests Ordered: Current medicines are reviewed at length with the patient today.  Concerns regarding medicines are outlined above.  Medication changes, Labs and Tests ordered today are listed in the Patient Instructions below. Patient Instructions  Scott Sallyanne Kuster has recommended making the following medication changes: 1. START Amlodipine 5 mg - take 1 tablet daily 2. INCREASE Crestor to 10 mg THREE times weekly  Your physician recommends that you schedule a follow-up appointment first available with Scott Sallyanne Kuster.    [facility] [address] [place] [address] Dept: [phone] Loc: [place]  09/05/2017  You are scheduled for a Cardiac Catheterization on Thursday, February 28th with Scott. Shelva Majestic.  1. Please arrive at the [facility] (Main Entrance A) at [facility]: [address] [place], [place] [address] at 11:30 AM (two hours before your procedure to ensure your preparation). Free valet parking service is available.  Special note: Every effort is made to have your procedure done on time. Please understand that emergencies sometimes delay scheduled procedures.  2. Diet: Do not eat or drink  anything after midnight prior to your procedure except sips of water to take medications.  3. Labs: TODAY.  4. Medication instructions in preparation for your procedure:  HOLD Cozaar (Losartan) on Thursday, February 28th.    Take only 15 units of insulin the night before your procedure. Do not take any insulin on the day of the procedure.  On the morning of your procedure, take your Aspirin and any morning medicines NOT listed above.  You may use sips of water.  5. Plan for one night stay--bring personal belongings.  6. Bring a current list of your medications and current insurance cards.  7. You MUST have a responsible person to drive you home.  8. Someone MUST be with you the first 24 hours after you arrive home or your discharge will be delayed.  9. Please wear clothes that are easy to get on and off and wear slip-on shoes.  Thank you for allowing [place] to care for you!   -- [facility] In[facility] Cardiovascular services      Signed, Scott Klein, MD  09/05/2017 1:34 PM    [place], [place], [place]  [address] Phone: [phone]; Fax: [phone]

## 2017-09-05 NOTE — Progress Notes (Signed)
Patient ID: [name], male   DOB: 01-18-1946, 72 y.o.   MRN: [id number]    Cardiology Office Note    Date:  09/05/2017   ID:  [name], [name] 1946-01-09, MRN [id number]  PCP:  [name], Scott G, MD  Cardiologist:   Scott Klein, MD   No chief complaint on file.   History of Present Illness:  [name] is a 72 y.o. male with a long-standing history of coronary artery disease who returns for follow-up.  Last November he began experiencing chest symptoms again and was referred for a nuclear stress test which showed normal findings.  Isosorbide mononitrate was prescribed.  He took it briefly, but then stopped it since it made him feel poorly.  He has had worsening exertional angina.  This is particularly bad when the weather is cold.  Even taking the trash can out to the corner cold weather severe "burning" in the retrosternal area radiating to both arms and his neck.  This is his typical angina pattern, and he remembers experiencing it before his bypass surgery 2006 and before his right coronary artery stent in December 2015.  The symptoms resolve after 1-3 minutes of rest.  Reduced his beta-blocker dose in the past due to marked bradycardia.  He still has mild bradycardia today at 56 bpm.  He had been prescribed isosorbide but he is sleeping taking this medication for his chest burning symptoms worsened he try to take it again, but again felt unwell, " could not rest"  He denies dyspnea at rest or with activity, fatigue, dizziness or syncope.  Stopped daily rosuvastatin statin due to generalized myalgia and muscle weakness, especially severe leg weakness. Symptoms then resolved.  Gradually reintroduced rosuvastatin once weekly and then twice weekly medication.  He seems to be tolerating well on that regimen.  He underwent four-vessel bypass surgery in 2006 (LIMA-LAD, SVG x 3 to Diagonal, OM and PDA). Cardiac cath on 04/05/14 demonstrated worsening LVF with EF 35-40% and anterolateral  and apical hypo-akinesis, S-PDA occluded, S-Dx/OM with prox 60% and dist limb occluded (to diagonal), native AVCFX with 95%, L-LAD patent. Native RCA with prox 95% stenosis treated with BMS. Follow-up echocardiogram January 2016 shows normal left ventricular systolic function with ejection fraction of 55-60 %and a moderately dilated left atrium.  He has received a stent in the large right common iliac artery aneurysm and is monitored by Dr. Kellie Steele. He has insulin requiring diabetes mellitus. He has chronically low HDL cholesterol, around 27 mg/dL.  Past Medical History:  Diagnosis Date  . Arthritis    "back" (04/05/2014)  . Atrial fibrillation ([name])   . Blood in stool   . CAD (coronary artery disease)   . 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 Dr [name] - to LAD, SVG to diagonal, SVG to OM, SVG to PDA ;  b.  LHC (9/15): ostial LAD occl, ostial CFX 60%, prox AVCFX 95%, RCA 95%, S-PDA occl, S-OM/Dx with Dx limb occl and prox 60%, L-LAD patent, EF 35-40% wtih ant-lat and apical HK-AK >> PCI:  BMS to native RCA   . Diverticulosis   . Dizziness   . DM type 2 causing complication ([name]) 01/17/2955  . GERD (gastroesophageal reflux disease)   . Hypertension   . Iliac artery aneurysm ([name])   . Ischemic cardiomyopathy    a. EF 35-40% by LV gram 03/2014  . Joint pain   . Kidney stones   . Kidney stones   . Lower back  pain Jan 2015  . Mixed hyperlipidemia 08/16/2013  . Myocardial infarction ([name]) 03/2014 X 2?  [name] Type II diabetes mellitus ([name])     Past Surgical History:  Procedure Laterality Date  . CARDIAC CATHETERIZATION  2006   "before [facility]"  . CORONARY ANGIOPLASTY WITH STENT PLACEMENT  04/05/2014   "1"  . CORONARY ARTERY BYPASS GRAFT  2006   LIMA to LAD,SVG to diagonal,SVG to obtuse marginal,SVG to posterior descending  . CYSTOSCOPY W/ STONE MANIPULATION  1970's X 2  . CYSTOSCOPY W/ URETEROSCOPY W/ LITHOTRIPSY  ~ 2009  . ILIAC ARTERY STENT Left Aug. 10, 2011   Aorta to right ext iliac and left CIA  stent repair   . LEFT HEART CATHETERIZATION WITH CORONARY/GRAFT ANGIOGRAM N/A 04/05/2014   Procedure: LEFT HEART CATHETERIZATION WITH [name];  Surgeon: [name], MD;  Location: [facility] CATH LAB;  Service: Cardiovascular;  Laterality: N/A;  . NM MYOCAR PERF WALL MOTION  11/18/2009   low risk  . TONSILLECTOMY AND ADENOIDECTOMY  1951    Outpatient Medications Prior to Visit  Medication Sig Dispense Refill  . aspirin 81 MG tablet Take 81 mg by mouth daily.      . carvedilol (COREG) 6.25 MG tablet TAKE 1 TABLET BY MOUTH TWO  TIMES DAILY 180 tablet 2  . glucose blood test strip Use as directed. 100 each 5  . insulin glargine (LANTUS) 100 UNIT/ML injection Inject 0.3 mLs (30 Units total) into the skin every morning. 30 mL 1  . losartan (COZAAR) 50 MG tablet TAKE 1 TABLET BY MOUTH TWO  TIMES DAILY 180 tablet 2  . pantoprazole (PROTONIX) 40 MG tablet Take 1 tablet (40 mg total) by mouth daily. 90 tablet 1  . ULTICARE MINI PEN NEEDLES 31G X 6 MM MISC USE AS DIRECTED 100 each 0  . isosorbide mononitrate (IMDUR) 30 MG 24 hr tablet Take 0.5 tablets (15 mg total) daily by mouth. 1/2 TABLET X2 DAYS THEN INCREASE TO WHOLE TABLET 30 tablet 5  . nitroGLYCERIN (NITROSTAT) 0.4 MG SL tablet Place 1 tablet (0.4 mg total) every 5 (five) minutes as needed under the tongue for chest pain. 90 tablet 3  . rosuvastatin (CRESTOR) 10 MG tablet Take 1 tablet (10 mg total) by mouth 2 (two) times a week. 10 tablet 11   No facility-administered medications prior to visit.      Allergies:   Codeine and Valium   Social History   Socioeconomic History  . Marital status: Married    Spouse name: None  . Number of children: None  . Years of education: None  . Highest education level: None  Social Needs  . Financial resource strain: None  . Food insecurity - worry: None  . Food insecurity - inability: None  . Transportation needs - medical: None  . Transportation needs - non-medical: None    Occupational History  . None  Tobacco Use  . Smoking status: Light Tobacco Smoker    Years: 2.00    Types: Cigars  . Smokeless tobacco: Never Used  Substance and Sexual Activity  . Alcohol use: No  . Drug use: No  . Sexual activity: Yes  Other Topics Concern  . None  Social History Narrative  . None     Family History:  The patient's family history includes Diabetes in his brother and mother; Heart disease in his mother, sister, and son; Hypertension in his mother.   ROS:   Please see the history of present illness.  ROS All other systems reviewed and are negative.   PHYSICAL EXAM:   VS:  BP (!) 178/82   Pulse 64   Ht 5\' 9"  (1.753 m)   Wt 174 lb 6.4 oz (79.1 kg)   BMI 25.75 kg/m    Recheck blood pressure 162/81 General: Alert, oriented x3, no distress, relatively lean Head: no evidence of trauma, PERRL, EOMI, no exophtalmos or lid lag, no myxedema, no xanthelasma; normal ears, nose and oropharynx Neck: normal jugular venous pulsations and no hepatojugular reflux; brisk carotid pulses without delay and no carotid bruits Chest: clear to auscultation, no signs of consolidation by percussion or palpation, normal fremitus, symmetrical and full respiratory excursions Cardiovascular: normal position and quality of the apical impulse, regular rhythm, normal first and second heart sounds, no murmurs, rubs or gallops Abdomen: no tenderness or distention, no masses by palpation, no abnormal pulsatility or arterial bruits, normal bowel sounds, no hepatosplenomegaly Extremities: no clubbing, cyanosis or edema; 2+ radial, ulnar and brachial pulses bilaterally; 2+ right femoral, posterior tibial and dorsalis pedis pulses; 2+ left femoral, posterior tibial and dorsalis pedis pulses; no subclavian or femoral bruits Neurological: grossly nonfocal Psych: Normal mood and affect   Wt Readings from Last 3 Encounters:  09/05/17 174 lb 6.4 oz (79.1 kg)  06/10/17 175 lb 6 oz (79.5 kg)   05/31/17 173 lb (78.5 kg)      Studies/Labs Reviewed:   EKG:  EKG is ordered today.  Shows sinus bradycardia prominent downsloping ST depression and T wave inversion in lead I and aVL and flat ST depression in leads V5-V6.  Unadjusted QT 492 ms.   Recent Labs: 06/15/2016 hemoglobin 15.1, glucose 119, normal LFTs, creatinine 1.06, potassium 4.0 Hemoglobin A1c 6.6%, total cholesterol 194, HDL 26, LDL 117, triglycerides 255.  Lipid Panel     Component Value Date/Time   CHOL 135 12/28/2016 0936   TRIG 265.0 (H) 12/28/2016 0936   HDL 25.60 (L) 12/28/2016 0936   CHOLHDL 5 12/28/2016 0936   VLDL 53.0 (H) 12/28/2016 0936   LDLCALC 117 06/15/2016   LDLDIRECT 74.0 12/28/2016 0936     ASSESSMENT:    1. Coronary artery disease involving native coronary artery of native heart with unstable angina pectoris ([place])   2. Chronic diastolic heart failure ([place])   3. Bradycardia, drug induced   4. Essential hypertension   5. PAD (peripheral artery disease) ([place])   6. Hypercholesterolemia   7. Diabetes mellitus type 2 in nonobese (HCC)   8. Abnormal ECG   9. Pre-procedure lab exam      PLAN:  In order of problems listed above:  1. CAD: Has crescendo exertional angina (CCS 2-3) and worrisome ECG changes.  He does not have chest pain at rest and has not had any complaints of chest pain today.  I encouraged him to have a cardiac catheterization wait until his wife has her hand surgery and is "settled".  He is scheduled for diagnostic cardiac catheterization and possible percutaneous revascularization with Dr. Claiborne Billings on Thursday, February 28. This procedure has been fully reviewed with the patient and written informed consent has been obtained.  I told him several times that he should come to the hospital for any chest discomfort that lasts for more than 30 minutes and is not relieved by 3 consecutive sublingual nitroglycerin tablets. 2. CHF: Left ventricular systolic function has returned to normal  as of his most recent echocardiogram. NYHA functional class I.  3. Bradycardia: Fatigue has improved on a lower dose  of carvedilol, remains bradycardic. 4. HTN: Amlodipine 5 mg daily as an antianginal and antihypertensive. 5. PAD: History of stent graft for aorto-iliac aneurysm in 2011, being followed in the vascular surgery clinic.  He is scheduled for an ultrasound follow-up on March 4. 6. HLP: Tolerating twice weekly rosuvastatin, will try to push to 3 days weekly.  Target LDL 70 or less.  Discuss Repatha if this does not work 7. DM: Excellent control on insulin as monotherapy.  HgbA1C most recently 6.3%.  He has lost substantial weight.    Medication Adjustments/Labs and Tests Ordered: Current medicines are reviewed at length with the patient today.  Concerns regarding medicines are outlined above.  Medication changes, Labs and Tests ordered today are listed in the Patient Instructions below. Patient Instructions  Dr Sallyanne Kuster has recommended making the following medication changes: 1. START Amlodipine 5 mg - take 1 tablet daily 2. INCREASE Crestor to 10 mg THREE times weekly  Your physician recommends that you schedule a follow-up appointment first available with Dr Sallyanne Kuster.    [facility] [address] [place] [address] Dept: [phone] Loc: [place]  09/05/2017  You are scheduled for a Cardiac Catheterization on Thursday, February 28th with Dr. Shelva Majestic.  1. Please arrive at the [facility] (Main Entrance A) at [facility]: [address] [place], [place] [address] at 11:30 AM (two hours before your procedure to ensure your preparation). Free valet parking service is available.  Special note: Every effort is made to have your procedure done on time. Please understand that emergencies sometimes delay scheduled procedures.  2. Diet: Do not eat or drink  anything after midnight prior to your procedure except sips of water to take medications.  3. Labs: TODAY.  4. Medication instructions in preparation for your procedure:  HOLD Cozaar (Losartan) on Thursday, February 28th.    Take only 15 units of insulin the night before your procedure. Do not take any insulin on the day of the procedure.  On the morning of your procedure, take your Aspirin and any morning medicines NOT listed above.  You may use sips of water.  5. Plan for one night stay--bring personal belongings.  6. Bring a current list of your medications and current insurance cards.  7. You MUST have a responsible person to drive you home.  8. Someone MUST be with you the first 24 hours after you arrive home or your discharge will be delayed.  9. Please wear clothes that are easy to get on and off and wear slip-on shoes.  Thank you for allowing [place] to care for you!   -- [facility] Invasive Cardiovascular services      Signed, Scott Klein, MD  09/05/2017 1:34 PM    [place], [place], [place]  [address] Phone: [phone]; Fax: [phone]

## 2017-09-06 ENCOUNTER — Telehealth: Payer: Self-pay | Admitting: *Deleted

## 2017-09-06 LAB — CBC
Hematocrit: 44.3 % (ref 37.5–51.0)
Hemoglobin: 14.9 g/dL (ref 13.0–17.7)
MCH: 29.2 pg (ref 26.6–33.0)
MCHC: 33.6 g/dL (ref 31.5–35.7)
MCV: 87 fL (ref 79–97)
Platelets: 176 10*3/uL (ref 150–379)
RBC: 5.11 x10E6/uL (ref 4.14–5.80)
RDW: 14 % (ref 12.3–15.4)
WBC: 7.6 10*3/uL (ref 3.4–10.8)

## 2017-09-06 LAB — BASIC METABOLIC PANEL
BUN/Creatinine Ratio: 11 (ref 10–24)
BUN: 12 mg/dL (ref 8–27)
CO2: 24 mmol/L (ref 20–29)
Calcium: 9.4 mg/dL (ref 8.6–10.2)
Chloride: 102 mmol/L (ref 96–106)
Creatinine, Ser: 1.09 mg/dL (ref 0.76–1.27)
GFR calc Af Amer: 79 mL/min/{1.73_m2} (ref >59)
GFR calc non Af Amer: 68 mL/min/{1.73_m2} (ref >59)
Glucose: 108 mg/dL — ABNORMAL HIGH (ref 65–99)
Potassium: 4.1 mmol/L (ref 3.5–5.2)
Sodium: 142 mmol/L (ref 134–144)

## 2017-09-06 LAB — LIPID PANEL
Chol/HDL Ratio: 5.9 ratio — ABNORMAL HIGH (ref 0.0–5.0)
Cholesterol, Total: 170 mg/dL (ref 100–199)
HDL: 29 mg/dL — ABNORMAL LOW (ref >39)
LDL Calculated: 104 mg/dL — ABNORMAL HIGH (ref 0–99)
Triglycerides: 183 mg/dL — ABNORMAL HIGH (ref 0–149)
VLDL Cholesterol Cal: 37 mg/dL (ref 5–40)

## 2017-09-06 LAB — PROTIME-INR
INR: 1 (ref 0.8–1.2)
Prothrombin Time: 10.8 s (ref 9.1–12.0)

## 2017-09-06 NOTE — Telephone Encounter (Signed)
Catheterization scheduled at [facility] for: Thursday February 28,2019 1:30 PM Arrival time and place: [place] Entrance A/[place] at: 11:30 AM Nothing to eat or drink after midnight night before cath.  Hold: Losartan AM of cath Insulin AM of cath 1/2 Insulin evening before cath  Except hold medications AM meds can be  taken pre-cath with sip of water including: ASA 81 mg AM of cath   Patient has responsible person to drive home post procedure and observe patient for 24 hours  LMTCB for pt to discuss instructions.

## 2017-09-06 NOTE — Telephone Encounter (Signed)
I spoke with patient, discussed cath instructions with patient. Pt states he has discussed and worked out insulin dosing pre-cath with MD, pt instructed to hold losartan AM of cath, take ASA 81 mg AM of cath.

## 2017-09-07 ENCOUNTER — Other Ambulatory Visit: Payer: Self-pay

## 2017-09-07 DIAGNOSIS — I739 Peripheral vascular disease, unspecified: Secondary | ICD-10-CM

## 2017-09-08 ENCOUNTER — Encounter (HOSPITAL_COMMUNITY)
Admission: AD | Disposition: A | Discharge status: Home / Self Care | Payer: Self-pay | Source: Ambulatory Visit | Attending: Cardiovascular Disease

## 2017-09-08 ENCOUNTER — Inpatient Hospital Stay (HOSPITAL_COMMUNITY)
Admission: AD | Admit: 2017-09-08 | Discharge: 2017-09-10 | DRG: 247 | Disposition: A | Discharge status: Home / Self Care | Payer: Medicare Other | Source: Ambulatory Visit | Attending: Cardiovascular Disease | Admitting: Cardiovascular Disease

## 2017-09-08 DIAGNOSIS — I200 Unstable angina: Secondary | ICD-10-CM | POA: Diagnosis present

## 2017-09-08 DIAGNOSIS — T45525A Adverse effect of antithrombotic drugs, initial encounter: Secondary | ICD-10-CM | POA: Diagnosis not present

## 2017-09-08 DIAGNOSIS — E782 Mixed hyperlipidemia: Secondary | ICD-10-CM | POA: Diagnosis present

## 2017-09-08 DIAGNOSIS — K219 Gastro-esophageal reflux disease without esophagitis: Secondary | ICD-10-CM | POA: Diagnosis not present

## 2017-09-08 DIAGNOSIS — Z9049 Acquired absence of other specified parts of digestive tract: Secondary | ICD-10-CM

## 2017-09-08 DIAGNOSIS — I739 Peripheral vascular disease, unspecified: Secondary | ICD-10-CM

## 2017-09-08 DIAGNOSIS — I25710 Atherosclerosis of autologous vein coronary artery bypass graft(s) with unstable angina pectoris: Secondary | ICD-10-CM | POA: Diagnosis not present

## 2017-09-08 DIAGNOSIS — Z720 Tobacco use: Secondary | ICD-10-CM | POA: Diagnosis not present

## 2017-09-08 DIAGNOSIS — Y92230 Patient room in hospital as the place of occurrence of the external cause: Secondary | ICD-10-CM | POA: Diagnosis present

## 2017-09-08 DIAGNOSIS — Z95820 Peripheral vascular angioplasty status with implants and grafts: Secondary | ICD-10-CM | POA: Diagnosis not present

## 2017-09-08 DIAGNOSIS — Z8679 Personal history of other diseases of the circulatory system: Secondary | ICD-10-CM | POA: Diagnosis not present

## 2017-09-08 DIAGNOSIS — I252 Old myocardial infarction: Secondary | ICD-10-CM | POA: Diagnosis not present

## 2017-09-08 DIAGNOSIS — I2582 Chronic total occlusion of coronary artery: Secondary | ICD-10-CM | POA: Diagnosis not present

## 2017-09-08 DIAGNOSIS — E1140 Type 2 diabetes mellitus with diabetic neuropathy, unspecified: Secondary | ICD-10-CM | POA: Diagnosis present

## 2017-09-08 DIAGNOSIS — I779 Disorder of arteries and arterioles, unspecified: Secondary | ICD-10-CM | POA: Diagnosis present

## 2017-09-08 DIAGNOSIS — R9431 Abnormal electrocardiogram [ECG] [EKG]: Secondary | ICD-10-CM

## 2017-09-08 DIAGNOSIS — I255 Ischemic cardiomyopathy: Secondary | ICD-10-CM | POA: Diagnosis not present

## 2017-09-08 DIAGNOSIS — T447X5A Adverse effect of beta-adrenoreceptor antagonists, initial encounter: Secondary | ICD-10-CM | POA: Diagnosis present

## 2017-09-08 DIAGNOSIS — I4891 Unspecified atrial fibrillation: Secondary | ICD-10-CM | POA: Diagnosis present

## 2017-09-08 DIAGNOSIS — I25110 Atherosclerotic heart disease of native coronary artery with unstable angina pectoris: Secondary | ICD-10-CM | POA: Diagnosis present

## 2017-09-08 DIAGNOSIS — Z955 Presence of coronary angioplasty implant and graft: Secondary | ICD-10-CM

## 2017-09-08 DIAGNOSIS — I5042 Chronic combined systolic (congestive) and diastolic (congestive) heart failure: Secondary | ICD-10-CM | POA: Diagnosis present

## 2017-09-08 DIAGNOSIS — I10 Essential (primary) hypertension: Secondary | ICD-10-CM | POA: Diagnosis present

## 2017-09-08 DIAGNOSIS — I110 Hypertensive heart disease with heart failure: Secondary | ICD-10-CM | POA: Diagnosis present

## 2017-09-08 DIAGNOSIS — R001 Bradycardia, unspecified: Secondary | ICD-10-CM | POA: Diagnosis not present

## 2017-09-08 DIAGNOSIS — Z794 Long term (current) use of insulin: Secondary | ICD-10-CM

## 2017-09-08 DIAGNOSIS — Z7982 Long term (current) use of aspirin: Secondary | ICD-10-CM | POA: Diagnosis not present

## 2017-09-08 DIAGNOSIS — R0600 Dyspnea, unspecified: Secondary | ICD-10-CM | POA: Diagnosis not present

## 2017-09-08 DIAGNOSIS — Z7902 Long term (current) use of antithrombotics/antiplatelets: Secondary | ICD-10-CM

## 2017-09-08 DIAGNOSIS — I25700 Atherosclerosis of coronary artery bypass graft(s), unspecified, with unstable angina pectoris: Principal | ICD-10-CM | POA: Diagnosis present

## 2017-09-08 DIAGNOSIS — Z951 Presence of aortocoronary bypass graft: Secondary | ICD-10-CM | POA: Diagnosis not present

## 2017-09-08 DIAGNOSIS — E1151 Type 2 diabetes mellitus with diabetic peripheral angiopathy without gangrene: Secondary | ICD-10-CM | POA: Diagnosis present

## 2017-09-08 DIAGNOSIS — I25810 Atherosclerosis of coronary artery bypass graft(s) without angina pectoris: Secondary | ICD-10-CM | POA: Diagnosis present

## 2017-09-08 DIAGNOSIS — K649 Unspecified hemorrhoids: Secondary | ICD-10-CM | POA: Diagnosis not present

## 2017-09-08 DIAGNOSIS — Z833 Family history of diabetes mellitus: Secondary | ICD-10-CM

## 2017-09-08 DIAGNOSIS — Z79899 Other long term (current) drug therapy: Secondary | ICD-10-CM

## 2017-09-08 DIAGNOSIS — Z8249 Family history of ischemic heart disease and other diseases of the circulatory system: Secondary | ICD-10-CM

## 2017-09-08 HISTORY — DX: Bradycardia, unspecified: R00.1

## 2017-09-08 HISTORY — DX: Peripheral vascular angioplasty status with implants and grafts: Z95.820

## 2017-09-08 HISTORY — PX: LEFT HEART CATH AND CORS/GRAFTS ANGIOGRAPHY: CATH118250

## 2017-09-08 LAB — GLUCOSE, CAPILLARY
Glucose-Capillary: 118 mg/dL — ABNORMAL HIGH (ref 65–99)
Glucose-Capillary: 100 mg/dL — ABNORMAL HIGH (ref 65–99)
Glucose-Capillary: 136 mg/dL — ABNORMAL HIGH (ref 65–99)

## 2017-09-08 SURGERY — LEFT HEART CATH AND CORS/GRAFTS ANGIOGRAPHY
Anesthesia: LOCAL

## 2017-09-08 MED ORDER — ACETAMINOPHEN 325 MG PO TABS: 650.0000 mg | ORAL_TABLET | ORAL | Status: DC | PRN

## 2017-09-08 MED ORDER — LIDOCAINE HCL (PF) 1 % IJ SOLN
INTRAMUSCULAR | Status: AC
  Filled 2017-09-08: qty 30

## 2017-09-08 MED ORDER — IOPAMIDOL (ISOVUE-370) INJECTION 76%
INTRAVENOUS | Status: DC | PRN
  Administered 2017-09-08: 90 mL via INTRA_ARTERIAL

## 2017-09-08 MED ORDER — CARVEDILOL 3.125 MG PO TABS
6.2500 mg | ORAL_TABLET | Freq: Two times a day (BID) | ORAL | Status: DC
  Administered 2017-09-08 – 2017-09-10 (×4): 6.25 mg via ORAL
  Filled 2017-09-08 (×4): qty 2

## 2017-09-08 MED ORDER — FENTANYL CITRATE (PF) 100 MCG/2ML IJ SOLN
INTRAMUSCULAR | Status: DC | PRN
  Administered 2017-09-08 (×2): 25 ug via INTRAVENOUS

## 2017-09-08 MED ORDER — MIDAZOLAM HCL 2 MG/2ML IJ SOLN
INTRAMUSCULAR | Status: AC
  Filled 2017-09-08: qty 2

## 2017-09-08 MED ORDER — FENTANYL CITRATE (PF) 100 MCG/2ML IJ SOLN
INTRAMUSCULAR | Status: AC
  Filled 2017-09-08: qty 2

## 2017-09-08 MED ORDER — SODIUM CHLORIDE 0.9 % IV SOLN
INTRAVENOUS | Status: DC
  Administered 2017-09-08: 17:00:00 via INTRAVENOUS

## 2017-09-08 MED ORDER — IOPAMIDOL (ISOVUE-370) INJECTION 76%
INTRAVENOUS | Status: AC
  Filled 2017-09-08: qty 150

## 2017-09-08 MED ORDER — ASPIRIN 81 MG PO CHEW: 81.0000 mg | CHEWABLE_TABLET | ORAL | Status: DC

## 2017-09-08 MED ORDER — HEPARIN (PORCINE) IN NACL 2-0.9 UNIT/ML-% IJ SOLN
INTRAMUSCULAR | Status: AC | PRN
  Administered 2017-09-08 (×2): 500 mL

## 2017-09-08 MED ORDER — LIDOCAINE HCL (PF) 1 % IJ SOLN
INTRAMUSCULAR | Status: DC | PRN
  Administered 2017-09-08: 20 mL

## 2017-09-08 MED ORDER — SODIUM CHLORIDE 0.9 % IV SOLN: 250.0000 mL | INTRAVENOUS | Status: DC | PRN

## 2017-09-08 MED ORDER — SODIUM CHLORIDE 0.9% FLUSH: 3.0000 mL | INTRAVENOUS | Status: DC | PRN

## 2017-09-08 MED ORDER — TICAGRELOR 90 MG PO TABS
90.0000 mg | ORAL_TABLET | Freq: Two times a day (BID) | ORAL | Status: DC
  Administered 2017-09-09: 06:00:00 90 mg via ORAL
  Filled 2017-09-08 (×2): qty 1

## 2017-09-08 MED ORDER — TICAGRELOR 90 MG PO TABS: 180.0000 mg | ORAL_TABLET | Freq: Once | ORAL | Status: DC

## 2017-09-08 MED ORDER — HEPARIN (PORCINE) IN NACL 2-0.9 UNIT/ML-% IJ SOLN
INTRAMUSCULAR | Status: AC
  Filled 2017-09-08: qty 1000

## 2017-09-08 MED ORDER — MIDAZOLAM HCL 2 MG/2ML IJ SOLN
INTRAMUSCULAR | Status: DC | PRN
  Administered 2017-09-08: 1 mg via INTRAVENOUS
  Administered 2017-09-08: 2 mg via INTRAVENOUS

## 2017-09-08 MED ORDER — HEPARIN (PORCINE) IN NACL 100-0.45 UNIT/ML-% IJ SOLN
1100.0000 [IU]/h | INTRAMUSCULAR | Status: DC
  Administered 2017-09-09: 1100 [IU]/h via INTRAVENOUS
  Filled 2017-09-08: qty 250

## 2017-09-08 MED ORDER — LOSARTAN POTASSIUM 50 MG PO TABS
50.0000 mg | ORAL_TABLET | Freq: Two times a day (BID) | ORAL | Status: DC
  Administered 2017-09-08 – 2017-09-10 (×4): 50 mg via ORAL
  Filled 2017-09-08 (×4): qty 1

## 2017-09-08 MED ORDER — ASPIRIN 81 MG PO CHEW
81.0000 mg | CHEWABLE_TABLET | Freq: Every day | ORAL | Status: DC
  Administered 2017-09-10: 09:00:00 81 mg via ORAL
  Filled 2017-09-08 (×2): qty 1

## 2017-09-08 MED ORDER — ONDANSETRON HCL 4 MG/2ML IJ SOLN
4.0000 mg | Freq: Four times a day (QID) | INTRAMUSCULAR | Status: DC | PRN
  Administered 2017-09-08: 4 mg via INTRAVENOUS
  Filled 2017-09-08: qty 2

## 2017-09-08 MED ORDER — SODIUM CHLORIDE 0.9 % IV SOLN
INTRAVENOUS | Status: DC
  Administered 2017-09-08: 12:00:00 via INTRAVENOUS

## 2017-09-08 MED ORDER — SODIUM CHLORIDE 0.9% FLUSH
3.0000 mL | Freq: Two times a day (BID) | INTRAVENOUS | Status: DC
  Administered 2017-09-08 – 2017-09-09 (×2): 3 mL via INTRAVENOUS

## 2017-09-08 MED ORDER — ZOLPIDEM TARTRATE 5 MG PO TABS
5.0000 mg | ORAL_TABLET | Freq: Every evening | ORAL | Status: DC | PRN
  Administered 2017-09-08 – 2017-09-09 (×2): 5 mg via ORAL
  Filled 2017-09-08 (×2): qty 1

## 2017-09-08 MED ORDER — NITROGLYCERIN IN D5W 200-5 MCG/ML-% IV SOLN: 0.0000 ug/min | INTRAVENOUS | Status: DC

## 2017-09-08 SURGICAL SUPPLY — 10 items
CATH INFINITI 5 FR IM (CATHETERS) ×1 IMPLANT
CATH INFINITI 5 FR RCB (CATHETERS) ×1 IMPLANT
CATH INFINITI 5FR MULTPACK ANG (CATHETERS) ×1 IMPLANT
KIT HEART LEFT (KITS) ×2 IMPLANT
PACK CARDIAC CATHETERIZATION (CUSTOM PROCEDURE TRAY) ×2 IMPLANT
SHEATH PINNACLE 5F 10CM (SHEATH) ×1 IMPLANT
SYR MEDRAD MARK V 150ML (SYRINGE) ×2 IMPLANT
TRANSDUCER W/STOPCOCK (MISCELLANEOUS) ×2 IMPLANT
WIRE EMERALD 3MM-J .035X150CM (WIRE) ×1 IMPLANT
WIRE EMERALD 3MM-J .035X260CM (WIRE) ×1 IMPLANT

## 2017-09-08 NOTE — Progress Notes (Signed)
ANTICOAGULATION CONSULT NOTE - Initial Consult  Pharmacy Consult for Heparin Indication: CAD  Allergies  Allergen Reactions  . Codeine Nausea Only and Other (See Comments)    Cannot take on empty stomach.  . Valium Nausea Only and Other (See Comments)    Cannot take on empty stomach.    Patient Measurements: Height: 5\' 9"  (175.3 cm) Weight: 170 lb (77.1 kg) IBW/kg (Calculated) : 70.7 Heparin Dosing Weight: 77 kg  Vital Signs: Temp: 97.6 F (36.4 C) (02/28 1636) Temp Source: Oral (02/28 1636) BP: 174/91 (02/28 1636) Pulse Rate: 54 (02/28 1636)  Labs: No results for input(s): HGB, HCT, PLT, APTT, LABPROT, INR, HEPARINUNFRC, HEPRLOWMOCWT, CREATININE, CKTOTAL, CKMB, TROPONINI in the last 72 hours.  Estimated Creatinine Clearance: 62.2 mL/min (by C-G formula based on SCr of 1.09 mg/dL).   Medical History: Past Medical History:  Diagnosis Date  . Arthritis    "back" (04/05/2014)  . Atrial fibrillation ([place])   . Blood in stool   . CAD (coronary artery disease)   . 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 Dr Darcey Nora - to LAD, SVG to diagonal, SVG to OM, SVG to PDA ;  b.  LHC (9/15): ostial LAD occl, ostial CFX 60%, prox AVCFX 95%, RCA 95%, S-PDA occl, S-OM/Dx with Dx limb occl and prox 60%, L-LAD patent, EF 35-40% wtih ant-lat and apical HK-AK >> PCI:  BMS to native RCA   . Diverticulosis   . Dizziness   . DM type 2 causing complication ([place]) 03/21/3715  . GERD (gastroesophageal reflux disease)   . Hypertension   . Iliac artery aneurysm ([place])   . Ischemic cardiomyopathy    a. EF 35-40% by LV gram 03/2014  . Joint pain   . Kidney stones   . Kidney stones   . Lower back pain Jan 2015  . Mixed hyperlipidemia 08/16/2013  . Myocardial infarction ([place]) 03/2014 X 2?  [name] Type II diabetes mellitus (HCC)     Medications:  Medications Prior to Admission  Medication Sig Dispense Refill Last Dose  . amLODipine (NORVASC) 5 MG tablet Take 1 tablet (5 mg total) by mouth daily. 90 tablet 3  09/07/2017  . aspirin 81 MG tablet Take 81 mg by mouth daily.    09/08/2017 at Unknown time  . carvedilol (COREG) 6.25 MG tablet TAKE 1 TABLET BY MOUTH TWO  TIMES DAILY (Patient taking differently: TAKE 6.25 MG BY MOUTH TWO  TIMES DAILY) 180 tablet 2 09/08/2017 at 0700  . insulin glargine (LANTUS) 100 unit/mL SOPN Inject 30 Units into the skin daily.   09/08/2017 at 0700  . losartan (COZAAR) 50 MG tablet TAKE 1 TABLET BY MOUTH TWO  TIMES DAILY (Patient taking differently: TAKE 50 MG BY MOUTH TWO  TIMES DAILY) 180 tablet 2 09/07/2017 at Unknown time  . pantoprazole (PROTONIX) 40 MG tablet Take 1 tablet (40 mg total) by mouth daily. 90 tablet 1 09/08/2017 at 0700  . rosuvastatin (CRESTOR) 10 MG tablet Take 1 tablet (10 mg total) by mouth 3 (three) times a week. 36 tablet 3 09/07/2017 at Unknown time  . glucose blood test strip Use as directed. (Patient not taking: Reported on 09/06/2017) 100 each 5 Not Taking at Unknown time  . nitroGLYCERIN (NITROSTAT) 0.4 MG SL tablet Place 1 tablet (0.4 mg total) every 5 (five) minutes as needed under the tongue for chest pain. 90 tablet 3 Unknown at Unknown time  . ULTICARE MINI PEN NEEDLES 31G X 6 MM MISC USE AS DIRECTED (Patient  not taking: Reported on 09/06/2017) 100 each 0 Not Taking at Unknown time    Assessment: 72 yo M seen outpt by cards MD 2/25 with worsening exertional angina symptoms.  He presented 2/28 for diagnostic cath.  PCI/stent scheduled for 2/29.  Pharmacy asked to start heparin 8 hours after sheath removal.  Sheath removed at 1610.  Goal of Therapy:  Heparin level 0.3-0.7 units/ml Monitor platelets by anticoagulation protocol: Yes   Plan:  Heparin to start at midnight.  PCI/stent scheduled for 3/1. Heparin infusion at 1100 units/hr Heparin level at 0800 Daily heparin level and CBC ordered to start 3/2  [employer], Pharm.D., BCPS Clinical Pharmacist 09/08/2017 5:59 PM

## 2017-09-08 NOTE — Interval H&P Note (Signed)
Cath Lab Visit (complete for each Cath Lab visit)  Clinical Evaluation Leading to the Procedure:   ACS: No.  Non-ACS:    Anginal Classification: CCS III  Anti-ischemic medical therapy: Maximal Therapy (2 or more classes of medications)  Non-Invasive Test Results: Intermediate-risk stress test findings: cardiac mortality 1-3%/year  Prior CABG: Previous CABG      History and Physical Interval Note:  09/08/2017 1:53 PM  [name]  has presented today for surgery, with the diagnosis of abnormal ecg  The various methods of treatment have been discussed with the patient and family. After consideration of risks, benefits and other options for treatment, the patient has consented to  Procedure(s): LEFT HEART CATH AND CORS/GRAFTS ANGIOGRAPHY (N/A) as a surgical intervention .  The patient's history has been reviewed, patient examined, no change in status, stable for surgery.  I have reviewed the patient's chart and labs.  Questions were answered to the patient's satisfaction.     Shelva Majestic

## 2017-09-08 NOTE — Progress Notes (Signed)
Site area: Right groin a 5 french arterial sheath was removed  Site Prior to Removal:  Level 0  Pressure Applied For 25 MINUTES    Bedrest Beginning at 1610p  Manual:   Yes.    Patient Status During Pull:  stable  Post Pull Groin Site:  Level 0  Post Pull Instructions Given:  Yes.    Post Pull Pulses Present:  Yes.    Dressing Applied:  Yes.    Comments:  VS remain stable

## 2017-09-09 ENCOUNTER — Other Ambulatory Visit: Payer: Self-pay

## 2017-09-09 ENCOUNTER — Encounter (HOSPITAL_COMMUNITY)
Admission: AD | Disposition: A | Discharge status: Home / Self Care | Payer: Self-pay | Source: Ambulatory Visit | Attending: Cardiovascular Disease

## 2017-09-09 ENCOUNTER — Encounter (HOSPITAL_COMMUNITY): Payer: Self-pay | Admitting: Cardiovascular Disease

## 2017-09-09 DIAGNOSIS — I25710 Atherosclerosis of autologous vein coronary artery bypass graft(s) with unstable angina pectoris: Secondary | ICD-10-CM

## 2017-09-09 HISTORY — PX: CORONARY STENT INTERVENTION: CATH118234

## 2017-09-09 HISTORY — PX: CORONARY STENT PLACEMENT: SHX1402

## 2017-09-09 LAB — GLUCOSE, CAPILLARY
Glucose-Capillary: 114 mg/dL — ABNORMAL HIGH (ref 65–99)
Glucose-Capillary: 116 mg/dL — ABNORMAL HIGH (ref 65–99)
Glucose-Capillary: 124 mg/dL — ABNORMAL HIGH (ref 65–99)

## 2017-09-09 LAB — CBC
HCT: 40.1 % (ref 39.0–52.0)
Hemoglobin: 13.7 g/dL (ref 13.0–17.0)
MCH: 29.2 pg (ref 26.0–34.0)
MCHC: 34.2 g/dL (ref 30.0–36.0)
MCV: 85.5 fL (ref 78.0–100.0)
Platelets: 142 10*3/uL — ABNORMAL LOW (ref 150–400)
RBC: 4.69 MIL/uL (ref 4.22–5.81)
RDW: 12.9 % (ref 11.5–15.5)
WBC: 9.5 10*3/uL (ref 4.0–10.5)

## 2017-09-09 LAB — POCT ACTIVATED CLOTTING TIME: Activated Clotting Time: 560 seconds

## 2017-09-09 LAB — BASIC METABOLIC PANEL
Anion gap: 10 (ref 5–15)
BUN: 13 mg/dL (ref 6–20)
CO2: 22 mmol/L (ref 22–32)
Calcium: 8.8 mg/dL — ABNORMAL LOW (ref 8.9–10.3)
Chloride: 107 mmol/L (ref 101–111)
Creatinine, Ser: 1.19 mg/dL (ref 0.61–1.24)
GFR calc Af Amer: >60 mL/min (ref >60)
GFR calc non Af Amer: 60 mL/min — ABNORMAL LOW (ref >60)
Glucose, Bld: 126 mg/dL — ABNORMAL HIGH (ref 65–99)
Potassium: 3.6 mmol/L (ref 3.5–5.1)
Sodium: 139 mmol/L (ref 135–145)

## 2017-09-09 LAB — HEPARIN LEVEL (UNFRACTIONATED): Heparin Unfractionated: <0.1 IU/mL — ABNORMAL LOW (ref 0.30–0.70)

## 2017-09-09 SURGERY — CORONARY STENT INTERVENTION
Anesthesia: LOCAL

## 2017-09-09 MED ORDER — BIVALIRUDIN TRIFLUOROACETATE 250 MG IV SOLR
INTRAVENOUS | Status: AC
  Filled 2017-09-09: qty 250

## 2017-09-09 MED ORDER — LIDOCAINE HCL (PF) 1 % IJ SOLN
INTRAMUSCULAR | Status: DC | PRN
  Administered 2017-09-09: 15 mL

## 2017-09-09 MED ORDER — LABETALOL HCL 5 MG/ML IV SOLN: 10.0000 mg | INTRAVENOUS | Status: AC | PRN

## 2017-09-09 MED ORDER — PANTOPRAZOLE SODIUM 40 MG PO TBEC
40.0000 mg | DELAYED_RELEASE_TABLET | Freq: Every day | ORAL | Status: DC
  Administered 2017-09-09 – 2017-09-10 (×2): 40 mg via ORAL
  Filled 2017-09-09 (×2): qty 1

## 2017-09-09 MED ORDER — SODIUM CHLORIDE 0.9% FLUSH: 3.0000 mL | INTRAVENOUS | Status: DC | PRN

## 2017-09-09 MED ORDER — SODIUM CHLORIDE 0.9 % WEIGHT BASED INFUSION
3.0000 mL/kg/h | INTRAVENOUS | Status: DC
  Administered 2017-09-09: 3 mL/kg/h via INTRAVENOUS

## 2017-09-09 MED ORDER — VERAPAMIL HCL 2.5 MG/ML IV SOLN
INTRAVENOUS | Status: DC | PRN
  Administered 2017-09-09 (×4): 200 ug via INTRACORONARY

## 2017-09-09 MED ORDER — MIDAZOLAM HCL 2 MG/2ML IJ SOLN
INTRAMUSCULAR | Status: AC
  Filled 2017-09-09: qty 2

## 2017-09-09 MED ORDER — TICAGRELOR 90 MG PO TABS
90.0000 mg | ORAL_TABLET | Freq: Two times a day (BID) | ORAL | Status: DC
  Administered 2017-09-09 – 2017-09-10 (×2): 90 mg via ORAL
  Filled 2017-09-09 (×2): qty 1

## 2017-09-09 MED ORDER — SODIUM CHLORIDE 0.9 % IV SOLN: 250.0000 mL | INTRAVENOUS | Status: DC | PRN

## 2017-09-09 MED ORDER — MIDAZOLAM HCL 2 MG/2ML IJ SOLN
INTRAMUSCULAR | Status: DC | PRN
  Administered 2017-09-09 (×2): 1 mg via INTRAVENOUS

## 2017-09-09 MED ORDER — VERAPAMIL HCL 2.5 MG/ML IV SOLN
INTRAVENOUS | Status: AC
  Filled 2017-09-09: qty 2

## 2017-09-09 MED ORDER — SODIUM CHLORIDE 0.9 % WEIGHT BASED INFUSION: 3.0000 mL/kg/h | INTRAVENOUS | Status: DC

## 2017-09-09 MED ORDER — PANTOPRAZOLE SODIUM 40 MG PO TBEC: 40.0000 mg | DELAYED_RELEASE_TABLET | Freq: Every day | ORAL | Status: DC

## 2017-09-09 MED ORDER — BIVALIRUDIN BOLUS VIA INFUSION - CUPID
INTRAVENOUS | Status: DC | PRN
  Administered 2017-09-09: 59.475 mg via INTRAVENOUS

## 2017-09-09 MED ORDER — ROSUVASTATIN CALCIUM 20 MG PO TABS
40.0000 mg | ORAL_TABLET | Freq: Every day | ORAL | Status: DC
  Administered 2017-09-09: 40 mg via ORAL
  Filled 2017-09-09 (×3): qty 2

## 2017-09-09 MED ORDER — FENTANYL CITRATE (PF) 100 MCG/2ML IJ SOLN
INTRAMUSCULAR | Status: AC
  Filled 2017-09-09: qty 2

## 2017-09-09 MED ORDER — HEPARIN (PORCINE) IN NACL 2-0.9 UNIT/ML-% IJ SOLN
INTRAMUSCULAR | Status: AC | PRN
  Administered 2017-09-09 (×2): 500 mL

## 2017-09-09 MED ORDER — IOPAMIDOL (ISOVUE-370) INJECTION 76%
INTRAVENOUS | Status: DC | PRN
  Administered 2017-09-09: 85 mL via INTRA_ARTERIAL

## 2017-09-09 MED ORDER — FENTANYL CITRATE (PF) 100 MCG/2ML IJ SOLN
INTRAMUSCULAR | Status: DC | PRN
  Administered 2017-09-09 (×2): 25 ug via INTRAVENOUS

## 2017-09-09 MED ORDER — AMLODIPINE BESYLATE 5 MG PO TABS
5.0000 mg | ORAL_TABLET | Freq: Every day | ORAL | Status: DC
  Administered 2017-09-09: 5 mg via ORAL
  Filled 2017-09-09: qty 1

## 2017-09-09 MED ORDER — SODIUM CHLORIDE 0.9 % IV SOLN
INTRAVENOUS | Status: DC | PRN
  Administered 2017-09-09: 1.75 mg/kg/h via INTRAVENOUS

## 2017-09-09 MED ORDER — ASPIRIN 81 MG PO CHEW
81.0000 mg | CHEWABLE_TABLET | ORAL | Status: AC
  Administered 2017-09-09: 81 mg via ORAL
  Filled 2017-09-09: qty 1

## 2017-09-09 MED ORDER — SODIUM CHLORIDE 0.9 % IV SOLN
INTRAVENOUS | Status: AC
  Administered 2017-09-09: 10:00:00 via INTRAVENOUS

## 2017-09-09 MED ORDER — HYDRALAZINE HCL 20 MG/ML IJ SOLN: 5.0000 mg | INTRAMUSCULAR | Status: AC | PRN

## 2017-09-09 MED ORDER — SODIUM CHLORIDE 0.9 % WEIGHT BASED INFUSION
1.0000 mL/kg/h | INTRAVENOUS | Status: DC
  Administered 2017-09-09: 1 mL/kg/h via INTRAVENOUS

## 2017-09-09 MED ORDER — HEPARIN (PORCINE) IN NACL 2-0.9 UNIT/ML-% IJ SOLN
INTRAMUSCULAR | Status: AC
  Filled 2017-09-09: qty 500

## 2017-09-09 MED ORDER — TICAGRELOR 90 MG PO TABS
90.0000 mg | ORAL_TABLET | Freq: Once | ORAL | Status: AC
  Administered 2017-09-09: 11:00:00 90 mg via ORAL

## 2017-09-09 MED ORDER — SODIUM CHLORIDE 0.9% FLUSH
3.0000 mL | Freq: Two times a day (BID) | INTRAVENOUS | Status: DC
  Administered 2017-09-09: 3 mL via INTRAVENOUS

## 2017-09-09 MED ORDER — SODIUM CHLORIDE 0.9% FLUSH: 3.0000 mL | Freq: Two times a day (BID) | INTRAVENOUS | Status: DC

## 2017-09-09 MED ORDER — AMLODIPINE BESYLATE 5 MG PO TABS: 5.0000 mg | ORAL_TABLET | Freq: Every day | ORAL | Status: DC

## 2017-09-09 MED ORDER — ANGIOPLASTY BOOK
Freq: Once | Status: AC
  Administered 2017-09-09: 01:00:00
  Filled 2017-09-09: qty 1

## 2017-09-09 MED ORDER — LIDOCAINE HCL (PF) 1 % IJ SOLN
INTRAMUSCULAR | Status: AC
  Filled 2017-09-09: qty 30

## 2017-09-09 MED ORDER — SODIUM CHLORIDE 0.9 % WEIGHT BASED INFUSION
1.0000 mL/kg/h | INTRAVENOUS | Status: DC
Start: 2017-09-09 — End: 2017-09-09

## 2017-09-09 MED FILL — Heparin Sodium (Porcine) 2 Unit/ML in Sodium Chloride 0.9%: INTRAMUSCULAR | Qty: 1000 | Status: AC

## 2017-09-09 SURGICAL SUPPLY — 15 items
BALLN SAPPHIRE 2.0X20 (BALLOONS) ×2
BALLOON SAPPHIRE 2.0X20 (BALLOONS) IMPLANT
CATH LAUNCHER 6FR AL1 (CATHETERS) ×1 IMPLANT
CATHETER LAUNCHER 6FR AL1 (CATHETERS) ×2
DEVICE SPIDERFX EMB PROT 4MM (WIRE) ×1 IMPLANT
KIT ENCORE 26 ADVANTAGE (KITS) ×1 IMPLANT
KIT HEART LEFT (KITS) ×2 IMPLANT
PACK CARDIAC CATHETERIZATION (CUSTOM PROCEDURE TRAY) ×2 IMPLANT
SHEATH PINNACLE 6F 10CM (SHEATH) ×1 IMPLANT
STENT SYNERGY DES 3X24 (Permanent Stent) ×1 IMPLANT
STENT SYNERGY DES 4X28 (Permanent Stent) ×1 IMPLANT
TRANSDUCER W/STOPCOCK (MISCELLANEOUS) ×2 IMPLANT
TUBING CIL FLEX 10 FLL-RA (TUBING) ×2 IMPLANT
WIRE COUGAR XT STRL 190CM (WIRE) ×1 IMPLANT
WIRE EMERALD 3MM-J .035X150CM (WIRE) ×1 IMPLANT

## 2017-09-09 NOTE — Progress Notes (Signed)
Thanks, Chris 

## 2017-09-09 NOTE — Progress Notes (Signed)
Site area: right groin  Site Prior to Removal:  Level 1  Pressure Applied For 20 MINUTES    Minutes Beginning at 1140  Manual:   Yes.    Patient Status During Pull:  stable  Post Pull Groin Site:  Level 1  Post Pull Instructions Given:  Yes.    Post Pull Pulses Present:  Yes.    Dressing Applied:  Yes.    Bedrest began at 1200 til 1600 No change in assessment during shift.

## 2017-09-09 NOTE — Interval H&P Note (Signed)
History and Physical Interval Note:  09/09/2017 8:12 AM  [name]  has presented today for PCI of the SVG to OM. The various methods of treatment have been discussed with the patient and family. After consideration of risks, benefits and other options for treatment, the patient has consented to  Procedure(s): CORONARY STENT INTERVENTION (N/A) as a surgical intervention .  The patient's history has been reviewed, patient examined, no change in status, stable for surgery.  I have reviewed the patient's chart and labs.  Questions were answered to the patient's satisfaction.    Cath Lab Visit (complete for each Cath Lab visit)  Clinical Evaluation Leading to the Procedure:   ACS: No.  Non-ACS:    Anginal Classification: CCS III  Anti-ischemic medical therapy: Maximal Therapy (2 or more classes of medications)  Non-Invasive Test Results: No non-invasive testing performed  Prior CABG: Previous CABG         Lauree Chandler

## 2017-09-09 NOTE — Care Management Note (Addendum)
Case Management Note  Patient Details  Name: [name] MRN: [id number] Date of Birth: [birth year]  Subjective/Objective:   From home with wife, pta indep, s/p coronary stent intervention, will be on brilinta, NCM gave patient the 30 day free coupon.  His pharmacy at [facility] does not have in [place] , but [facility] in [place] does have brilinta in [place].  Informed patient of co pay of 45.00.                  Action/Plan: DC when medically ready.   Expected Discharge Date:                  Expected Discharge Plan:  Home/Self Care  In-House Referral:     Discharge planning Services  CM Consult, Medication Assistance  Post Acute Care Choice:    Choice offered to:     DME Arranged:    DME Agency:     HH Arranged:    HH Agency:     Status of Service:  Completed, signed off  If discussed at [employer] of Stay Meetings, dates discussed:    Additional Comments:  Zenon Mayo, RN 09/09/2017, 4:16 PM

## 2017-09-09 NOTE — Progress Notes (Signed)
Patient transferred to cath lab via bed with cath lab staff RN and transport. Heparin discontinued at 0800. Given Brilinta and aspirin at 557 pre cath. IV fluids infusing at 77.1, bolus completed.

## 2017-09-09 NOTE — Progress Notes (Signed)
#   1.  S/W [facility] @ [facility] # [phone]    BRILINTA 90 MG BID  COVER- YES  CO-PAY- $ 45.00  TIER- 3 DRUG  PRIOR APPROVAL- NO   PREFERRED PHARMACY : ANY [facility] AND [facility]

## 2017-09-10 ENCOUNTER — Encounter (HOSPITAL_COMMUNITY): Payer: Self-pay | Admitting: Cardiology

## 2017-09-10 DIAGNOSIS — Z95820 Peripheral vascular angioplasty status with implants and grafts: Secondary | ICD-10-CM

## 2017-09-10 DIAGNOSIS — R001 Bradycardia, unspecified: Secondary | ICD-10-CM

## 2017-09-10 HISTORY — DX: Peripheral vascular angioplasty status with implants and grafts: Z95.820

## 2017-09-10 HISTORY — DX: Bradycardia, unspecified: R00.1

## 2017-09-10 LAB — BASIC METABOLIC PANEL
Anion gap: 10 (ref 5–15)
BUN: 14 mg/dL (ref 6–20)
CO2: 22 mmol/L (ref 22–32)
Calcium: 8.9 mg/dL (ref 8.9–10.3)
Chloride: 107 mmol/L (ref 101–111)
Creatinine, Ser: 1.06 mg/dL (ref 0.61–1.24)
GFR calc Af Amer: >60 mL/min (ref >60)
GFR calc non Af Amer: >60 mL/min (ref >60)
Glucose, Bld: 129 mg/dL — ABNORMAL HIGH (ref 65–99)
Potassium: 3.7 mmol/L (ref 3.5–5.1)
Sodium: 139 mmol/L (ref 135–145)

## 2017-09-10 LAB — CBC
HCT: 40 % (ref 39.0–52.0)
Hemoglobin: 13.8 g/dL (ref 13.0–17.0)
MCH: 29.7 pg (ref 26.0–34.0)
MCHC: 34.5 g/dL (ref 30.0–36.0)
MCV: 86 fL (ref 78.0–100.0)
Platelets: 146 10*3/uL — ABNORMAL LOW (ref 150–400)
RBC: 4.65 MIL/uL (ref 4.22–5.81)
RDW: 13.1 % (ref 11.5–15.5)
WBC: 8.1 10*3/uL (ref 4.0–10.5)

## 2017-09-10 LAB — GLUCOSE, CAPILLARY: Glucose-Capillary: 127 mg/dL — ABNORMAL HIGH (ref 65–99)

## 2017-09-10 MED ORDER — ACETAMINOPHEN 325 MG PO TABS: 650.0000 mg | ORAL_TABLET | ORAL | Status: DC | PRN

## 2017-09-10 MED ORDER — TICAGRELOR 90 MG PO TABS: 90.0000 mg | ORAL_TABLET | Freq: Two times a day (BID) | ORAL | 11 refills | Status: DC

## 2017-09-10 MED ORDER — AMLODIPINE BESYLATE 10 MG PO TABS: 10.0000 mg | ORAL_TABLET | Freq: Every day | ORAL | 6 refills | Status: DC

## 2017-09-10 MED ORDER — TICAGRELOR 90 MG PO TABS: 90.0000 mg | ORAL_TABLET | Freq: Two times a day (BID) | ORAL | 1 refills | Status: DC

## 2017-09-10 MED ORDER — NITROGLYCERIN 0.4 MG SL SUBL: 0.4000 mg | SUBLINGUAL_TABLET | SUBLINGUAL | 3 refills | Status: DC | PRN

## 2017-09-10 MED ORDER — ROSUVASTATIN CALCIUM 10 MG PO TABS: 10.0000 mg | ORAL_TABLET | ORAL | 3 refills | Status: DC

## 2017-09-10 MED ORDER — AMLODIPINE BESYLATE 10 MG PO TABS
10.0000 mg | ORAL_TABLET | Freq: Every day | ORAL | Status: DC
  Administered 2017-09-10: 10:00:00 10 mg via ORAL
  Filled 2017-09-10: qty 1

## 2017-09-10 MED ORDER — AMLODIPINE BESYLATE 5 MG PO TABS: 5.0000 mg | ORAL_TABLET | Freq: Once | ORAL | Status: DC

## 2017-09-10 MED ORDER — ANGIOPLASTY BOOK
Freq: Once | Status: AC
  Administered 2017-09-10: 02:00:00 1
  Filled 2017-09-10: qty 1

## 2017-09-10 NOTE — Progress Notes (Signed)
Subjective:  Stable post PCI, EKG stable.  C/o mild amount of dyspnea since Brilinta  Objective:  Vital Signs in the last 24 hours: BP (!) 148/80 (BP Location: Left Arm)   Pulse (!) 58   Temp 98.1 F (36.7 C) (Oral)   Resp 20   Ht 5\' 9"  (1.753 m)   Wt 79.2 kg (174 lb 9.7 oz)   SpO2 96%   BMI 25.78 kg/m   Physical Exam: PLeasant WM in NAD Lungs:  Clear Cardiac:  Regular rhythm, normal S1 and S2, no S3 Extremities:  Groin with moderate ecchymosis  Intake/Output from previous day: 03/01 0701 - 03/02 0700 In: 810 [P.O.:360; I.V.:450] Out: 800 [Urine:800]  Weight Filed Weights   09/08/17 1139 09/09/17 0343 09/10/17 0402  Weight: 77.1 kg (170 lb) 79.3 kg (174 lb 13.2 oz) 79.2 kg (174 lb 9.7 oz)    Lab Results: Basic Metabolic Panel: Recent Labs    09/09/17 0310 09/10/17 0443  NA 139 139  K 3.6 3.7  CL 107 107  CO2 22 22  GLUCOSE 126* 129*  BUN 13 14  CREATININE 1.19 1.06   CBC: Recent Labs    09/09/17 0310 09/10/17 0443  WBC 9.5 8.1  HGB 13.7 13.8  HCT 40.1 40.0  MCV 85.5 86.0  PLT 142* 146*   Telemetry: Sinus rhythm occasional PVC's   EKG: Lateral ST changes unchanged  Assessment/Plan:  1. Stable post PCI 2. OK for d/c 3. Hypertension in house may need to increase carvedilol on d/c 4. Mild dyspnea on Brilinta - watch   W. Doristine Church MD [facility] 8:25 AM      Kerry Hough  MD [facility]  09/10/2017, 8:22 AM

## 2017-09-10 NOTE — Progress Notes (Signed)
CARDIAC REHAB PHASE I   PRE:  Rate/Rhythm: 43 SR  BP:   Sitting: 150/87     SaO2: 96% RA  MODE:  Ambulation: 342 ft   POST:  Rate/Rhythm: 72 SR  BP:   Sitting: 151/82     SaO2: 97% RA  902-955  Pt ambulated 342 with a somewhat steady gait and pace, with one person assist and use of hand rails in hallway. Pt does use a cane/walker at home from time to time. Pt did take 2 short rest breaks of 10-15 seconds due to foot pain (bone spurs). Pt returned to bedside with VSS. Pt concerned of high BP readings. Express to pt, morning meds were not in the system as he just took them prior to walk around 905. Education completed in which we thoroughly reviewed stent book, diabetic diet, HH diet and exercise guidelines. Further discussed emergency precautions and NTG use. Pt was given stent card and Brilinta info from nursing staff. Pt has a great understanding of activity limitations. Pt referred to [facility] at [facility].  Nabor Thomann D Marica Trentham,MS,ACSM-RCEP 09/10/2017 9:52 AM

## 2017-09-10 NOTE — Discharge Summary (Addendum)
Discharge Summary    Patient ID: [name],  MRN: [id number], DOB/AGE: [birth year] 72 y.o.  Admit date: 09/08/2017 Discharge date: 09/10/2017  Primary Care Provider: [name], [name] Primary Cardiologist: Sanda Klein, MD  Discharge Diagnoses    Principal Problem:   Unstable angina pectoris [facility]) Active Problems:   S/P angioplasty with stent DES mid SVG to OM and DES to prox. SVG to OM, DAPT for lifetime   CAD s/p CABG 2006   Type 2 diabetes mellitus with diabetic neuropathy, unspecified (HCC)   Hyperlipidemia, mixed   Hypertension   Chronic combined systolic and diastolic heart failure (HCC)   Carotid artery disease (HCC)   Bradycardia- with decrease of BB some improvement   Allergies Allergies  Allergen Reactions  . Codeine Nausea Only and Other (See Comments)    Cannot take on empty stomach.  . Valium Nausea Only and Other (See Comments)    Cannot take on empty stomach.    Diagnostic Studies/Procedures   cardiac cath 09/08/17  Conclusion     Ost RCA to Prox RCA lesion is 20% stenosed.  Mid RCA lesion is 15% stenosed.  2nd RPLB lesion is 80% stenosed.  1st RPLB lesion is 60% stenosed.  Ost Cx lesion is 95% stenosed.  Origin lesion is 50% stenosed.  Dist LM to Ost LAD lesion is 100% stenosed.  Prox Graft lesion is 75% stenosed.  Prox Graft to Mid Graft lesion is 85% stenosed.  Mid Graft to Dist Graft lesion is 95% stenosed.  Origin lesion is 100% stenosed.  Prox Cx to Mid Cx lesion is 100% stenosed.  LV end diastolic pressure is normal.   Severe multivessel native CAD with total ostial occlusion of the LAD, 95% ostial left circumflex stenosis followed by total proximal occlusion; widely patent stent in the ostial to proximal large dominan RCA with mild 20% intimal hyperplasia, irregularity of the mid RCA, 60% stenosis in the PDA and focal 80% stenosis in the PLA branch.  There is faint collateralization to the very distal circumflex from the  very distal RCA.  Old occluded vein graft which previously supplied the distal RCA.  Diffusely diseased previous sequential graft which had supplied the diagonal vessel and distal marginal vessel.  The diagonal anastomosis is occluded.  There is ostial eccentric 50% narrowing in the graft followed by 75 and 85% proximal stenoses before a valve.  Beyond the valve.  There is diffuse 95% eccentric stenoses in the graft.  Patent LIMA graft supplying the mid LAD.  RECOMMENDATION: Angiograms were reviewed with Dr. Burt Knack.  With the patent stent in the dominant RCA and patent LIMA graft supplying the LAD an attempt at complex intervention to the diffusely diseased vein graft supplying the distal marginal vessel was recommended.  The patient and family are aware of potential distal embolic complications.  The patient will be started on heparin and NTG this evening.  He will be hydrated.  Since he developed significant stenosis while on Plavix, he will be loaded with Brilinta 180 mg tonight with plans to initiate 90 twice a day tomorrow.  I notifed my colleague Dr. Julianne Handler who will review the films tomorrow prior to attempting this complex intervention.     Procedures  09/09/17   CORONARY STENT INTERVENTION  Conclusion   1. Unstable angina secondary to severe stenosis in the proximal and mid body of the SVG to the OM 2. Successful PTCA/DES x 1 mid body of SVG to OM 3. Successful PTCA/DES x  1 proximal body of SVG to OM  Recommendations: Continue DAPT for lifetime. Probable discharge home tomorrow.     _____________   History of Present Illness     72 year old male with CAD including CABG 2006 (LIMA-LAD, SVG x 3 to Diagonal, OM and PDA). Cardiac cath on 04/05/14 demonstrated worsening LVF with EF 35-40% and anterolateral and apical hypo-akinesis, S-PDA occluded, S-Dx/OM with prox 60% and dist limb occluded (to diagonal), native AVCFX with 95%, L-LAD patent. Native RCA with prox 95% stenosis  treated with BMS. Follow-up echocardiogram January 2016 shows normal left ventricular systolic function with ejection fraction of 55-60 %and a moderately dilated left atrium.  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 Kellie Simmering.   Has fatigue and bradycardia with higher doses of BB.  Has intolerance with statin, but has tolerated lower dose of crestor.  Just increased to 10 mg 3 X week but LDL not at goal.    On last visit with Dr. Sallyanne Kuster he complained of worsening exertional angina.  Pt has been unable to tolerate imdur so cardiac cath was planned.    Hospital Course     Consultants: none  Pt underwent cardiac cath on 09/08/17 with results as above.  He went back to the cath lab for PCI on 09/09/17 this was successful PTCA/DES to mid body of VG to OM and PTCA/DES to proximal body of VG-OM.    Since pt has developed significant stenosis while on plavix he was loaded with Brilinta and will continue on Brilinta.  Dr. Angelena Form recommended DAPT for lifetime.   Day of discharge pt did have some rectal smears of blood, Dr. Wynonia Lawman evaluated and believe to be hemorrhoids, pt will monitor for increased bleeding.  Pt also complained of Shortness of breath but mild we will continue Brilinta and monitor.  He is aware to call if this increases.    EF 40-45% on BB and ARB.   Pt seen and evaluated by Dr. Wynonia Lawman and found stable for discharge.  We did increase his amlodipine to 10 mg daily due to HTN.     _____________  Discharge Vitals Blood pressure (!) 148/80, pulse (!) 58, temperature 98.1 F (36.7 C), temperature source Oral, resp. rate 20, height 5\' 9"  (1.753 m), weight 174 lb 9.7 oz (79.2 kg), SpO2 96 %.  Filed Weights   09/08/17 1139 09/09/17 0343 09/10/17 0402  Weight: 170 lb (77.1 kg) 174 lb 13.2 oz (79.3 kg) 174 lb 9.7 oz (79.2 kg)    Labs & Radiologic Studies    CBC Recent Labs    09/09/17 0310 09/10/17 0443  WBC 9.5 8.1  HGB 13.7  13.8  HCT 40.1 40.0  MCV 85.5 86.0  PLT 142* 585*   Basic Metabolic Panel Recent Labs    09/09/17 0310 09/10/17 0443  NA 139 139  K 3.6 3.7  CL 107 107  CO2 22 22  GLUCOSE 126* 129*  BUN 13 14  CREATININE 1.19 1.06  CALCIUM 8.8* 8.9   Liver Function Tests No results for input(s): AST, ALT, ALKPHOS, BILITOT, PROT, ALBUMIN in the last 72 hours. No results for input(s): LIPASE, AMYLASE in the last 72 hours. Cardiac Enzymes No results for input(s): CKTOTAL, CKMB, CKMBINDEX, TROPONINI in the last 72 hours. BNP Invalid input(s): POCBNP D-Dimer No results for input(s): DDIMER in the last 72 hours. Hemoglobin A1C No results for input(s): HGBA1C in the last 72 hours. Fasting Lipid  Panel No results for input(s): CHOL, HDL, LDLCALC, TRIG, CHOLHDL, LDLDIRECT in the last 72 hours. Thyroid Function Tests No results for input(s): TSH, T4TOTAL, T3FREE, THYROIDAB in the last 72 hours.  Invalid input(s): FREET3 _____________  No results found. Disposition   Pt is being discharged home today in good condition.  Follow-up Plans & Appointments  Call [facility] at [phone] if any bleeding, swelling or drainage at cath site.  May shower, no tub baths for 48 hours for groin sticks. No lifting over 5 pounds for 3 days.  No Driving for 3 days.    Heart Healthy Diabetic Diet  Do not stop Brilinta or Asprin these help keep stent open.    If bleeding increases please call office for instructions   If shortness of breath increases or becomes more bothersome please call the office.     Follow-up Information    Croitoru, Mihai, MD Follow up.   Specialty:  Cardiology Why:  office will call and schedule appt within next 2 weeks call [facility] if you have not heard from them.  Contact information: [address] [place] [address] [phone]            Discharge Medications   Allergies as of 09/10/2017      Reactions   Codeine Nausea Only,  Other (See Comments)   Cannot take on empty stomach.   Valium Nausea Only, Other (See Comments)   Cannot take on empty stomach.      Medication List    TAKE these medications   acetaminophen 325 MG tablet Commonly known as:  TYLENOL Take 2 tablets (650 mg total) by mouth every 4 (four) hours as needed for headache or mild pain.   amLODipine 10 MG tablet Commonly known as:  NORVASC Take 1 tablet (10 mg total) by mouth daily. What changed:    medication strength  how much to take   aspirin 81 MG tablet Take 81 mg by mouth daily.   carvedilol 6.25 MG tablet Commonly known as:  COREG TAKE 1 TABLET BY MOUTH TWO  TIMES DAILY What changed:    how much to take  how to take this  when to take this   glucose blood test strip Use as directed.   insulin glargine 100 unit/mL Sopn Commonly known as:  LANTUS Inject 30 Units into the skin daily.   losartan 50 MG tablet Commonly known as:  COZAAR TAKE 1 TABLET BY MOUTH TWO  TIMES DAILY What changed:    how much to take  how to take this  when to take this   nitroGLYCERIN 0.4 MG SL tablet Commonly known as:  NITROSTAT Place 1 tablet (0.4 mg total) under the tongue every 5 (five) minutes as needed for chest pain.   pantoprazole 40 MG tablet Commonly known as:  PROTONIX Take 1 tablet (40 mg total) by mouth daily.   rosuvastatin 10 MG tablet Commonly known as:  CRESTOR Take 1 tablet (10 mg total) by mouth 4 (four) times a week. What changed:  when to take this   ticagrelor 90 MG Tabs tablet Commonly known as:  BRILINTA Take 1 tablet (90 mg total) by mouth 2 (two) times daily.   ULTICARE MINI PEN NEEDLES 31G X 6 MM Misc Generic drug:  Insulin Pen Needle USE AS DIRECTED        Aspirin prescribed at discharge?  Yes High Intensity Statin Prescribed? (Lipitor 40-80mg  or Crestor 20-40mg ): No: pt does not tolerate higher  dose - did increase from admit but if does not tolerate current dose may need repatha Beta  Blocker Prescribed? Yes For EF <40%, was ACEI/ARB Prescribed? YES ADP Receptor Inhibitor Prescribed? (i.e. Plavix etc.-Includes Medically Managed Patients): Yes For EF <40%, Aldosterone Inhibitor Prescribed? No: NA Was EF assessed during THIS hospitalization? Yes Was Cardiac Rehab II ordered? (Included Medically managed Patients): Yes   Outstanding Labs/Studies   BMP  Duration of Discharge Encounter   Greater than 30 minutes including physician time.  Signed, Cecilie Kicks NP 09/10/2017, 9:44 AM  Patient seen and examined and stable for discharge.  Kerry Hough MD [facility]

## 2017-09-10 NOTE — Discharge Instructions (Signed)
Call [facility] at [phone] if any bleeding, swelling or drainage at cath site.  May shower, no tub baths for 48 hours for groin sticks. No lifting over 5 pounds for 3 days.  No Driving for 3 days.    Heart Healthy Diabetic Diet  Do not stop Brilinta or Asprin these help keep stent open.    If bleeding increases please call office for instructions   If shortness of breath increases or becomes more bothersome please call the office.

## 2017-09-12 ENCOUNTER — Encounter (HOSPITAL_COMMUNITY): Payer: Medicare Other

## 2017-09-12 ENCOUNTER — Other Ambulatory Visit (HOSPITAL_COMMUNITY): Payer: Medicare Other

## 2017-09-12 ENCOUNTER — Ambulatory Visit: Payer: Medicare Other | Admitting: Family

## 2017-09-12 ENCOUNTER — Telehealth: Payer: Self-pay | Admitting: Cardiovascular Disease

## 2017-09-12 MED ORDER — AMLODIPINE BESYLATE 10 MG PO TABS: 10.0000 mg | ORAL_TABLET | Freq: Every day | ORAL | 3 refills | Status: DC

## 2017-09-12 MED ORDER — ROSUVASTATIN CALCIUM 10 MG PO TABS: 10.0000 mg | ORAL_TABLET | ORAL | 3 refills | Status: DC

## 2017-09-12 NOTE — Telephone Encounter (Signed)
Patient contacted regarding discharge from [facility] on 09/10/17.   Patient understands to follow up with provider K. Lawrence DNP on 09/27/17 at 10:30 AM at [facility].  Patient understands discharge instructions? yes  Patient understands medications and regiment? yes  Patient understands to bring all medications to this visit? yes   Medication list reviewed with patient and wife, verbalized understanding.   Medication samples have been provided to the patient.  Drug name: Brilinta 90 mg  Qty: 32    LOT: [id number]  Exp.Date: 9/21  Samples left at front desk for patient pick-up.   Wife also request rx for amlodipine and rosuvastatin be sent to [facility]-rx sent to pharmacy.

## 2017-09-12 NOTE — Telephone Encounter (Signed)
Called to schedule patient for 2 week post hospital visit.  He has questions regarding his medications.

## 2017-09-14 ENCOUNTER — Telehealth: Payer: Self-pay | Admitting: Cardiovascular Disease

## 2017-09-14 NOTE — Telephone Encounter (Signed)
Patient called back with the provider's recommendation. He has been instructed to call back if he has another episode and if he changes his mind about being seen sooner. He verbalized his understanding.

## 2017-09-14 NOTE — Telephone Encounter (Signed)
I do not think the Brilinta would be responsible for his syncope.  It sounds vasovagal.  Please make sure he is staying very well-hydrated.  Avoid sudden changes in position, take his time when he gets out of bed or off the commode.  If he does not want to come in on Monday, okay to keep the appointment for March 19. MCr

## 2017-09-14 NOTE — Telephone Encounter (Signed)
Returned the call to the patient. He was recently hospitalized with a cardiac cath procedure and stents. He stated that this morning around 4 am he got up to use the restroom and passed out. He denied shortness of breath or chest pain. He stated that he got nauseous and the next thing he knew he had passed out.   He stated that he feels fine today and denies any pain from the fall. His blood pressure was 155/88 and heart rate 68.  He wanted to know if the Brilinta could be causing this occurrence since this is a new medication. The patient was offered an appointment for Monday (soonest available) but declined. He stated that he will keep his appointment on 3/19 unless informed otherwise.

## 2017-09-14 NOTE — Telephone Encounter (Signed)
[name] is calling because [name] passed out on last night . Wants to know if the Brilinta can cause the passing out or What . Please call

## 2017-09-26 NOTE — Progress Notes (Signed)
Cardiology Office Note   Date:  09/27/2017   ID:  Steele, Scott 1945-11-22, MRN [id number]  PCP:  [name], [name], MD  Cardiologist: Dr. Sanda Klein   Chief Complaint  Patient presents with  . Dizziness  . Hospitalization Follow-up    cath     History of Present Illness: [name] is a 72 y.o. male who presents for ongoing assessment and management of coronary artery disease, last seen by Dr.Critoru on 09/05/2017 the patient was experiencing worsening angina occurring with minimal exertion, described as a burning sensation retrosternal area radiating to both arms and neck. He has a known history of coronary artery bypass grafting in 2006 and coronary artery stent in December 2015, symptoms are similar to those he experienced prior to the intervention.   Patient also only had cardiac catheterization on 09/08/2017, revealing severe multivessel native CAD with total occlusion of the LAD, 95% ostial left circumflex stenosis followed by total proximal occlusion; widely patent stent to the ostial to proximal large dominant RCA with mild 20% intimal hyperplasia, irregularity of the mid RCA, 60% stenosis in the PDA and focal 80% stenosis in the PLA branch. There was faint collateralization to a very distal circumflex from the very distal RCA.  It was recommended with the patent stent in the dominant RCA and patent LIMA graft supplying the LAD an attempt at complex intervention to the diffusely diseased vein graft supplying the distal marginal vessel.  On 09/09/2017, the patient had successful PTCA/drug-eluting stent 1 to the mid body of the SVG to OM; and successful PTCA/DES 1 to the proximal body of the SVG to OM. The patient was recommended to continue dual antiplatelet therapy for life.  He comes today with a lengthy list of questions, somatic complaints, and need for specific alteration on his catheterization and stent placement. Complaints of significant irritability, lack of sleep,  has had 2 episodes of syncope, one occurring right after coming home from catheterization landing on the bathroom floor where is found him the following morning.  He is aggravated that he cannot do his normal routine of mowing the lawn, driving, and is unhappy with his medication regimen. He states "it does not make me feel right in my head". He states he has become more forgetful. Also has multiple places on his skin that he wants me to evaluate.  Past Medical History:  Diagnosis Date  . Arthritis    "back" (04/05/2014)  . Atrial fibrillation ([place])   . Blood in stool   . Bradycardia- with decrease of BB some improvement 09/10/2017  . CAD (coronary artery disease)   . 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 Dr Darcey Nora - to LAD, SVG to diagonal, SVG to OM, SVG to PDA ;  b.  [facility] (9/15): ostial LAD occl, ostial CFX 60%, prox AVCFX 95%, RCA 95%, S-PDA occl, S-OM/Dx with Dx limb occl and prox 60%, L-LAD patent, EF 35-40% wtih ant-lat and apical HK-AK >> PCI:  BMS to native RCA   . Diverticulosis   . Dizziness   . DM type 2 causing complication ([place]) 03/18/6733  . GERD (gastroesophageal reflux disease)   . Hypertension   . Iliac artery aneurysm ([place])   . Ischemic cardiomyopathy    a. EF 35-40% by LV gram 03/2014  . Joint pain   . Kidney stones   . Kidney stones   . Lower back pain Jan 2015  . Mixed hyperlipidemia 08/16/2013  . Myocardial infarction ([place]) 03/2014 X  2?  . S/P angioplasty with stent DES mid SVG to OM and DES to prox. SVG to OM, DAPT for lifetime 09/10/2017  . Type II diabetes mellitus ([place])     Past Surgical History:  Procedure Laterality Date  . CARDIAC CATHETERIZATION  2006   "before [facility]"  . CORONARY ANGIOPLASTY WITH STENT PLACEMENT  04/05/2014   "1"  . CORONARY ARTERY BYPASS GRAFT  2006   LIMA to LAD,SVG to diagonal,SVG to obtuse marginal,SVG to posterior descending  . CORONARY STENT INTERVENTION N/A 09/09/2017   Procedure: CORONARY STENT INTERVENTION;  Surgeon: Burnell Blanks, MD;  Location: [facility];  Service: Cardiovascular;  Laterality: N/A;  . CORONARY STENT PLACEMENT  09/09/2017  . CYSTOSCOPY W/ STONE MANIPULATION  1970's X 2  . CYSTOSCOPY W/ URETEROSCOPY W/ LITHOTRIPSY  ~ 2009  . ILIAC ARTERY STENT Left Aug. 10, 2011   Aorta to right ext iliac and left CIA stent repair   . LEFT HEART CATH AND CORS/GRAFTS ANGIOGRAPHY N/A 09/08/2017   Procedure: LEFT HEART CATH AND CORS/GRAFTS ANGIOGRAPHY;  Surgeon: [name], MD;  Location: [facility];  Service: Cardiovascular;  Laterality: N/A;  . LEFT HEART CATHETERIZATION WITH CORONARY/GRAFT ANGIOGRAM N/A 04/05/2014   Procedure: LEFT HEART CATHETERIZATION WITH [name];  Surgeon: [name], MD;  Location: [facility];  Service: Cardiovascular;  Laterality: N/A;  . NM MYOCAR PERF WALL MOTION  11/18/2009   low risk  . TONSILLECTOMY AND ADENOIDECTOMY  1951     Current Outpatient Medications  Medication Sig Dispense Refill  . amLODipine (NORVASC) 10 MG tablet Take 0.5 tablets (5 mg total) by mouth daily. 90 tablet 3  . aspirin 81 MG tablet Take 81 mg by mouth daily.     . carvedilol (COREG) 6.25 MG tablet TAKE 1 TABLET BY MOUTH TWO  TIMES DAILY (Patient taking differently: TAKE 6.25 MG BY MOUTH TWO  TIMES DAILY) 180 tablet 2  . insulin glargine (LANTUS) 100 unit/mL SOPN Inject 30 Units into the skin daily.    [name] losartan (COZAAR) 50 MG tablet TAKE 1 TABLET BY MOUTH TWO  TIMES DAILY (Patient taking differently: TAKE 50 MG BY MOUTH TWO  TIMES DAILY) 180 tablet 2  . nitroGLYCERIN (NITROSTAT) 0.4 MG SL tablet Place 1 tablet (0.4 mg total) under the tongue every 5 (five) minutes as needed for chest pain. 90 tablet 3  . pantoprazole (PROTONIX) 40 MG tablet Take 1 tablet (40 mg total) by mouth daily. 90 tablet 1  . rosuvastatin (CRESTOR) 10 MG tablet Take 1 tablet (10 mg total) by mouth 4 (four) times a week. 44 tablet 3  . ticagrelor (BRILINTA) 90 MG TABS tablet Take 1 tablet (90 mg  total) by mouth 2 (two) times daily. 60 tablet 1  . ULTICARE MINI PEN NEEDLES 31G X 6 MM MISC USE AS DIRECTED 100 each 0   No current facility-administered medications for this visit.     Allergies:   Codeine and Valium    Social History:  The patient  reports that he quit smoking about 2 weeks ago. His smoking use included cigars. He quit after 2.00 years of use. he has never used smokeless tobacco. He reports that he does not drink alcohol or use drugs.   Family History:  The patient's family history includes Diabetes in his brother and mother; Heart disease in his mother, sister, and son; Hypertension in his mother.    ROS: All other systems are reviewed and negative.  Unless otherwise mentioned in H&P    PHYSICAL EXAM: VS:  BP 130/76   Pulse 62   Ht 5\' 9"  (1.753 m)   Wt 173 lb 12.8 oz (78.8 kg)   BMI 25.67 kg/m  , BMI Body mass index is 25.67 kg/m. GEN: Well nourished, well developed, in no acute distress  HEENT: normal  Neck: no JVD, carotid bruits, or masses Cardiac: RRR; no murmurs, rubs, or gallops,no edema  Respiratory:  clear to auscultation bilaterally, normal work of breathing GI: soft, nontender, nondistended, + BS MS: no deformity or atrophy  Skin: warm and dry, no rash. He does have what appears to be blood-filled blister on his lower left back likely from when he fell. He scratching it. Cardiac cath site in the right groin is well-healed without significant ecchymosis or bleeding. Does have some bruising on his arms, although not significant. Neuro:  Strength and sensation are intact Psych: euthymic mood, full affect   EKG:  Normal sinus rhythm, prolonged QT interval of 495 ms, heart rate 62 bpm.  Recent Labs: 09/10/2017: BUN 14; Creatinine, Ser 1.06; Hemoglobin 13.8; Platelets 146; Potassium 3.7; Sodium 139    Lipid Panel    Component Value Date/Time   CHOL 170 09/05/2017 1201   TRIG 183 (H) 09/05/2017 1201   HDL 29 (L) 09/05/2017 1201   CHOLHDL 5.9 (H)  09/05/2017 1201   CHOLHDL 5 12/28/2016 0936   VLDL 53.0 (H) 12/28/2016 0936   LDLCALC 104 (H) 09/05/2017 1201   LDLDIRECT 74.0 12/28/2016 0936      Wt Readings from Last 3 Encounters:  09/27/17 173 lb 12.8 oz (78.8 kg)  09/10/17 174 lb 9.7 oz (79.2 kg)  09/05/17 174 lb 6.4 oz (79.1 kg)      Other studies Reviewed: Cardiac Cath 09/08/2017 Left Main  Dist LM to Ost LAD lesion 100% stenosed  Dist LM to Ost LAD lesion is 100% stenosed.  Left Circumflex  Ost Cx lesion 95% stenosed  Ost Cx lesion is 95% stenosed.  Prox Cx to Mid Cx lesion 100% stenosed  Prox Cx to Mid Cx lesion is 100% stenosed.  Right Coronary Artery  Ost RCA to Prox RCA lesion 20% stenosed  Ost RCA to Prox RCA lesion is 20% stenosed. The lesion was previously treated.  Mid RCA lesion 15% stenosed  Mid RCA lesion is 15% stenosed.  First Right Posterolateral  1st RPLB lesion 60% stenosed  1st RPLB lesion is 60% stenosed.  Second Right Posterolateral  2nd RPLB lesion 80% stenosed  2nd RPLB lesion is 80% stenosed.  LIMA Graft to Mid LAD  Graft to Dist Cx  Origin lesion 50% stenosed  Origin lesion is 50% stenosed.  Prox Graft lesion 75% stenosed  Prox Graft lesion is 75% stenosed.  Prox Graft to Mid Graft lesion 85% stenosed  Prox Graft to Mid Graft lesion is 85% stenosed.  Mid Graft to Dist Graft lesion 95% stenosed  Mid Graft to Dist Graft lesion is 95% stenosed.  Graft to Dist RCA  Origin lesion 100% stenosed  Origin lesion is 100% stenosed.  Intervention  No interventions have been documented.   CORONARY STENT INTERVENTION  09/09/2017  1. Unstable angina secondary to severe stenosis in the proximal and mid body of the SVG to the OM 2. Successful PTCA/DES x 1 mid body of SVG to OM 3. Successful PTCA/DES x 1 proximal body of SVG to OM  Recommendations: Continue DAPT for lifetime. Probable discharge home tomorrow.     ASSESSMENT  AND PLAN:  1. Coronary artery disease: Prior stent to the right  coronary artery, stent to the mid body of the SVG to OM and proximal body of the SVG to OM. Total of 3 stents in situ. I explained his cardiac catheterization 2 and stent placement, when over the catheterization  illustration which I also printed and sent home with him.  I have answered multiple questions about his heart disease, medications and symptoms. I have also answered multiple questions from his wife who is with him.   2. Dizziness with Syncope: He has been having these spells prior to cardiac catheterization. He states that he had 2 episodes since coming home from the hospital. I did orthostatic blood pressures which were not found to be positive, but his blood pressure has been running low 850 systolic to 277 systolic. I'm going to decrease his amlodipine from 10 mg daily to 7.5 mg daily. I've also advised him not to get suddenly from the bed or a seated position, but didn't take his time to avoid dizziness and falling. Going to check a CBC and a BMET to evaluate for dehydration and any function.  3. Diabetes: He complains that his blood sugar is not being as well-controlled as prior to catheterization. I explained carvedilol can cause some elevation of blood glucose. He will need to follow-up with his primary care for ongoing management of insulin dosing.  4. Irritability and insomnia: He speaks at length about this. His wife has sleep apnea and wears a CPAP which is keeping him awake. I have suggested that he sleeps in a different room to allow for last interruption or noise. Also related some of this irritability has been related to the fact that he has not been able to be more active  outside working in his yard. He enjoys doing so and I advised him that he can now return to that activity. If this persists he may titrate to his PCP about medication assistance or psychological counseling  Current medicines are reviewed at length which are present in all for with the patient today.  I have   spent greater than 45 minutes with this patient and his wife answering multiple questions, educating, and listing to multiple somatic compliants. Medication  adjustments as above.   Labs/ tests ordered today include:  CBC, BMET  Phill Myron. West Pugh, ANP, AACC   09/27/2017 12:42 PM    [facility] [address] [place], [place] [address] Phone: [phone]; Fax: [phone]

## 2017-09-27 ENCOUNTER — Ambulatory Visit: Payer: Medicare Other | Admitting: Adult Health

## 2017-09-27 ENCOUNTER — Encounter: Payer: Self-pay | Admitting: Adult Health

## 2017-09-27 VITALS — BP 130/76 | HR 62 | Ht 69.0 in | Wt 173.8 lb

## 2017-09-27 DIAGNOSIS — G4700 Insomnia, unspecified: Secondary | ICD-10-CM | POA: Diagnosis not present

## 2017-09-27 DIAGNOSIS — I480 Paroxysmal atrial fibrillation: Secondary | ICD-10-CM

## 2017-09-27 DIAGNOSIS — I2510 Atherosclerotic heart disease of native coronary artery without angina pectoris: Secondary | ICD-10-CM | POA: Diagnosis not present

## 2017-09-27 DIAGNOSIS — Z79899 Other long term (current) drug therapy: Secondary | ICD-10-CM

## 2017-09-27 DIAGNOSIS — I10 Essential (primary) hypertension: Secondary | ICD-10-CM

## 2017-09-27 LAB — BASIC METABOLIC PANEL
BUN/Creatinine Ratio: 14 (ref 10–24)
BUN: 16 mg/dL (ref 8–27)
CO2: 23 mmol/L (ref 20–29)
Calcium: 9.3 mg/dL (ref 8.6–10.2)
Chloride: 102 mmol/L (ref 96–106)
Creatinine, Ser: 1.13 mg/dL (ref 0.76–1.27)
GFR calc Af Amer: 75 mL/min/{1.73_m2} (ref >59)
GFR calc non Af Amer: 65 mL/min/{1.73_m2} (ref >59)
Glucose: 119 mg/dL — ABNORMAL HIGH (ref 65–99)
Potassium: 4.1 mmol/L (ref 3.5–5.2)
Sodium: 139 mmol/L (ref 134–144)

## 2017-09-27 LAB — CBC
Hematocrit: 40.9 % (ref 37.5–51.0)
Hemoglobin: 14.6 g/dL (ref 13.0–17.7)
MCH: 30 pg (ref 26.6–33.0)
MCHC: 35.7 g/dL (ref 31.5–35.7)
MCV: 84 fL (ref 79–97)
Platelets: 189 10*3/uL (ref 150–379)
RBC: 4.87 x10E6/uL (ref 4.14–5.80)
RDW: 13.1 % (ref 12.3–15.4)
WBC: 8.5 10*3/uL (ref 3.4–10.8)

## 2017-09-27 MED ORDER — AMLODIPINE BESYLATE 10 MG PO TABS: 5.0000 mg | ORAL_TABLET | Freq: Every day | ORAL | 3 refills | Status: DC

## 2017-09-27 NOTE — Patient Instructions (Signed)
Medication Instructions:  .DECREASE AMLODIPINE 5MG  DAILY (1/2 TABLET)  If you need a refill on your cardiac medications before your next appointment, please call your pharmacy.  Labwork: CBC,BMET TODAY HERE IN OUR OFFICE AT [facility]  Take the provided lab slips for you to take with you to the lab for you blood draw.   Follow-Up: Your physician wants you to follow-up in: [place].   Thank you for choosing [facility] at [facility]!!

## 2017-09-27 NOTE — Progress Notes (Signed)
As we discussed, reduce amlodipine to only 5 mg once daily and try to gradually wean off this completely. Thanks [employer]

## 2017-10-17 ENCOUNTER — Other Ambulatory Visit: Payer: Self-pay | Admitting: Family Medicine

## 2017-10-18 DIAGNOSIS — E119 Type 2 diabetes mellitus without complications: Secondary | ICD-10-CM | POA: Diagnosis not present

## 2017-10-18 DIAGNOSIS — H2513 Age-related nuclear cataract, bilateral: Secondary | ICD-10-CM | POA: Diagnosis not present

## 2017-10-18 DIAGNOSIS — H52203 Unspecified astigmatism, bilateral: Secondary | ICD-10-CM | POA: Diagnosis not present

## 2017-10-18 DIAGNOSIS — H5203 Hypermetropia, bilateral: Secondary | ICD-10-CM | POA: Diagnosis not present

## 2017-10-28 ENCOUNTER — Telehealth: Payer: Self-pay | Admitting: Cardiovascular Disease

## 2017-10-28 NOTE — Telephone Encounter (Signed)
Patient aware samples are at the front desk for pick up  

## 2017-10-28 NOTE — Telephone Encounter (Signed)
New message    Patient calling the office for samples of medication:   1.  What medication and dosage are you requesting samples for?ticagrelor (BRILINTA) 90 MG TABS tablet  2.  Are you currently out of this medication? No, enough to last the weekend

## 2017-11-09 ENCOUNTER — Telehealth: Payer: Self-pay | Admitting: Cardiovascular Disease

## 2017-11-09 NOTE — Telephone Encounter (Signed)
Spoke with Pt's wife and advised samples are available for pick up at the front desk.  Brilinta 90 mg Qty: 4 bottles Lot# [id number] Expires: 1/22

## 2017-11-09 NOTE — Telephone Encounter (Signed)
Pt's wife calling.   Pt takes Brilinta and he is needing samples

## 2017-11-18 ENCOUNTER — Other Ambulatory Visit: Payer: Self-pay | Admitting: *Deleted

## 2017-11-18 MED ORDER — AMLODIPINE BESYLATE 10 MG PO TABS: 5.0000 mg | ORAL_TABLET | Freq: Every day | ORAL | 3 refills | Status: DC

## 2017-11-19 ENCOUNTER — Other Ambulatory Visit: Payer: Self-pay | Admitting: Family Medicine

## 2017-11-19 DIAGNOSIS — R110 Nausea: Secondary | ICD-10-CM

## 2017-11-28 ENCOUNTER — Ambulatory Visit: Payer: Medicare Other | Admitting: Cardiovascular Disease

## 2017-11-28 ENCOUNTER — Encounter: Payer: Self-pay | Admitting: Cardiovascular Disease

## 2017-11-28 VITALS — BP 130/76 | HR 57 | Ht 69.0 in | Wt 179.8 lb

## 2017-11-28 DIAGNOSIS — D649 Anemia, unspecified: Secondary | ICD-10-CM

## 2017-11-28 DIAGNOSIS — R001 Bradycardia, unspecified: Secondary | ICD-10-CM

## 2017-11-28 DIAGNOSIS — I5032 Chronic diastolic (congestive) heart failure: Secondary | ICD-10-CM | POA: Diagnosis not present

## 2017-11-28 DIAGNOSIS — I25810 Atherosclerosis of coronary artery bypass graft(s) without angina pectoris: Secondary | ICD-10-CM

## 2017-11-28 DIAGNOSIS — I480 Paroxysmal atrial fibrillation: Secondary | ICD-10-CM | POA: Diagnosis not present

## 2017-11-28 DIAGNOSIS — I10 Essential (primary) hypertension: Secondary | ICD-10-CM

## 2017-11-28 DIAGNOSIS — W57XXXA Bitten or stung by nonvenomous insect and other nonvenomous arthropods, initial encounter: Secondary | ICD-10-CM

## 2017-11-28 DIAGNOSIS — E119 Type 2 diabetes mellitus without complications: Secondary | ICD-10-CM

## 2017-11-28 DIAGNOSIS — E785 Hyperlipidemia, unspecified: Secondary | ICD-10-CM | POA: Diagnosis not present

## 2017-11-28 DIAGNOSIS — T50905A Adverse effect of unspecified drugs, medicaments and biological substances, initial encounter: Secondary | ICD-10-CM

## 2017-11-28 DIAGNOSIS — R5383 Other fatigue: Secondary | ICD-10-CM

## 2017-11-28 DIAGNOSIS — I723 Aneurysm of iliac artery: Secondary | ICD-10-CM | POA: Diagnosis not present

## 2017-11-28 MED ORDER — CARVEDILOL 3.125 MG PO TABS: 3.1250 mg | ORAL_TABLET | Freq: Two times a day (BID) | ORAL | 3 refills | Status: DC

## 2017-11-28 NOTE — Progress Notes (Signed)
Patient ID: [name], male   DOB: January 24, 1946, 72 y.o.   MRN: [id number]    Cardiology Office Note    Date:  11/28/2017   ID:  [name] 05-Dec-1945, MRN [id number]  PCP:  [name], [name], MD  Cardiologist:   Sanda Klein, MD   Chief Complaint  Patient presents with  . Follow-up    CAD    History of Present Illness:  [name] is a 72 y.o. male with a long-standing history of coronary artery disease who returns for follow-up.  He underwent four-vessel bypass surgery in 2006 (LIMA-LAD, SVG x 3 to Diagonal, OM and PDA). In 2015 received a bare metal stent to the proximal RCA (4.5 mm vessel). On September 08, 2017 he underwent cardiac catheterization which showed a long segment of severe stenosis in the SVG to OM (chronically occluded circumflex), patent LIMA to occluded LAD proximal RCA stent with 80% stenosis in the distal branch of the RCA.  March 1 he underwent revascularization of the SVG-OM with 2 drug-eluting stents.  Since the stents were placed he has not had any problems with angina which he describes as a severe burning sensation in the retrosternal area radiating right side of his neck.  However, he feels poorly.  He describes immediate fatigue with any activity but also describes the absence of any desire to do activity.  Previously enjoyed working outside, but has not had the desire to [place]ood[place] his lawn.  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  He does not complain of dyspnea.  Just "gives out".  He feels that he is not as mentally sharp as before and that he forgets easily.  He wonders whether this is a side effect from his medications.  Denies heat/cold intolerance or change in bowel pattern.  He has not had syncope or dizziness.  Glycemic control has deteriorated.  He is not sure he can muster the energy to go and see his vascular surgeon later this week.  We have had to reduce his  beta-blocker dose in the past due to symptomatic bradycardia.  Past rosuvastatin cause myalgia and we gradually it as a weekly, then twice weekly and then 3 times weekly medication.  He has stopped it again since he feels poorly.  He said a couple of recent tick bites, both of them on his left knee.  Neither 1 of them caused a rash.  He did not take antibiotics.  The first 1 happened about 3 weeks ago.  He requests a Lyme disease evaluation.  He has received a stent in the large right common iliac artery aneurysm and is monitored by Dr. Kellie Simmering. He has insulin requiring diabetes mellitus. He has chronically low HDL cholesterol, around 27 mg/dL.  Past Medical History:  Diagnosis Date  . Arthritis    "back" (04/05/2014)  . Atrial fibrillation ([place])   . Blood in stool   . Bradycardia- with decrease of BB some improvement 09/10/2017  . CAD (coronary artery disease)   . 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 Dr Darcey Nora - to LAD, SVG to diagonal, SVG to OM, SVG to PDA ;  b.  [facility]HC (9/15): ostial LAD occl, ostial CFX 60%, prox AVCFX 95%, RCA 95%, S-PDA occl, S-OM/Dx with Dx limb occl and prox 60%, L-LAD patent, EF 35-40% wtih ant-lat and apical HK-AK >> PCI:  BMS to  native RCA   . Diverticulosis   . Dizziness   . DM type 2 causing complication ([place]) 10/12/5954  . GERD (gastroesophageal reflux disease)   . Hypertension   . Iliac artery aneurysm ([place])   . Ischemic cardiomyopathy    a. EF 35-40% by LV gram 03/2014  . Joint pain   . Kidney stones   . Kidney stones   . Lower back pain Jan 2015  . Mixed hyperlipidemia 08/16/2013  . Myocardial infarction ([place]) 03/2014 X 2?  . S/P angioplasty with stent DES mid SVG to OM and DES to prox. SVG to OM, DAPT for lifetime 09/10/2017  . Type II diabetes mellitus ([place])     Past Surgical History:  Procedure Laterality Date  . CARDIAC CATHETERIZATION  2006   "before OHS"  . CORONARY ANGIOPLASTY WITH STENT PLACEMENT  04/05/2014   "1"  . CORONARY ARTERY BYPASS GRAFT  2006    LIMA to LAD,SVG to diagonal,SVG to obtuse marginal,SVG to posterior descending  . CORONARY STENT INTERVENTION N/A 09/09/2017   Procedure: CORONARY STENT INTERVENTION;  Surgeon: Burnell Blanks, MD;  Location: [facility];  Service: Cardiovascular;  Laterality: N/A;  . CORONARY STENT PLACEMENT  09/09/2017  . CYSTOSCOPY W/ STONE MANIPULATION  1970's X 2  . CYSTOSCOPY W/ URETEROSCOPY W/ LITHOTRIPSY  ~ 2009  . ILIAC ARTERY STENT Left Aug. 10, 2011   Aorta to right ext iliac and left CIA stent repair   . LEFT HEART CATH AND CORS/GRAFTS ANGIOGRAPHY N/A 09/08/2017   Procedure: LEFT HEART CATH AND CORS/GRAFTS ANGIOGRAPHY;  Surgeon: [name], MD;  Location: [facility];  Service: Cardiovascular;  Laterality: N/A;  . LEFT HEART CATHETERIZATION WITH CORONARY/GRAFT ANGIOGRAM N/A 04/05/2014   Procedure: LEFT HEART CATHETERIZATION WITH [name];  Surgeon: [name], MD;  Location: [facility];  Service: Cardiovascular;  Laterality: N/A;  . NM MYOCAR PERF WALL MOTION  11/18/2009   low risk  . TONSILLECTOMY AND ADENOIDECTOMY  1951    Outpatient Medications Prior to Visit  Medication Sig Dispense Refill  . amLODipine (NORVASC) 10 MG tablet Take 0.5 tablets (5 mg total) by mouth daily. 90 tablet 3  . aspirin 81 MG tablet Take 81 mg by mouth daily.     . insulin glargine (LANTUS) 100 unit/mL SOPN Inject 30 Units into the skin daily.    [name] LANTUS SOLOSTAR 100 UNIT/ML Solostar Pen INJECT 30 UNITS SQ ONCE DAILY IN THE MORNING 9 mL 0  . losartan (COZAAR) 50 MG tablet TAKE 1 TABLET BY MOUTH TWO  TIMES DAILY (Patient taking differently: TAKE 50 MG BY MOUTH TWO  TIMES DAILY) 180 tablet 2  . nitroGLYCERIN (NITROSTAT) 0.4 MG SL tablet Place 1 tablet (0.4 mg total) under the tongue every 5 (five) minutes as needed for chest pain. 90 tablet 3  . pantoprazole (PROTONIX) 40 MG tablet Take 1 tablet (40 mg total) by mouth daily. 90 tablet 1  . rosuvastatin (CRESTOR) 10 MG  tablet Take 1 tablet (10 mg total) by mouth 4 (four) times a week. 44 tablet 3  . ticagrelor (BRILINTA) 90 MG TABS tablet Take 1 tablet (90 mg total) by mouth 2 (two) times daily. 60 tablet 1  . ULTICARE MINI PEN NEEDLES 31G X 6 MM MISC USE AS DIRECTED 100 each 0  . carvedilol (COREG) 6.25 MG tablet TAKE 1 TABLET BY MOUTH TWO  TIMES DAILY (Patient taking differently: TAKE 6.25 MG BY MOUTH TWO  TIMES DAILY) 180  tablet 2   No facility-administered medications prior to visit.      Allergies:   Codeine and Valium   Social History   Socioeconomic History  . Marital status: Married    Spouse name: Not on file  . Number of children: Not on file  . Years of education: Not on file  . Highest education level: Not on file  Occupational History  . Not on file  Social Needs  . Financial resource strain: Not on file  . Food insecurity:    Worry: Not on file    Inability: Not on file  . Transportation needs:    Medical: Not on file    Non-medical: Not on file  Tobacco Use  . Smoking status: Former Smoker    Years: 2.00    Types: Cigars    Last attempt to quit: 09/09/2017    Years since quitting: 0.2  . Smokeless tobacco: Never Used  Substance and Sexual Activity  . Alcohol use: No  . Drug use: No  . Sexual activity: Yes  Lifestyle  . Physical activity:    Days per week: Not on file    Minutes per session: Not on file  . Stress: Not on file  Relationships  . Social connections:    Talks on phone: Not on file    Gets together: Not on file    Attends religious service: Not on file    Active member of club or organization: Not on file    Attends meetings of clubs or organizations: Not on file    Relationship status: Not on file  Other Topics Concern  . Not on file  Social History Narrative  . Not on file     Family History:  The patient's family history includes Diabetes in his brother and mother; Heart disease in his mother, sister, and son; Hypertension in his mother.   ROS:    Please see the history of present illness.    ROS All other systems reviewed and are negative.   PHYSICAL EXAM:   VS:  BP 130/76   Pulse (!) 57   Ht 5\' 9"  (1.753 m)   Wt 179 lb 12.8 oz (81.6 kg)   SpO2 97%   BMI 26.55 kg/m     General: Alert, oriented x3, no distress, mildly overweight.  He looks a little pale Head: no evidence of trauma, PERRL, EOMI, no exophtalmos or lid lag, no myxedema, no xanthelasma; normal ears, nose and oropharynx Neck: normal jugular venous pulsations and no hepatojugular reflux; brisk carotid pulses without delay and no carotid bruits Chest: clear to auscultation, no signs of consolidation by percussion or palpation, normal fremitus, symmetrical and full respiratory excursions Cardiovascular: normal position and quality of the apical impulse, regular rhythm, normal first and second heart sounds, no murmurs, rubs or gallops Abdomen: no tenderness or distention, no masses by palpation, no abnormal pulsatility or arterial bruits, normal bowel sounds, no hepatosplenomegaly Extremities: no clubbing, cyanosis or edema; 2+ radial, ulnar and brachial pulses bilaterally; 2+ right femoral, posterior tibial and dorsalis pedis pulses; 2+ left femoral, posterior tibial and dorsalis pedis pulses; no subclavian or femoral bruits Neurological: grossly nonfocal Psych: Normal mood and affect    Wt Readings from Last 3 Encounters:  11/28/17 179 lb 12.8 oz (81.6 kg)  09/27/17 173 lb 12.8 oz (78.8 kg)  09/10/17 174 lb 9.7 oz (79.2 kg)      Studies/Labs Reviewed:   EKG:  EKG is not ordered today.  Recent Labs:   Lipid Panel     Component Value Date/Time   CHOL 170 09/05/2017 1201   TRIG 183 (H) 09/05/2017 1201   HDL 29 (L) 09/05/2017 1201   CHOLHDL 5.9 (H) 09/05/2017 1201   CHOLHDL 5 12/28/2016 0936   VLDL 53.0 (H) 12/28/2016 0936   LDLCALC 104 (H) 09/05/2017 1201   LDLDIRECT 74.0 12/28/2016 0936   CBC:    Component Value Date/Time   WBC 8.5 09/27/2017  1135   WBC 8.1 09/10/2017 0443   HGB 14.6 09/27/2017 1135   HCT 40.9 09/27/2017 1135   PLT 189 09/27/2017 1135   MCV 84 09/27/2017 1135   NEUTROABS 11.0 (H) 02/02/2014 0009   LYMPHSABS 0.7 02/02/2014 0009   MONOABS 0.8 02/02/2014 0009   EOSABS 0.0 02/02/2014 0009   BASOSABS 0.0 02/02/2014 0009    BMET    Component Value Date/Time   NA 139 09/27/2017 1135   K 4.1 09/27/2017 1135   CL 102 09/27/2017 1135   CO2 23 09/27/2017 1135   GLUCOSE 119 (H) 09/27/2017 1135   GLUCOSE 129 (H) 09/10/2017 0443   BUN 16 09/27/2017 1135   CREATININE 1.13 09/27/2017 1135   CREATININE 1.03 04/03/2014 1648   CALCIUM 9.3 09/27/2017 1135   GFRNONAA 65 09/27/2017 1135   GFRAA 75 09/27/2017 1135     ASSESSMENT:    1. 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   2. Chronic diastolic heart failure ([place])   3. Bradycardia, drug induced   4. Essential hypertension   5. 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HCC)   6. Dyslipidemia (high LDL; low HDL)   7. Diabetes mellitus type 2 in nonobese (HCC)   8. Other fatigue   9. Tick bite, initial encounter   10. Anemia, unspecified type      PLAN:  In order of problems listed above:  1. CAD: Had crescendo angina in February, resolved after the stent procedure.  Understands he needs to take dual antiplatelet therapy through next February.  Would like to keep him on Brilinta for at least the first 6 months, but he complains of its cost.  We gave him samples today.  Try to see if we can obtain patient assistance from the manufacturer.  If not may need to switch to clopidogrel. 2. CHF: Left ventricular systolic function has returned to normal as of his most recent echocardiogram. NYHA functional class I.  There are no physical findings to suggest hypervolemia 3. Bradycardia: Because of complaints of fatigue we reduced the dose of carvedilol to 3.125 mg twice daily. 4. HTN: Amlodipine 5 mg daily as an antianginal and  antihypertensive.  Blood pressure control is adequate. 5. PAD: History of stent graft for aorto-iliac aneurysm in 2011, being followed in the vascular surgery clinic.   6. HLP: Told him that keeping his cholesterol low was our best fit of preventing future coronary events and need for more revascularization procedures.  He is currently off the statin again.  We talked about PCSK9 inhibitors, cost. 7. DM: Previously with excellent control, recently deteriorating, may be since he is a much less active. 8. Fatigue: Check TSH as well as routine labs.  Consider anemia due to dual antiplatelet therapy. cut back beta-blocker.  Need to consider depression if we find no objective abnormality to explain this complaint. 9. Tick bite: He does not have the typical rash or other complaints of Lyme disease, but will check serologies.    Medication Adjustments/Labs and  Tests Ordered: Current medicines are reviewed at length with the patient today.  Concerns regarding medicines are outlined above.  Medication changes, Labs and Tests ordered today are listed in the Patient Instructions below. Patient Instructions  Medication Instructions: Dr Sallyanne Kuster has recommended making the following medication changes: 1. DECREASE Carvedilol to 3.125 mg twice daily  Labwork: Your physician recommends that you return for lab work TODAY.  Testing/Procedures: NONE ORDERED  Follow-up: Dr Sallyanne Kuster recommends that you schedule a follow-up appointment first available.  If you need a refill on your cardiac medications before your next appointment, please call your pharmacy.       Signed, Sanda Klein, MD  11/28/2017 1:30 PM    [facility] [place], [place], [place]  [address] Phone: [phone]; Fax: [phone]

## 2017-11-28 NOTE — Patient Instructions (Signed)
Medication Instructions: Dr Sallyanne Kuster has recommended making the following medication changes: 1. DECREASE Carvedilol to 3.125 mg twice daily  Labwork: Your physician recommends that you return for lab work TODAY.  Testing/Procedures: NONE ORDERED  Follow-up: Dr Sallyanne Kuster recommends that you schedule a follow-up appointment first available.  If you need a refill on your cardiac medications before your next appointment, please call your pharmacy.

## 2017-11-29 LAB — IRON AND TIBC
Iron Saturation: 28 % (ref 15–55)
Iron: 87 ug/dL (ref 38–169)
Total Iron Binding Capacity: 307 ug/dL (ref 250–450)
UIBC: 220 ug/dL (ref 111–343)

## 2017-11-29 LAB — CBC
Hematocrit: 42.3 % (ref 37.5–51.0)
Hemoglobin: 14.4 g/dL (ref 13.0–17.7)
MCH: 29.8 pg (ref 26.6–33.0)
MCHC: 34 g/dL (ref 31.5–35.7)
MCV: 88 fL (ref 79–97)
Platelets: 161 10*3/uL (ref 150–450)
RBC: 4.83 x10E6/uL (ref 4.14–5.80)
RDW: 14.3 % (ref 12.3–15.4)
WBC: 7.1 10*3/uL (ref 3.4–10.8)

## 2017-11-29 LAB — B. BURGDORFI ANTIBODIES: Lyme IgG/IgM Ab: <0.91 {ISR} (ref 0.00–0.90)

## 2017-11-29 LAB — FOLATE: Folate: 4.8 ng/mL (ref >3.0)

## 2017-11-29 LAB — VITAMIN B12: Vitamin B-12: 292 pg/mL (ref 232–1245)

## 2017-11-29 LAB — TSH: TSH: 4.64 u[IU]/mL — ABNORMAL HIGH (ref 0.450–4.500)

## 2017-11-29 LAB — FERRITIN: Ferritin: 400 ng/mL (ref 30–400)

## 2017-12-01 ENCOUNTER — Encounter (HOSPITAL_COMMUNITY): Payer: Medicare Other

## 2017-12-01 ENCOUNTER — Ambulatory Visit: Payer: Medicare Other | Admitting: Family

## 2017-12-01 ENCOUNTER — Other Ambulatory Visit (HOSPITAL_COMMUNITY): Payer: Medicare Other

## 2017-12-07 ENCOUNTER — Telehealth: Payer: Self-pay

## 2017-12-07 DIAGNOSIS — R5383 Other fatigue: Secondary | ICD-10-CM

## 2017-12-07 NOTE — Telephone Encounter (Signed)
Repeat labs ordered and mailed to patient  

## 2017-12-07 NOTE — Telephone Encounter (Signed)
-----   Message from Sanda Klein, MD sent at 11/29/2017 11:12 AM EDT ----- The blood counts and iron studies and vitamin B12 levels are all normal. The thyroid test is very slightly abnormal, I don't think that is why he feels poorly (but will recheck it in one month - TSH and Free T4). If he does not feel better after we cut back the carvedilol, would start sertraline 50 mg daily in the evenings, for possible depression. Lyme disease tests are not back yet.

## 2017-12-09 ENCOUNTER — Other Ambulatory Visit: Payer: Self-pay | Admitting: Cardiovascular Disease

## 2017-12-09 ENCOUNTER — Other Ambulatory Visit: Payer: Self-pay | Admitting: Family Medicine

## 2017-12-09 NOTE — Telephone Encounter (Signed)
Rx sent to pharmacy   

## 2017-12-19 ENCOUNTER — Telehealth: Payer: Self-pay | Admitting: Cardiovascular Disease

## 2017-12-19 NOTE — Telephone Encounter (Signed)
Returned call to patient of Dr. Loletha Grayer. He was seen in May and noted to have fatigue. Copied below are the lab results. He is due to have additional thyroid testing. He states he has not noticed change since coreg dose decrease. He did not feel like this prior to having stents. He has no strength, but is weak. He feels like he can't do anything. Will route to MD/CMA  11/2017 Plan: Fatigue: Check TSH as well as routine labs.  Consider anemia due to dual antiplatelet therapy. cut back beta-blocker. Need to consider depression if we find no objective abnormality to explain this complaint.  Lab results:  Notes recorded by Sanda Klein, MD on 11/29/2017 at 11:12 AM EDT The blood counts and iron studies and vitamin B12 levels are all normal. The thyroid test is very slightly abnormal, I don't think that is why he feels poorly (but will recheck it in one month - TSH and Free T4). If he does not feel better after we cut back the carvedilol, would start sertraline 50 mg daily in the evenings, for possible depression. Lyme disease tests are not back yet.

## 2017-12-19 NOTE — Telephone Encounter (Signed)
Patient aware of MD recommendations. Med list updated. He will come to office for labs this week. He inquired about Brilinta samples - notified him that I will check on them. He will bring patient assistance paperwork to the office when he comes in for labs.   No samples available at time of this call.

## 2017-12-19 NOTE — Telephone Encounter (Signed)
Go ahead and stop carvedilol and check repeat TSH and free T4, please MCr

## 2017-12-19 NOTE — Telephone Encounter (Signed)
Pt's wife calling c/o pt having fatigue since March , pls advise [phone] or [phone]

## 2017-12-20 ENCOUNTER — Telehealth: Payer: Self-pay

## 2017-12-20 DIAGNOSIS — R5383 Other fatigue: Secondary | ICD-10-CM | POA: Diagnosis not present

## 2017-12-20 MED ORDER — TICAGRELOR 90 MG PO TABS: 90.0000 mg | ORAL_TABLET | Freq: Two times a day (BID) | ORAL | 3 refills | Status: DC

## 2017-12-21 ENCOUNTER — Telehealth: Payer: Self-pay

## 2017-12-21 LAB — T4, FREE: Free T4: 1.35 ng/dL (ref 0.82–1.77)

## 2017-12-21 LAB — TSH: TSH: 4.51 u[IU]/mL — ABNORMAL HIGH (ref 0.450–4.500)

## 2017-12-21 MED ORDER — SERTRALINE HCL 50 MG PO TABS: 50.0000 mg | ORAL_TABLET | Freq: Every day | ORAL | 0 refills | Status: DC

## 2017-12-21 NOTE — Telephone Encounter (Signed)
-----   Message from Sanda Klein, MD sent at 12/21/2017  5:23 PM EDT ----- Not really hypothyroid. Can we please start him on sertraline 50 mg at bedtime daily? I am starting to think that depression may be indeed the cause for his fatigue.

## 2017-12-21 NOTE — Telephone Encounter (Signed)
Called patient with results. Patient verbalized understanding and agreed with plan. Rx(s) sent to patient's preferred pharmacy electronically.

## 2018-01-03 ENCOUNTER — Other Ambulatory Visit: Payer: Self-pay | Admitting: Family Medicine

## 2018-01-04 ENCOUNTER — Telehealth: Payer: Self-pay | Admitting: Cardiovascular Disease

## 2018-01-04 MED ORDER — CLOPIDOGREL BISULFATE 75 MG PO TABS: 75.0000 mg | ORAL_TABLET | Freq: Every day | ORAL | 12 refills | Status: DC

## 2018-01-04 NOTE — Telephone Encounter (Signed)
Pt c/o medication issue:  1. Name of Medication: Brilinta  2. How are you currently taking this medication (dosage and times per day)? He takes it two times a day 3. Are you having a reaction (difficulty breathing--STAT)? Right after he takes the Brilinta he feels short of breath 4. What is your medication issue? No energy, just feels tired .

## 2018-01-04 NOTE — Telephone Encounter (Signed)
Please let him know I do not think the Brilinta is the cause of the complaint, but >90 days since cath we can switch from Brilinta to Clopidogrel 75 mg once daily. MCr

## 2018-01-04 NOTE — Telephone Encounter (Signed)
MD decision to switch anti-platelet.

## 2018-01-04 NOTE — Telephone Encounter (Signed)
Left message for pt to call back  °

## 2018-01-04 NOTE — Telephone Encounter (Signed)
Pt advised per Dr. Sallyanne Kuster to go ahead and stop Brilinta and to start Plavix 75mg  a day. RX sent to pharmacy for 30 days per pt request. Pt to call in a few days to let us know how he is feeling.

## 2018-01-04 NOTE — Telephone Encounter (Signed)
Spoke with the patient and he is reporting still feeling fatigued and sob after taking his Brilinta. He says he hasn't felt well in a long time and says he thinks things have become much worse after starting it and would like to consider other options. He says that he feels like he wants to "jump out of his skin" and doesn't feel like himself.  Advised her that I will discuss with the Pharmacist and give him a new recommendation. Pt agrees.

## 2018-01-05 ENCOUNTER — Telehealth: Payer: Self-pay | Admitting: Family Medicine

## 2018-01-05 NOTE — Telephone Encounter (Signed)
Copied from [place] [id number]. Topic: Quick Communication - See Telephone Encounter >> Jan 05, 2018  8:56 AM Bea Graff, NT wrote: CRM for notification. See Telephone encounter for: 01/05/18. Pt is scheduled for an appointment on 01/10/18 with Dr. [name] but would like to see if a partial refill of his LANTUS SOLOSTAR 100 UNIT/ML Solostar Pen can be called in to last him until that appointment? [place], [place] [phone] (Phone) [phone] (Fax)

## 2018-01-05 NOTE — Telephone Encounter (Signed)
Copied from [place] [id number]. Topic: Quick Communication - See Telephone Encounter >> Jan 05, 2018  4:05 PM Rutherford Nail, NT wrote: CRM for notification. See Telephone encounter for: 01/05/18. Sheela with [facility] calling and states that the wife of the patient was under the impression that the LANTUS SOLOSTAR 100 UNIT/ML Solostar Pen would be sent since the patient has a medication refill appointment scheduled for 01/10/18. Please advise.

## 2018-01-06 ENCOUNTER — Other Ambulatory Visit: Payer: Self-pay | Admitting: Family Medicine

## 2018-01-06 MED ORDER — INSULIN GLARGINE 100 UNIT/ML SOLOSTAR PEN: PEN_INJECTOR | SUBCUTANEOUS | 0 refills | Status: DC

## 2018-01-06 NOTE — Telephone Encounter (Signed)
Message sent to Dr. Jordan for review and approval. 

## 2018-01-06 NOTE — Telephone Encounter (Signed)
Prescription for Lantus sent to your pharmacy. Thanks, BJ

## 2018-01-06 NOTE — Telephone Encounter (Signed)
Duplicate, message has already been sent to Dr. [name] for review and pending approval.

## 2018-01-09 NOTE — Progress Notes (Signed)
HPI:   [name] is a 72 y.o. male, who is here today with his wife for 6 months follow up.   He was last seen on 06/10/2017.  Diabetes Mellitus II:   Currently on Lantus 30 units daily.  Checking BS's : 93-152. Hypoglycemia: Denies  He is tolerating medications well. He denies abdominal pain, nausea, vomiting, polydipsia, polyuria, or polyphagia. Occasionally burning sensation on feet.    Lab Results  Component Value Date   HGBA1C 6.3 06/10/2017   Lab Results  Component Value Date   MICROALBUR 1.6 08/16/2016   Since his last visit he has had coronary stent placement, 09/08/2017.  He is currently following with cardiologist, Dr. Sallyanne Kuster. He was also started on sertraline 50 mg daily.  According to patient he was diagnosed with anxiety and some depressed mood.  Symptoms resolved with sertraline. According to patient, he is following with cardiologist.   Hypertension:   Currently on amlodipine 10 mg daily and losartan 50 mg daily.  He is taking medications as instructed, no side effects reported.  He has not noted unusual headache, visual changes, exertional chest pain, dyspnea,  focal weakness, or edema.   Lab Results  Component Value Date   CREATININE 1.13 09/27/2017   BUN 16 09/27/2017   NA 139 09/27/2017   K 4.1 09/27/2017   CL 102 09/27/2017   CO2 23 09/27/2017    Hyperlipidemia: Currently he is on Crestor 10 mg 4 times per week.  He has not been consistent with taking medication because it causes fatigue and myalgias. He is trying to do better with low fat diet. He is not exercising regularly.   Lab Results  Component Value Date   CHOL 170 09/05/2017   HDL 29 (L) 09/05/2017   LDLCALC 104 (H) 09/05/2017   LDLDIRECT 74.0 12/28/2016   TRIG 183 (H) 09/05/2017   CHOLHDL 5.9 (H) 09/05/2017   GERD: Currently he is on Protonix 40 mg daily. He is taking medication daily. Symptoms are exacerbated by greasy food mainly.   He is also  requesting refills on Zofran 4 mg, which he has taken for years to treat nausea. Nausea is exacerbated by certain food. He usually takes medication once every couple weeks. He denies abdominal pain, vomiting, changes in bowel habits, blood in the stool, or melena.    Review of Systems  Constitutional: Positive for fatigue. Negative for activity change, appetite change and fever.  HENT: Negative for nosebleeds, sore throat and trouble swallowing.   Eyes: Negative for redness and visual disturbance.  Respiratory: Negative for apnea, cough, shortness of breath and wheezing.   Cardiovascular: Negative for chest pain, palpitations and leg swelling.  Gastrointestinal: Negative for abdominal pain, nausea and vomiting.  Endocrine: Negative for polydipsia, polyphagia and polyuria.  Genitourinary: Negative for decreased urine volume, dysuria and hematuria.  Musculoskeletal: Negative for gait problem and myalgias.  Skin: Negative for rash and wound.  Neurological: Negative for syncope, weakness and headaches.  Psychiatric/Behavioral: Negative for confusion. The patient is nervous/anxious.       Current Outpatient Medications on File Prior to Visit  Medication Sig Dispense Refill  . amLODipine (NORVASC) 10 MG tablet Take 0.5 tablets (5 mg total) by mouth daily. 90 tablet 3  . aspirin 81 MG tablet Take 81 mg by mouth daily.     . clopidogrel (PLAVIX) 75 MG tablet Take 1 tablet (75 mg total) by mouth daily. 30 tablet 12  . losartan (COZAAR) 50 MG tablet  TAKE 1 TABLET BY MOUTH TWO  TIMES DAILY 180 tablet 0  . pantoprazole (PROTONIX) 40 MG tablet TAKE 1 TABLET BY MOUTH  DAILY 90 tablet 1  . rosuvastatin (CRESTOR) 10 MG tablet Take 1 tablet (10 mg total) by mouth 4 (four) times a week. 44 tablet 3  . sertraline (ZOLOFT) 50 MG tablet Take 1 tablet (50 mg total) by mouth daily. 30 tablet 0  . ULTICARE MINI PEN NEEDLES 31G X 6 MM MISC USE AS DIRECTED 100 each 0  . nitroGLYCERIN (NITROSTAT) 0.4 MG SL  tablet Place 1 tablet (0.4 mg total) under the tongue every 5 (five) minutes as needed for chest pain. 90 tablet 3   No current facility-administered medications on file prior to visit.      Past Medical History:  Diagnosis Date  . Arthritis    "back" (04/05/2014)  . Atrial fibrillation ([place])   . Blood in stool   . Bradycardia- with decrease of BB some improvement 09/10/2017  . CAD (coronary artery disease)   . 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 Dr Darcey Nora - to LAD, SVG to diagonal, SVG to OM, SVG to PDA ;  b.  LHC (9/15): ostial LAD occl, ostial CFX 60%, prox AVCFX 95%, RCA 95%, S-PDA occl, S-OM/Dx with Dx limb occl and prox 60%, L-LAD patent, EF 35-40% wtih ant-lat and apical HK-AK >> PCI:  BMS to native RCA   . Diverticulosis   . Dizziness   . DM type 2 causing complication ([place]) 0/09/8880  . GERD (gastroesophageal reflux disease)   . Hypertension   . Iliac artery aneurysm ([place])   . Ischemic cardiomyopathy    a. EF 35-40% by LV gram 03/2014  . Joint pain   . Kidney stones   . Kidney stones   . Lower back pain Jan 2015  . Mixed hyperlipidemia 08/16/2013  . Myocardial infarction ([place]) 03/2014 X 2?  . S/P angioplasty with stent DES mid SVG to OM and DES to prox. SVG to OM, DAPT for lifetime 09/10/2017  . Type II diabetes mellitus ([facility])    Allergies  Allergen Reactions  . Codeine Nausea Only and Other (See Comments)    Cannot take on empty stomach.  . Valium Nausea Only and Other (See Comments)    Cannot take on empty stomach.    Social History   Socioeconomic History  . Marital status: Married    Spouse name: Not on file  . Number of children: Not on file  . Years of education: Not on file  . Highest education level: Not on file  Occupational History  . Not on file  Social Needs  . Financial resource strain: Not on file  . Food insecurity:    Worry: Not on file    Inability: Not on file  . Transportation needs:    Medical: Not on file    Non-medical: Not on file    Tobacco Use  . Smoking status: Former Smoker    Years: 2.00    Types: Cigars    Last attempt to quit: 09/09/2017    Years since quitting: 0.3  . Smokeless tobacco: Never Used  Substance and Sexual Activity  . Alcohol use: No  . Drug use: No  . Sexual activity: Yes  Lifestyle  . Physical activity:    Days per week: Not on file    Minutes per session: Not on file  . Stress: Not on file  Relationships  . Social connections:  Talks on phone: Not on file    Gets together: Not on file    Attends religious service: Not on file    Active member of club or organization: Not on file    Attends meetings of clubs or organizations: Not on file    Relationship status: Not on file  Other Topics Concern  . Not on file  Social History Narrative  . Not on file    Vitals:   01/10/18 1412  BP: 130/80  Pulse: 60  Resp: 12  Temp: 97.9 F (36.6 C)  SpO2: 97%   Body mass index is 26.43 kg/m.   Physical Exam  Nursing note and vitals reviewed. Constitutional: He is oriented to person, place, and time. He appears well-developed and well-nourished. No distress.  HENT:  Head: Normocephalic and atraumatic.  Mouth/Throat: Oropharynx is clear and moist and mucous membranes are normal.  Eyes: Pupils are equal, round, and reactive to light. Conjunctivae are normal.  Cardiovascular: Normal rate and regular rhythm.  No murmur heard. Pulses:      Dorsalis pedis pulses are 2+ on the right side, and 2+ on the left side.  Respiratory: Effort normal and breath sounds normal. No respiratory distress.  GI: Soft. He exhibits no mass. There is no hepatomegaly. There is no tenderness.  Musculoskeletal: He exhibits no edema or tenderness.  Lymphadenopathy:    He has no cervical adenopathy.  Neurological: He is alert and oriented to person, place, and time. He has normal strength.  Skin: Skin is warm. No rash noted. No erythema.  Psychiatric: He has a normal mood and affect. Cognition and memory are  normal.  Well groomed, good eye contact.    ASSESSMENT AND PLAN:   [name] was seen today for 6 months follow-up.  Orders Placed This Encounter  Procedures  . Microalbumin / creatinine urine ratio  . POCT glycosylated hemoglobin (Hb A1C)   Lab Results  Component Value Date   HGBA1C 6.6 (A) 01/10/2018    Type 2 diabetes mellitus with diabetic neuropathy, unspecified ([place]) HgA1C at goal. No changes in current management. Regular exercise and healthy diet with avoidance of added sugar food intake is an important part of treatment and recommended. Annual eye exam, periodic dental and foot care recommended. F/U in 5-6 months   Hypertension Adequately controlled. No changes in current management. DASHAnd low-salt diet to continue. Eye exam is current. F/U in 6 months, before if needed.   GERD (gastroesophageal reflux disease) Problem is stable overall. No changes in Protonix 40 mg daily. GERD precautions also recommended. Follow-up in 12 months, before if needed.  Nausea without vomiting ?  GERD, gastroparesis among some to consider. Zofran 4 mg daily as needed for nausea to continue. Instructed about warning signs. Follow-up in 6 to 12 months..  Hyperlipidemia, mixed For now he will continue Crestor 10 mg 4 times per week. We discussed the importance of low-fat diet. He has not tolerated daily statin.  He will benefit from PCSK9 inhibitor. We will plan on repeating lipid panel in 6 months if not done before at Dr Croitoru's office.      Casyn Becvar G. [name], MD  [facility]. [place] office.

## 2018-01-10 ENCOUNTER — Ambulatory Visit (INDEPENDENT_AMBULATORY_CARE_PROVIDER_SITE_OTHER): Payer: Medicare Other | Admitting: Family Medicine

## 2018-01-10 ENCOUNTER — Encounter: Payer: Self-pay | Admitting: Family Medicine

## 2018-01-10 VITALS — BP 130/80 | HR 60 | Temp 97.9°F | Resp 12 | Ht 69.0 in | Wt 179.0 lb

## 2018-01-10 DIAGNOSIS — E782 Mixed hyperlipidemia: Secondary | ICD-10-CM | POA: Diagnosis not present

## 2018-01-10 DIAGNOSIS — I10 Essential (primary) hypertension: Secondary | ICD-10-CM | POA: Diagnosis not present

## 2018-01-10 DIAGNOSIS — E1140 Type 2 diabetes mellitus with diabetic neuropathy, unspecified: Secondary | ICD-10-CM | POA: Diagnosis not present

## 2018-01-10 DIAGNOSIS — K219 Gastro-esophageal reflux disease without esophagitis: Secondary | ICD-10-CM | POA: Diagnosis not present

## 2018-01-10 DIAGNOSIS — R110 Nausea: Secondary | ICD-10-CM | POA: Diagnosis not present

## 2018-01-10 DIAGNOSIS — Z794 Long term (current) use of insulin: Secondary | ICD-10-CM | POA: Diagnosis not present

## 2018-01-10 LAB — POCT GLYCOSYLATED HEMOGLOBIN (HGB A1C): Hemoglobin A1C: 6.6 % — AB (ref 4.0–5.6)

## 2018-01-10 LAB — MICROALBUMIN / CREATININE URINE RATIO
Creatinine,U: 207.1 mg/dL
Microalb Creat Ratio: 1.1 mg/g (ref 0.0–30.0)
Microalb, Ur: 2.2 mg/dL — ABNORMAL HIGH (ref 0.0–1.9)

## 2018-01-10 MED ORDER — INSULIN GLARGINE 100 UNITS/ML SOLOSTAR PEN: 30.0000 [IU] | PEN_INJECTOR | Freq: Every day | SUBCUTANEOUS | 2 refills | Status: DC

## 2018-01-10 MED ORDER — ONDANSETRON HCL 4 MG PO TABS: 4.0000 mg | ORAL_TABLET | Freq: Three times a day (TID) | ORAL | 0 refills | Status: DC | PRN

## 2018-01-10 NOTE — Assessment & Plan Note (Signed)
?    GERD, gastroparesis among some to consider. Zofran 4 mg daily as needed for nausea to continue. Instructed about warning signs. Follow-up in 6 to 12 months.[name]

## 2018-01-10 NOTE — Assessment & Plan Note (Addendum)
For now he will continue Crestor 10 mg 4 times per week. We discussed the importance of low-fat diet. He has not tolerated daily statin.  He will benefit from PCSK9 inhibitor. We will plan on repeating lipid panel in 6 months if not done before at Dr Croitoru's office.

## 2018-01-10 NOTE — Patient Instructions (Addendum)
A few things to remember from today's visit:   Type 2 diabetes mellitus with diabetic neuropathy, with long-term current use of insulin ([place]) - Plan: POCT glycosylated hemoglobin (Hb A1C), Microalbumin / creatinine urine ratio, insulin glargine (LANTUS) 100 unit/mL SOPN  Essential hypertension  Gastroesophageal reflux disease, esophagitis presence not specified  Nausea without vomiting - Plan: ondansetron (ZOFRAN) 4 MG tablet  I would like for you you to schedule a Medicare Annual Wellness Visit (AWV).   This is a yearly appointment with our Health Coach Wynetta Fines, RN) and is designed to develop a personalized prevention plan. This is not a head to toe physical, but rather an opportunity to prevent illness based on your current health and risk factors for disease.   Visits usually last 30-60 minutes and include various screenings for hearing, vision, depression, and dementia, falls, and safety concerns. The visit also includes diet and exercise counseling and information about advance directives.   This is also an opportunity to discuss appropriate health maintenance testing such as mammography, colonoscopy, lung cancer screening, and hepatitis C testing.   The AWV is fully covered by Medicare Part B if:  . You have had Part B for over 12 months, AND . You have not had an AWV in the past 12 months .  Please don't miss out on this opportunity! Set up your appointment today!   Please be sure medication list is accurate. If a new problem present, please set up appointment sooner than planned today.

## 2018-01-10 NOTE — Assessment & Plan Note (Signed)
Adequately controlled. No changes in current management. DASHAnd low-salt diet to continue. Eye exam is current. F/U in 6 months, before if needed.

## 2018-01-10 NOTE — Assessment & Plan Note (Signed)
Problem is stable overall. No changes in Protonix 40 mg daily. GERD precautions also recommended. Follow-up in 12 months, before if needed.

## 2018-01-10 NOTE — Assessment & Plan Note (Signed)
HgA1C at goal. No changes in current management. Regular exercise and healthy diet with avoidance of added sugar food intake is an important part of treatment and recommended. Annual eye exam, periodic dental and foot care recommended. F/U in 5-6 months  

## 2018-01-17 NOTE — Progress Notes (Signed)
Subjective:   [name] is a 72 y.o. male who presents for Medicare Annual/Subsequent preventive examination.  Reports health as fair  BS wnl  100 to 150 mostly it has been 120   Diet BMI 26.4  Cho/hdl 5.9 a1c 6.6   Exercise Taking care of yard etc   Former smoker Quit 09/2017  2 pack years but smoked cigars and was remote use   There are no preventive care reminders to display for this patient.  PSA 08/2015        Objective:    Vitals: BP 140/80   Pulse 63   Ht 5\' 9"  (1.753 m)   Wt 179 lb (81.2 kg)   SpO2 97%   BMI 26.43 kg/m   Body mass index is 26.43 kg/m.  Advanced Directives 01/18/2018 09/08/2017 09/07/2016 09/05/2015 08/13/2014 04/05/2014 02/03/2014  Does Patient Have a Medical Advance Directive? No Yes No No No No Patient does not have advance directive;Patient would not like information  Type of Advance Directive - Living will;Healthcare Power of Attorney - - - - -  Does patient want to make changes to medical advance directive? - No - Patient declined - - - - -  Copy of [place] in Chart? - No - copy requested - - - - -  Would patient like information on creating a medical advance directive? - - No - Patient declined - Yes - Educational materials given No - patient declined information -  Pre-existing out of facility DNR order (yellow form or pink MOST form) - - - - - - No   Declined [place] form but state they have a form at home  Tobacco Social History   Tobacco Use  Smoking Status Former Smoker  . Years: 2.00  . Types: Cigars  . Last attempt to quit: 09/09/2017  . Years since quitting: 0.3  Smokeless Tobacco Never Used  Tobacco Comment   states he smoked cigar      Counseling given: Yes Comment: states he smoked cigar    Clinical Intake:  Past Medical History:  Diagnosis Date  . Arthritis    "back" (04/05/2014)  . Atrial fibrillation ([place])   . Blood in stool   . Bradycardia- with decrease of BB some improvement  09/10/2017  . CAD (coronary artery disease)   . 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 Dr Darcey Nora - to LAD, SVG to diagonal, SVG to OM, SVG to PDA ;  b.  LHC (9/15): ostial LAD occl, ostial CFX 60%, prox AVCFX 95%, RCA 95%, S-PDA occl, S-OM/Dx with Dx limb occl and prox 60%, L-LAD patent, EF 35-40% wtih ant-lat and apical HK-AK >> PCI:  BMS to native RCA   . Diverticulosis   . Dizziness   . DM type 2 causing complication ([place]) 03/14/2354  . GERD (gastroesophageal reflux disease)   . Hypertension   . Iliac artery aneurysm ([place])   . Ischemic cardiomyopathy    a. EF 35-40% by LV gram 03/2014  . Joint pain   . Kidney stones   . Kidney stones   . Lower back pain Jan 2015  . Mixed hyperlipidemia 08/16/2013  . Myocardial infarction ([place]) 03/2014 X 2?  . S/P angioplasty with stent DES mid SVG to OM and DES to prox. SVG to OM, DAPT for lifetime 09/10/2017  . Type II diabetes mellitus ([place])    Past Surgical History:  Procedure Laterality Date  . CARDIAC CATHETERIZATION  2006   "before  [facility]"  . CORONARY ANGIOPLASTY WITH STENT PLACEMENT  04/05/2014   "1"  . CORONARY ARTERY BYPASS GRAFT  2006   LIMA to LAD,SVG to diagonal,SVG to obtuse marginal,SVG to posterior descending  . CORONARY STENT INTERVENTION N/A 09/09/2017   Procedure: CORONARY STENT INTERVENTION;  Surgeon: Burnell Blanks, MD;  Location: [facility];  Service: Cardiovascular;  Laterality: N/A;  . CORONARY STENT PLACEMENT  09/09/2017  . CYSTOSCOPY W/ STONE MANIPULATION  1970's X 2  . CYSTOSCOPY W/ URETEROSCOPY W/ LITHOTRIPSY  ~ 2009  . ILIAC ARTERY STENT Left Aug. 10, 2011   Aorta to right ext iliac and left CIA stent repair   . LEFT HEART CATH AND CORS/GRAFTS ANGIOGRAPHY N/A 09/08/2017   Procedure: LEFT HEART CATH AND CORS/GRAFTS ANGIOGRAPHY;  Surgeon: [name], MD;  Location: [facility];  Service: Cardiovascular;  Laterality: N/A;  . LEFT HEART CATHETERIZATION WITH CORONARY/GRAFT ANGIOGRAM N/A 04/05/2014   Procedure:  LEFT HEART CATHETERIZATION WITH [name];  Surgeon: [name], MD;  Location: [facility] CATH [facility];  Service: Cardiovascular;  Laterality: N/A;  . NM MYOCAR PERF WALL MOTION  11/18/2009   low risk  . TONSILLECTOMY AND ADENOIDECTOMY  1951   Family History  Problem Relation Age of Onset  . Diabetes Mother   . Hypertension Mother   . Heart disease Mother        AAA  . Heart disease Sister   . Heart disease Son        Heart Disease before age 39  . Diabetes Brother    Social History   Socioeconomic History  . Marital status: Married    Spouse name: Not on file  . Number of children: Not on file  . Years of education: Not on file  . Highest education level: Not on file  Occupational History  . Not on file  Social Needs  . Financial resource strain: Not on file  . Food insecurity:    Worry: Not on file    Inability: Not on file  . Transportation needs:    Medical: Not on file    Non-medical: Not on file  Tobacco Use  . Smoking status: Former Smoker    Years: 2.00    Types: Cigars    Last attempt to quit: 09/09/2017    Years since quitting: 0.3  . Smokeless tobacco: Never Used  . Tobacco comment: states he smoked cigar   Substance and Sexual Activity  . Alcohol use: No  . Drug use: No  . Sexual activity: Yes  Lifestyle  . Physical activity:    Days per week: Not on file    Minutes per session: Not on file  . Stress: Not on file  Relationships  . Social connections:    Talks on phone: Not on file    Gets together: Not on file    Attends religious service: Not on file    Active member of club or organization: Not on file    Attends meetings of clubs or organizations: Not on file    Relationship status: Not on file  Other Topics Concern  . Not on file  Social History Narrative  . Not on file    Outpatient Encounter Medications as of 01/18/2018  Medication Sig  . aspirin 81 MG tablet Take 81 mg by mouth daily.   . clopidogrel (PLAVIX) 75 MG tablet Take  1 tablet (75 mg total) by mouth daily.  . insulin glargine (LANTUS) 100 unit/mL SOPN  Inject 0.3 mLs (30 Units total) into the skin daily.  [name] losartan (COZAAR) 50 MG tablet TAKE 1 TABLET BY MOUTH TWO  TIMES DAILY  . ondansetron (ZOFRAN) 4 MG tablet Take 1 tablet (4 mg total) by mouth every 8 (eight) hours as needed for nausea or vomiting.  . pantoprazole (PROTONIX) 40 MG tablet TAKE 1 TABLET BY MOUTH  DAILY  . rosuvastatin (CRESTOR) 10 MG tablet Take 1 tablet (10 mg total) by mouth 4 (four) times a week.  . sertraline (ZOLOFT) 50 MG tablet Take 1 tablet (50 mg total) by mouth daily.  [name] ULTICARE MINI PEN NEEDLES 31G X 6 MM MISC USE AS DIRECTED  . amLODipine (NORVASC) 10 MG tablet Take 0.5 tablets (5 mg total) by mouth daily. (Patient not taking: Reported on 01/18/2018)  . nitroGLYCERIN (NITROSTAT) 0.4 MG SL tablet Place 1 tablet (0.4 mg total) under the tongue every 5 (five) minutes as needed for chest pain.   No facility-administered encounter medications on file as of 01/18/2018.     Activities of Daily Living In your present state of health, do you have any difficulty performing the following activities: 09/09/2017 09/09/2017  Hearing? N N  Vision? N N  Difficulty concentrating or making decisions? N N  Walking or climbing stairs? N N  Dressing or bathing? N N  Doing errands, shopping? N N  Some recent data might be hidden    Patient Care Team: [name], [name], MD as PCP - General (Family Medicine) Sanda Klein, MD as PCP - Cardiology (Cardiology) Sanda Klein, MD as Attending Physician (Cardiology)   Assessment:   This is a routine wellness examination for [place].  Exercise Activities and Dietary recommendations    Goals    . Patient Stated     To feel better and More normal        Fall Risk Fall Risk  01/18/2018  Falls in the past year? No     Depression Screen PHQ 2/9 Scores 01/18/2018  PHQ - 2 Score 0  PHQ- 9 Score 4   Was told his cardiac surgery went well Not  sleeping well Feels cold all the time Cardiology ordered zoloft; Agreed to call cardiology tomorrow to let him know he is still depressed. May increase dosage. (Ordered by Dr. Sallyanne Kuster)   Cognitive Function   Ad8 score reviewed for issues:  Issues making decisions:  Less interest in hobbies / activities:  Repeats questions, stories (family complaining):  Trouble using ordinary gadgets (microwave, computer, phone):  Forgets the month or year:   Mismanaging finances:   Remembering appts:  Daily problems with thinking and/or memory: Ad8 score is=0        Immunization History  Administered Date(s) Administered  . Influenza, High Dose Seasonal PF 04/15/2017  . Tdap 07/12/2012     Screening Tests Health Maintenance  Topic Date Due  . Hepatitis C Screening  07/08/2018 (Originally 06-Dec-1945)  . PNA vac Low Risk Adult (1 of 2 - PCV13) 01/11/2019 (Originally 01/20/2011)  . INFLUENZA VACCINE  02/09/2018  . HEMOGLOBIN A1C  07/13/2018  . OPHTHALMOLOGY EXAM  10/28/2018  . FOOT EXAM  01/11/2019  . TETANUS/TDAP  07/12/2022  . COLONOSCOPY  09/15/2025         Plan:      PCP Notes   Health Maintenance   Abnormal Screens  Phq 4;  States Dr. Sallyanne Kuster placed him on Zoloft but he still feels depressed. No energy. Felt that way since stent surgery. Agreed to call Dr.  Croitoru tomorrow to let him know Also; wife and he admits to financial constraints; paying huge monthly payment on a life insurance policy. Given resource for the health insurance program to review his insurance needs. Manuela Schwartz to outreach tomorrow for further assistance to evaluate. Also given community resources  Referrals  none  Patient concerns; Heart doctor took him off amlodipine  Will call for increase in anti depressant Manuela Schwartz to fup next week   Nurse Concerns; As noted  Next PCP apt seen 01/10/2018 Apt in Jan 2020    I have personally reviewed and noted the following in the patient's chart:     . Medical and social history . Use of alcohol, tobacco or illicit drugs  . Current medications and supplements . Functional ability and status . Nutritional status . Physical activity . Advanced directives . List of other physicians . Hospitalizations, surgeries, and ER visits in previous 12 months . Vitals . Screenings to include cognitive, depression, and falls . Referrals and appointments  In addition, I have reviewed and discussed with patient certain preventive protocols, quality metrics, and best practice recommendations. A written personalized care plan for preventive services as well as general preventive health recommendations were provided to patient.     Wynetta Fines, RN  01/18/2018

## 2018-01-18 ENCOUNTER — Ambulatory Visit (INDEPENDENT_AMBULATORY_CARE_PROVIDER_SITE_OTHER): Payer: Medicare Other

## 2018-01-18 VITALS — BP 140/80 | HR 63 | Ht 69.0 in | Wt 179.0 lb

## 2018-01-18 DIAGNOSIS — Z0000 Encounter for general adult medical examination without abnormal findings: Secondary | ICD-10-CM

## 2018-01-18 NOTE — Patient Instructions (Addendum)
[name] , Thank you for taking time to come for your Medicare Wellness Visit. I appreciate your ongoing commitment to your health goals. Please review the following plan we discussed and let me know if I can assist you in the future.   Please call your heart doctor regarding increasing your anit-depressant   There is a senior health insurance program has a representative to discuss insurance needs   [employer]  [facility]  [address] [place] [place] [address]  [phone]   Diabetes and weight loss; [facility]  Phone: [phone]   [place] [facility]; [phone] [address] [place] [facility]; [phone] Get resource to get information on any and all community programs for Seniors  Community solutions; "Aging Gracefully In Place" program; can request or apply  [place]: (954) 432-5649 Community Health Response Program -638-756-4332 Public Health Dept; Need to be a skilled visit but can assist with bathing as well; [phone]   Help with Rx at [place]  Monday - Friday 8am to 10pm EST Sat- Sunday 9am to 7pm Patient help line (669)569-5213 Email support online at [employer]gl  Dept of Social Services; Call [phone] and ask for SW on call  Options for Medicaid include the [employer] program; [facility] (personal care services) or PACE program, which is a medical and social program combined   [employer] general resources for food etc    Deaf & Hard of Hearing Division Services - can assist with hearing aid x 1  No reviews  [place]  [address]  [phone]    These are the goals we discussed: Goals    . Patient Stated     To feel better and More normal        This is a list of the screening recommended for you and due dates:  Health Maintenance  Topic Date Due  .  Hepatitis C: One time  screening is recommended by Center for Disease Control  (CDC) for  adults born from [birth year] through 1965.   07/08/2018*  . Pneumonia vaccines (1 of 2 - PCV13) 01/11/2019*  . Flu Shot  02/09/2018  . Hemoglobin A1C  07/13/2018  . Eye exam for diabetics  10/28/2018  . Complete foot exam   01/11/2019  . Tetanus Vaccine  07/12/2022  . Colon Cancer Screening  09/15/2025  *Topic was postponed. The date shown is not the original due date.      Fall Prevention in the Home Falls can cause injuries. They can happen to people of all ages. There are many things you can do to make your home safe and to help prevent falls. What can I do on the outside of my home?  Regularly fix the edges of walkways and driveways and fix any cracks.  Remove anything that might make you trip as you walk through a door, such as a raised step or threshold.  Trim any bushes or trees on the path to your home.  Use bright outdoor lighting.  Clear any walking paths of anything that might make someone trip, such as rocks or tools.  Regularly check to see if handrails are loose or broken. Make sure that both sides of any steps have handrails.  Any raised decks and porches should have guardrails on the edges.  Have any leaves, snow, or ice cleared regularly.  Use sand or salt on walking paths during winter.  Clean up any spills in your garage right away.  This includes oil or grease spills. What can I do in the bathroom?  Use night lights.  Install grab bars by the toilet and in the tub and shower. Do not use towel bars as grab bars.  Use non-skid mats or decals in the tub or shower.  If you need to sit down in the shower, use a plastic, non-slip stool.  Keep the floor dry. Clean up any water that spills on the floor as soon as it happens.  Remove soap buildup in the tub or shower regularly.  Attach bath mats securely with double-sided non-slip rug tape.  Do not have throw rugs and other things on the floor  that can make you trip. What can I do in the bedroom?  Use night lights.  Make sure that you have a light by your bed that is easy to reach.  Do not use any sheets or blankets that are too big for your bed. They should not hang down onto the floor.  Have a firm chair that has side arms. You can use this for support while you get dressed.  Do not have throw rugs and other things on the floor that can make you trip. What can I do in the kitchen?  Clean up any spills right away.  Avoid walking on wet floors.  Keep items that you use a lot in easy-to-reach places.  If you need to reach something above you, use a strong step stool that has a grab bar.  Keep electrical cords out of the way.  Do not use floor polish or wax that makes floors slippery. If you must use wax, use non-skid floor wax.  Do not have throw rugs and other things on the floor that can make you trip. What can I do with my stairs?  Do not leave any items on the stairs.  Make sure that there are handrails on both sides of the stairs and use them. Fix handrails that are broken or loose. Make sure that handrails are as long as the stairways.  Check any carpeting to make sure that it is firmly attached to the stairs. Fix any carpet that is loose or worn.  Avoid having throw rugs at the top or bottom of the stairs. If you do have throw rugs, attach them to the floor with carpet tape.  Make sure that you have a light switch at the top of the stairs and the bottom of the stairs. If you do not have them, ask someone to add them for you. What else can I do to help prevent falls?  Wear shoes that: ? Do not have high heels. ? Have rubber bottoms. ? Are comfortable and fit you well. ? Are closed at the toe. Do not wear sandals.  If you use a stepladder: ? Make sure that it is fully opened. Do not climb a closed stepladder. ? Make sure that both sides of the stepladder are locked into place. ? Ask someone to hold it  for you, if possible.  Clearly mark and make sure that you can see: ? Any grab bars or handrails. ? First and last steps. ? Where the edge of each step is.  Use tools that help you move around (mobility aids) if they are needed. These include: ? Canes. ? Walkers. ? Scooters. ? Crutches.  Turn on the lights when you go into a dark area. Replace any light bulbs as soon as they burn out.  Set up your furniture  so you have a clear path. Avoid moving your furniture around.  If any of your floors are uneven, fix them.  If there are any pets around you, be aware of where they are.  Review your medicines with your doctor. Some medicines can make you feel dizzy. This can increase your chance of falling. Ask your doctor what other things that you can do to help prevent falls. This information is not intended to replace advice given to you by your health care provider. Make sure you discuss any questions you have with your health care provider. Document Released: 04/24/2009 Document Revised: 12/04/2015 Document Reviewed: 08/02/2014 Elsevier Interactive Patient Education  2018 [place] Maintenance, Male A healthy lifestyle and preventive care is important for your health and wellness. Ask your health care provider about what schedule of regular examinations is right for you. What should I know about weight and diet? Eat a Healthy Diet  Eat plenty of vegetables, fruits, whole grains, low-fat dairy products, and lean protein.  Do not eat a lot of foods high in solid fats, added sugars, or salt.  Maintain a Healthy Weight Regular exercise can help you achieve or maintain a healthy weight. You should:  Do at least 150 minutes of exercise each week. The exercise should increase your heart rate and make you sweat (moderate-intensity exercise).  Do strength-training exercises at least twice a week.  Watch Your Levels of Cholesterol and Blood Lipids  Have your blood tested  for lipids and cholesterol every 5 years starting at 72 years of age. If you are at high risk for heart disease, you should start having your blood tested when you are 72 years old. You may need to have your cholesterol levels checked more often if: ? Your lipid or cholesterol levels are high. ? You are older than 72 years of age. ? You are at high risk for heart disease.  What should I know about cancer screening? Many types of cancers can be detected early and may often be prevented. Lung Cancer  You should be screened every year for lung cancer if: ? You are a current smoker who has smoked for at least 30 years. ? You are a former smoker who has quit within the past 15 years.  Talk to your health care provider about your screening options, when you should start screening, and how often you should be screened.  Colorectal Cancer  Routine colorectal cancer screening usually begins at 72 years of age and should be repeated every 5-10 years until you are 72 years old. You may need to be screened more often if early forms of precancerous polyps or small growths are found. Your health care provider may recommend screening at an earlier age if you have risk factors for colon cancer.  Your health care provider may recommend using home test kits to check for hidden blood in the stool.  A small camera at the end of a tube can be used to examine your colon (sigmoidoscopy or colonoscopy). This checks for the earliest forms of colorectal cancer.  Prostate and Testicular Cancer  Depending on your age and overall health, your health care provider may do certain tests to screen for prostate and testicular cancer.  Talk to your health care provider about any symptoms or concerns you have about testicular or prostate cancer.  Skin Cancer  Check your skin from head to toe regularly.  Tell your health care provider about any new moles or changes in  moles, especially if: ? There is a change in a  mole's size, shape, or color. ? You have a mole that is larger than a pencil eraser.  Always use sunscreen. Apply sunscreen liberally and repeat throughout the day.  Protect yourself by wearing long sleeves, pants, a wide-brimmed hat, and sunglasses when outside.  What should I know about heart disease, diabetes, and high blood pressure?  If you are 95-62 years of age, have your blood pressure checked every 3-5 years. If you are 65 years of age or older, have your blood pressure checked every year. You should have your blood pressure measured twice-once when you are at a hospital or clinic, and once when you are not at a hospital or clinic. Record the average of the two measurements. To check your blood pressure when you are not at a hospital or clinic, you can use: ? An automated blood pressure machine at a pharmacy. ? A home blood pressure monitor.  Talk to your health care provider about your target blood pressure.  If you are between 14-71 years old, ask your health care provider if you should take aspirin to prevent heart disease.  Have regular diabetes screenings by checking your fasting blood sugar level. ? If you are at a normal weight and have a low risk for diabetes, have this test once every three years after the age of 15. ? If you are overweight and have a high risk for diabetes, consider being tested at a younger age or more often.  A one-time screening for abdominal aortic aneurysm (AAA) by ultrasound is recommended for men aged 54-75 years who are current or former smokers. What should I know about preventing infection? Hepatitis B If you have a higher risk for hepatitis B, you should be screened for this virus. Talk with your health care provider to find out if you are at risk for hepatitis B infection. Hepatitis C Blood testing is recommended for:  Everyone born from [birth year] through 1965.  Anyone with known risk factors for hepatitis C.  Sexually Transmitted Diseases  (STDs)  You should be screened each year for STDs including gonorrhea and chlamydia if: ? You are sexually active and are younger than 72 years of age. ? You are older than 72 years of age and your health care provider tells you that you are at risk for this type of infection. ? Your sexual activity has changed since you were last screened and you are at an increased risk for chlamydia or gonorrhea. Ask your health care provider if you are at risk.  Talk with your health care provider about whether you are at high risk of being infected with HIV. Your health care provider may recommend a prescription medicine to help prevent HIV infection.  What else can I do?  Schedule regular health, dental, and eye exams.  Stay current with your vaccines (immunizations).  Do not use any tobacco products, such as cigarettes, chewing tobacco, and e-cigarettes. If you need help quitting, ask your health care provider.  Limit alcohol intake to no more than 2 drinks per day. One drink equals 12 ounces of beer, 5 ounces of wine, or 1 ounces of hard liquor.  Do not use street drugs.  Do not share needles.  Ask your health care provider for help if you need support or information about quitting drugs.  Tell your health care provider if you often feel depressed.  Tell your health care provider if you have ever been abused  or do not feel safe at home. This information is not intended to replace advice given to you by your health care provider. Make sure you discuss any questions you have with your health care provider. Document Released: 12/25/2007 Document Revised: 02/25/2016 Document Reviewed: 04/01/2015 Elsevier Interactive Patient Education  [employer].

## 2018-01-19 ENCOUNTER — Other Ambulatory Visit: Payer: Self-pay | Admitting: Cardiovascular Disease

## 2018-01-19 ENCOUNTER — Telehealth: Payer: Self-pay | Admitting: Cardiovascular Disease

## 2018-01-19 NOTE — Telephone Encounter (Signed)
Pt c/o medication issue: 1. Name of Medication: Zoloft 2. How are you currently taking this medication (dosage and times per day)? 50mg  1 daily 3. Are you having a reaction (difficulty breathing--STAT)?  No 4. What is your medication issue? Jitter and hands shaky

## 2018-01-19 NOTE — Telephone Encounter (Signed)
Call returned to the patient. Line was busy.

## 2018-01-20 NOTE — Telephone Encounter (Signed)
Spoke with pt and advised of Dr. Lurline Del recommendation. Pt verbalized understanding.

## 2018-01-20 NOTE — Telephone Encounter (Signed)
Follow up    Patient wife states that their phone was down , why did we not try the cell?  Returning call please call

## 2018-01-20 NOTE — Telephone Encounter (Signed)
Spoke with pt who states that since he has started on the zoloft, he doesn't feel as tried as he use to but since has developed a jittery feeling. He reports that when he is trying to hold utensil his hands are shaking and he's having a nervous type feeling. He denies any other symptoms. Routing to Dr. Loletha Grayer for recommendation.

## 2018-01-20 NOTE — Telephone Encounter (Signed)
Understood. I would like to give this dose of medication a little longer (at least a couple of weeks). As long as the symptoms stay the same and do not worsen, would continue it [facility]

## 2018-01-24 ENCOUNTER — Other Ambulatory Visit: Payer: Self-pay

## 2018-01-24 ENCOUNTER — Ambulatory Visit (INDEPENDENT_AMBULATORY_CARE_PROVIDER_SITE_OTHER): Payer: Medicare Other | Admitting: Family

## 2018-01-24 ENCOUNTER — Encounter: Payer: Self-pay | Admitting: Family

## 2018-01-24 ENCOUNTER — Ambulatory Visit (INDEPENDENT_AMBULATORY_CARE_PROVIDER_SITE_OTHER)
Admission: RE | Admit: 2018-01-24 | Discharge: 2018-01-24 | Disposition: A | Discharge status: Home / Self Care | Payer: Medicare Other | Source: Ambulatory Visit | Attending: Family | Admitting: Family

## 2018-01-24 ENCOUNTER — Ambulatory Visit (HOSPITAL_COMMUNITY)
Admission: RE | Admit: 2018-01-24 | Discharge: 2018-01-24 | Disposition: A | Discharge status: Home / Self Care | Payer: Medicare Other | Source: Ambulatory Visit | Attending: Family | Admitting: Family

## 2018-01-24 VITALS — BP 170/86 | HR 53 | Temp 96.8°F | Resp 18 | Ht 69.0 in | Wt 175.0 lb

## 2018-01-24 DIAGNOSIS — R0989 Other specified symptoms and signs involving the circulatory and respiratory systems: Secondary | ICD-10-CM

## 2018-01-24 DIAGNOSIS — E1151 Type 2 diabetes mellitus with diabetic peripheral angiopathy without gangrene: Secondary | ICD-10-CM

## 2018-01-24 DIAGNOSIS — I739 Peripheral vascular disease, unspecified: Secondary | ICD-10-CM | POA: Diagnosis not present

## 2018-01-24 DIAGNOSIS — I723 Aneurysm of iliac artery: Secondary | ICD-10-CM | POA: Diagnosis not present

## 2018-01-24 DIAGNOSIS — Z95828 Presence of other vascular implants and grafts: Secondary | ICD-10-CM | POA: Insufficient documentation

## 2018-01-24 NOTE — Progress Notes (Signed)
Vitals:   01/24/18 0948  BP: (!) 167/90  Pulse: (!) 54  Resp: 18  Temp: (!) 96.8 F (36 C)  TempSrc: Oral  SpO2: 97%  Weight: 175 lb (79.4 kg)  Height: 5\' 9"  (1.753 m)

## 2018-01-24 NOTE — Progress Notes (Addendum)
[facility] [place]  CC: Follow up s/p Endovascular Repair of Abdominal Aortic Aneurysm    History of Present Illness  [name] is a 72 y.o. ([birth year]) male who returns for continued followup regarding his aortic stent graft placed for a right common iliac artery aneurysm in August of 2011 by Dr. Kellie Simmering.  He had an aorto to right external iliac and left common iliac Gore stent graft placed.  Has intermittent low back pain for years, alleviated by an NSAID, the iliac artery aneurysm was found incidental to evaluation of low back pain, not improved with repair, states he was told that he has arthritis in spine, this pain is aggravated by strenuous work and relieved by Aleve.   Pt. denies claudication type symptoms in his legs with walking, denies non healing wounds, denies history of stroke or TIA symptoms.  States he walks a great deal, and legs get tired after walking a great deal, pt unable to quantify.   He had 2 cardiac stents placed in March 2019, sx's were burning sensation in both sides of his neck.  He had a CABG in 2006, same sx's at that time.   Diabetic: Yes, review of records: A1C on 01-10-18, was 6.6,  good control Tobaccos use: smoker (rare cigar)   Pt meds include: Statin :yes Betablocker: no, stopped due to him feeling too tired ASA: Yes Other anticoagulants/antiplatelets: Plavix   Past Medical History:  Diagnosis Date  . Arthritis    "back" (04/05/2014)  . Atrial fibrillation ([place])   . Blood in stool   . Bradycardia- with decrease of BB some improvement 09/10/2017  . CAD (coronary artery disease)   . 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 Dr Darcey Nora - to LAD, SVG to diagonal, SVG to OM, SVG to PDA ;  b.  LHC (9/15): ostial LAD occl, ostial CFX 60%, prox AVCFX 95%, RCA 95%, S-PDA occl, S-OM/Dx with Dx limb occl and prox 60%, L-LAD patent, EF 35-40% wtih ant-lat and apical HK-AK >> PCI:  BMS to native RCA   . Diverticulosis   . Dizziness   .  DM type 2 causing complication ([place]) 01/17/2955  . GERD (gastroesophageal reflux disease)   . Hypertension   . Iliac artery aneurysm ([place])   . Ischemic cardiomyopathy    a. EF 35-40% by LV gram 03/2014  . Joint pain   . Kidney stones   . Kidney stones   . Lower back pain Jan 2015  . Mixed hyperlipidemia 08/16/2013  . Myocardial infarction ([place]) 03/2014 X 2?  . S/P angioplasty with stent DES mid SVG to OM and DES to prox. SVG to OM, DAPT for lifetime 09/10/2017  . Type II diabetes mellitus ([place])    Past Surgical History:  Procedure Laterality Date  . CARDIAC CATHETERIZATION  2006   "before OHS"  . CORONARY ANGIOPLASTY WITH STENT PLACEMENT  04/05/2014   "1"  . CORONARY ARTERY BYPASS GRAFT  2006   LIMA to LAD,SVG to diagonal,SVG to obtuse marginal,SVG to posterior descending  . CORONARY STENT INTERVENTION N/A 09/09/2017   Procedure: CORONARY STENT INTERVENTION;  Surgeon: Burnell Blanks, MD;  Location: [facility];  Service: Cardiovascular;  Laterality: N/A;  . CORONARY STENT PLACEMENT  09/09/2017  . CYSTOSCOPY W/ STONE MANIPULATION  1970's X 2  . CYSTOSCOPY W/ URETEROSCOPY W/ LITHOTRIPSY  ~ 2009  . ILIAC ARTERY STENT Left Aug. 10, 2011   Aorta to right ext iliac and left CIA  stent repair   . LEFT HEART CATH AND CORS/GRAFTS ANGIOGRAPHY N/A 09/08/2017   Procedure: LEFT HEART CATH AND CORS/GRAFTS ANGIOGRAPHY;  Surgeon: [name], MD;  Location: [facility];  Service: Cardiovascular;  Laterality: N/A;  . LEFT HEART CATHETERIZATION WITH CORONARY/GRAFT ANGIOGRAM N/A 04/05/2014   Procedure: LEFT HEART CATHETERIZATION WITH [name];  Surgeon: [name], MD;  Location: [facility];  Service: Cardiovascular;  Laterality: N/A;  . NM MYOCAR PERF WALL MOTION  11/18/2009   low risk  . TONSILLECTOMY AND ADENOIDECTOMY  1951   Social History Social History   Tobacco Use  . Smoking status: Former Smoker    Years: 2.00    Types: Cigars    Last attempt to  quit: 09/09/2017    Years since quitting: 0.3  . Smokeless tobacco: Never Used  . Tobacco comment: states he smoked cigar   Substance Use Topics  . Alcohol use: No  . Drug use: No   Family History Family History  Problem Relation Age of Onset  . Diabetes Mother   . Hypertension Mother   . Heart disease Mother        AAA  . Heart disease Sister   . Heart disease Son        Heart Disease before age 34  . Diabetes Brother    Current Outpatient Medications on File Prior to Visit  Medication Sig Dispense Refill  . amLODipine (NORVASC) 10 MG tablet Take 0.5 tablets (5 mg total) by mouth daily. 90 tablet 3  . aspirin 81 MG tablet Take 81 mg by mouth daily.     . clopidogrel (PLAVIX) 75 MG tablet Take 1 tablet (75 mg total) by mouth daily. 30 tablet 12  . insulin glargine (LANTUS) 100 unit/mL SOPN Inject 0.3 mLs (30 Units total) into the skin daily. 15 mL 2  . losartan (COZAAR) 50 MG tablet TAKE 1 TABLET BY MOUTH TWO  TIMES DAILY 180 tablet 0  . ondansetron (ZOFRAN) 4 MG tablet Take 1 tablet (4 mg total) by mouth every 8 (eight) hours as needed for nausea or vomiting. 20 tablet 0  . pantoprazole (PROTONIX) 40 MG tablet TAKE 1 TABLET BY MOUTH  DAILY 90 tablet 1  . rosuvastatin (CRESTOR) 10 MG tablet Take 1 tablet (10 mg total) by mouth 4 (four) times a week. 44 tablet 3  . sertraline (ZOLOFT) 50 MG tablet TAKE 1 TABLET DAILY 30 tablet 11  . ULTICARE MINI PEN NEEDLES 31G X 6 MM MISC USE AS DIRECTED 100 each 0  . nitroGLYCERIN (NITROSTAT) 0.4 MG SL tablet Place 1 tablet (0.4 mg total) under the tongue every 5 (five) minutes as needed for chest pain. 90 tablet 3   No current facility-administered medications on file prior to visit.    Allergies  Allergen Reactions  . Codeine Nausea Only and Other (See Comments)    Cannot take on empty stomach.  . Valium Nausea Only and Other (See Comments)    Cannot take on empty stomach.     ROS: See HPI for pertinent positives and  negatives.  Physical Examination  Vitals:   01/24/18 0948 01/24/18 0953  BP: (!) 167/90 (!) 170/86  Pulse: (!) 54 (!) 53  Resp: 18   Temp: (!) 96.8 F (36 C)   TempSrc: Oral   SpO2: 97%   Weight: 175 lb (79.4 kg)   Height: 5\' 9"  (1.753 m)    Body mass index is 25.84 kg/m.  General: A&O x  3, WD, male HEENT: No gross abnormalities  Pulmonary: Sym exp, respirations are non labored, good air movement in all fields, CTAB, no rales, rhonchi, or wheezing. Cardiac: Regular rhythm and rate, no murmur appreciated  Vascular: Vessel Right Left  Radial 2+Palpable 2+Palpable  Brachial Palpable Palpable  Carotid  without bruit  without bruit  Aorta Not palpable N/A  Femoral 1+Palpable 3+Palpable  Popliteal 3+palpable 3+ palpable  PT 2+Palpable 2+Palpable  DP notPalpable 1+Palpable   Gastrointestinal: soft, NTND, -G/R, - HSM, - palpable masses, - CVAT B. Musculoskeletal: M/S 5/5 throughout, extremities without ischemic changes. Skin: No rashes, no ulcers, no cellulitis.   Neurologic: Pain and light touch intact in extremities, Motor exam as listed above. Psychiatric: Normal thought content, mood appropriate for clinical situation.    DATA  EVAR Duplex   Previous (Date: 09-07-16) AAA sac size: 2.1 cm; Right CIA: 1.41.cm; Left CIA: 1.2 cm Limited visualization  Current (Date: 01-24-18)  AAA sac size: 2.01 cm; Right CIA: 1.2 cm; Left CIA: 1.4  no endoleak detected Limited visualization of the abdominal vasculature due to overlying bowel gas.     CTA Abd/Pelvis Duplex (Date: 06-08-11) Stable sequela of infrarenal abdominal aortic aneurysm repair. There is extension of the right common iliac limb into the right external iliac artery with thrombosis of the previously identified right common iliac artery aneurysm. The valve occluded right common iliac artery aneurysm now measures approximately 2.5 x 2.5 cm (image 120, series four) previously, 2.8 x 3.3 cm.  Stable sequela of  coil embolization of the proximal aspect of the right internal iliac artery.  There is opacification of distal tributaries of the right internal iliac artery possibly via transpelvic collaterals. No evidence of Endo leak, though delayed images were not obtained through the pelvis.  The major branch vessels of the abdominal aorta are patent.  The IMA appears thrombosed at its origin, however is reconstituted via the marginal artery [place].  No evidence of an organ ischemia.   Bilateral Popliteal artery Duplex (01-24-18): No popliteal artery aneurysm bilaterally.    ABI (Date: 01/24/2018):  R:   ABI: 1.09 (no previois),   PT: tri  DP: bi  TBI:  0.95  L:   ABI: 1.06 (no previous),   PT: tri  DP: tri  TBI: 0.94  Normal bilateral ABI and TBI, tri and biphasic waveforms.      Medical Decision Making  KAULANA BRINDLE is a 72 y.o. male who presents s/p EVAR (Date: August 2011).  Pt is asymptomatic with decreased sac size of right CIA aneurysm, to 1.2 cm from 1.4 cm on duplex dated 09-07-16. Both duplex with limited visualization.   Prominent popliteal pulses: no popliteal artery aneurysms on duplex (01-24-18).  Normal bilateral ABI's in 01-24-18.   Pt did not take Gas-X, nor avoid gas forming foods yesterday.  Limited visualization of the abdominal vasculature due to overlying bowel gas.   I discussed with the patient the importance of surveillance of the endograft.  The next endograft duplex will be scheduled for 12 months.  The patient will follow up with [place] in 12 months with these studies.  I emphasized the importance of maximal medical management including strict control of blood pressure, blood glucose, and lipid levels, antiplatelet agents, obtaining regular exercise, and cessation of smoking.   Thank you for allowing [place] to participate in this patient's care.  Clemon Chambers, RN, MSN, FNP-C [facility] [place] Office:  [phone]  Clinic Physician: Early  01/24/2018, 10:03  AM

## 2018-01-24 NOTE — Patient Instructions (Signed)
Before your next abdominal ultrasound:  Avoid gas forming foods and beverages the day before the test.   Take two Extra-Strength Gas-X capsules at bedtime the night before the test. Take another two Extra-Strength Gas-X capsules in the middle of the night if you get up to the restroom, if not, first thing in the morning with water.    

## 2018-01-25 NOTE — Progress Notes (Signed)
I have reviewed documentation from this visit and I agree with recommendations given.  Kahron Kauth G. Lucetta Baehr, MD  [facility]. [place] office.   

## 2018-02-21 ENCOUNTER — Other Ambulatory Visit: Payer: Self-pay | Admitting: *Deleted

## 2018-02-21 MED ORDER — INSULIN PEN NEEDLE 31G X 6 MM MISC: 3 refills | Status: DC

## 2018-03-10 ENCOUNTER — Telehealth: Payer: Self-pay | Admitting: Cardiovascular Disease

## 2018-03-10 NOTE — Telephone Encounter (Signed)
Called patient and advised of note. Patient wife verbalized understanding. They have appointment on Tuesday next week, and will still keep that appointment, but will call with BP readings in hopes it comes down.

## 2018-03-10 NOTE — Telephone Encounter (Signed)
New Message:    Pt c/o BP issue: STAT if pt c/o blurred vision, one-sided weakness or slurred speech  1. What are your last 5 BP readings? 175/105  2. Are you having any other symptoms (ex. Dizziness, headache, blurred vision, passed out)? Dizziness   3. What is your BP issue? Pt wife states his BP too high

## 2018-03-10 NOTE — Telephone Encounter (Signed)
I think he needs the amlodipine.  We will see how he does today.  I recommend that he takes at least 5 mg daily (half of the 10 mg tablet) starting tomorrow, call us back with blood pressure readings after about a week.  It will take at least that long for the medicine to kick back in

## 2018-03-10 NOTE — Telephone Encounter (Signed)
Returned call to wife (ok per DPR) who states patients blood pressure has been running high for the last week or so.  He has noticed some dizziness (which is what prompted to take BP), worse with movement, states he also has inner ear issues so is unsure if this is related.    BP this AM 175/105 before medications.   Other readings this week 156/95, 177/92, 168/91, wife unsure of HR.    She also states he stopped taking amlodipine 1 month ago, he thought this was making him feel "bad".   States he felt better when he stopped this medication.    Patient informed wife that he DID take the amlodipine this AM to try to get his BP down.    This was approximately 30 mins ago.   Wife also reports concerns with an episode that occurred 3 weeks ago.   Patient took a shower and was in the bedroom, became diaphoretic and very dizzy.  Wife washed him down with washcloth and eventually this resolved.  Denies CP or SOB during episode.   Patient is in background on the phone and states he feels fine other than the dizziness that increases with movement.  Wife states patient is very anxious, she can visibly see him shake some times.     Advised to rest and try to relax. recheck BP in a couple of hours since he took medication 30 mins ago and restarted the amlodipine today (10 mg).   Advised would route to MD for review for further recommendations/medication changes.     Wife request we return call around 3 pm

## 2018-03-14 ENCOUNTER — Ambulatory Visit: Payer: Medicare Other | Admitting: Cardiovascular Disease

## 2018-03-14 ENCOUNTER — Encounter: Payer: Self-pay | Admitting: Cardiovascular Disease

## 2018-03-14 VITALS — BP 130/82 | HR 77 | Ht 69.0 in | Wt 178.0 lb

## 2018-03-14 DIAGNOSIS — W57XXXD Bitten or stung by nonvenomous insect and other nonvenomous arthropods, subsequent encounter: Secondary | ICD-10-CM

## 2018-03-14 DIAGNOSIS — R29818 Other symptoms and signs involving the nervous system: Secondary | ICD-10-CM

## 2018-03-14 DIAGNOSIS — R2681 Unsteadiness on feet: Secondary | ICD-10-CM

## 2018-03-14 DIAGNOSIS — I5032 Chronic diastolic (congestive) heart failure: Secondary | ICD-10-CM | POA: Diagnosis not present

## 2018-03-14 DIAGNOSIS — I25810 Atherosclerosis of coronary artery bypass graft(s) without angina pectoris: Secondary | ICD-10-CM | POA: Diagnosis not present

## 2018-03-14 DIAGNOSIS — Z794 Long term (current) use of insulin: Secondary | ICD-10-CM

## 2018-03-14 DIAGNOSIS — I10 Essential (primary) hypertension: Secondary | ICD-10-CM

## 2018-03-14 DIAGNOSIS — I739 Peripheral vascular disease, unspecified: Secondary | ICD-10-CM | POA: Diagnosis not present

## 2018-03-14 DIAGNOSIS — E7800 Pure hypercholesterolemia, unspecified: Secondary | ICD-10-CM | POA: Diagnosis not present

## 2018-03-14 DIAGNOSIS — R251 Tremor, unspecified: Secondary | ICD-10-CM

## 2018-03-14 DIAGNOSIS — E118 Type 2 diabetes mellitus with unspecified complications: Secondary | ICD-10-CM

## 2018-03-14 MED ORDER — ALPRAZOLAM 0.5 MG PO TABS: 0.5000 mg | ORAL_TABLET | Freq: Every day | ORAL | 0 refills | Status: DC | PRN

## 2018-03-14 NOTE — Patient Instructions (Signed)
Medication Instructions: Dr Sallyanne Kuster recommends that you continue on your current medications as directed. Please refer to the Current Medication list given to you today.  Labwork: NONE ORDERED  Testing/Procedures: 1. CT of head wo Contrast - Non-Cardiac CT scanning, (CAT scanning), is a noninvasive, special x-ray that produces cross-sectional images of the body using x-rays and a computer. CT scans help physicians diagnose and treat medical conditions. For some CT exams, a contrast material is used to enhance visibility in the area of the body being studied. CT scans provide greater clarity and reveal more details than regular x-ray exams.   >> This has been ordered to be completed at [place] [facility] [address], [place] [address] [phone]  Follow-up: Dr Sallyanne Kuster recommends that you schedule a follow-up appointment in 6 months. You will receive a reminder letter in the mail two months in advance. If you don't receive a letter, please call our office to schedule the follow-up appointment.  If you need a refill on your cardiac medications before your next appointment, please call your pharmacy.   You have been referred to Dr Kathrynn Ducking, a neurologist, for unsteady gait and tremors.  [facility] [address], [place] [address] [phone]

## 2018-03-14 NOTE — Progress Notes (Signed)
Patient ID: [name], male   DOB: Dec 05, 1945, 72 y.o.   MRN: [id number]    Cardiology Office Note    Date:  03/14/2018   ID:  [name] Mar 03, 1946, MRN [id number]  PCP:  [name], [name], MD  Cardiologist:   Sanda Klein, MD   Chief Complaint  Patient presents with  . Follow-up    Feels shakey all the time and is having memory problems.  . Headache    Dizzy all the time.  . Fatigue    All the time.    History of Present Illness:  [name] is a 72 y.o. male with a long-standing history of coronary artery disease who returns for follow-up.  Although he does not have any specific cardiovascular complaints, letter is really not doing well.  He is very agitated and anxious all the time.  He has no desire to do the things that used to give him pleasure, including taking care of his yard or playing music with his friends.  He "gives out" when he tries to do anything.  He feels useless. Sertraline did not help at all, so he stopped it about a month ago.  He has difficulty falling asleep at night.  He thinks his memory is deteriorating, especially short-term memory.  Both [name] and his wife have noticed that his hand trembles all of the sputum.  His handwriting has also become more trembly.  He feels very unsteady when he walks.  At night he has started using a walker to the bathroom since he has a fall.  He associates the onset of his complaints with using Brilinta after the stent he received in February 2019.  He felt better after this medication was replaced with oral, but the same complaints gradually resurfaced.  He stopped taking his rosuvastatin.  He has also stopped taking sertraline.  He reports that glycemic control is excellent.  His last hemoglobin A1c was 6.6%.  He last had ultrasound for his abdominal aortic aneurysm/iliac artery aneurysm in mid July 2019.  He had an episode of elevated blood pressure a few days ago.  He restarted taking amlodipine 5 mg daily.  Today  he took 10 mg because he felt agitated.  He underwent four-vessel bypass surgery in 2006 (LIMA-LAD, SVG x 3 to Diagonal, OM and PDA). In 2015 received a bare metal stent to the proximal RCA (4.5 mm vessel). On September 08, 2017 he underwent cardiac catheterization which showed a long segment of severe stenosis in the SVG to OM (chronically occluded circumflex), patent LIMA to occluded LAD proximal RCA stent with 80% stenosis in the distal branch of the RCA.  March 1 he underwent revascularization of the SVG-OM with 2 drug-eluting stents.  He did not tolerate Brilinta and asked to be switched to clopidogrel. He has had EVAR for AAA amd received a stent in the large right common iliac artery aneurysm and is monitored at [facility] He has insulin requiring diabetes mellitus. He has chronically low HDL cholesterol, around 27 mg/dL.    Past Medical History:  Diagnosis Date  . Arthritis    "back" (04/05/2014)  . Atrial fibrillation ([place])   . Blood in stool   . Bradycardia- with decrease of BB some improvement 09/10/2017  . CAD (coronary artery disease)   . 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 Dr Darcey Nora - to LAD, SVG to diagonal, SVG to OM, SVG to PDA ;  b.  [facility] (9/15): ostial LAD occl,  ostial CFX 60%, prox AVCFX 95%, RCA 95%, S-PDA occl, S-OM/Dx with Dx limb occl and prox 60%, L-LAD patent, EF 35-40% wtih ant-lat and apical HK-AK >> PCI:  BMS to native RCA   . Diverticulosis   . Dizziness   . DM type 2 causing complication ([place]) 10/13/3152  . GERD (gastroesophageal reflux disease)   . Hypertension   . Iliac artery aneurysm ([place])   . Ischemic cardiomyopathy    a. EF 35-40% by LV gram 03/2014  . Joint pain   . Kidney stones   . Kidney stones   . Lower back pain Jan 2015  . Mixed hyperlipidemia 08/16/2013  . Myocardial infarction ([place]) 03/2014 X 2?  . S/P angioplasty with stent DES mid SVG to OM and DES to prox. SVG to OM, DAPT for lifetime 09/10/2017  . Type II diabetes mellitus ([place])     Past Surgical History:   Procedure Laterality Date  . CARDIAC CATHETERIZATION  2006   "before [facility]"  . CORONARY ANGIOPLASTY WITH STENT PLACEMENT  04/05/2014   "1"  . CORONARY ARTERY BYPASS GRAFT  2006   LIMA to LAD,SVG to diagonal,SVG to obtuse marginal,SVG to posterior descending  . CORONARY STENT INTERVENTION N/A 09/09/2017   Procedure: CORONARY STENT INTERVENTION;  Surgeon: Burnell Blanks, MD;  Location: [facility];  Service: Cardiovascular;  Laterality: N/A;  . CORONARY STENT PLACEMENT  09/09/2017  . CYSTOSCOPY W/ STONE MANIPULATION  1970's X 2  . CYSTOSCOPY W/ URETEROSCOPY W/ LITHOTRIPSY  ~ 2009  . ILIAC ARTERY STENT Left Aug. 10, 2011   Aorta to right ext iliac and left CIA stent repair   . LEFT HEART CATH AND CORS/GRAFTS ANGIOGRAPHY N/A 09/08/2017   Procedure: LEFT HEART CATH AND CORS/GRAFTS ANGIOGRAPHY;  Surgeon: [name], MD;  Location: [facility];  Service: Cardiovascular;  Laterality: N/A;  . LEFT HEART CATHETERIZATION WITH CORONARY/GRAFT ANGIOGRAM N/A 04/05/2014   Procedure: LEFT HEART CATHETERIZATION WITH [name];  Surgeon: [name], MD;  Location: [facility] C[facility];  Service: Cardiovascular;  Laterality: N/A;  . NM MYOCAR PERF WALL MOTION  11/18/2009   low risk  . TONSILLECTOMY AND ADENOIDECTOMY  1951    Outpatient Medications Prior to Visit  Medication Sig Dispense Refill  . amLODipine (NORVASC) 10 MG tablet Take 0.5 tablets (5 mg total) by mouth daily. 90 tablet 3  . aspirin 81 MG tablet Take 81 mg by mouth daily.     . clopidogrel (PLAVIX) 75 MG tablet Take 1 tablet (75 mg total) by mouth daily. 30 tablet 12  . insulin glargine (LANTUS) 100 unit/mL SOPN Inject 0.3 mLs (30 Units total) into the skin daily. 15 mL 2  . Insulin Pen Needle (ULTICARE MINI PEN NEEDLES) 31G X 6 MM MISC USE AS DIRECTED 100 each 3  . losartan (COZAAR) 50 MG tablet TAKE 1 TABLET BY MOUTH TWO  TIMES DAILY 180 tablet 0  . ondansetron (ZOFRAN) 4 MG tablet Take 1 tablet (4 mg  total) by mouth every 8 (eight) hours as needed for nausea or vomiting. 20 tablet 0  . pantoprazole (PROTONIX) 40 MG tablet TAKE 1 TABLET BY MOUTH  DAILY 90 tablet 1  . rosuvastatin (CRESTOR) 10 MG tablet Take 1 tablet (10 mg total) by mouth 4 (four) times a week. 44 tablet 3  . nitroGLYCERIN (NITROSTAT) 0.4 MG SL tablet Place 1 tablet (0.4 mg total) under the tongue every 5 (five) minutes as needed for chest pain. 90 tablet 3  .  sertraline (ZOLOFT) 50 MG tablet TAKE 1 TABLET DAILY (Patient not taking: Reported on 03/14/2018) 30 tablet 11   No facility-administered medications prior to visit.      Allergies:   Codeine and Valium   Social History   Socioeconomic History  . Marital status: Married    Spouse name: Not on file  . Number of children: Not on file  . Years of education: Not on file  . Highest education level: Not on file  Occupational History  . Not on file  Social Needs  . Financial resource strain: Not on file  . Food insecurity:    Worry: Not on file    Inability: Not on file  . Transportation needs:    Medical: Not on file    Non-medical: Not on file  Tobacco Use  . Smoking status: Former Smoker    Years: 2.00    Types: Cigars    Last attempt to quit: 09/09/2017    Years since quitting: 0.5  . Smokeless tobacco: Never Used  . Tobacco comment: states he smoked cigar   Substance and Sexual Activity  . Alcohol use: No  . Drug use: No  . Sexual activity: Yes  Lifestyle  . Physical activity:    Days per week: Not on file    Minutes per session: Not on file  . Stress: Not on file  Relationships  . Social connections:    Talks on phone: Not on file    Gets together: Not on file    Attends religious service: Not on file    Active member of club or organization: Not on file    Attends meetings of clubs or organizations: Not on file    Relationship status: Not on file  Other Topics Concern  . Not on file  Social History Narrative  . Not on file     Family  History:  The patient's family history includes Diabetes in his brother and mother; Heart disease in his mother, sister, and son; Hypertension in his mother.   ROS:   Please see the history of present illness.    ROS All other systems are reviewed and are negative  PHYSICAL EXAM:   VS:  BP 130/82 (BP Location: Right Arm, Patient Position: Sitting, Cuff Size: Normal)   Pulse 77   Ht 5\' 9"  (1.753 m)   Wt 178 lb (80.7 kg)   BMI 26.29 kg/m     General: Alert, oriented x3, no distress, he is unusually agitated and interrupts his wife frequently. Head: no evidence of trauma, PERRL, EOMI, no exophtalmos or lid lag, no myxedema, no xanthelasma; normal ears, nose and oropharynx Neck: normal jugular venous pulsations and no hepatojugular reflux; brisk carotid pulses without delay and no carotid bruits Chest: clear to auscultation, no signs of consolidation by percussion or palpation, normal fremitus, symmetrical and full respiratory excursions Cardiovascular: normal position and quality of the apical impulse, regular rhythm, normal first and second heart sounds, no murmurs, rubs or gallops Abdomen: no tenderness or distention, no masses by palpation, no abnormal pulsatility or arterial bruits, normal bowel sounds, no hepatosplenomegaly Extremities: no clubbing, cyanosis or edema; 2+ radial, ulnar and brachial pulses bilaterally; 2+ right femoral, posterior tibial and dorsalis pedis pulses; 2+ left femoral, posterior tibial and dorsalis pedis pulses; no subclavian or femoral bruits Neurological: fine intentional tremor appears to be present.  Walks very slowly with a careful gait. Psych:.  Appears anxious and mood appears depressed    Wt Readings from  Last 3 Encounters:  03/14/18 178 lb (80.7 kg)  01/24/18 175 lb (79.4 kg)  01/18/18 179 lb (81.2 kg)      Studies/Labs Reviewed:   EKG:  EKG is ordered today.  It shows sinus rhythm with rightward axis, no repolarization findings. Recent  Labs: BMET    Component Value Date/Time   NA 139 09/27/2017 1135   K 4.1 09/27/2017 1135   CL 102 09/27/2017 1135   CO2 23 09/27/2017 1135   GLUCOSE 119 (H) 09/27/2017 1135   GLUCOSE 129 (H) 09/10/2017 0443   BUN 16 09/27/2017 1135   CREATININE 1.13 09/27/2017 1135   CREATININE 1.03 04/03/2014 1648   CALCIUM 9.3 09/27/2017 1135   GFRNONAA 65 09/27/2017 1135   GFRAA 75 09/27/2017 1135     Lipid Panel     Component Value Date/Time   CHOL 170 09/05/2017 1201   TRIG 183 (H) 09/05/2017 1201   HDL 29 (L) 09/05/2017 1201   CHOLHDL 5.9 (H) 09/05/2017 1201   CHOLHDL 5 12/28/2016 0936   VLDL 53.0 (H) 12/28/2016 0936   LDLCALC 104 (H) 09/05/2017 1201   LDLDIRECT 74.0 12/28/2016 0936   CBC:    Component Value Date/Time   WBC 7.1 11/28/2017 1150   WBC 8.1 09/10/2017 0443   HGB 14.4 11/28/2017 1150   HCT 42.3 11/28/2017 1150   PLT 161 11/28/2017 1150   MCV 88 11/28/2017 1150   NEUTROABS 11.0 (H) 02/02/2014 0009   LYMPHSABS 0.7 02/02/2014 0009   MONOABS 0.8 02/02/2014 0009   EOSABS 0.0 02/02/2014 0009   BASOSABS 0.0 02/02/2014 0009    BMET    Component Value Date/Time   NA 139 09/27/2017 1135   K 4.1 09/27/2017 1135   CL 102 09/27/2017 1135   CO2 23 09/27/2017 1135   GLUCOSE 119 (H) 09/27/2017 1135   GLUCOSE 129 (H) 09/10/2017 0443   BUN 16 09/27/2017 1135   CREATININE 1.13 09/27/2017 1135   CREATININE 1.03 04/03/2014 1648   CALCIUM 9.3 09/27/2017 1135   GFRNONAA 65 09/27/2017 1135   GFRAA 75 09/27/2017 1135     ASSESSMENT:    1. 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   2. Chronic diastolic heart failure ([place])   3. Essential hypertension   4. PAD (peripheral artery disease) ([place])   5. Hypercholesterolemia   6. Type 2 diabetes mellitus with complication, with long-term current use of insulin ([place])   7. Tick bite, subsequent encounter   8. Tremors of nervous system   9. Unsteady gait   10. Other  symptoms and signs involving the nervous system      PLAN:  In order of problems listed above:  1. CAD: Had crescendo angina in February, resolved after the stent procedure.  Understands he needs to take dual antiplatelet therapy through next February. Because of complaints of fatigue we reduced the dose of carvedilol to 3.125 mg twice daily.  2. CHF: Left ventricular systolic function has returned to normal as of his most recent echocardiogram. NYHA functional class I.  Clinically euvolemic.  3. HTN: Amlodipine 5 mg daily as an antianginal and antihypertensive.  Blood pressure control is adequate. 4. PAD: History of EVAR stent graft for aorto-iliac aneurysm in 2011, being followed in the vascular surgery clinic.  Favorable findings on last scan in July 2019. 5. HLP: Currently off statin because of numerous noncardiac complaints.  In the long run we will have to find an effective and well-tolerated way to  bring his LDL cholesterol to 70 or less.  Until we clarify the reason for his recent neurological/psychiatric complaints, we will hold off statin therapy. 6. DM: Good glycemic control 7. Tick bite: Negative work-up for Lyme disease 8. Ataxia, tremor, fatigue, unsteady gait, short-term memory deficit: I have ordered a CT scan without contrast of his head and referred him to Neurology.  I must say that I still believe that many of his complaints are more psychological (Depression?  Generalized anxiety disorder?), rather than neurological.  No benefit from sertraline.  Nevertheless, it is possible he has an unusual presentation with early Parkinson's disease or dementia.  I did give him a very small supply of short acting sedatives since he is so anxious.  I asked his wife to watch him carefully when she is the first dose.  They are aware of the addictive potential of these medications.  Delno recalls that he did take Valium intermittently in the past.    Medication Adjustments/Labs and Tests  Ordered: Current medicines are reviewed at length with the patient today.  Concerns regarding medicines are outlined above.  Medication changes, Labs and Tests ordered today are listed in the Patient Instructions below. Patient Instructions  Medication Instructions: Dr Sallyanne Kuster recommends that you continue on your current medications as directed. Please refer to the Current Medication list given to you today.  Labwork: NONE ORDERED  Testing/Procedures: 1. CT of head wo Contrast - Non-Cardiac CT scanning, (CAT scanning), is a noninvasive, special x-ray that produces cross-sectional images of the body using x-rays and a computer. CT scans help physicians diagnose and treat medical conditions. For some CT exams, a contrast material is used to enhance visibility in the area of the body being studied. CT scans provide greater clarity and reveal more details than regular x-ray exams.   >> This has been ordered to be completed at [place] at [facility] [address] [place] [address] [phone]  Follow-up: Dr Sallyanne Kuster recommends that you schedule a follow-up appointment in 6 months. You will receive a reminder letter in the mail two months in advance. If you don't receive a letter, please call our office to schedule the follow-up appointment.  If you need a refill on your cardiac medications before your next appointment, please call your pharmacy.   You have been referred to Dr Kathrynn Ducking, a neurologist, for unsteady gait and tremors.  [facility] [place] [address] [phone]      Signed, Sanda Klein, MD  03/14/2018 12:06 PM    [address], [place], [place]  [address] Phone: [phone]; Fax: [phone]

## 2018-03-16 ENCOUNTER — Other Ambulatory Visit: Payer: Self-pay | Admitting: *Deleted

## 2018-03-16 MED ORDER — ROSUVASTATIN CALCIUM 10 MG PO TABS: 10.0000 mg | ORAL_TABLET | ORAL | 3 refills | Status: DC

## 2018-03-20 ENCOUNTER — Telehealth: Payer: Self-pay | Admitting: Cardiovascular Disease

## 2018-03-20 ENCOUNTER — Ambulatory Visit: Payer: Self-pay | Admitting: *Deleted

## 2018-03-20 NOTE — Telephone Encounter (Signed)
Pt scheduled with Dr. [name] tomorrow.   Dr. [name] - FYI. Thanks!

## 2018-03-20 NOTE — Telephone Encounter (Signed)
Spoke with wife and explained Dr Ellene Route was a neurosurgeon and Dr Jannifer Franklin a neurologist. Patient will keep appointment in 2 weeks with Dr Jannifer Franklin

## 2018-03-20 NOTE — Progress Notes (Signed)
ACUTE VISIT   HPI:  Chief Complaint  Patient presents with  . Dizziness    light-headedness that started 1 month ago off and on  . Hypertension    blood pressure has been elevated sometimes    Mr.Chipper W Strole is a 72 y.o. male, who is here today with his wife complaining of fatigue and concerned about elevated BP readings.  His wife is reporting slurred speech, head CT already arranged for today. Last week BP's 150-160's/90's, once it was 191/101.  Denies dyspnea,chest pain,palpitations,or headache.  Lab Results  Component Value Date   CREATININE 1.13 09/27/2017   BUN 16 09/27/2017   NA 139 09/27/2017   K 4.1 09/27/2017   CL 102 09/27/2017   CO2 23 09/27/2017   Having some dizziness/lightheadedness for the past month, problem started after episode of diaphoresis with no other associated symptom. He has history of vertigo. He is planning on arranging appointment with "audiologist."  States that he used to have problems with his ears, had myringotomy and tubes placed a couple years ago. He denies hearing loss or tinnitus.   Hands"shaking", worse when eating or with other manual activities. +Fhx for tremor, worse after taking cough medication about 2 weeks ago.  He is not sure if he has had this problem before.  Negative for numbness,tingling,or focal deficit. He thinks it may be related to anxiety., He states that he feels like shaking "inside" like nervious all there time.  Anxiety: He has been under more stress than usual, recovering from recent illness, CAD. He tried Sertraline but did not feel like it was helping,so discontinued. Recently Xanax 0.5 mg was added to take prn,he has not taken it.  Ear pressure,"no bad.", use to have vertigo and ear tubes,has been fine for years.  Non productive cough for 2 weeks.  He attributes all these new problems to Plavix.   Lab Results  Component Value Date   WBC 7.1 11/28/2017   HGB 14.4 11/28/2017   HCT  42.3 11/28/2017   MCV 88 11/28/2017   PLT 161 11/28/2017   Lab Results  Component Value Date   HGBA1C 6.6 (A) 01/10/2018     Review of Systems  Constitutional: Positive for fatigue. Negative for activity change, appetite change and fever.  HENT: Negative for nosebleeds, sore throat and trouble swallowing.   Eyes: Negative for redness and visual disturbance.  Respiratory: Positive for cough. Negative for shortness of breath and wheezing.   Cardiovascular: Negative for chest pain, palpitations and leg swelling.  Gastrointestinal: Positive for nausea. Negative for abdominal pain and vomiting.  Endocrine: Negative for polydipsia, polyphagia and polyuria.  Genitourinary: Negative for decreased urine volume, dysuria and hematuria.  Neurological: Positive for tremors. Negative for seizures, syncope, facial asymmetry, weakness and headaches.  Psychiatric/Behavioral: Negative for sleep disturbance. The patient is nervous/anxious.       Current Outpatient Medications on File Prior to Visit  Medication Sig Dispense Refill  . ALPRAZolam (XANAX) 0.5 MG tablet Take 1 tablet (0.5 mg total) by mouth daily as needed for anxiety. 20 tablet 0  . amLODipine (NORVASC) 10 MG tablet Take 0.5 tablets (5 mg total) by mouth daily. 90 tablet 3  . aspirin 81 MG tablet Take 81 mg by mouth daily.     . clopidogrel (PLAVIX) 75 MG tablet Take 1 tablet (75 mg total) by mouth daily. 30 tablet 12  . insulin glargine (LANTUS) 100 unit/mL SOPN Inject 0.3 mLs (30 Units total) into the skin  daily. 15 mL 2  . Insulin Pen Needle ([employer]ARE MINI PEN NEEDLES) 31G X 6 MM MISC USE AS DIRECTED 100 each 3  . losartan (COZAAR) 50 MG tablet TAKE 1 TABLET BY MOUTH TWO  TIMES DAILY 180 tablet 0  . ondansetron (ZOFRAN) 4 MG tablet Take 1 tablet (4 mg total) by mouth every 8 (eight) hours as needed for nausea or vomiting. 20 tablet 0  . pantoprazole (PROTONIX) 40 MG tablet TAKE 1 TABLET BY MOUTH  DAILY 90 tablet 1  . rosuvastatin  (CRESTOR) 10 MG tablet Take 1 tablet (10 mg total) by mouth 4 (four) times a week. 64 tablet 3  . nitroGLYCERIN (NITROSTAT) 0.4 MG SL tablet Place 1 tablet (0.4 mg total) under the tongue every 5 (five) minutes as needed for chest pain. 90 tablet 3   No current facility-administered medications on file prior to visit.      Past Medical History:  Diagnosis Date  . Arthritis    "back" (04/05/2014)  . Atrial fibrillation ([place])   . Blood in stool   . Bradycardia- with decrease of BB some improvement 09/10/2017  . CAD (coronary artery disease)   . 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 Dr Darcey Nora - to LAD, SVG to diagonal, SVG to OM, SVG to PDA ;  b.  LHC (9/15): ostial LAD occl, ostial CFX 60%, prox AVCFX 95%, RCA 95%, S-PDA occl, S-OM/Dx with Dx limb occl and prox 60%, L-LAD patent, EF 35-40% wtih ant-lat and apical HK-AK >> PCI:  BMS to native RCA   . Diverticulosis   . Dizziness   . DM type 2 causing complication ([place]) 11/16/8500  . GERD (gastroesophageal reflux disease)   . Hypertension   . Iliac artery aneurysm ([place])   . Ischemic cardiomyopathy    a. EF 35-40% by LV gram 03/2014  . Joint pain   . Kidney stones   . Kidney stones   . Lower back pain Jan 2015  . Mixed hyperlipidemia 08/16/2013  . Myocardial infarction ([place]) 03/2014 X 2?  . S/P angioplasty with stent DES mid SVG to OM and DES to prox. SVG to OM, DAPT for lifetime 09/10/2017  . Type II diabetes mellitus ([facility])    Allergies  Allergen Reactions  . Codeine Nausea Only and Other (See Comments)    Cannot take on empty stomach.  . Valium Nausea Only and Other (See Comments)    Cannot take on empty stomach.    Social History   Socioeconomic History  . Marital status: Married    Spouse name: Not on file  . Number of children: Not on file  . Years of education: Not on file  . Highest education level: Not on file  Occupational History  . Not on file  Social Needs  . Financial resource strain: Not on file  . Food insecurity:     Worry: Not on file    Inability: Not on file  . Transportation needs:    Medical: Not on file    Non-medical: Not on file  Tobacco Use  . Smoking status: Former Smoker    Years: 2.00    Types: Cigars    Last attempt to quit: 09/09/2017    Years since quitting: 0.5  . Smokeless tobacco: Never Used  . Tobacco comment: states he smoked cigar   Substance and Sexual Activity  . Alcohol use: No  . Drug use: No  . Sexual activity: Yes  Lifestyle  . Physical activity:  Days per week: Not on file    Minutes per session: Not on file  . Stress: Not on file  Relationships  . Social connections:    Talks on phone: Not on file    Gets together: Not on file    Attends religious service: Not on file    Active member of club or organization: Not on file    Attends meetings of clubs or organizations: Not on file    Relationship status: Not on file  Other Topics Concern  . Not on file  Social History Narrative  . Not on file    Vitals:   03/21/18 1031  BP: 130/80  Pulse: 78  Resp: 16  Temp: 97.6 F (36.4 C)  SpO2: 97%   Body mass index is 26.16 kg/m.   Physical Exam  Nursing note and vitals reviewed. Constitutional: He is oriented to person, place, and time. He appears well-developed and well-nourished. No distress.  HENT:  Head: Normocephalic and atraumatic.  Mouth/Throat: Oropharynx is clear and moist and mucous membranes are normal.  Eyes: Pupils are equal, round, and reactive to light. Conjunctivae are normal.  Cardiovascular: Normal rate and regular rhythm.  No murmur heard. Respiratory: Effort normal and breath sounds normal. No respiratory distress.  GI: Soft. He exhibits no mass. There is no hepatomegaly. There is no tenderness.  Musculoskeletal: He exhibits no edema.  Lymphadenopathy:    He has no cervical adenopathy.  Neurological: He is alert and oriented to person, place, and time. He has normal strength. No cranial nerve deficit. Gait normal.  Short phrases  at the time.I do not appreciate slurred speech today.  Skin: Skin is warm. No rash noted. No erythema.  Psychiatric: His mood appears anxious.  Well groomed, good eye contact.    ASSESSMENT AND PLAN:   Mr. Leiam was seen today for dizziness and hypertension.  Orders Placed This Encounter  Procedures  . DG Chest 2 View  . Basic metabolic panel  . CBC    Lab Results  Component Value Date   WBC 8.8 03/21/2018   HGB 15.3 03/21/2018   HCT 44.4 03/21/2018   MCV 84.0 03/21/2018   PLT 267.0 03/21/2018   Lab Results  Component Value Date   CREATININE 1.38 03/21/2018   BUN 20 03/21/2018   NA 136 03/21/2018   K 4.2 03/21/2018   CL 101 03/21/2018   CO2 28 03/21/2018    Other fatigue  Possible etiologies discussed. He thinks it is related to Plavix. Further recommendations will be given according to labs/imaging results.  Hypertension Re-checked: 130/75. Adequately controlled. No changes in current management for now. Continue monitoring BP. Low salt diet to continue.   Anxiety disorder He agrees with trying a different medication, Celexa 10 mg. Continue Xanax 0.5 mg daily as needed. Some side effects discussed. F/U in 5 weeks,before if needed.   Tremor, unspecified Mild hand tremor, R>L. ? Essential tremor,anxiety.   Cough  Lung auscultation negative. Possible etiologies discussed, ? GERD. CXR ordered.    Return in about 5 weeks (around 04/25/2018) for Tremor, lightheadedness .     Qiana Landgrebe G. [name], MD  [facility]. [place] office.

## 2018-03-20 NOTE — Telephone Encounter (Signed)
New Message   Pt's wife is calling, wanting to know if they can see Dr. Kristeen Miss instead of Dr. Jannifer Franklin. Please call

## 2018-03-20 NOTE — Telephone Encounter (Signed)
Patient and wife calling. Patient has been experiencing high blood pressure along with dizziness for at least one month. Pressure today 126/84 hr 83. Pressures last week 157/91, 155/92, 160's/90's.Stated he has no energy and feels lightheaded when he gets up. Around one month ago, he had a sweating episode with vertigo at night. He was not seen for this episode. Since, he and his wife has noticed his speech sounds different/speaks in short phrases with shortness of breath/fatigue-weakness feels he leans with walking having to use a walker at times. Denies CP/one sided weakness/numbness.Mr. Bollier stated he feels nervous/anxious all the time and his body shakes at times.  Has had a cough since then and wife says his voice sounds hoarse. Blood sugars have been running in the high 100's over the last 2 weeks. He was seen by cardiology last week, EKG was okay, then  ordered CT head tomorrow (Tuesday) 2:50p. Has a neurology appointment on 9/23 with Dr. Jannifer Franklin.   Reason for Disposition . [1] Taking BP medications AND [2] feels is having side effects (e.g., impotence, cough, dizzy upon standing)  Answer Assessment - Initial Assessment Questions 1. BLOOD PRESSURE: "What is the blood pressure?" "Did you take at least two measurements 5 minutes apart?"     126/84 p. 83 2. ONSET: "When did you take your blood pressure?"     This morning 3. HOW: "How did you obtain the blood pressure?" (e.g., visiting nurse, automatic home BP monitor)     Home monitor 4. HISTORY: "Do you have a history of high blood pressure?"     yes 5. MEDICATIONS: "Are you taking any medications for blood pressure?" "Have you missed any doses recently?"     yes 6. OTHER SYMPTOMS: "Do you have any symptoms?" (e.g., headache, chest pain, blurred vision, difficulty breathing, weakness)     Some short of breath. 7. PREGNANCY: "Is there any chance you are pregnant?" "When was your last menstrual period?"     na  Protocols used: HIGH BLOOD  PRESSURE-A-AH

## 2018-03-21 ENCOUNTER — Encounter: Payer: Self-pay | Admitting: Family Medicine

## 2018-03-21 ENCOUNTER — Ambulatory Visit (INDEPENDENT_AMBULATORY_CARE_PROVIDER_SITE_OTHER): Payer: Medicare Other | Admitting: Family Medicine

## 2018-03-21 ENCOUNTER — Ambulatory Visit
Admission: RE | Admit: 2018-03-21 | Discharge: 2018-03-21 | Disposition: A | Discharge status: Home / Self Care | Payer: Medicare Other | Source: Ambulatory Visit | Attending: Cardiovascular Disease | Admitting: Cardiovascular Disease

## 2018-03-21 VITALS — BP 130/80 | HR 78 | Temp 97.6°F | Resp 16 | Ht 69.0 in | Wt 177.1 lb

## 2018-03-21 DIAGNOSIS — R05 Cough: Secondary | ICD-10-CM

## 2018-03-21 DIAGNOSIS — F419 Anxiety disorder, unspecified: Secondary | ICD-10-CM | POA: Diagnosis not present

## 2018-03-21 DIAGNOSIS — R5383 Other fatigue: Secondary | ICD-10-CM | POA: Diagnosis not present

## 2018-03-21 DIAGNOSIS — R059 Cough, unspecified: Secondary | ICD-10-CM

## 2018-03-21 DIAGNOSIS — R2681 Unsteadiness on feet: Secondary | ICD-10-CM

## 2018-03-21 DIAGNOSIS — R251 Tremor, unspecified: Secondary | ICD-10-CM | POA: Diagnosis not present

## 2018-03-21 DIAGNOSIS — R29818 Other symptoms and signs involving the nervous system: Secondary | ICD-10-CM

## 2018-03-21 DIAGNOSIS — I10 Essential (primary) hypertension: Secondary | ICD-10-CM

## 2018-03-21 DIAGNOSIS — R413 Other amnesia: Secondary | ICD-10-CM | POA: Diagnosis not present

## 2018-03-21 LAB — BASIC METABOLIC PANEL
BUN: 20 mg/dL (ref 6–23)
CO2: 28 mEq/L (ref 19–32)
Calcium: 9.4 mg/dL (ref 8.4–10.5)
Chloride: 101 mEq/L (ref 96–112)
Creatinine, Ser: 1.38 mg/dL (ref 0.40–1.50)
GFR: 53.81 mL/min — ABNORMAL LOW (ref >60.00)
Glucose, Bld: 201 mg/dL — ABNORMAL HIGH (ref 70–99)
Potassium: 4.2 mEq/L (ref 3.5–5.1)
Sodium: 136 mEq/L (ref 135–145)

## 2018-03-21 LAB — CBC
HCT: 44.4 % (ref 39.0–52.0)
Hemoglobin: 15.3 g/dL (ref 13.0–17.0)
MCHC: 34.5 g/dL (ref 30.0–36.0)
MCV: 84 fl (ref 78.0–100.0)
Platelets: 267 10*3/uL (ref 150.0–400.0)
RBC: 5.29 Mil/uL (ref 4.22–5.81)
RDW: 12.6 % (ref 11.5–15.5)
WBC: 8.8 10*3/uL (ref 4.0–10.5)

## 2018-03-21 MED ORDER — CITALOPRAM HYDROBROMIDE 10 MG PO TABS: 10.0000 mg | ORAL_TABLET | Freq: Every day | ORAL | 1 refills | Status: DC

## 2018-03-21 NOTE — Assessment & Plan Note (Signed)
Re-checked: 130/75. Adequately controlled. No changes in current management for now. Continue monitoring BP. Low salt diet to continue.

## 2018-03-21 NOTE — Patient Instructions (Addendum)
A few things to remember from today's visit:   Essential hypertension - Plan: Basic metabolic panel, CBC  Other fatigue  Celexa added. No changes in blood pressure meds.  Please be sure medication list is accurate. If a new problem present, please set up appointment sooner than planned today.

## 2018-03-21 NOTE — Assessment & Plan Note (Signed)
He agrees with trying a different medication, Celexa 10 mg. Continue Xanax 0.5 mg daily as needed. Some side effects discussed. F/U in 5 weeks,before if needed.

## 2018-03-22 ENCOUNTER — Telehealth: Payer: Self-pay | Admitting: Neurology

## 2018-03-22 NOTE — Telephone Encounter (Signed)
   This patient is to be seen as a new patient on 23 September, I will discuss the CT report with him on that visit.   CT head 03/22/18:  IMPRESSION: No acute abnormality.  Atrophy and mild chronic microvascular ischemic change.  Atherosclerosis.  Mucous retention cyst or polyp right maxillary sinus.

## 2018-03-27 ENCOUNTER — Encounter: Payer: Self-pay | Admitting: Family Medicine

## 2018-04-03 ENCOUNTER — Telehealth: Payer: Self-pay | Admitting: Neurology

## 2018-04-03 ENCOUNTER — Encounter: Payer: Self-pay | Admitting: Neurology

## 2018-04-03 ENCOUNTER — Ambulatory Visit: Payer: Medicare Other | Admitting: Neurology

## 2018-04-03 ENCOUNTER — Other Ambulatory Visit: Payer: Self-pay

## 2018-04-03 VITALS — BP 120/82 | HR 79 | Ht 69.0 in | Wt 178.0 lb

## 2018-04-03 DIAGNOSIS — R251 Tremor, unspecified: Secondary | ICD-10-CM | POA: Diagnosis not present

## 2018-04-03 DIAGNOSIS — F419 Anxiety disorder, unspecified: Secondary | ICD-10-CM

## 2018-04-03 DIAGNOSIS — R413 Other amnesia: Secondary | ICD-10-CM

## 2018-04-03 MED ORDER — ESCITALOPRAM OXALATE 10 MG PO TABS: 10.0000 mg | ORAL_TABLET | Freq: Every day | ORAL | 3 refills | Status: DC

## 2018-04-03 NOTE — Telephone Encounter (Signed)
[employer] Medicare order sent to GI. No auth they will reach out to the pt to schedule.  °

## 2018-04-03 NOTE — Progress Notes (Signed)
Thank you so much, [name]! [name]

## 2018-04-03 NOTE — Progress Notes (Signed)
Reason for visit: Tremor, memory problems  Referring physician: Dr. Baldo Daub is a 72 y.o. male  History of present illness:  [name] is a 72 year old white male with a history of diabetes and coronary artery disease and atrial fibrillation.  The patient indicates that he had a coronary artery stent placed in March 2019, following this procedure, he has had persistent sensations of anxiety and an internal sensation of tremulousness.  He occasionally will have mild action tremors manifested in the hands, may have some tremor when he tries to feed himself.  The patient feels shaky and nervous all the time, he has a sensation of depression and anxiety, he does not feel like doing anything that he used to enjoy.  He sleeps more at night and during the day.  He has no energy.  Approximately 2 months ago, the patient had an event where he became nervous, shaky, sweaty and dizzy.  The patient felt quite woozy, he did not have true vertigo.  He claims that he has had a history of inner ear disease in the past and this sensation was different.  The patient was unsteady on his feet, he basically spent about 3 days in bed because he felt that he could not walk well.  He was using a walker to get to the bathroom at night.  The symptoms lasted about 1 month and then eventually cleared.  The patient denied a headache around the time of onset, he did report some difficulty with focusing with his vision but he denied double vision.  He had some slight alteration in speech, possible slurring of speech.  He has had some worsening memory and concentration, he has word finding problems at times.  The patient reports no focal numbness or weakness of the face, arms, legs.  He does report some numbness in both hands since the stent placement in March 2019.  The patient was placed on Celexa but only took the medication and low-dose for 2 weeks and then stop the drug because he did not feel it was helping him.   The patient did not check a blood sugar at onset of the event 2 months ago with increased tremor and sweats.  He did feel queasy but did not vomit.  He believes that his balance problems and the dizziness have fully resolved at this point.  He continues to feel nervous and jittery.  He is sent to this office for an evaluation.  The patient has not given up doing anything because of memory, he still drives a car without difficulty, he manages his medications and appointments, but occasionally he may forget whether or not he took a pill or not.  Past Medical History:  Diagnosis Date  . Arthritis    "back" (04/05/2014)  . Atrial fibrillation ([place])   . Blood in stool   . Bradycardia- with decrease of BB some improvement 09/10/2017  . CAD (coronary artery disease)   . 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 Dr Darcey Nora - to LAD, SVG to diagonal, SVG to OM, SVG to PDA ;  b.  LHC (9/15): ostial LAD occl, ostial CFX 60%, prox AVCFX 95%, RCA 95%, S-PDA occl, S-OM/Dx with Dx limb occl and prox 60%, L-LAD patent, EF 35-40% wtih ant-lat and apical HK-AK >> PCI:  BMS to native RCA   . Diverticulosis   . Dizziness   . DM type 2 causing complication ([place]) 08/14/7626  . GERD (gastroesophageal reflux disease)   .  Hypertension   . Iliac artery aneurysm ([place])   . Ischemic cardiomyopathy    a. EF 35-40% by LV gram 03/2014  . Joint pain   . Kidney stones   . Kidney stones   . Lower back pain Jan 2015  . Mixed hyperlipidemia 08/16/2013  . Myocardial infarction ([place]) 03/2014 X 2?  . S/P angioplasty with stent DES mid SVG to OM and DES to prox. SVG to OM, DAPT for lifetime 09/10/2017  . Type II diabetes mellitus ([place])     Past Surgical History:  Procedure Laterality Date  . CARDIAC CATHETERIZATION  2006   "before OHS"  . CORONARY ANGIOPLASTY WITH STENT PLACEMENT  04/05/2014   "1"  . CORONARY ARTERY BYPASS GRAFT  2006   LIMA to LAD,SVG to diagonal,SVG to obtuse marginal,SVG to posterior descending  . CORONARY STENT  INTERVENTION N/A 09/09/2017   Procedure: CORONARY STENT INTERVENTION;  Surgeon: Burnell Blanks, MD;  Location: [facility];  Service: Cardiovascular;  Laterality: N/A;  . CORONARY STENT PLACEMENT  09/09/2017  . CYSTOSCOPY W/ STONE MANIPULATION  1970's X 2  . CYSTOSCOPY W/ URETEROSCOPY W/ LITHOTRIPSY  ~ 2009  . ILIAC ARTERY STENT Left Aug. 10, 2011   Aorta to right ext iliac and left CIA stent repair   . LEFT HEART CATH AND CORS/GRAFTS ANGIOGRAPHY N/A 09/08/2017   Procedure: LEFT HEART CATH AND CORS/GRAFTS ANGIOGRAPHY;  Surgeon: [name], MD;  Location: [facility];  Service: Cardiovascular;  Laterality: N/A;  . LEFT HEART CATHETERIZATION WITH CORONARY/GRAFT ANGIOGRAM N/A 04/05/2014   Procedure: LEFT HEART CATHETERIZATION WITH [name];  Surgeon: [name], MD;  Location: [facility];  Service: Cardiovascular;  Laterality: N/A;  . NM MYOCAR PERF WALL MOTION  11/18/2009   low risk  . TONSILLECTOMY AND ADENOIDECTOMY  1951    Family History  Problem Relation Age of Onset  . Diabetes Mother   . Hypertension Mother   . Heart disease Mother        AAA  . Heart disease Sister   . Heart disease Son        Heart Disease before age 46  . Diabetes Brother     Social history:  reports that he quit smoking about 6 months ago. His smoking use included cigars. He quit after 2.00 years of use. He has never used smokeless tobacco. He reports that he does not drink alcohol or use drugs.  Medications:  Prior to Admission medications   Medication Sig Start Date End Date Taking? Authorizing Provider  amLODipine (NORVASC) 10 MG tablet Take 0.5 tablets (5 mg total) by mouth daily. 11/18/17   Croitoru, Mihai, MD  aspirin 81 MG tablet Take 81 mg by mouth daily.     [provider]  citalopram (CELEXA) 10 MG tablet Take 1 tablet (10 mg total) by mouth daily. 03/21/18   [name], [name], MD  clopidogrel (PLAVIX) 75 MG tablet Take 1 tablet (75 mg total) by  mouth daily. 01/04/18 01/06/19  Croitoru, Mihai, MD  insulin glargine (LANTUS) 100 unit/mL SOPN Inject 0.3 mLs (30 Units total) into the skin daily. 01/10/18   [name], [name], MD  Insulin Pen Needle Flossie Buffy MINI PEN NEEDLES) 31G X 6 MM MISC USE AS DIRECTED 02/21/18   [name], [name], MD  losartan (COZAAR) 50 MG tablet TAKE 1 TABLET BY MOUTH TWO  TIMES DAILY 12/09/17   Croitoru, Mihai, MD  nitroGLYCERIN (NITROSTAT) 0.4 MG SL tablet Place 1 tablet (  0.4 mg total) under the tongue every 5 (five) minutes as needed for chest pain. 09/10/17 12/09/17  Isaiah Serge, NP  ondansetron (ZOFRAN) 4 MG tablet Take 1 tablet (4 mg total) by mouth every 8 (eight) hours as needed for nausea or vomiting. 01/10/18   [name], [name], MD  pantoprazole (PROTONIX) 40 MG tablet TAKE 1 TABLET BY MOUTH  DAILY 12/09/17   [name], [name], MD  rosuvastatin (CRESTOR) 10 MG tablet Take 1 tablet (10 mg total) by mouth 4 (four) times a week. 03/16/18 02/15/19  Croitoru, Dani Gobble, MD      Allergies  Allergen Reactions  . Codeine Nausea Only and Other (See Comments)    Cannot take on empty stomach.  . Valium Nausea Only and Other (See Comments)    Cannot take on empty stomach.    ROS:  Out of a complete 14 system review of symptoms, the patient complains only of the following symptoms, and all other reviewed systems are negative.  Chills, fatigue Hearing loss Cough Feeling cold Memory loss, confusion, weakness, slurred speech, tremor Depression, anxiety, too much sleep, decreased energy, disinterest in activities Insomnia at times, sleepiness  Blood pressure 120/82, pulse 79, height 5\' 9"  (1.753 m), weight 178 lb (80.7 kg), SpO2 98 %.  Physical Exam  General: The patient is alert and cooperative at the time of the examination.  Eyes: Pupils are equal, round, and reactive to light. Discs are flat bilaterally.  Neck: The neck is supple, no carotid bruits are noted.  Respiratory: The respiratory examination is  clear.  Cardiovascular: The cardiovascular examination reveals a regular rate and rhythm, no obvious murmurs or rubs are noted.  Skin: Extremities are without significant edema.  Neurologic Exam  Mental status: The patient is alert and oriented x 3 at the time of the examination. The Mini-Mental status examination done today shows a total score 21/30.  Cranial nerves: Facial symmetry is present. There is good sensation of the face to pinprick and soft touch bilaterally. The strength of the facial muscles and the muscles to head turning and shoulder shrug are normal bilaterally. Speech is well enunciated, no aphasia or dysarthria is noted. Extraocular movements are full. Visual fields are full. The tongue is midline, and the patient has symmetric elevation of the soft palate. No obvious hearing deficits are noted.  Motor: The motor testing reveals 5 over 5 strength of all 4 extremities. Good symmetric motor tone is noted throughout.  Sensory: Sensory testing is intact to pinprick, soft touch, vibration sensation, and position sense on all 4 extremities, with exception of a stocking pattern pinprick sensory deficit across the ankles bilaterally. No evidence of extinction is noted.  Coordination: Cerebellar testing reveals good finger-nose-finger and heel-to-shin bilaterally.  Gait and station: Gait is normal. Tandem gait is normal. Romberg is negative. No drift is seen.  Reflexes: Deep tendon reflexes are symmetric and normal bilaterally, with exception of decreased ankle jerk reflexes bilaterally. Toes are downgoing bilaterally.   CT head 03/22/18:  IMPRESSION: No acute abnormality.  Atrophy and mild chronic microvascular ischemic change.  Atherosclerosis.  Mucous retention cyst or polyp right maxillary sinus.  * CT scan images were reviewed online. I agree with the written report.    Assessment/Plan:  1.  Reported memory disturbance  2.  Sudden onset dizziness, gait  disturbance, resolved  3.  Anxiety, depression  4.  Sensation of internal tremor  The patient has had onset of anxiety and depression after a cardiac stent placement in  March 2019.  He had onset of an event 2 months ago with sweats, increased tremor and anxiety, dizziness, and gait disturbance.  This event could have represented a small stroke event.  He did not check blood sugars around the time of the onset of symptoms.  The patient will be sent for MRI of the brain, CT of the brain did not show any definite evidence of a stroke event.  The patient will be placed back on an SSRI antidepressant medication, we will start Lexapro starting at 10 mg daily and work up to 20 mg daily.  The patient will follow-up in 4 months.  We will follow the memory issues over time, this could potentially be a manifestation of depression and anxiety.  The patient has had blood work done included a thyroid profile and B12 level that were relatively unremarkable.  Jill Alexanders MD 04/03/2018 8:05 AM  [facility] [address] [place], [place] [address]  Phone [phone] Fax [phone]

## 2018-04-03 NOTE — Patient Instructions (Signed)
We will start Lexapro 10 mg a day.

## 2018-04-11 ENCOUNTER — Other Ambulatory Visit: Payer: Self-pay | Admitting: Cardiovascular Disease

## 2018-04-11 ENCOUNTER — Other Ambulatory Visit: Payer: Self-pay | Admitting: Family Medicine

## 2018-04-11 ENCOUNTER — Ambulatory Visit
Admission: RE | Admit: 2018-04-11 | Discharge: 2018-04-11 | Disposition: A | Discharge status: Home / Self Care | Payer: Medicare Other | Source: Ambulatory Visit | Attending: Neurology | Admitting: Neurology

## 2018-04-11 ENCOUNTER — Ambulatory Visit (INDEPENDENT_AMBULATORY_CARE_PROVIDER_SITE_OTHER): Payer: Medicare Other | Admitting: *Deleted

## 2018-04-11 DIAGNOSIS — Z23 Encounter for immunization: Secondary | ICD-10-CM | POA: Diagnosis not present

## 2018-04-11 DIAGNOSIS — R413 Other amnesia: Secondary | ICD-10-CM

## 2018-04-12 ENCOUNTER — Telehealth: Payer: Self-pay | Admitting: Neurology

## 2018-04-12 NOTE — Telephone Encounter (Signed)
  I called the patient.  The MRI shows a moderate level of small vessel disease and some atrophy, this could correlate with his memory issues.  The patient claims that he ran out of Plavix 3 days ago and now he feels better with his nervousness.  I have instructed him to restart the Plavix when he gets his prescription, if the nervousness comes back he may need to call his cardiology doctor.  MRI brain 04/12/18:  IMPRESSION: This MRI of the brain without contrast shows the following: 1.    There is atrophy that is much more pronounced in the frontal and temporal insular region, left greater than right.  Although nonspecific, this can be seen in degenerative processes including frontotemporal dementia, Lewy body disease and less likely Alzheimer's disease. 2.    Moderate chronic microvascular ischemic changes. 3.    Two microhemorrhages in the subcortical deep white matter.  This is nonspecific and, with only 2 foci, is likely an incidental finding.  Early cerebral amyloid angiopathy cannot be ruled out. 4.    There are no acute findings.

## 2018-04-13 ENCOUNTER — Other Ambulatory Visit: Payer: Self-pay

## 2018-04-13 ENCOUNTER — Telehealth: Payer: Self-pay | Admitting: Cardiovascular Disease

## 2018-04-13 MED ORDER — CLOPIDOGREL BISULFATE 75 MG PO TABS: 75.0000 mg | ORAL_TABLET | Freq: Every day | ORAL | 12 refills | Status: DC

## 2018-04-13 NOTE — Telephone Encounter (Signed)
Brilinta and Plavix are the only options. He should be on one or the other through September 10 2018. MCr

## 2018-04-13 NOTE — Telephone Encounter (Signed)
Pt advised Dr. Victorino December recommendations. Will retstart Plavix today asap.

## 2018-04-13 NOTE — Telephone Encounter (Signed)
New Message          Pt c/o medication issue:  1. Name of Medication: Plavix  2. How are you currently taking this medication (dosage and times per day)? Once a day  3. Are you having a reaction (difficulty breathing--STAT)? Shaking and nervousness   4. What is your medication issue? Patient is having a reaction to the medication. Pls call and advise.

## 2018-04-13 NOTE — Telephone Encounter (Signed)
Spoke with the pt and he is reporting that he took himself off of Plavix for over a week and reports that his nervousness and jittery feeling that he has been dealing with has been better, but he also reports that he recently was started on Lexapro.  I advised him that he may be feeling better because of the Lexapro and not being off of the plavix but he is very reluctant to start the Plavix back even after I advised him that he could just try it back and see if the symptoms return. He would like other options because he feels strongly that he is not tolerating the Plavix.[name] He said his nervousness was so bad that he could not even do his normal ADL's.. I advised him that I will forward to Dr. Sallyanne Kuster and let him know what is going on and see what he recommends. Pt also states that he cannot take Brilinta since Dr.C tried him on that previously too.

## 2018-04-24 NOTE — Telephone Encounter (Signed)
Follow Up:     Please call, wants to talk tou about Plavix.

## 2018-04-24 NOTE — Telephone Encounter (Signed)
No other solutions. He is 6 months s/p stent, therefore beyond the highest risk period for clotting. Ideally, he should be on it for 12 months (through next March), but if it is causing him so much distress, just stop it. MCr

## 2018-04-24 NOTE — Telephone Encounter (Signed)
Spoke with pt, he called to report since restarting the plavix he is shaking all over like he did with the brilinta. He reports not being cold but shaking so bad he can not hold the utensil to eat. He is taking the plavix daily but states he can not handle this much longer. Will forward to dr croitoru to review and advise.

## 2018-04-25 ENCOUNTER — Ambulatory Visit: Payer: Medicare Other | Admitting: Family Medicine

## 2018-04-25 NOTE — Telephone Encounter (Signed)
Spoke with pt, aware of dr croitoru's recommendations.  

## 2018-06-16 ENCOUNTER — Other Ambulatory Visit: Payer: Self-pay | Admitting: Cardiovascular Disease

## 2018-06-16 ENCOUNTER — Other Ambulatory Visit: Payer: Self-pay | Admitting: Family Medicine

## 2018-06-27 ENCOUNTER — Other Ambulatory Visit: Payer: Self-pay | Admitting: Family Medicine

## 2018-06-27 DIAGNOSIS — E1140 Type 2 diabetes mellitus with diabetic neuropathy, unspecified: Secondary | ICD-10-CM

## 2018-06-27 DIAGNOSIS — Z794 Long term (current) use of insulin: Principal | ICD-10-CM

## 2018-07-03 ENCOUNTER — Ambulatory Visit (INDEPENDENT_AMBULATORY_CARE_PROVIDER_SITE_OTHER): Payer: Medicare Other | Admitting: Family Medicine

## 2018-07-03 ENCOUNTER — Encounter: Payer: Self-pay | Admitting: Family Medicine

## 2018-07-03 VITALS — BP 130/80 | HR 74 | Temp 98.3°F | Resp 12 | Ht 69.0 in | Wt 184.1 lb

## 2018-07-03 DIAGNOSIS — Z794 Long term (current) use of insulin: Secondary | ICD-10-CM

## 2018-07-03 DIAGNOSIS — G4700 Insomnia, unspecified: Secondary | ICD-10-CM

## 2018-07-03 DIAGNOSIS — I10 Essential (primary) hypertension: Secondary | ICD-10-CM

## 2018-07-03 DIAGNOSIS — R251 Tremor, unspecified: Secondary | ICD-10-CM | POA: Insufficient documentation

## 2018-07-03 DIAGNOSIS — E1140 Type 2 diabetes mellitus with diabetic neuropathy, unspecified: Secondary | ICD-10-CM | POA: Diagnosis not present

## 2018-07-03 DIAGNOSIS — F419 Anxiety disorder, unspecified: Secondary | ICD-10-CM

## 2018-07-03 LAB — POCT GLYCOSYLATED HEMOGLOBIN (HGB A1C): Hemoglobin A1C: 7.5 % — AB (ref 4.0–5.6)

## 2018-07-03 MED ORDER — PROPRANOLOL HCL 20 MG PO TABS: 20.0000 mg | ORAL_TABLET | Freq: Two times a day (BID) | ORAL | 1 refills | Status: DC

## 2018-07-03 MED ORDER — INSULIN GLARGINE 100 UNIT/ML SOLOSTAR PEN: PEN_INJECTOR | SUBCUTANEOUS | 0 refills | Status: DC

## 2018-07-03 NOTE — Assessment & Plan Note (Signed)
Propranolol may help. He has been on Sertraline and Xanax, recently on Lexapro.He has not tolerated meds well. Psychotherapy recommended. We will re-evaluate in 2 months. Instructed about warning signs.

## 2018-07-03 NOTE — Assessment & Plan Note (Signed)
Today BP adequately controlled,he is reporting elevated BP's at home. No changes in Amlodipine. Propranolol 20 mg bid started. Some side effects discussed. Continue monitoring BP. F/U in 2 months.

## 2018-07-03 NOTE — Assessment & Plan Note (Signed)
?   Essential tremor. Propranolol recommended, 20 mg bid. F/U in 2 months,before if needed.

## 2018-07-03 NOTE — Assessment & Plan Note (Signed)
HgA1C is not at goal. Lantus increased from 30 U to 35 U. Regular exercise and healthy diet with avoidance of added sugar food intake is an important part of treatment and recommended. Annual eye exam, periodic dental and foot care recommended. F/U in 5-6 months

## 2018-07-03 NOTE — Progress Notes (Signed)
HPI:   [name] is a 72 y.o. male, who is here today for 6 months follow up.   He was last seen on 03/21/2018, when he was c/o fatigue.  Since his last visit he has followed with Dr. Jannifer Franklin due to episodes of tremor and memory problems.  Brain MRI 04/12/18:  1.    There is atrophy that is much more pronounced in the frontal and temporal insular region, left greater than right.  Although nonspecific, this can be seen in degenerative processes including frontotemporal dementia, Lewy body disease and less likely Alzheimer's disease. 2.    Moderate chronic microvascular ischemic changes. 3.    Two microhemorrhages in the subcortical deep white matter.  This is nonspecific and, with only 2 foci, is likely an incidental finding.  Early cerebral amyloid angiopathy cannot be ruled out. 4.    There are no acute findings.   Depression and anxiety, Dr. Jannifer Franklin resumed Lexapro 10 mg daily and instructed him to increase medication to 20 mg.  He discontinued Lexapro about a week ago after taking medication for 6 to 8 weeks.  Medication was causing drowsiness during the day and he was waking up a few times during the night. States that he did not notice any difference in anxiety or depression while he was on Lexapro. + Fatigue.  He denies suicidal thoughts. Denies new stressors. He is still complaining about tremor and insomnia, both her thinks are related Plavix. [name] He is having trouble falling asleep. He goes to sleep around 11:30-12 midnight and get up around 10:30 Am. He has trouble falling asleep, finally falls asleep around 3-4 Am.  Since he discontinued Plavix tremor improved about 50% but still having it intermittently during the day. He was feeling "jettery" all the time. He has not identified any exacerbating or alleviating factors.   Diabetes Mellitus II:  Currently on her is on Lantus 30 U daily. He has not been complaint with dietary recommendations,gained some  wt. Checking BS's : FG 160's-180's, sometimes he checks postprandial glucose but does not remember numbers.  Hypoglycemia:Denies.  He is tolerating medications well. He denies abdominal pain, nausea, vomiting, polydipsia, polyuria, or polyphagia. Negative for numbness, tingling, or burning.   Lab Results  Component Value Date   HGBA1C 6.6 (A) 01/10/2018   Lab Results  Component Value Date   MICROALBUR 2.2 (H) 01/10/2018   Hypertension:  Currently on amlodipine 5 mg daily. He discontinued losartan because he thought this medication may be causing tremor. No side effects reported from amlodipine. Home BP's 140's/80's.  He has not noted unusual headache, visual changes, exertional chest pain, dyspnea,  focal weakness, or edema.   Lab Results  Component Value Date   CREATININE 1.38 03/21/2018   BUN 20 03/21/2018   NA 136 03/21/2018   K 4.2 03/21/2018   CL 101 03/21/2018   CO2 28 03/21/2018    Review of Systems  Constitutional: Positive for fatigue. Negative for appetite change and fever.  HENT: Negative for nosebleeds, sore throat and trouble swallowing.   Eyes: Negative for redness and visual disturbance.  Respiratory: Negative for cough, shortness of breath and wheezing.   Cardiovascular: Negative for chest pain, palpitations and leg swelling.  Gastrointestinal: Negative for abdominal pain, nausea and vomiting.  Endocrine: Negative for polydipsia, polyphagia and polyuria.  Genitourinary: Negative for decreased urine volume and hematuria.  Skin: Negative for rash and wound.  Neurological: Positive for tremors. Negative for weakness, numbness and headaches.  Psychiatric/Behavioral: Positive for sleep disturbance. Negative for confusion. The patient is nervous/anxious.      Current Outpatient Medications on File Prior to Visit  Medication Sig Dispense Refill  . amLODipine (NORVASC) 10 MG tablet Take 0.5 tablets (5 mg total) by mouth daily. 90 tablet 3  . aspirin 81 MG  tablet Take 81 mg by mouth daily.     [name] glucose blood (ONE TOUCH ULTRA TEST) test strip USE TO TEST BLOOD SUGARS  DAILY. 100 each 5  . Insulin Pen Needle (ULTICARE MINI PEN NEEDLES) 31G X 6 MM MISC USE AS DIRECTED 100 each 3  . ondansetron (ZOFRAN) 4 MG tablet Take 1 tablet (4 mg total) by mouth every 8 (eight) hours as needed for nausea or vomiting. 20 tablet 0  . pantoprazole (PROTONIX) 40 MG tablet TAKE 1 TABLET BY MOUTH  DAILY 90 tablet 1  . rosuvastatin (CRESTOR) 10 MG tablet Take 1 tablet (10 mg total) by mouth 4 (four) times a week. 64 tablet 3  . losartan (COZAAR) 50 MG tablet TAKE 1 TABLET BY MOUTH TWO  TIMES DAILY (Patient not taking: Reported on 07/03/2018) 180 tablet 1  . nitroGLYCERIN (NITROSTAT) 0.4 MG SL tablet Place 1 tablet (0.4 mg total) under the tongue every 5 (five) minutes as needed for chest pain. (Patient not taking: Reported on 04/03/2018) 90 tablet 3   No current facility-administered medications on file prior to visit.      Past Medical History:  Diagnosis Date  . Arthritis    "back" (04/05/2014)  . Atrial fibrillation ([place])   . Blood in stool   . Bradycardia- with decrease of BB some improvement 09/10/2017  . CAD (coronary artery disease)   . 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 Dr Darcey Nora - to LAD, SVG to diagonal, SVG to OM, SVG to PDA ;  b.  [facility]HC (9/15): ostial LAD occl, ostial CFX 60%, prox AVCFX 95%, RCA 95%, S-PDA occl, S-OM/Dx with Dx limb occl and prox 60%, L-LAD patent, EF 35-40% wtih ant-lat and apical HK-AK >> PCI:  BMS to native RCA   . Diverticulosis   . Dizziness   . DM type 2 causing complication ([place]) 07/17/1094  . GERD (gastroesophageal reflux disease)   . Hypertension   . Iliac artery aneurysm ([place])   . Ischemic cardiomyopathy    a. EF 35-40% by LV gram 03/2014  . Joint pain   . Kidney stones   . Kidney stones   . Lower back pain Jan 2015  . Mixed hyperlipidemia 08/16/2013  . Myocardial infarction ([place]) 03/2014 X 2?  . S/P angioplasty with stent DES  mid SVG to OM and DES to prox. SVG to OM, DAPT for lifetime 09/10/2017  . Type II diabetes mellitus ([facility])    Allergies  Allergen Reactions  . Codeine Nausea Only and Other (See Comments)    Cannot take on empty stomach.  . Valium Nausea Only and Other (See Comments)    Cannot take on empty stomach.    Social History   Socioeconomic History  . Marital status: Married    Spouse name: Not on file  . Number of children: 3  . Years of education: Not on file  . Highest education level: Not on file  Occupational History  . Not on file  Social Needs  . Financial resource strain: Not on file  . Food insecurity:    Worry: Not on file    Inability: Not on file  . Transportation needs:  Medical: Not on file    Non-medical: Not on file  Tobacco Use  . Smoking status: Former Smoker    Years: 2.00    Types: Cigars    Last attempt to quit: 09/09/2017    Years since quitting: 0.8  . Smokeless tobacco: Never Used  . Tobacco comment: states he smoked cigar   Substance and Sexual Activity  . Alcohol use: No  . Drug use: No  . Sexual activity: Yes  Lifestyle  . Physical activity:    Days per week: Not on file    Minutes per session: Not on file  . Stress: Not on file  Relationships  . Social connections:    Talks on phone: Not on file    Gets together: Not on file    Attends religious service: Not on file    Active member of club or organization: Not on file    Attends meetings of clubs or organizations: Not on file    Relationship status: Not on file  Other Topics Concern  . Not on file  Social History Narrative   Lives with wife   Caffeine use: Soda sometimes   1/2 cup coffee in the morning sometimes   Right handed     Vitals:   07/03/18 1529  BP: 130/80  Pulse: 74  Resp: 12  Temp: 98.3 F (36.8 C)  SpO2: 98%   Body mass index is 27.19 kg/m.    Physical Exam  Nursing note and vitals reviewed. Constitutional: He is oriented to person, place, and time. He  appears well-developed and well-nourished. No distress.  HENT:  Head: Normocephalic and atraumatic.  Mouth/Throat: Oropharynx is clear and moist and mucous membranes are normal.  Eyes: Pupils are equal, round, and reactive to light. Conjunctivae are normal.  Cardiovascular: Normal rate and regular rhythm.  No murmur heard. Pulses:      Dorsalis pedis pulses are 2+ on the right side and 2+ on the left side.  Respiratory: Effort normal and breath sounds normal. No respiratory distress.  GI: Soft. He exhibits no mass. There is no hepatomegaly. There is no abdominal tenderness.  Musculoskeletal:        General: No edema.  Lymphadenopathy:    He has no cervical adenopathy.  Neurological: He is alert and oriented to person, place, and time. He has normal strength. He displays tremor (Mild, R>L). No cranial nerve deficit. Gait normal.  Skin: Skin is warm. No rash noted. No erythema.  Psychiatric: He has a normal mood and affect. Cognition and memory are normal.  Well groomed, good eye contact.     ASSESSMENT AND PLAN:   [name] was seen today for 6 months follow-up.  Orders Placed This Encounter  Procedures  . Basic metabolic panel  . POC HgB A1c   Lab Results  Component Value Date   HGBA1C 7.5 (A) 07/03/2018   Lab Results  Component Value Date   CREATININE 1.18 07/03/2018   BUN 17 07/03/2018   NA 137 07/03/2018   K 4.0 07/03/2018   CL 102 07/03/2018   CO2 27 07/03/2018    Type 2 diabetes mellitus with diabetic neuropathy, unspecified ([place]) HgA1C is not at goal. Lantus increased from 30 U to 35 U. Regular exercise and healthy diet with avoidance of added sugar food intake is an important part of treatment and recommended. Annual eye exam, periodic dental and foot care recommended. F/U in 5-6 months   Hypertension Today BP adequately controlled,he is reporting  elevated BP's at home. No changes in Amlodipine. Propranolol 20 mg bid started. Some side effects  discussed. Continue monitoring BP. F/U in 2 months.  Anxiety disorder Propranolol may help. He has been on Sertraline and Xanax, recently on Lexapro.He has not tolerated meds well. Psychotherapy recommended. We will re-evaluate in 2 months. Instructed about warning signs.  Tremor of both hands ? Essential tremor. Propranolol recommended, 20 mg bid. F/U in 2 months,before if needed.   Tremor of both hands ? Essential tremor. Mild hand tremor appreciated during visit. He agrees with trying Propranolol 20 mg bid. Some side effects discussed. Keeps next appt with Dr Jannifer Franklin.  Insomnia, unspecified type Improved after discontinuing Plavix and Lexapro.  Other possible etiologies discussed, it could be related to anxiety and depression. For we will hold on pharmacologic treatment (Remeron or Doxepin or Trazodone). Good sleep hygiene recommended for now.         Ronalda Walpole G. [name], MD  [employer] [place] office.

## 2018-07-04 LAB — BASIC METABOLIC PANEL
BUN: 17 mg/dL (ref 6–23)
CO2: 27 mEq/L (ref 19–32)
Calcium: 9.4 mg/dL (ref 8.4–10.5)
Chloride: 102 mEq/L (ref 96–112)
Creatinine, Ser: 1.18 mg/dL (ref 0.40–1.50)
GFR: 64.41 mL/min (ref >60.00)
Glucose, Bld: 208 mg/dL — ABNORMAL HIGH (ref 70–99)
Potassium: 4 mEq/L (ref 3.5–5.1)
Sodium: 137 mEq/L (ref 135–145)

## 2018-07-14 ENCOUNTER — Ambulatory Visit: Payer: Medicare Other | Admitting: Family Medicine

## 2018-08-01 NOTE — Telephone Encounter (Signed)
A user error has taken place: encounter opened in error, closed for administrative reasons, medication ordered in error, not dispensed to this patient.

## 2018-08-03 ENCOUNTER — Ambulatory Visit: Payer: Medicare Other | Admitting: Neurology

## 2018-08-08 ENCOUNTER — Ambulatory Visit (INDEPENDENT_AMBULATORY_CARE_PROVIDER_SITE_OTHER): Payer: Medicare Other | Admitting: Family Medicine

## 2018-08-08 ENCOUNTER — Encounter: Payer: Self-pay | Admitting: Family Medicine

## 2018-08-08 VITALS — BP 124/62 | HR 78 | Temp 98.0°F | Resp 12 | Ht 69.0 in | Wt 189.5 lb

## 2018-08-08 DIAGNOSIS — Z794 Long term (current) use of insulin: Secondary | ICD-10-CM

## 2018-08-08 DIAGNOSIS — F419 Anxiety disorder, unspecified: Secondary | ICD-10-CM

## 2018-08-08 DIAGNOSIS — R5383 Other fatigue: Secondary | ICD-10-CM

## 2018-08-08 DIAGNOSIS — E1140 Type 2 diabetes mellitus with diabetic neuropathy, unspecified: Secondary | ICD-10-CM

## 2018-08-08 DIAGNOSIS — E049 Nontoxic goiter, unspecified: Secondary | ICD-10-CM

## 2018-08-08 DIAGNOSIS — F331 Major depressive disorder, recurrent, moderate: Secondary | ICD-10-CM

## 2018-08-08 DIAGNOSIS — F339 Major depressive disorder, recurrent, unspecified: Secondary | ICD-10-CM | POA: Insufficient documentation

## 2018-08-08 DIAGNOSIS — R251 Tremor, unspecified: Secondary | ICD-10-CM

## 2018-08-08 MED ORDER — CITALOPRAM HYDROBROMIDE 10 MG PO TABS: 10.0000 mg | ORAL_TABLET | Freq: Every day | ORAL | 1 refills | Status: DC

## 2018-08-08 MED ORDER — INSULIN GLARGINE 100 UNIT/ML SOLOSTAR PEN: 40.0000 [IU] | PEN_INJECTOR | Freq: Every day | SUBCUTANEOUS | 2 refills | Status: DC

## 2018-08-08 MED ORDER — PROPRANOLOL HCL 20 MG PO TABS: 20.0000 mg | ORAL_TABLET | Freq: Two times a day (BID) | ORAL | 0 refills | Status: DC

## 2018-08-08 NOTE — Assessment & Plan Note (Signed)
Because BS's > 200 we need to adjust treatment. Recommend considering either Victoza or Trulicity, we discussed benefits. He prefers to increase dose of Lantus, so increased dose from 35 units to 40 units. Continue monitoring BS. Appointment with nutritionist will be arranged. Follow-up in 2 months.

## 2018-08-08 NOTE — Assessment & Plan Note (Signed)
Tremor is not noted during examination today. We discussed possible etiologies, including anxiety and essential tremor. Because he has not noted benefit with propranolol, we will start weaning medication off. Follow-up in 2 months.

## 2018-08-08 NOTE — Progress Notes (Signed)
ACUTE VISIT   HPI:  Chief Complaint  Patient presents with  . Hyperglycemia    blood sugars running high for the last couple of months    [name] is a 73 y.o. male, who is here today he his wife complaining of elevated BS's. He was last seen here in the office on 07/03/2018 for follow-up.  Diabetes Mellitus II: Currently on Lantus 35 U. Checking BS's : 200's, max 270's. Hypoglycemia:Denies. He is tolerating medications well. He denies abdominal pain, nausea, vomiting, polydipsia, polyuria, or polyphagia.  He has gained some weight but he does not think he is eating too much.  Lab Results  Component Value Date   CREATININE 1.18 07/03/2018   BUN 17 07/03/2018   NA 137 07/03/2018   K 4.0 07/03/2018   CL 102 07/03/2018   CO2 27 07/03/2018    Lab Results  Component Value Date   HGBA1C 7.5 (A) 07/03/2018   -Last visit he has started propranolol 20 mg twice daily, trying to help with hand tremor. He states initially that problem has improved but has not resolved, exacerbated by certain manual activities like shaving. He tells me that he is not sure if propranolol is helping, he denies side effects. He has followed with neurologist and work-up has been otherwise negative.   -He is also complaining of fatigue, no motivation. His wife states that he is sleeping more than usual, in the past he was getting up at 7 AM, now he is not sleeping until 11 am or 12 noon. Some days he stays in his pajamas all day. He denies suicidal thoughts.  Lab Results  Component Value Date   TSH 4.510 (H) 12/20/2017    He has tried Lexapro and Prozac, he discontinued because he did not feel like it was helping. Depressed mood and anxiety started after stent placement.  His wife states that he has been "ill", he gets irritated and argumentative about small issues at home.    Depression screen [place] 2/9 08/08/2018 01/18/2018  Decreased Interest 3 0  Down, Depressed, Hopeless  3 -  PHQ - 2 Score 6 0  Altered sleeping 3 0  Tired, decreased energy 3 3  Change in appetite 0 0  Feeling bad or failure about yourself  2 0  Trouble concentrating - 0  Moving slowly or fidgety/restless 0 1  Suicidal thoughts 0 0  PHQ-9 Score 14 4  Difficult doing work/chores Somewhat difficult Somewhat difficult    GAD 7 : Generalized Anxiety Score 08/08/2018  Nervous, Anxious, on Edge 3  Control/stop worrying 0  Worry too much - different things 2  Trouble relaxing 0  Restless 0  Easily annoyed or irritable 3  Afraid - awful might happen 0  Total GAD 7 Score 8  Anxiety Difficulty Somewhat difficult     Review of Systems  Constitutional: Positive for activity change and fatigue. Negative for appetite change and fever.  HENT: Negative for nosebleeds, sore throat and trouble swallowing.   Respiratory: Negative for cough, shortness of breath and wheezing.   Cardiovascular: Negative for chest pain, palpitations and leg swelling.  Gastrointestinal: Negative for abdominal pain, nausea and vomiting.       No changes in bowel habits.  Endocrine: Negative for cold intolerance and heat intolerance.  Genitourinary: Negative for decreased urine volume and hematuria.  Neurological: Positive for tremors. Negative for weakness and headaches.  Hematological: Negative for adenopathy. Does not bruise/bleed easily.  Psychiatric/Behavioral: Negative for  confusion. The patient is nervous/anxious.     Current Outpatient Medications on File Prior to Visit  Medication Sig Dispense Refill  . amLODipine (NORVASC) 10 MG tablet Take 0.5 tablets (5 mg total) by mouth daily. 90 tablet 3  . aspirin 81 MG tablet Take 81 mg by mouth daily.     [name] glucose blood (ONE TOUCH ULTRA TEST) test strip USE TO TEST BLOOD SUGARS  DAILY. 100 each 5  . Insulin Pen Needle (ULTICARE MINI PEN NEEDLES) 31G X 6 MM MISC USE AS DIRECTED 100 each 3  . losartan (COZAAR) 50 MG tablet TAKE 1 TABLET BY MOUTH TWO  TIMES DAILY  180 tablet 1  . ondansetron (ZOFRAN) 4 MG tablet Take 1 tablet (4 mg total) by mouth every 8 (eight) hours as needed for nausea or vomiting. 20 tablet 0  . pantoprazole (PROTONIX) 40 MG tablet TAKE 1 TABLET BY MOUTH  DAILY 90 tablet 1  . rosuvastatin (CRESTOR) 10 MG tablet Take 1 tablet (10 mg total) by mouth 4 (four) times a week. 64 tablet 3  . nitroGLYCERIN (NITROSTAT) 0.4 MG SL tablet Place 1 tablet (0.4 mg total) under the tongue every 5 (five) minutes as needed for chest pain. (Patient not taking: Reported on 04/03/2018) 90 tablet 3   No current facility-administered medications on file prior to visit.      Past Medical History:  Diagnosis Date  . Anxiety   . Arthritis    "back" (04/05/2014)  . Atrial fibrillation ([place])   . Blood in stool   . Bradycardia- with decrease of BB some improvement 09/10/2017  . CAD (coronary artery disease)   . 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 Dr Darcey Nora - to LAD, SVG to diagonal, SVG to OM, SVG to PDA ;  b.  LHC (9/15): ostial LAD occl, ostial CFX 60%, prox AVCFX 95%, RCA 95%, S-PDA occl, S-OM/Dx with Dx limb occl and prox 60%, L-LAD patent, EF 35-40% wtih ant-lat and apical HK-AK >> PCI:  BMS to native RCA   . Diverticulosis   . Dizziness   . DM type 2 causing complication ([place]) 07/17/1094  . GERD (gastroesophageal reflux disease)   . Hypertension   . Iliac artery aneurysm ([place])   . Ischemic cardiomyopathy    a. EF 35-40% by LV gram 03/2014  . Joint pain   . Kidney stones   . Kidney stones   . Lower back pain Jan 2015  . Mixed hyperlipidemia 08/16/2013  . Myocardial infarction ([place]) 03/2014 X 2?  . S/P angioplasty with stent DES mid SVG to OM and DES to prox. SVG to OM, DAPT for lifetime 09/10/2017  . Type II diabetes mellitus (HCC)    Allergies  Allergen Reactions  . Codeine Nausea Only and Other (See Comments)    Cannot take on empty stomach.  . Valium Nausea Only and Other (See Comments)    Cannot take on empty stomach.    Social History    Socioeconomic History  . Marital status: Married    Spouse name: Not on file  . Number of children: 3  . Years of education: Not on file  . Highest education level: Not on file  Occupational History  . Not on file  Social Needs  . Financial resource strain: Not on file  . Food insecurity:    Worry: Not on file    Inability: Not on file  . Transportation needs:    Medical: Not on file  Non-medical: Not on file  Tobacco Use  . Smoking status: Former Smoker    Years: 2.00    Types: Cigars    Last attempt to quit: 09/09/2017    Years since quitting: 0.9  . Smokeless tobacco: Never Used  . Tobacco comment: states he smoked cigar   Substance and Sexual Activity  . Alcohol use: No  . Drug use: No  . Sexual activity: Yes  Lifestyle  . Physical activity:    Days per week: Not on file    Minutes per session: Not on file  . Stress: Not on file  Relationships  . Social connections:    Talks on phone: Not on file    Gets together: Not on file    Attends religious service: Not on file    Active member of club or organization: Not on file    Attends meetings of clubs or organizations: Not on file    Relationship status: Not on file  Other Topics Concern  . Not on file  Social History Narrative   Lives with wife   Caffeine use: Soda sometimes   1/2 cup coffee in the morning sometimes   Right handed     Vitals:   08/08/18 1556  BP: 124/62  Pulse: 78  Resp: 12  Temp: 98 F (36.7 C)  SpO2: 97%   Body mass index is 27.98 kg/m.   Physical Exam  Nursing note and vitals reviewed. Constitutional: He is oriented to person, place, and time. He appears well-developed. No distress.  HENT:  Head: Normocephalic and atraumatic.  Mouth/Throat: Oropharynx is clear and moist and mucous membranes are normal.  Eyes: Conjunctivae are normal.  Neck: Thyromegaly (? Left nodule.) present.  Cardiovascular: Normal rate and regular rhythm.  No murmur heard. PT present bilateral.   Respiratory: Effort normal and breath sounds normal. No respiratory distress.  GI: Soft. He exhibits no mass. There is no hepatomegaly. There is no abdominal tenderness.  Musculoskeletal:        General: No edema.  Lymphadenopathy:    He has no cervical adenopathy.  Neurological: He is alert and oriented to person, place, and time. He has normal strength. He displays no tremor. No cranial nerve deficit.  Skin: Skin is warm. No rash noted. No erythema.  Psychiatric: His mood appears anxious. He expresses no suicidal ideation.  Well groomed, good eye contact.    ASSESSMENT AND PLAN:   [name] was seen today for hyperglycemia.  Orders Placed This Encounter  Procedures  . US THYROID  . TSH  . Amb Referral to Nutrition and Diabetic E   Lab Results  Component Value Date   TSH 3.12 08/08/2018    Other fatigue We discussed possible etiologies, including: Anxiety, medications, depression, and endocrine disorder among some. Further recommendation will be given according to TSH results. Celexa may help. Regular physical activity and a healthier diet may help.  Type 2 diabetes mellitus with diabetic neuropathy, unspecified (HCC) Because BS's > 200 we need to adjust treatment. Recommend considering either Victoza or Trulicity, we discussed benefits. He prefers to increase dose of Lantus, so increased dose from 35 units to 40 units. Continue monitoring BS. Appointment with nutritionist will be arranged. Follow-up in 2 months.  Anxiety disorder We discussed treatment options, recommend Celexa 10 mg daily. Strongly encouraged to take medication until his next visit. We discussed some side effects. Follow-up in 2 months, before if needed.  Major depressive disorder, recurrent (HCC) Celexa 10 mg started today.  Recommend considering psychotherapy, he is not interested. Instructed about warning signs. Follow-up in 2 months, before if needed.  Tremor of both hands Tremor is not  noted during examination today. We discussed possible etiologies, including anxiety and essential tremor. Because he has not noted benefit with propranolol, we will start weaning medication off. Follow-up in 2 months.   Enlarged thyroid gland ?  Left thyroid nodule. In the past he has had mildly abnormal TSH. Thyroid US will be arranged. Further recommendation will be given according to TSH and [place] results.  -     TSH -     US THYROID; Future    Return in about 2 months (around 10/07/2018) for DM,anxiety,tremor.     Skylah Delauter G. [name], MD  [facility]. [place] office.

## 2018-08-08 NOTE — Assessment & Plan Note (Signed)
Celexa 10 mg started today. Recommend considering psychotherapy, he is not interested. Instructed about warning signs. Follow-up in 2 months, before if needed.

## 2018-08-08 NOTE — Patient Instructions (Addendum)
A few things to remember from today's visit:   Anxiety disorder, unspecified type - Plan: citalopram (CELEXA) 10 MG tablet  Type 2 diabetes mellitus with diabetic neuropathy, with long-term current use of insulin ([place]) - Plan: Insulin Glargine (LANTUS SOLOSTAR) 100 UNIT/ML Solostar Pen, Amb Referral to Nutrition and Diabetic E  Tremor of both hands - Plan: propranolol (INDERAL) 20 MG tablet  Enlarged thyroid gland - Plan: TSH, US THYROID  Because propranolol does not seem to be helping, decreased to 1 tablet daily for a week then 1 tablet every other day for a week, and then every third day for a week, and then discontinue. Today Celexa 10 mg added. Monitor blood pressure at home. Continue monitoring blood sugars. Lantus increased to 40 units.  Please be sure medication list is accurate. If a new problem present, please set up appointment sooner than planned today.

## 2018-08-08 NOTE — Assessment & Plan Note (Signed)
We discussed treatment options, recommend Celexa 10 mg daily. Strongly encouraged to take medication until his next visit. We discussed some side effects. Follow-up in 2 months, before if needed.

## 2018-08-09 LAB — TSH: TSH: 3.12 u[IU]/mL (ref 0.35–4.50)

## 2018-08-10 ENCOUNTER — Telehealth: Payer: Self-pay | Admitting: *Deleted

## 2018-08-10 NOTE — Telephone Encounter (Signed)
Pharmacy requesting clarification of Lantus Solostar 5x3 ml . Please review and advise.

## 2018-08-14 ENCOUNTER — Telehealth: Payer: Self-pay | Admitting: *Deleted

## 2018-08-14 NOTE — Telephone Encounter (Signed)
I did send a prescription for Lantus to his pharmacy after his recent visit,08/08/18. If another prescription is needed it can be sent again. Thanks, BJ

## 2018-08-14 NOTE — Telephone Encounter (Signed)
Copied from [place]. Topic: General - Other >> Aug 14, 2018 11:22 AM Carolyn Stare wrote:  [facility] call and  requesting a call back concerning  Insulin Glargine (LANTUS SOLOSTAR) 100 UNIT/ML Solostar Pen  June with [facility] [phone]    ref number [id number]

## 2018-08-15 ENCOUNTER — Other Ambulatory Visit: Payer: Self-pay | Admitting: Family Medicine

## 2018-08-15 DIAGNOSIS — Z794 Long term (current) use of insulin: Principal | ICD-10-CM

## 2018-08-15 DIAGNOSIS — E1140 Type 2 diabetes mellitus with diabetic neuropathy, unspecified: Secondary | ICD-10-CM

## 2018-08-15 MED ORDER — INSULIN GLARGINE 100 UNIT/ML SOLOSTAR PEN: 40.0000 [IU] | PEN_INJECTOR | Freq: Every day | SUBCUTANEOUS | 2 refills | Status: DC

## 2018-08-15 NOTE — Telephone Encounter (Signed)
Reviewing my note, Lantus was increased from 35 units to 40 units daily. I did resend prescription.  Thanks, BJ

## 2018-08-15 NOTE — Telephone Encounter (Signed)
Spoke with [facility] there is some confusion on the directions and dosage. Per [facility] the script says 40 units in skin then also has inject 35 units once daily in the morning. They would like to know which one is correct. Please send an escript with correct dose or you can call back to [phone]  (reference# [id number]).

## 2018-08-15 NOTE — Telephone Encounter (Signed)
Message sent to Dr. Jordan for review. Please advise 

## 2018-08-17 ENCOUNTER — Other Ambulatory Visit: Payer: Medicare Other

## 2018-08-23 ENCOUNTER — Ambulatory Visit
Admission: RE | Admit: 2018-08-23 | Discharge: 2018-08-23 | Disposition: A | Discharge status: Home / Self Care | Payer: Medicare Other | Source: Ambulatory Visit | Attending: Family Medicine | Admitting: Family Medicine

## 2018-08-23 DIAGNOSIS — E010 Iodine-deficiency related diffuse (endemic) goiter: Secondary | ICD-10-CM | POA: Diagnosis not present

## 2018-08-23 DIAGNOSIS — E041 Nontoxic single thyroid nodule: Secondary | ICD-10-CM | POA: Diagnosis not present

## 2018-08-23 DIAGNOSIS — E049 Nontoxic goiter, unspecified: Secondary | ICD-10-CM

## 2018-08-28 ENCOUNTER — Other Ambulatory Visit: Payer: Self-pay | Admitting: Family Medicine

## 2018-08-28 DIAGNOSIS — E041 Nontoxic single thyroid nodule: Secondary | ICD-10-CM

## 2018-09-08 ENCOUNTER — Ambulatory Visit (INDEPENDENT_AMBULATORY_CARE_PROVIDER_SITE_OTHER): Payer: Medicare Other | Admitting: Endocrinology

## 2018-09-08 ENCOUNTER — Encounter: Payer: Self-pay | Admitting: Endocrinology

## 2018-09-08 ENCOUNTER — Other Ambulatory Visit: Payer: Self-pay

## 2018-09-08 DIAGNOSIS — E041 Nontoxic single thyroid nodule: Secondary | ICD-10-CM

## 2018-09-08 NOTE — Patient Instructions (Signed)
Let's check a biopsy, guided by the ultrasound.  you will receive a phone call, about a day and time for an appointment. If as expected, no cancer is found, Please come back for a follow-up appointment in 6 months.   The shakiness is not related to the thyroid, so you should approach it as something separate.

## 2018-09-08 NOTE — Progress Notes (Signed)
Subjective:    Patient ID: [name], male    DOB: [birth year], 73 y.o.   MRN: [id number]  HPI Pt is referred by Dr [name], for nodular thyroid.  1 month ago, pt was noted to have moderate swelling at the ant neck, but no assoc pain.  He is unaware of ever having had thyroid problems in the past.  He has no h/o XRT or surgery to the neck.   Past Medical History:  Diagnosis Date  . Anxiety   . Arthritis    "back" (04/05/2014)  . Atrial fibrillation ([place])   . Blood in stool   . Bradycardia- with decrease of BB some improvement 09/10/2017  . CAD (coronary artery disease)   . 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 Dr Darcey Nora - to LAD, SVG to diagonal, SVG to OM, SVG to PDA ;  b.  LHC (9/15): ostial LAD occl, ostial CFX 60%, prox AVCFX 95%, RCA 95%, S-PDA occl, S-OM/Dx with Dx limb occl and prox 60%, L-LAD patent, EF 35-40% wtih ant-lat and apical HK-AK >> PCI:  BMS to native RCA   . Diverticulosis   . Dizziness   . DM type 2 causing complication ([place]) 01/12/2594  . GERD (gastroesophageal reflux disease)   . Hypertension   . Iliac artery aneurysm ([place])   . Ischemic cardiomyopathy    a. EF 35-40% by LV gram 03/2014  . Joint pain   . Kidney stones   . Kidney stones   . Lower back pain Jan 2015  . Mixed hyperlipidemia 08/16/2013  . Myocardial infarction ([place]) 03/2014 X 2?  . S/P angioplasty with stent DES mid SVG to OM and DES to prox. SVG to OM, DAPT for lifetime 09/10/2017  . Type II diabetes mellitus ([place])     Past Surgical History:  Procedure Laterality Date  . CARDIAC CATHETERIZATION  2006   "before [facility]"  . CORONARY ANGIOPLASTY WITH STENT PLACEMENT  04/05/2014   "1"  . CORONARY ARTERY BYPASS GRAFT  2006   LIMA to LAD,SVG to diagonal,SVG to obtuse marginal,SVG to posterior descending  . CORONARY STENT INTERVENTION N/A 09/09/2017   Procedure: CORONARY STENT INTERVENTION;  Surgeon: Burnell Blanks, MD;  Location: [facility];  Service: Cardiovascular;  Laterality: N/A;  .  CORONARY STENT PLACEMENT  09/09/2017  . CYSTOSCOPY W/ STONE MANIPULATION  1970's X 2  . CYSTOSCOPY W/ URETEROSCOPY W/ LITHOTRIPSY  ~ 2009  . ILIAC ARTERY STENT Left Aug. 10, 2011   Aorta to right ext iliac and left CIA stent repair   . LEFT HEART CATH AND CORS/GRAFTS ANGIOGRAPHY N/A 09/08/2017   Procedure: LEFT HEART CATH AND CORS/GRAFTS ANGIOGRAPHY;  Surgeon: [name], MD;  Location: [facility];  Service: Cardiovascular;  Laterality: N/A;  . LEFT HEART CATHETERIZATION WITH CORONARY/GRAFT ANGIOGRAM N/A 04/05/2014   Procedure: LEFT HEART CATHETERIZATION WITH [name];  Surgeon: [name], MD;  Location: [facility];  Service: Cardiovascular;  Laterality: N/A;  . NM MYOCAR PERF WALL MOTION  11/18/2009   low risk  . TONSILLECTOMY AND ADENOIDECTOMY  1951    Social History   Socioeconomic History  . Marital status: Married    Spouse name: Not on file  . Number of children: 3  . Years of education: Not on file  . Highest education level: Not on file  Occupational History  . Not on file  Social Needs  . Financial resource strain: Not on file  . Food  insecurity:    Worry: Not on file    Inability: Not on file  . Transportation needs:    Medical: Not on file    Non-medical: Not on file  Tobacco Use  . Smoking status: Former Smoker    Years: 2.00    Types: Cigars    Last attempt to quit: 09/09/2017    Years since quitting: 1.0  . Smokeless tobacco: Never Used  . Tobacco comment: states he smoked cigar   Substance and Sexual Activity  . Alcohol use: No  . Drug use: No  . Sexual activity: Yes  Lifestyle  . Physical activity:    Days per week: Not on file    Minutes per session: Not on file  . Stress: Not on file  Relationships  . Social connections:    Talks on phone: Not on file    Gets together: Not on file    Attends religious service: Not on file    Active member of club or organization: Not on file    Attends meetings of clubs or  organizations: Not on file    Relationship status: Not on file  . Intimate partner violence:    Fear of current or ex partner: Not on file    Emotionally abused: Not on file    Physically abused: Not on file    Forced sexual activity: Not on file  Other Topics Concern  . Not on file  Social History Narrative   Lives with wife   Caffeine use: Soda sometimes   1/2 cup coffee in the morning sometimes   Right handed     Current Outpatient Medications on File Prior to Visit  Medication Sig Dispense Refill  . amLODipine (NORVASC) 10 MG tablet Take 0.5 tablets (5 mg total) by mouth daily. 90 tablet 3  . aspirin 81 MG tablet Take 81 mg by mouth daily.     . citalopram (CELEXA) 10 MG tablet Take 1 tablet (10 mg total) by mouth daily. 30 tablet 1  . glucose blood (ONE TOUCH ULTRA TEST) test strip USE TO TEST BLOOD SUGARS  DAILY. 100 each 5  . Insulin Glargine (LANTUS SOLOSTAR) 100 UNIT/ML Solostar Pen Inject 40 Units into the skin daily. 15 mL 2  . Insulin Pen Needle (ULTICARE MINI PEN NEEDLES) 31G X 6 MM MISC USE AS DIRECTED 100 each 3  . losartan (COZAAR) 50 MG tablet TAKE 1 TABLET BY MOUTH TWO  TIMES DAILY 180 tablet 1  . ondansetron (ZOFRAN) 4 MG tablet Take 1 tablet (4 mg total) by mouth every 8 (eight) hours as needed for nausea or vomiting. 20 tablet 0  . pantoprazole (PROTONIX) 40 MG tablet TAKE 1 TABLET BY MOUTH  DAILY 90 tablet 1  . rosuvastatin (CRESTOR) 10 MG tablet Take 1 tablet (10 mg total) by mouth 4 (four) times a week. 64 tablet 3  . nitroGLYCERIN (NITROSTAT) 0.4 MG SL tablet Place 1 tablet (0.4 mg total) under the tongue every 5 (five) minutes as needed for chest pain. (Patient not taking: Reported on 04/03/2018) 90 tablet 3  . propranolol (INDERAL) 20 MG tablet Take 1 tablet (20 mg total) by mouth 2 (two) times daily for 29 days. Wean off. 60 tablet 0   No current facility-administered medications on file prior to visit.     Allergies  Allergen Reactions  . Codeine Nausea  Only and Other (See Comments)    Cannot take on empty stomach.  . Valium Nausea Only and Other (See  Comments)    Cannot take on empty stomach.    Family History  Problem Relation Age of Onset  . Diabetes Mother   . Hypertension Mother   . Heart disease Mother        AAA  . Heart disease Sister   . Heart disease Son        Heart Disease before age 72  . Diabetes Brother   . Thyroid disease Neg Hx     BP (!) 152/72 (BP Location: Left Arm, Patient Position: Sitting, Cuff Size: Normal)   Pulse 69   Ht 5\' 9"  (1.753 m)   Wt 185 lb 12.8 oz (84.3 kg)   SpO2 96%   BMI 27.44 kg/m     Review of Systems Denies hoarseness, diplopia, chest pain, sob, cough, dysphagia, diarrhea, itching, flushing, easy bruising, headache, numbness, and rhinorrhea.  He has slight tremor, cold intolerance, and weight gain.  Depression is well-controlled     Objective:   Physical Exam VS: see vs page GEN: no distress HEAD: head: no deformity eyes: no periorbital swelling, no proptosis external nose and ears are normal mouth: no lesion seen NECK: supple, thyroid is not enlarged.  I cannot appreciate the nodule CHEST WALL: no deformity LUNGS: clear to auscultation CV: reg rate and rhythm, no murmur ABD: abdomen is soft, nontender.  no hepatosplenomegaly.  not distended.  no hernia MUSCULOSKELETAL: muscle bulk and strength are grossly normal.  no obvious joint swelling.  gait is normal and steady EXTEMITIES: no deformity.  no edema PULSES: no carotid bruit NEURO:  cn 2-12 grossly intact.   readily moves all 4's.  sensation is intact to touch on all 4's SKIN:  Normal texture and temperature.  No rash or suspicious lesion is visible.  Not diaphoretic.   NODES:  None palpable at the neck PSYCH: alert, well-oriented.  Does not appear anxious nor depressed.'  I have reviewed outside records, and summarized: Pt was noted to have thyroid nodule, and referred here.  Main probs addressed were DM and fatigue.     Lab Results  Component Value Date   TSH 3.12 08/08/2018   [place]: 3.4 cm nodule/pseudonodule nearly replacing the entirety of the right lobe of the thyroid.     Assessment & Plan:  Thyroid nodule, new to me.  I can't palpate, so she'll need to have the bx done under [place].  Patient Instructions  Let's check a biopsy, guided by the ultrasound.  you will receive a phone call, about a day and time for an appointment. If as expected, no cancer is found, Please come back for a follow-up appointment in 6 months.   The shakiness is not related to the thyroid, so you should approach it as something separate.

## 2018-10-02 ENCOUNTER — Ambulatory Visit: Payer: Medicare Other | Admitting: Family Medicine

## 2018-10-06 ENCOUNTER — Ambulatory Visit: Payer: Medicare Other | Admitting: Family Medicine

## 2018-10-12 ENCOUNTER — Telehealth: Payer: Self-pay

## 2018-10-12 NOTE — Telephone Encounter (Signed)
Spoke with pt about appt. Asked about using the Webex. Pt states that they would like to reschedule

## 2018-10-16 ENCOUNTER — Other Ambulatory Visit: Payer: Self-pay | Admitting: Family Medicine

## 2018-10-16 DIAGNOSIS — E1140 Type 2 diabetes mellitus with diabetic neuropathy, unspecified: Secondary | ICD-10-CM

## 2018-10-16 DIAGNOSIS — Z794 Long term (current) use of insulin: Principal | ICD-10-CM

## 2018-10-17 ENCOUNTER — Ambulatory Visit: Payer: Medicare Other | Admitting: Cardiovascular Disease

## 2018-11-29 ENCOUNTER — Telehealth: Payer: Self-pay | Admitting: Cardiovascular Disease

## 2018-11-29 DIAGNOSIS — I5032 Chronic diastolic (congestive) heart failure: Secondary | ICD-10-CM

## 2018-11-29 DIAGNOSIS — I25810 Atherosclerosis of coronary artery bypass graft(s) without angina pectoris: Secondary | ICD-10-CM

## 2018-11-29 DIAGNOSIS — Z794 Long term (current) use of insulin: Secondary | ICD-10-CM

## 2018-11-29 DIAGNOSIS — E118 Type 2 diabetes mellitus with unspecified complications: Secondary | ICD-10-CM

## 2018-11-29 DIAGNOSIS — E7800 Pure hypercholesterolemia, unspecified: Secondary | ICD-10-CM

## 2018-11-29 NOTE — Telephone Encounter (Signed)
It would be great if Shadow could have his lipid profile, A1c (diabetes type 2) and CMET same day. Thanks again [employer]

## 2018-11-29 NOTE — Telephone Encounter (Signed)
I spoke with the patient's wife. She is aware Dr. Sallyanne Kuster has given the ok for the patient to have his lipid panel/ HgbA1C & CMET done on 6/22 at the lab at the [facility] office when she is there for her testing.  I have advised her to have the patient come fasting and bring a snack for after his labs.  She voices understanding and is agreeable.

## 2018-11-29 NOTE — Telephone Encounter (Signed)
I spoke with the patient's wife today about a medication change for herself.  She is scheduled for an echo/ MV at [facility] on 01/01/19. She was asking if the patient needed to have his lipid panel drawn prior to his f/u with Dr. Sallyanne Kuster on 7/1.  His last lipid panel was 2/19.  I advised I would review with Dr. Sallyanne Kuster to see if he wanted to have the patient go ahead and repeat his lipid panel (and any other labs) at the same time his wife goes to [facility] on 6/22 for her testing.

## 2018-12-14 ENCOUNTER — Other Ambulatory Visit: Payer: Self-pay | Admitting: Family Medicine

## 2018-12-14 DIAGNOSIS — R251 Tremor, unspecified: Secondary | ICD-10-CM

## 2018-12-25 ENCOUNTER — Other Ambulatory Visit: Payer: Self-pay

## 2018-12-25 ENCOUNTER — Other Ambulatory Visit: Payer: Medicare Other | Admitting: *Deleted

## 2018-12-25 DIAGNOSIS — I25810 Atherosclerosis of coronary artery bypass graft(s) without angina pectoris: Secondary | ICD-10-CM | POA: Diagnosis not present

## 2018-12-25 DIAGNOSIS — Z794 Long term (current) use of insulin: Secondary | ICD-10-CM

## 2018-12-25 DIAGNOSIS — E118 Type 2 diabetes mellitus with unspecified complications: Secondary | ICD-10-CM | POA: Diagnosis not present

## 2018-12-25 DIAGNOSIS — I5032 Chronic diastolic (congestive) heart failure: Secondary | ICD-10-CM | POA: Diagnosis not present

## 2018-12-25 DIAGNOSIS — E7800 Pure hypercholesterolemia, unspecified: Secondary | ICD-10-CM | POA: Diagnosis not present

## 2018-12-26 ENCOUNTER — Telehealth: Payer: Self-pay | Admitting: *Deleted

## 2018-12-26 DIAGNOSIS — E118 Type 2 diabetes mellitus with unspecified complications: Secondary | ICD-10-CM

## 2018-12-26 DIAGNOSIS — I10 Essential (primary) hypertension: Secondary | ICD-10-CM

## 2018-12-26 DIAGNOSIS — Z794 Long term (current) use of insulin: Secondary | ICD-10-CM

## 2018-12-26 LAB — LIPID PANEL
Chol/HDL Ratio: 5.5 ratio — ABNORMAL HIGH (ref 0.0–5.0)
Cholesterol, Total: 149 mg/dL (ref 100–199)
HDL: 27 mg/dL — ABNORMAL LOW (ref >39)
LDL Calculated: 68 mg/dL (ref 0–99)
Triglycerides: 269 mg/dL — ABNORMAL HIGH (ref 0–149)
VLDL Cholesterol Cal: 54 mg/dL — ABNORMAL HIGH (ref 5–40)

## 2018-12-26 LAB — COMPREHENSIVE METABOLIC PANEL
ALT: 19 IU/L (ref 0–44)
AST: 48 IU/L — ABNORMAL HIGH (ref 0–40)
Albumin/Globulin Ratio: 1.8 (ref 1.2–2.2)
Albumin: 4.5 g/dL (ref 3.7–4.7)
Alkaline Phosphatase: 97 IU/L (ref 39–117)
BUN/Creatinine Ratio: 12 (ref 10–24)
BUN: 17 mg/dL (ref 8–27)
Bilirubin Total: 0.8 mg/dL (ref 0.0–1.2)
CO2: 20 mmol/L (ref 20–29)
Calcium: 9.4 mg/dL (ref 8.6–10.2)
Chloride: 98 mmol/L (ref 96–106)
Creatinine, Ser: 1.39 mg/dL — ABNORMAL HIGH (ref 0.76–1.27)
GFR calc Af Amer: 58 mL/min/{1.73_m2} — ABNORMAL LOW (ref >59)
GFR calc non Af Amer: 50 mL/min/{1.73_m2} — ABNORMAL LOW (ref >59)
Globulin, Total: 2.5 g/dL (ref 1.5–4.5)
Glucose: 208 mg/dL — ABNORMAL HIGH (ref 65–99)
Potassium: 4.1 mmol/L (ref 3.5–5.2)
Sodium: 136 mmol/L (ref 134–144)
Total Protein: 7 g/dL (ref 6.0–8.5)

## 2018-12-26 LAB — HEMOGLOBIN A1C
Est. average glucose Bld gHb Est-mCnc: 200 mg/dL
Hgb A1c MFr Bld: 8.6 % — ABNORMAL HIGH (ref 4.8–5.6)

## 2018-12-26 NOTE — Telephone Encounter (Signed)
-----   Message from Sanda Klein, MD sent at 12/26/2018  8:45 AM EDT ----- Diabetes control has deteriorated substantially over last year. Triglycerides mildly elevated (should get better with better glucose control), cholesterol is good. Kidney function is slightly worse (may be "dry" due to glucosuria?). Recheck BMET and A1c in 3-6 months, avoid NSAIDs. Discuss DM management with Dr. [name].

## 2018-12-26 NOTE — Telephone Encounter (Signed)
Patient made aware of results and verbalized understanding.  Repeat lab orders have been placed. We did go over foods to avoid to help with his glucose control.

## 2018-12-29 ENCOUNTER — Ambulatory Visit (INDEPENDENT_AMBULATORY_CARE_PROVIDER_SITE_OTHER): Payer: Medicare Other | Admitting: Family Medicine

## 2018-12-29 ENCOUNTER — Other Ambulatory Visit: Payer: Self-pay

## 2018-12-29 ENCOUNTER — Encounter: Payer: Self-pay | Admitting: Family Medicine

## 2018-12-29 VITALS — BP 162/100 | HR 68 | Temp 97.6°F | Resp 12 | Ht 69.0 in | Wt 181.6 lb

## 2018-12-29 DIAGNOSIS — E1140 Type 2 diabetes mellitus with diabetic neuropathy, unspecified: Secondary | ICD-10-CM

## 2018-12-29 DIAGNOSIS — R110 Nausea: Secondary | ICD-10-CM | POA: Diagnosis not present

## 2018-12-29 DIAGNOSIS — I10 Essential (primary) hypertension: Secondary | ICD-10-CM

## 2018-12-29 DIAGNOSIS — Z794 Long term (current) use of insulin: Secondary | ICD-10-CM

## 2018-12-29 DIAGNOSIS — R634 Abnormal weight loss: Secondary | ICD-10-CM

## 2018-12-29 DIAGNOSIS — F413 Other mixed anxiety disorders: Secondary | ICD-10-CM | POA: Diagnosis not present

## 2018-12-29 MED ORDER — EMPAGLIFLOZIN 10 MG PO TABS: 10.0000 mg | ORAL_TABLET | Freq: Every day | ORAL | 4 refills | Status: DC

## 2018-12-29 MED ORDER — CITALOPRAM HYDROBROMIDE 20 MG PO TABS: 20.0000 mg | ORAL_TABLET | Freq: Every day | ORAL | 1 refills | Status: DC

## 2018-12-29 NOTE — Assessment & Plan Note (Signed)
He would like to try Celexa again, this time he is going to try 20 mg instead of 10 mg. We discussed some side effects. Follow-up in 2 months, before if needed.

## 2018-12-29 NOTE — Assessment & Plan Note (Signed)
This is a chronic problem. Because she is now reporting abnormal weight loss, I think GI evaluation is appropriate. Avoid foods he has already identified that exacerbate problem. Instructed about warning signs.

## 2018-12-29 NOTE — Progress Notes (Signed)
HPI:   [name] is a 73 y.o. male, who is here today for chronic disease management. Last f/u visit 08/08/18. Since his last visit he has followed with cardiologist.  Lab Results  Component Value Date   HGBA1C 8.6 (H) 12/25/2018   He thinks his BS should be better because he does "not eat much."  BS 200's and occasional 300's. Snacking several times per day,for breakfast he has "little" bread with no sugar and a glass of juice.  He snacks on crackers during the day, saltines or cheese crackers. Denies polydipsia,polyuria, or polyphagia.  HTN, BP elevated today. He checks BP at home occasionally, "it is high and low sometimes."  Currently he is on amlodipine 10 mg daily and propranolol 20 mg twice daily. He denies unusual headaches, visual changes, chest pain, dyspnea, focal deficit, or edema.  Lab Results  Component Value Date   CREATININE 1.39 (H) 12/25/2018   BUN 17 12/25/2018   NA 136 12/25/2018   K 4.1 12/25/2018   CL 98 12/25/2018   CO2 20 12/25/2018    He is still having "shaking" episodes, he has had for several months. He feels "nervous", states that he has improved but is still has intermittent episodes. He has not identified exacerbating or alleviating factors. He has been evaluated by neurologist.  He thinks problem started after taking Plavix, he discontinued medication but problem did not resolve. He has tried Lexapro, Prozac, and last visit I gave him Celexa. He discontinued Celexa 10 mg because he did not feel like he was helping.  + Irritable. Sometimes depressed mood, denies suicidal thoughts.    He is also concerned about weight loss. He is reporting "no appetite",eats "very little", certain food intake causes nausea.  He has had nausea intermittently for several months, Zofran 4 mg helps. History of GERD, currently he is on Protonix 40 mg daily. Denies dysphagia, heartburn, abdominal pain, vomiting, melena, or blood in the stool.  Having some constipation x2 to 3 weeks, is taking OTC MiraLAX, which has helped. He feels like he still needs to go after defecation. + Straining. Last bowel movement was yesterday.   Review of Systems  Constitutional: Positive for fatigue. Negative for activity change, chills and fever.  HENT: Negative for nosebleeds and sore throat.   Respiratory: Negative for cough and wheezing.   Genitourinary: Negative for decreased urine volume, dysuria and hematuria.  Neurological: Negative for facial asymmetry and speech difficulty.  Psychiatric/Behavioral: Negative for confusion. The patient is nervous/anxious.   Rest see pertinent positives and negatives per HPI.   Current Outpatient Medications on File Prior to Visit  Medication Sig Dispense Refill  . amLODipine (NORVASC) 10 MG tablet Take 0.5 tablets (5 mg total) by mouth daily. 90 tablet 3  . aspirin 81 MG tablet Take 81 mg by mouth daily.     [name] glucose blood (ONE TOUCH ULTRA TEST) test strip USE TO TEST BLOOD SUGARS  DAILY. 100 each 5  . Insulin Pen Needle (ULTICARE MINI PEN NEEDLES) 31G X 6 MM MISC USE AS DIRECTED 100 each 3  . LANTUS SOLOSTAR 100 UNIT/ML Solostar Pen INJECT SUBCUTANEOUSLY 40  UNITS DAILY 45 mL 3  . losartan (COZAAR) 50 MG tablet TAKE 1 TABLET BY MOUTH TWO  TIMES DAILY 180 tablet 1  . nitroGLYCERIN (NITROSTAT) 0.4 MG SL tablet Place 1 tablet (0.4 mg total) under the tongue every 5 (five) minutes as needed for chest pain. (Patient not taking: Reported on 04/03/2018)  90 tablet 3  . ondansetron (ZOFRAN) 4 MG tablet Take 1 tablet (4 mg total) by mouth every 8 (eight) hours as needed for nausea or vomiting. 20 tablet 0  . pantoprazole (PROTONIX) 40 MG tablet TAKE 1 TABLET BY MOUTH  DAILY 90 tablet 1  . propranolol (INDERAL) 20 MG tablet TAKE 1 TABLET BY MOUTH TWO  TIMES DAILY 120 tablet 1  . rosuvastatin (CRESTOR) 10 MG tablet Take 1 tablet (10 mg total) by mouth 4 (four) times a week. 64 tablet 3   No current  facility-administered medications on file prior to visit.      Past Medical History:  Diagnosis Date  . Anxiety   . Arthritis    "back" (04/05/2014)  . Atrial fibrillation ([place])   . Blood in stool   . Bradycardia- with decrease of BB some improvement 09/10/2017  . CAD (coronary artery disease)   . 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 Dr Darcey Nora - to LAD, SVG to diagonal, SVG to OM, SVG to PDA ;  b.  [facility] (9/15): ostial LAD occl, ostial CFX 60%, prox AVCFX 95%, RCA 95%, S-PDA occl, S-OM/Dx with Dx limb occl and prox 60%, L-LAD patent, EF 35-40% wtih ant-lat and apical HK-AK >> PCI:  BMS to native RCA   . Diverticulosis   . Dizziness   . DM type 2 causing complication ([place]) 02/09/174  . GERD (gastroesophageal reflux disease)   . Hypertension   . Iliac artery aneurysm ([place])   . Ischemic cardiomyopathy    a. EF 35-40% by LV gram 03/2014  . Joint pain   . Kidney stones   . Kidney stones   . Lower back pain Jan 2015  . Mixed hyperlipidemia 08/16/2013  . Myocardial infarction ([place]) 03/2014 X 2?  . S/P angioplasty with stent DES mid SVG to OM and DES to prox. SVG to OM, DAPT for lifetime 09/10/2017  . Type II diabetes mellitus ([facility])    Allergies  Allergen Reactions  . Codeine Nausea Only and Other (See Comments)    Cannot take on empty stomach.  . Valium Nausea Only and Other (See Comments)    Cannot take on empty stomach.    Social History   Socioeconomic History  . Marital status: Married    Spouse name: Not on file  . Number of children: 3  . Years of education: Not on file  . Highest education level: Not on file  Occupational History  . Not on file  Social Needs  . Financial resource strain: Not on file  . Food insecurity    Worry: Not on file    Inability: Not on file  . Transportation needs    Medical: Not on file    Non-medical: Not on file  Tobacco Use  . Smoking status: Former Smoker    Years: 2.00    Types: Cigars    Quit date: 09/09/2017    Years since quitting: 1.3   . Smokeless tobacco: Never Used  . Tobacco comment: states he smoked cigar   Substance and Sexual Activity  . Alcohol use: No  . Drug use: No  . Sexual activity: Yes  Lifestyle  . Physical activity    Days per week: Not on file    Minutes per session: Not on file  . Stress: Not on file  Relationships  . Social Herbalist on phone: Not on file    Gets together: Not on file    Attends  religious service: Not on file    Active member of club or organization: Not on file    Attends meetings of clubs or organizations: Not on file    Relationship status: Not on file  Other Topics Concern  . Not on file  Social History Narrative   Lives with wife   Caffeine use: Soda sometimes   1/2 cup coffee in the morning sometimes   Right handed     Vitals:   12/29/18 0849  BP: (!) 162/100  Pulse: 68  Resp: 12  Temp: 97.6 F (36.4 C)  SpO2: 95%   Body mass index is 26.82 kg/m.   Physical Exam  Nursing note reviewed. Constitutional: He is oriented to person, place, and time. He appears well-developed. No distress.  HENT:  Head: Normocephalic and atraumatic.  Mouth/Throat: Oropharynx is clear and moist and mucous membranes are normal.  Eyes: Pupils are equal, round, and reactive to light. Conjunctivae are normal.  Cardiovascular: Normal rate and regular rhythm.  No murmur heard. Pulses:      Dorsalis pedis pulses are 2+ on the right side and 2+ on the left side.  Respiratory: Effort normal and breath sounds normal. No respiratory distress.  GI: Soft. He exhibits no mass. There is no hepatomegaly. There is no abdominal tenderness.  Musculoskeletal:        General: No edema.  Lymphadenopathy:    He has no cervical adenopathy.  Neurological: He is alert and oriented to person, place, and time. He has normal strength. No cranial nerve deficit. Gait normal.  Skin: Skin is warm. No rash noted. No erythema.  Psychiatric: His mood appears anxious. Cognition and memory are  normal.  Well groomed, good eye contact.    ASSESSMENT AND PLAN:  Mr. Steele was seen today for follow-up.  Diagnoses and all orders for this visit: Orders Placed This Encounter  Procedures  . Ambulatory referral to Gastroenterology     Weight loss We discussed possible etiologies, including poorly controlled DM. Daily weighing in the morning. GI referral placed,persistent nausea. He does not remember date of his last colonoscopy. F/U in 2 months.  -     Ambulatory referral to Gastroenterology   Type 2 diabetes mellitus with diabetic neuropathy, unspecified ([place]) HgA1C is not at goal, steadily getting worse. Continue Lantus 45 units daily. Jardiance 10 mg daily added today. Caution with hypoglycemic events. Continue monitoring BS. Annual eye exam, more frequently if needed, and appropriate foot care recommended. Follow-up in 2 months, before if needed.  Hypertension BP elevated today. Possible complications of elevated BPs discussed. Reviewed BP's during visits with other providers and in normal range. For now no changes in current management. Instructed to monitor BP and to bring BP readings to next visit. He was clearly instructed about warning signs.  Anxiety disorder He would like to try Celexa again, this time he is going to try 20 mg instead of 10 mg. We discussed some side effects. Follow-up in 2 months, before if needed.  Nausea without vomiting This is a chronic problem. Because she is now reporting abnormal weight loss, I think GI evaluation is appropriate. Avoid foods he has already identified that exacerbate problem. Instructed about warning signs.    Return in about 2 months (around 02/28/2019) for HTN,anxiety,DM.    -Mr. [name] was advised to return sooner than planned today if new concerns arise.    Annabell Oconnor G. [name], MD  [facility]. [place] office.

## 2018-12-29 NOTE — Assessment & Plan Note (Addendum)
HgA1C is not at goal, steadily getting worse. Continue Lantus 45 units daily. Jardiance 10 mg daily added today. Caution with hypoglycemic events. Continue monitoring BS. Annual eye exam, more frequently if needed, and appropriate foot care recommended. Follow-up in 2 months, before if needed.

## 2018-12-29 NOTE — Patient Instructions (Addendum)
A few things to remember from today's visit:   Type 2 diabetes mellitus with diabetic neuropathy, with long-term current use of insulin ([place]) - Plan:   Essential hypertension - Plan:   Other mixed anxiety disorders - Plan:   Jardiace added. Celexa 20 mg to resume.  Pending thyroid biopsy. GI referral due to nausea. Please be sure medication list is accurate. If a new problem present, please set up appointment sooner than planned today.

## 2018-12-29 NOTE — Assessment & Plan Note (Signed)
BP elevated today. Possible complications of elevated BPs discussed. Reviewed BP's during visits with other providers and in normal range. For now no changes in current management. Instructed to monitor BP and to bring BP readings to next visit. He was clearly instructed about warning signs.

## 2019-01-01 ENCOUNTER — Other Ambulatory Visit: Payer: Medicare Other

## 2019-01-02 ENCOUNTER — Telehealth: Payer: Self-pay | Admitting: *Deleted

## 2019-01-02 NOTE — Telephone Encounter (Signed)
Spoke with patient and he stated that the Jardiance did help his blood sugar go down, however he had an episode on yesterday where he felt like he got really sweaty and felt like he was going to pass out. Patient has stopped taking medication and wanted to know if he should resume medication. Patient has contacted Dr. Loanne Drilling concerning the ultrasound because he was the one that ordered it.

## 2019-01-02 NOTE — Telephone Encounter (Signed)
Copied from [place] [id number]. Topic: General - Other >> Jan 01, 2019  8:53 AM Alanda Slim E wrote: Reason for CRM: Pt called to give Dr. [name] information. Pt states that the provider Dr. [name] sent the Pt to back in February was Dr. Renato Shin at [facility]. Pt was never called to schedule the ultrasound or Biopsy from Dr. Loanne Drilling Possibly due to start of Covid.

## 2019-01-02 NOTE — Telephone Encounter (Signed)
The patient has been made aware to bring a mask to his appointment on 01/10/2019.    COVID-19 Pre-Screening Questions:  . In the past 7 to 10 days have you had a cough,  shortness of breath, headache, congestion, fever (100 or greater) body aches, chills, sore throat, or sudden loss of taste or sense of smell? No . Have you been around anyone with known Covid 19. No . Have you been around anyone who is awaiting Covid 19 test results in the past 7 to 10 days? No . Have you been around anyone who has been exposed to Covid 19, or has mentioned symptoms of Covid 19 within the past 7 to 10 days? No  If you have any concerns/questions about symptoms patients report during screening (either on the phone or at threshold). Contact the provider seeing the patient or DOD for further guidance.  If neither are available contact a member of the leadership team.

## 2019-01-03 ENCOUNTER — Other Ambulatory Visit: Payer: Self-pay

## 2019-01-03 ENCOUNTER — Encounter: Payer: Self-pay | Admitting: Family Medicine

## 2019-01-03 ENCOUNTER — Ambulatory Visit (INDEPENDENT_AMBULATORY_CARE_PROVIDER_SITE_OTHER): Payer: Medicare Other | Admitting: Family Medicine

## 2019-01-03 VITALS — BP 142/85 | HR 70

## 2019-01-03 DIAGNOSIS — Z794 Long term (current) use of insulin: Secondary | ICD-10-CM | POA: Diagnosis not present

## 2019-01-03 DIAGNOSIS — I10 Essential (primary) hypertension: Secondary | ICD-10-CM | POA: Diagnosis not present

## 2019-01-03 DIAGNOSIS — E041 Nontoxic single thyroid nodule: Secondary | ICD-10-CM

## 2019-01-03 DIAGNOSIS — E1140 Type 2 diabetes mellitus with diabetic neuropathy, unspecified: Secondary | ICD-10-CM

## 2019-01-03 NOTE — Telephone Encounter (Signed)
Spoke with pt today. We are looking into appt he was supposed to have with radiologist for thyroid nodule biopsy.  See telephone visit. Ifrah Vest [name], MD

## 2019-01-03 NOTE — Progress Notes (Signed)
Virtual Visit via Telephone Note  I connected with [name] on 01/07/19 at 10:30 AM EDT by telephone and verified that I am speaking with the correct person using two identifiers.   I discussed the limitations, risks, security and privacy concerns of performing an evaluation and management service by telephone and the availability of in person appointments. I also discussed with the patient that there may be a patient responsible charge related to this service. The patient expressed understanding and agreed to proceed.  Location patient: home Location provider:home office Participants present for the call: patient, provider Patient did not have a visit in the prior 7 days to address this/these issue(s).   History of Present Illness: Mr Weaver is a 73 yo male seen recently for chronic disease management. He was last seen on 12/29/18.  DM II poorly controlled,so Jardiance 10 mg added to his Lantus 40 U daily. He started Jardiance 12/30/18. Having night sweats and wonders if this is related to new med. He also started Celexa but he has taken it before. Taking both at the same time at night. Denies fever,chills,body aches,unusual fatigue,sore throat,or changes in smell or taste.   BS's much better: 112-180. Denies hypoglycemic events.  Also he was referred to GI because persistent nausea. He would like to see Dr Jinny Blossom.  He has checked BP since his last visit and it has been "good." Today 142/85. Negative for headache,visual changes,chest pain,dypnea,or palpitations.   States that he was supposed to have thyroid Bx arranged but has not heard about appt. He follows with Dr Loanne Drilling for thyroid nodule. Lab Results  Component Value Date   TSH 3.12 08/08/2018    Observations/Objective: Patient sounds cheerful and well on the phone. I do not appreciate any SOB. Speech and thought processing are grossly intact.Anxious. Patient reported vitals:BP (!) 142/85   Pulse 70     Assessment and Plan: 1. Type 2 diabetes mellitus with diabetic neuropathy, with long-term current use of insulin ([place]) Side effects of Jardiance as well as benefits discussed. He is glad with BS's improvement since he started med,so agrees to continue. Instructed about warning signs. F/U in 3-4 months.  2. Thyroid nodule Will try to find out which radiologist he was supposed to see for thyroid Bx.  3. Essential hypertension Improved. Continue current management. Continue monitoring BP at home. Low salt diet.   Follow Up Instructions: . Appt with Dr Jinny Blossom, GI, will be arranged. Take celexa in afternoon and Jardiance in am.   Return in about 3 months (around 04/05/2019) for DM II,HTN,anxiety. I discussed the assessment and treatment plan with the patient. He was provided an opportunity to ask questions and all were answered. The patient agreed with the plan and demonstrated an understanding of the instructions.   The patient was advised to call back or seek an in-person evaluation if the symptoms worsen or if the condition fails to improve as anticipated.  I provided 13 minutes of non-face-to-face time during this encounter.   Brynna Dobos [name], MD

## 2019-01-09 ENCOUNTER — Telehealth: Payer: Self-pay | Admitting: Cardiovascular Disease

## 2019-01-09 NOTE — Telephone Encounter (Signed)
New Message         COVID-19 Pre-Screening Questions:   In the past 7 to 10 days have you had a cough,  shortness of breath, headache, congestion, fever (100 or greater) body aches, chills, sore throat, or sudden loss of taste or sense of smell? NO  Have you been around anyone with known Covid 19. NO  Have you been around anyone who is awaiting Covid 19 test results in the past 7 to 10 days? Pts wife was tested and it was NEG   Have you been around anyone who has been exposed to Covid 19, or has mentioned symptoms of Covid 19 within the past 7 to 10 days? NO  If you have any concerns/questions about symptoms patients report during screening (either on the phone or at threshold). Contact the provider seeing the patient or DOD for further guidance.  If neither are available contact a member of the leadership team.

## 2019-01-10 ENCOUNTER — Ambulatory Visit: Payer: Medicare Other | Admitting: Cardiovascular Disease

## 2019-01-10 ENCOUNTER — Ambulatory Visit (INDEPENDENT_AMBULATORY_CARE_PROVIDER_SITE_OTHER): Payer: Medicare Other | Admitting: Cardiovascular Disease

## 2019-01-10 ENCOUNTER — Encounter: Payer: Self-pay | Admitting: Cardiovascular Disease

## 2019-01-10 ENCOUNTER — Other Ambulatory Visit: Payer: Self-pay

## 2019-01-10 VITALS — BP 152/84 | HR 67 | Temp 98.2°F | Ht 68.5 in | Wt 181.0 lb

## 2019-01-10 DIAGNOSIS — Z794 Long term (current) use of insulin: Secondary | ICD-10-CM

## 2019-01-10 DIAGNOSIS — R110 Nausea: Secondary | ICD-10-CM | POA: Diagnosis not present

## 2019-01-10 DIAGNOSIS — E782 Mixed hyperlipidemia: Secondary | ICD-10-CM | POA: Diagnosis not present

## 2019-01-10 DIAGNOSIS — I25810 Atherosclerosis of coronary artery bypass graft(s) without angina pectoris: Secondary | ICD-10-CM

## 2019-01-10 DIAGNOSIS — I10 Essential (primary) hypertension: Secondary | ICD-10-CM | POA: Diagnosis not present

## 2019-01-10 DIAGNOSIS — I723 Aneurysm of iliac artery: Secondary | ICD-10-CM

## 2019-01-10 DIAGNOSIS — Z95820 Peripheral vascular angioplasty status with implants and grafts: Secondary | ICD-10-CM

## 2019-01-10 DIAGNOSIS — E1140 Type 2 diabetes mellitus with diabetic neuropathy, unspecified: Secondary | ICD-10-CM

## 2019-01-10 MED ORDER — ONDANSETRON HCL 4 MG PO TABS: 4.0000 mg | ORAL_TABLET | Freq: Three times a day (TID) | ORAL | 0 refills | Status: DC | PRN

## 2019-01-10 NOTE — Progress Notes (Addendum)
Patient ID: [name], male   DOB: Sep 23, 1945, 73 y.o.   MRN: [id number]    Cardiology Office Note    Date:  01/10/2019   ID:  [name], [name] [birth year], MRN [id number]  PCP:  [name], [name], MD  Cardiologist:   Sanda Klein, MD   No chief complaint on file.   History of Present Illness:  [name] is a 73 y.o. male with a long-standing history of coronary artery disease who returns for follow-up.  Generally doing well from a cardiac point of view and has not had issues with angina or dyspnea with activity.  He continues to take care of most of the household and yard work, especially since his wife is severely limited by her back problems.  He seems to have less anxiety and uneasiness, which has plagued him a lot over the last 12 months and he believes was precipitated by taking Brilinta for his most recent stent.  After stopping antiplatelet medications he feels improved.  Tried taking a couple of different mood modulators including sertraline and citalopram, but believes these made him worse, so he stopped them.  His primary care provider put him on Jardiance for elevated blood sugars but he did not tolerate this since it caused severe diaphoresis and anxiety (I wonder whether he had hypoglycemia).  His gait remains a little unsteady, but he has not had any falls.  He does not have lower extremity edema, claudication, orthopnea, PND, syncope or dizziness.  He does complain of nausea that occurs almost immediately after he starts eating and is improved with Zofran.  He has called his gastroenterologist, Dr. Watt Climes, to make an appointment.  He asked me to refill his Zofran until he can get into see him.  He associates the onset of his complaints with using Brilinta after the stent he received in February 2019.  He felt better after this medication was replaced with oral, but the same complaints gradually resurfaced.  He last had ultrasound for his abdominal aortic aneurysm/iliac artery  aneurysm in mid July 2019. EVAR stent was normally positioned without endoleak and the maximum aortic diameter was only 2.0 cm.  Lower extremities had normal ABIs bilaterally.  Occasionally he has elevated blood pressure but he brought me a list of readings and for the most part his blood pressure is in acceptable range (128/76-140/85).  He is taking his statin 3 days a week and tolerates it well.   His LDL cholesterol just a few days ago was excellent at 68, but his HDL remains very low at 27.  He reports good glycemic control,, but his hemoglobin A1c was actually high at 8.6%, which is worsened from 7.5% last December.  He saw Dr. Jannifer Franklin to evaluate numerous atypical neurological complaints including memory deficit.  His MRI showed a moderate level of small vessel disease and atrophy which could explain his memory difficulties.  There was much more pronounced atrophy in the frontal and temporal insular region especially on the left side, raising the possibility that he could have frontotemporal dementia or Lewy body disease.  Dr. Jannifer Franklin did recommend that [name] should restart his clopidogrel but he does not want to take it again states he thinks it was because of a lot of his agitation.  He was supposed to undergo a biopsy of a right thyroid nodule, but this cannot be located when he showed up for the study and there is a plan to have a CT-guided biopsy at some point  in the future.  He underwent four-vessel bypass surgery in 2006 (LIMA-LAD, SVG x 3 to Diagonal, OM and PDA). In 2015 received a bare metal stent to the proximal RCA (4.5 mm vessel). On September 08, 2017 he underwent cardiac catheterization which showed a long segment of severe stenosis in the SVG to OM (chronically occluded circumflex), patent LIMA to occluded LAD proximal RCA stent with 80% stenosis in the distal branch of the RCA.  March 1 he underwent revascularization of the SVG-OM with 2 drug-eluting stents.  He did not tolerate  Brilinta and asked to be switched to clopidogrel. He has had EVAR for AAA amd received a stent in the large right common iliac artery aneurysm and is monitored at [facility] He has insulin requiring diabetes mellitus. He has chronically low HDL cholesterol, around 27 mg/dL.    Past Medical History:  Diagnosis Date  . Anxiety   . Arthritis    "back" (04/05/2014)  . Atrial fibrillation ([place])   . Blood in stool   . Bradycardia- with decrease of BB some improvement 09/10/2017  . CAD (coronary artery disease)   . 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 Dr Darcey Nora - to LAD, SVG to diagonal, SVG to OM, SVG to PDA ;  b.  LHC (9/15): ostial LAD occl, ostial CFX 60%, prox AVCFX 95%, RCA 95%, S-PDA occl, S-OM/Dx with Dx limb occl and prox 60%, L-LAD patent, EF 35-40% wtih ant-lat and apical HK-AK >> PCI:  BMS to native RCA   . Diverticulosis   . Dizziness   . DM type 2 causing complication ([place]) 3/0/8657  . GERD (gastroesophageal reflux disease)   . Hypertension   . Iliac artery aneurysm ([place])   . Ischemic cardiomyopathy    a. EF 35-40% by LV gram 03/2014  . Joint pain   . Kidney stones   . Kidney stones   . Lower back pain Jan 2015  . Mixed hyperlipidemia 08/16/2013  . Myocardial infarction ([place]) 03/2014 X 2?  . S/P angioplasty with stent DES mid SVG to OM and DES to prox. SVG to OM, DAPT for lifetime 09/10/2017  . Type II diabetes mellitus ([place])     Past Surgical History:  Procedure Laterality Date  . CARDIAC CATHETERIZATION  2006   "before OHS"  . CORONARY ANGIOPLASTY WITH STENT PLACEMENT  04/05/2014   "1"  . CORONARY ARTERY BYPASS GRAFT  2006   LIMA to LAD,SVG to diagonal,SVG to obtuse marginal,SVG to posterior descending  . CORONARY STENT INTERVENTION N/A 09/09/2017   Procedure: CORONARY STENT INTERVENTION;  Surgeon: Burnell Blanks, MD;  Location: [facility];  Service: Cardiovascular;  Laterality: N/A;  . CORONARY STENT PLACEMENT  09/09/2017  . CYSTOSCOPY W/ STONE MANIPULATION  1970's X  2  . CYSTOSCOPY W/ URETEROSCOPY W/ LITHOTRIPSY  ~ 2009  . ILIAC ARTERY STENT Left Aug. 10, 2011   Aorta to right ext iliac and left CIA stent repair   . LEFT HEART CATH AND CORS/GRAFTS ANGIOGRAPHY N/A 09/08/2017   Procedure: LEFT HEART CATH AND CORS/GRAFTS ANGIOGRAPHY;  Surgeon: [name], MD;  Location: [facility];  Service: Cardiovascular;  Laterality: N/A;  . LEFT HEART CATHETERIZATION WITH CORONARY/GRAFT ANGIOGRAM N/A 04/05/2014   Procedure: LEFT HEART CATHETERIZATION WITH [name];  Surgeon: [name], MD;  Location: [facility];  Service: Cardiovascular;  Laterality: N/A;  . NM MYOCAR PERF WALL MOTION  11/18/2009   low risk  . [place]  Outpatient Medications Prior to Visit  Medication Sig Dispense Refill  . amLODipine (NORVASC) 10 MG tablet Take 0.5 tablets (5 mg total) by mouth daily. 90 tablet 3  . aspirin 81 MG tablet Take 81 mg by mouth daily.     [name] glucose blood (ONE TOUCH ULTRA TEST) test strip USE TO TEST BLOOD SUGARS  DAILY. 100 each 5  . Insulin Pen Needle (ULTICARE MINI PEN NEEDLES) 31G X 6 MM MISC USE AS DIRECTED 100 each 3  . LANTUS SOLOSTAR 100 UNIT/ML Solostar Pen INJECT SUBCUTANEOUSLY 40  UNITS DAILY 45 mL 3  . losartan (COZAAR) 50 MG tablet TAKE 1 TABLET BY MOUTH TWO  TIMES DAILY 180 tablet 1  . nitroGLYCERIN (NITROSTAT) 0.4 MG SL tablet Place 1 tablet (0.4 mg total) under the tongue every 5 (five) minutes as needed for chest pain. 90 tablet 3  . ondansetron (ZOFRAN) 4 MG tablet Take 1 tablet (4 mg total) by mouth every 8 (eight) hours as needed for nausea or vomiting. 20 tablet 0  . pantoprazole (PROTONIX) 40 MG tablet TAKE 1 TABLET BY MOUTH  DAILY 90 tablet 1  . rosuvastatin (CRESTOR) 10 MG tablet Take 1 tablet (10 mg total) by mouth 4 (four) times a week. 64 tablet 3  . citalopram (CELEXA) 20 MG tablet Take 1 tablet (20 mg total) by mouth daily. 90 tablet 1  . empagliflozin (JARDIANCE) 10 MG TABS tablet  Take 10 mg by mouth daily. 30 tablet 4  . propranolol (INDERAL) 20 MG tablet TAKE 1 TABLET BY MOUTH TWO  TIMES DAILY (Patient not taking: Reported on 01/10/2019) 120 tablet 1   No facility-administered medications prior to visit.      Allergies:   Codeine and Valium   Social History   Socioeconomic History  . Marital status: Married    Spouse name: Not on file  . Number of children: 3  . Years of education: Not on file  . Highest education level: Not on file  Occupational History  . Not on file  Social Needs  . Financial resource strain: Not on file  . Food insecurity    Worry: Not on file    Inability: Not on file  . Transportation needs    Medical: Not on file    Non-medical: Not on file  Tobacco Use  . Smoking status: Former Smoker    Years: 2.00    Types: Cigars    Quit date: 09/09/2017    Years since quitting: 1.3  . Smokeless tobacco: Never Used  . Tobacco comment: states he smoked cigar   Substance and Sexual Activity  . Alcohol use: No  . Drug use: No  . Sexual activity: Yes  Lifestyle  . Physical activity    Days per week: Not on file    Minutes per session: Not on file  . Stress: Not on file  Relationships  . Social Herbalist on phone: Not on file    Gets together: Not on file    Attends religious service: Not on file    Active member of club or organization: Not on file    Attends meetings of clubs or organizations: Not on file    Relationship status: Not on file  Other Topics Concern  . Not on file  Social History Narrative   Lives with wife   Caffeine use: Soda sometimes   1/2 cup coffee in the morning sometimes   Right handed      Family  History:  The patient's family history includes Diabetes in his brother and mother; Heart disease in his mother, sister, and son; Hypertension in his mother.   ROS:   Please see the history of present illness.    ROS All other systems are reviewed and are negative  PHYSICAL EXAM:   VS:  BP (!)  152/84   Pulse 67   Temp 98.2 F (36.8 C)   Ht 5' 8.5" (1.74 m)   Wt 181 lb (82.1 kg)   SpO2 94%   BMI 27.12 kg/m      General: Alert, oriented x3, no distress, although he appears a little nervous he is much less agitated than he was at his last appointment Head: no evidence of trauma, PERRL, EOMI, no exophtalmos or lid lag, no myxedema, no xanthelasma; normal ears, nose and oropharynx Neck: normal jugular venous pulsations and no hepatojugular reflux; brisk carotid pulses without delay and no carotid bruits Chest: clear to auscultation, no signs of consolidation by percussion or palpation, normal fremitus, symmetrical and full respiratory excursions Cardiovascular: normal position and quality of the apical impulse, regular rhythm (with periods of bigeminy), normal first and second heart sounds, no murmurs, rubs or gallops Abdomen: no tenderness or distention, no masses by palpation, no abnormal pulsatility or arterial bruits, normal bowel sounds, no hepatosplenomegaly Extremities: no clubbing, cyanosis or edema; 2+ radial, ulnar and brachial pulses bilaterally; 2+ right femoral, posterior tibial and dorsalis pedis pulses; 2+ left femoral, posterior tibial and dorsalis pedis pulses; no subclavian or femoral bruits Neurological: grossly nonfocal Psych: Normal mood and affect     Wt Readings from Last 3 Encounters:  01/10/19 181 lb (82.1 kg)  12/29/18 181 lb 9.6 oz (82.4 kg)  09/08/18 185 lb 12.8 oz (84.3 kg)      Studies/Labs Reviewed:   EKG:  EKG is ordered today.  It shows sinus rhythm with intermittent atrial bigeminy, otherwise normal tracing without ischemic repolarization abnormalities.   Recent Labs: BMET    Component Value Date/Time   NA 136 12/25/2018 0837   K 4.1 12/25/2018 0837   CL 98 12/25/2018 0837   CO2 20 12/25/2018 0837   GLUCOSE 208 (H) 12/25/2018 0837   GLUCOSE 208 (H) 07/03/2018 1637   BUN 17 12/25/2018 0837   CREATININE 1.39 (H) 12/25/2018 0837    CREATININE 1.03 04/03/2014 1648   CALCIUM 9.4 12/25/2018 0837   GFRNONAA 50 (L) 12/25/2018 0837   GFRAA 58 (L) 12/25/2018 0837     Lipid Panel     Component Value Date/Time   CHOL 149 12/25/2018 0837   TRIG 269 (H) 12/25/2018 0837   HDL 27 (L) 12/25/2018 0837   CHOLHDL 5.5 (H) 12/25/2018 0837   CHOLHDL 5 12/28/2016 0936   VLDL 53.0 (H) 12/28/2016 0936   LDLCALC 68 12/25/2018 0837   LDLDIRECT 74.0 12/28/2016 0936   CBC:    Component Value Date/Time   WBC 8.8 03/21/2018 1202   HGB 15.3 03/21/2018 1202   HGB 14.4 11/28/2017 1150   HCT 44.4 03/21/2018 1202   HCT 42.3 11/28/2017 1150   PLT 267.0 03/21/2018 1202   PLT 161 11/28/2017 1150   MCV 84.0 03/21/2018 1202   MCV 88 11/28/2017 1150   NEUTROABS 11.0 (H) 02/02/2014 0009   LYMPHSABS 0.7 02/02/2014 0009   MONOABS 0.8 02/02/2014 0009   EOSABS 0.0 02/02/2014 0009   BASOSABS 0.0 02/02/2014 0009    BMET    Component Value Date/Time   NA 136 12/25/2018 0837  K 4.1 12/25/2018 0837   CL 98 12/25/2018 0837   CO2 20 12/25/2018 0837   GLUCOSE 208 (H) 12/25/2018 0837   GLUCOSE 208 (H) 07/03/2018 1637   BUN 17 12/25/2018 0837   CREATININE 1.39 (H) 12/25/2018 0837   CREATININE 1.03 04/03/2014 1648   CALCIUM 9.4 12/25/2018 0837   GFRNONAA 50 (L) 12/25/2018 0837   GFRAA 58 (L) 12/25/2018 2505     ASSESSMENT:    No diagnosis found.   PLAN:  In order of problems listed above:  1. CAD: Angina has not been an issue since his last stent procedure in February 2019. 2. CHF: No signs or symptoms of congestive heart failure exacerbation.  Clinically euvolemic.  Left ventricular systolic function has returned to normal as of his most recent echocardiogram. NYHA functional class I.   3. HTN: Satisfactory control. 4. PAD: He does not have complaints of claudication.  History of EVAR stent graft for aorto-iliac aneurysm in 2011, being followed in the vascular surgery clinic.  Favorable findings on last scan in July 2019.  5.  HLP: Back on statin with excellent LDL cholesterol, but with stubbornly low HDL, which is unlikely to improve without better glycemic control and more physical exercise. 6. DM: Poor glycemic control, worsened since his last appointment.  He did not tolerate Jardiance.  But I suspect his symptoms may have simply been related to an episode of hypoglycemia during concurrent treatment with insulin.  In the long run SGLT2 inhibitors would be an excellent choice for glucose control. 7. Cognitive issues: He does have some small vessel disease and a peculiar pattern of atrophy involving the frontotemporal region, but no specific diagnosis is firmly established.  Symptomatically he seems improved, although he still has some agitation.  He does not want to restart taking clopidogrel.  From a cardiac point of view this is not critical, since his stent is now well over 60 months old, will continue aspirin only for atherosclerotic coronary and cerebrovascular disease    Medication Adjustments/Labs and Tests Ordered: Current medicines are reviewed at length with the patient today.  Concerns regarding medicines are outlined above.  Medication changes, Labs and Tests ordered today are listed in the Patient Instructions below. Patient Instructions  Medication Instructions:  No changes If you need a refill on your cardiac medications before your next appointment, please call your pharmacy.   Lab work: None ordered  Testing/Procedures: None ordered  Follow-Up: At [facility], you and your health needs are our priority.  As part of our continuing mission to provide you with exceptional heart care, we have created designated Provider Care Teams.  These Care Teams include your primary Cardiologist (physician) and Advanced Practice Providers (APPs -  Physician Assistants and Nurse Practitioners) who all work together to provide you with the care you need, when you need it. You will need a follow up appointment in 6  months.  Please call our office 2 months in advance to schedule this appointment.  You may see Sanda Klein, MD or one of the following Advanced Practice Providers on your designated Care Team: [place], [place] . Fabian Sharp, PA-C        Signed, Sanda Klein, MD  01/10/2019 3:19 PM    [address], [place], [place]  [address] Phone: [phone]; Fax: [phone]

## 2019-01-10 NOTE — Patient Instructions (Signed)
Medication Instructions:  No changes If you need a refill on your cardiac medications before your next appointment, please call your pharmacy.   Lab work: None ordered  Testing/Procedures: None ordered  Follow-Up: At [facility], you and your health needs are our priority.  As part of our continuing mission to provide you with exceptional heart care, we have created designated Provider Care Teams.  These Care Teams include your primary Cardiologist (physician) and Advanced Practice Providers (APPs -  Physician Assistants and Nurse Practitioners) who all work together to provide you with the care you need, when you need it. You will need a follow up appointment in 6 months.  Please call our office 2 months in advance to schedule this appointment.  You may see Sanda Klein, MD or one of the following Advanced Practice Providers on your designated Care Team: [place], [place] . Fabian Sharp, PA-C

## 2019-01-14 ENCOUNTER — Other Ambulatory Visit: Payer: Self-pay | Admitting: Cardiovascular Disease

## 2019-01-14 ENCOUNTER — Other Ambulatory Visit: Payer: Self-pay | Admitting: Family Medicine

## 2019-01-22 ENCOUNTER — Ambulatory Visit: Payer: Medicare Other

## 2019-02-01 ENCOUNTER — Other Ambulatory Visit: Payer: Self-pay | Admitting: Cardiovascular Disease

## 2019-02-01 ENCOUNTER — Other Ambulatory Visit: Payer: Self-pay | Admitting: Family Medicine

## 2019-02-01 DIAGNOSIS — R251 Tremor, unspecified: Secondary | ICD-10-CM

## 2019-02-16 LAB — HM DIABETES EYE EXAM

## 2019-02-23 ENCOUNTER — Encounter: Payer: Self-pay | Admitting: Family Medicine

## 2019-02-26 ENCOUNTER — Other Ambulatory Visit: Payer: Self-pay | Admitting: Family Medicine

## 2019-03-07 ENCOUNTER — Other Ambulatory Visit: Payer: Self-pay

## 2019-03-08 ENCOUNTER — Ambulatory Visit
Admission: RE | Admit: 2019-03-08 | Discharge: 2019-03-08 | Disposition: A | Discharge status: Home / Self Care | Payer: Medicare Other | Source: Ambulatory Visit | Attending: Endocrinology | Admitting: Endocrinology

## 2019-03-08 ENCOUNTER — Other Ambulatory Visit (HOSPITAL_COMMUNITY)
Admission: RE | Admit: 2019-03-08 | Discharge: 2019-03-08 | Disposition: A | Discharge status: Home / Self Care | Payer: Medicare Other | Source: Ambulatory Visit | Attending: Interventional Radiology | Admitting: Interventional Radiology

## 2019-03-08 DIAGNOSIS — E041 Nontoxic single thyroid nodule: Secondary | ICD-10-CM | POA: Insufficient documentation

## 2019-03-12 ENCOUNTER — Other Ambulatory Visit: Payer: Self-pay

## 2019-03-12 ENCOUNTER — Ambulatory Visit: Payer: Medicare Other | Admitting: Endocrinology

## 2019-03-12 ENCOUNTER — Ambulatory Visit (INDEPENDENT_AMBULATORY_CARE_PROVIDER_SITE_OTHER): Payer: Medicare Other | Admitting: Endocrinology

## 2019-03-12 ENCOUNTER — Encounter: Payer: Self-pay | Admitting: Endocrinology

## 2019-03-12 VITALS — BP 150/80 | HR 80 | Ht 68.5 in | Wt 179.0 lb

## 2019-03-12 DIAGNOSIS — E041 Nontoxic single thyroid nodule: Secondary | ICD-10-CM | POA: Diagnosis not present

## 2019-03-12 NOTE — Progress Notes (Signed)
Subjective:    Patient ID: [name], male    DOB: 1945/12/03, 73 y.o.   MRN: [id number]  HPI Pt returns for f/u of thyroid nodule (dx'ed 2019; bx then showed beth cat 2; she is euthyroid off rx).  He does not notice the thyroid nodule Past Medical History:  Diagnosis Date  . Anxiety   . Arthritis    "back" (04/05/2014)  . Atrial fibrillation ([place])   . Blood in stool   . Bradycardia- with decrease of BB some improvement 09/10/2017  . CAD (coronary artery disease)   . 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 Dr Darcey Nora - to LAD, SVG to diagonal, SVG to OM, SVG to PDA ;  b.  LHC (9/15): ostial LAD occl, ostial CFX 60%, prox AVCFX 95%, RCA 95%, S-PDA occl, S-OM/Dx with Dx limb occl and prox 60%, L-LAD patent, EF 35-40% wtih ant-lat and apical HK-AK >> PCI:  BMS to native RCA   . Diverticulosis   . Dizziness   . DM type 2 causing complication ([place]) 123456  . GERD (gastroesophageal reflux disease)   . Hypertension   . Iliac artery aneurysm ([place])   . Ischemic cardiomyopathy    a. EF 35-40% by LV gram 03/2014  . Joint pain   . Kidney stones   . Kidney stones   . Lower back pain Jan 2015  . Mixed hyperlipidemia 08/16/2013  . Myocardial infarction ([place]) 03/2014 X 2?  . S/P angioplasty with stent DES mid SVG to OM and DES to prox. SVG to OM, DAPT for lifetime 09/10/2017  . Type II diabetes mellitus ([place])     Past Surgical History:  Procedure Laterality Date  . CARDIAC CATHETERIZATION  2006   "before OHS"  . CORONARY ANGIOPLASTY WITH STENT PLACEMENT  04/05/2014   "1"  . CORONARY ARTERY BYPASS GRAFT  2006   LIMA to LAD,SVG to diagonal,SVG to obtuse marginal,SVG to posterior descending  . CORONARY STENT INTERVENTION N/A 09/09/2017   Procedure: CORONARY STENT INTERVENTION;  Surgeon: Burnell Blanks, MD;  Location: [facility];  Service: Cardiovascular;  Laterality: N/A;  . CORONARY STENT PLACEMENT  09/09/2017  . CYSTOSCOPY W/ STONE MANIPULATION  1970's X 2  . CYSTOSCOPY W/  URETEROSCOPY W/ LITHOTRIPSY  ~ 2009  . ILIAC ARTERY STENT Left Aug. 10, 2011   Aorta to right ext iliac and left CIA stent repair   . LEFT HEART CATH AND CORS/GRAFTS ANGIOGRAPHY N/A 09/08/2017   Procedure: LEFT HEART CATH AND CORS/GRAFTS ANGIOGRAPHY;  Surgeon: [name], MD;  Location: [facility];  Service: Cardiovascular;  Laterality: N/A;  . LEFT HEART CATHETERIZATION WITH CORONARY/GRAFT ANGIOGRAM N/A 04/05/2014   Procedure: LEFT HEART CATHETERIZATION WITH [name];  Surgeon: [name], MD;  Location: [facility];  Service: Cardiovascular;  Laterality: N/A;  . NM MYOCAR PERF WALL MOTION  11/18/2009   low risk  . TONSILLECTOMY AND ADENOIDECTOMY  1951    Social History   Socioeconomic History  . Marital status: Married    Spouse name: Not on file  . Number of children: 3  . Years of education: Not on file  . Highest education level: Not on file  Occupational History  . Not on file  Social Needs  . Financial resource strain: Not on file  . Food insecurity    Worry: Not on file    Inability: Not on file  . Transportation needs    Medical: Not on file  Non-medical: Not on file  Tobacco Use  . Smoking status: Former Smoker    Years: 2.00    Types: Cigars    Quit date: 09/09/2017    Years since quitting: 1.5  . Smokeless tobacco: Never Used  . Tobacco comment: states he smoked cigar   Substance and Sexual Activity  . Alcohol use: No  . Drug use: No  . Sexual activity: Yes  Lifestyle  . Physical activity    Days per week: Not on file    Minutes per session: Not on file  . Stress: Not on file  Relationships  . Social Herbalist on phone: Not on file    Gets together: Not on file    Attends religious service: Not on file    Active member of club or organization: Not on file    Attends meetings of clubs or organizations: Not on file    Relationship status: Not on file  . Intimate partner violence    Fear of current or ex  partner: Not on file    Emotionally abused: Not on file    Physically abused: Not on file    Forced sexual activity: Not on file  Other Topics Concern  . Not on file  Social History Narrative   Lives with wife   Caffeine use: Soda sometimes   1/2 cup coffee in the morning sometimes   Right handed     Current Outpatient Medications on File Prior to Visit  Medication Sig Dispense Refill  . amLODipine (NORVASC) 10 MG tablet TAKE ONE-HALF TABLET BY  MOUTH DAILY 45 tablet 2  . aspirin 81 MG tablet Take 81 mg by mouth daily.     [name] glucose blood (ONE TOUCH ULTRA TEST) test strip USE TO TEST BLOOD SUGARS  DAILY. 100 each 5  . Insulin Pen Needle (ULTICARE MINI PEN NEEDLES) 31G X 6 MM MISC USE AS DIRECTED 100 each 0  . LANTUS SOLOSTAR 100 UNIT/ML Solostar Pen INJECT SUBCUTANEOUSLY 40  UNITS DAILY 45 mL 3  . losartan (COZAAR) 50 MG tablet TAKE 1 TABLET BY MOUTH TWO  TIMES DAILY 180 tablet 1  . ondansetron (ZOFRAN) 4 MG tablet Take 1 tablet (4 mg total) by mouth every 8 (eight) hours as needed for nausea or vomiting. 30 tablet 0  . pantoprazole (PROTONIX) 40 MG tablet TAKE 1 TABLET BY MOUTH  DAILY 90 tablet 1  . rosuvastatin (CRESTOR) 10 MG tablet TAKE 1 TABLET BY MOUTH 4  TIMES A WEEK. 52 tablet 3  . nitroGLYCERIN (NITROSTAT) 0.4 MG SL tablet Place 1 tablet (0.4 mg total) under the tongue every 5 (five) minutes as needed for chest pain. 90 tablet 3   No current facility-administered medications on file prior to visit.     Allergies  Allergen Reactions  . Codeine Nausea Only and Other (See Comments)    Cannot take on empty stomach.  . Valium Nausea Only and Other (See Comments)    Cannot take on empty stomach.    Family History  Problem Relation Age of Onset  . Diabetes Mother   . Hypertension Mother   . Heart disease Mother        AAA  . Heart disease Sister   . Heart disease Son        Heart Disease before age 36  . Diabetes Brother   . Thyroid disease Neg Hx     BP (!) 150/80    Pulse 80   Ht  5' 8.5" (1.74 m)   Wt 179 lb (81.2 kg)   SpO2 97%   BMI 26.82 kg/m   Review of Systems Denies neck pain.  Main symptom is anxiety.      Objective:   Physical Exam VITAL SIGNS:  See vs page GENERAL: no distress NECK: there is slight fullness at the right thyroid area, but I am not sure.     Lab Results  Component Value Date   TSH 3.12 08/08/2018      Assessment & Plan:  Thyroid nodule, clinically stable Anxiety: I told pt this is not thyroid-related  Patient Instructions  n further thyroid testing or treatment is needed now.  Please come back for a follow-up appointment in 1 year.

## 2019-03-12 NOTE — Patient Instructions (Signed)
n further thyroid testing or treatment is needed now.  Please come back for a follow-up appointment in 1 year.

## 2019-04-11 ENCOUNTER — Ambulatory Visit (INDEPENDENT_AMBULATORY_CARE_PROVIDER_SITE_OTHER): Payer: Medicare Other

## 2019-04-11 ENCOUNTER — Other Ambulatory Visit: Payer: Self-pay

## 2019-04-11 DIAGNOSIS — Z23 Encounter for immunization: Secondary | ICD-10-CM | POA: Diagnosis not present

## 2019-04-16 DIAGNOSIS — E118 Type 2 diabetes mellitus with unspecified complications: Secondary | ICD-10-CM | POA: Diagnosis not present

## 2019-04-16 DIAGNOSIS — I25810 Atherosclerosis of coronary artery bypass graft(s) without angina pectoris: Secondary | ICD-10-CM | POA: Diagnosis not present

## 2019-04-16 DIAGNOSIS — Z794 Long term (current) use of insulin: Secondary | ICD-10-CM | POA: Diagnosis not present

## 2019-04-16 DIAGNOSIS — E1165 Type 2 diabetes mellitus with hyperglycemia: Secondary | ICD-10-CM | POA: Diagnosis not present

## 2019-04-16 DIAGNOSIS — E785 Hyperlipidemia, unspecified: Secondary | ICD-10-CM | POA: Diagnosis not present

## 2019-05-03 ENCOUNTER — Other Ambulatory Visit: Payer: Self-pay | Admitting: Family Medicine

## 2019-05-28 DIAGNOSIS — E1122 Type 2 diabetes mellitus with diabetic chronic kidney disease: Secondary | ICD-10-CM | POA: Diagnosis not present

## 2019-05-28 DIAGNOSIS — Z794 Long term (current) use of insulin: Secondary | ICD-10-CM | POA: Diagnosis not present

## 2019-05-28 DIAGNOSIS — I25810 Atherosclerosis of coronary artery bypass graft(s) without angina pectoris: Secondary | ICD-10-CM | POA: Diagnosis not present

## 2019-05-28 DIAGNOSIS — E785 Hyperlipidemia, unspecified: Secondary | ICD-10-CM | POA: Diagnosis not present

## 2019-05-28 DIAGNOSIS — E1165 Type 2 diabetes mellitus with hyperglycemia: Secondary | ICD-10-CM | POA: Diagnosis not present

## 2019-07-03 ENCOUNTER — Other Ambulatory Visit: Payer: Self-pay

## 2019-07-03 NOTE — Patient Outreach (Signed)
[facility]) Care Management  07/03/2019  [name] 01/26/1946 [id number]   Medication Adherence call to Mr. [name] Telephone call to Patient regarding Medication Adherence unable to reach patient. [name] is showing past due on Losartan 50 mg under [name].   [facility] Management Direct Dial [phone]  Fax [phone] [email or web address]

## 2019-07-05 ENCOUNTER — Ambulatory Visit: Payer: Medicare Other | Attending: Internal Medicine

## 2019-07-05 ENCOUNTER — Other Ambulatory Visit: Payer: Self-pay

## 2019-07-05 DIAGNOSIS — Z20822 Contact with and (suspected) exposure to covid-19: Secondary | ICD-10-CM

## 2019-07-06 LAB — NOVEL CORONAVIRUS, NAA: SARS-CoV-2, NAA: NOT DETECTED

## 2019-07-09 ENCOUNTER — Encounter: Payer: Self-pay | Admitting: *Deleted

## 2019-07-09 ENCOUNTER — Other Ambulatory Visit: Payer: Self-pay | Admitting: *Deleted

## 2019-07-09 ENCOUNTER — Telehealth: Payer: Self-pay

## 2019-07-09 DIAGNOSIS — I10 Essential (primary) hypertension: Secondary | ICD-10-CM

## 2019-07-09 NOTE — Telephone Encounter (Signed)
Caller given negative result and verbalized understanding  

## 2019-07-10 ENCOUNTER — Other Ambulatory Visit: Payer: Self-pay

## 2019-07-10 NOTE — Patient Outreach (Signed)
[facility]) Care Management  07/10/2019  [name] 12-19-1945 [id number]   Medication Adherence call to Mr. [name] Hippa Identifiers Verify spoke with patient wife,patient is showing past due on Losartan 50 mg,Wife explain patient takes 1 tablet 2 times a day,she explain patient has medication at this time. [name] is showing past due under [place].   [place] Management Direct Dial [phone]  Fax [phone] [email or web address]

## 2019-07-16 DIAGNOSIS — I10 Essential (primary) hypertension: Secondary | ICD-10-CM | POA: Diagnosis not present

## 2019-07-17 ENCOUNTER — Ambulatory Visit: Payer: Medicare Other | Admitting: Cardiovascular Disease

## 2019-07-17 ENCOUNTER — Other Ambulatory Visit: Payer: Self-pay

## 2019-07-17 ENCOUNTER — Encounter: Payer: Self-pay | Admitting: Cardiovascular Disease

## 2019-07-17 VITALS — BP 177/92 | HR 65 | Ht 69.0 in | Wt 180.0 lb

## 2019-07-17 DIAGNOSIS — I739 Peripheral vascular disease, unspecified: Secondary | ICD-10-CM | POA: Diagnosis not present

## 2019-07-17 DIAGNOSIS — R110 Nausea: Secondary | ICD-10-CM

## 2019-07-17 DIAGNOSIS — I5032 Chronic diastolic (congestive) heart failure: Secondary | ICD-10-CM | POA: Diagnosis not present

## 2019-07-17 DIAGNOSIS — E785 Hyperlipidemia, unspecified: Secondary | ICD-10-CM

## 2019-07-17 DIAGNOSIS — I25810 Atherosclerosis of coronary artery bypass graft(s) without angina pectoris: Secondary | ICD-10-CM | POA: Diagnosis not present

## 2019-07-17 DIAGNOSIS — I10 Essential (primary) hypertension: Secondary | ICD-10-CM | POA: Diagnosis not present

## 2019-07-17 DIAGNOSIS — E1140 Type 2 diabetes mellitus with diabetic neuropathy, unspecified: Secondary | ICD-10-CM

## 2019-07-17 DIAGNOSIS — Z794 Long term (current) use of insulin: Secondary | ICD-10-CM

## 2019-07-17 LAB — BASIC METABOLIC PANEL
BUN/Creatinine Ratio: 9 — ABNORMAL LOW (ref 10–24)
BUN: 12 mg/dL (ref 8–27)
CO2: 24 mmol/L (ref 20–29)
Calcium: 9.7 mg/dL (ref 8.6–10.2)
Chloride: 101 mmol/L (ref 96–106)
Creatinine, Ser: 1.28 mg/dL — ABNORMAL HIGH (ref 0.76–1.27)
GFR calc Af Amer: 64 mL/min/{1.73_m2} (ref >59)
GFR calc non Af Amer: 55 mL/min/{1.73_m2} — ABNORMAL LOW (ref >59)
Glucose: 195 mg/dL — ABNORMAL HIGH (ref 65–99)
Potassium: 4.2 mmol/L (ref 3.5–5.2)
Sodium: 140 mmol/L (ref 134–144)

## 2019-07-17 MED ORDER — ROSUVASTATIN CALCIUM 10 MG PO TABS: ORAL_TABLET | ORAL | 3 refills | Status: DC

## 2019-07-17 MED ORDER — NITROGLYCERIN 0.4 MG SL SUBL: 0.4000 mg | SUBLINGUAL_TABLET | SUBLINGUAL | 1 refills | Status: DC | PRN

## 2019-07-17 MED ORDER — AMLODIPINE BESYLATE 10 MG PO TABS: 5.0000 mg | ORAL_TABLET | Freq: Every day | ORAL | 2 refills | Status: DC

## 2019-07-17 MED ORDER — LOSARTAN POTASSIUM 50 MG PO TABS: 50.0000 mg | ORAL_TABLET | Freq: Two times a day (BID) | ORAL | 1 refills | Status: DC

## 2019-07-17 NOTE — Patient Instructions (Signed)
Medication Instructions:  No changes *If you need a refill on your cardiac medications before your next appointment, please call your pharmacy*  Lab Work: None ordered If you have labs (blood work) drawn today and your tests are completely normal, you will receive your results only by: . MyChart Message (if you have MyChart) OR . A paper copy in the mail If you have any lab test that is abnormal or we need to change your treatment, we will call you to review the results.  Testing/Procedures: None ordered  Follow-Up: At [facility], you and your health needs are our priority.  As part of our continuing mission to provide you with exceptional heart care, we have created designated Provider Care Teams.  These Care Teams include your primary Cardiologist (physician) and Advanced Practice Providers (APPs -  Physician Assistants and Nurse Practitioners) who all work together to provide you with the care you need, when you need it.  Your next appointment:   12 month(s)  The format for your next appointment:   In Person  Provider:   You may see Mihai Croitoru, MD or one of the following Advanced Practice Providers on your designated Care Team:    Hao Meng, PA-C  Angela Duke, PA-C or   Krista Kroeger, PA-C  

## 2019-07-17 NOTE — Progress Notes (Signed)
Patient ID: FIDEL TOTA, male   DOB: April 12, 1946, 74 y.o.   MRN: [id number]    Cardiology Office Note    Date:  07/18/2019   ID:  [name], [name] 1946/06/20, MRN [id number]  PCP:  [name], [name], MD  Cardiologist:   Sanda Klein, MD   Chief Complaint  Patient presents with  . Coronary Artery Disease    History of Present Illness:  [name] is a 74 y.o. male with a long-standing history of coronary artery disease who returns for follow-up.  The patient specifically denies any chest pain at rest exertion, dyspnea at rest or with exertion, orthopnea, paroxysmal nocturnal dyspnea, syncope, palpitations, focal neurological deficits, intermittent claudication, lower extremity edema, unexplained weight gain, cough, hemoptysis or wheezing.  His problems with anxiety are greatly improved.  He felt very anxious when he was taking Brilinta following his last stent.    Glycemic control remains a challenge.  He had side effects to multiple newer medications and is now on Lantus, seeing Dr. Jerral Ralph.  Last hemoglobin A1c was 8.6% over the summer.  In June LDL cholesterol was excellent at 68 on 3 times weekly statin.  He last had ultrasound for his abdominal aortic aneurysm/iliac artery aneurysm in mid July 2019. EVAR stent was normally positioned without endoleak and the maximum aortic diameter was only 2.0 cm.  Lower extremities had normal ABIs bilaterally.  He saw Dr. Jannifer Franklin to evaluate numerous atypical neurological complaints including memory deficit.  His MRI showed a moderate level of small vessel disease and atrophy which could explain his memory difficulties.  There was much more pronounced atrophy in the frontal and temporal insular region especially on the left side, raising the possibility that he could have frontotemporal dementia or Lewy body disease.    March 08, 2019 FNA of thyroid nodule showed benign findings.  He underwent four-vessel bypass surgery in 2006 (LIMA-LAD, SVG x  3 to Diagonal, OM and PDA). In 2015 received a bare metal stent to the proximal RCA (4.5 mm vessel). On September 08, 2017 he underwent cardiac catheterization which showed a long segment of severe stenosis in the SVG to OM (chronically occluded circumflex), patent LIMA to occluded LAD proximal RCA stent with 80% stenosis in the distal branch of the RCA.  September 09, 2017 he underwent revascularization of the SVG-OM with 2 drug-eluting stents.  He did not tolerate Brilinta and asked to be switched to clopidogrel. He has had EVAR for AAA amd received a stent in the large right common iliac artery aneurysm and is monitored at [facility] He has insulin requiring diabetes mellitus. He has chronically low HDL cholesterol, around 27 mg/dL.  Past Medical History:  Diagnosis Date  . Anxiety   . Arthritis    "back" (04/05/2014)  . Atrial fibrillation ([place])   . Blood in stool   . Bradycardia- with decrease of BB some improvement 09/10/2017  . CAD (coronary artery disease)   . 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 Dr Darcey Nora - to LAD, SVG to diagonal, SVG to OM, SVG to PDA ;  b.  LHC (9/15): ostial LAD occl, ostial CFX 60%, prox AVCFX 95%, RCA 95%, S-PDA occl, S-OM/Dx with Dx limb occl and prox 60%, L-LAD patent, EF 35-40% wtih ant-lat and apical HK-AK >> PCI:  BMS to native RCA   . Diverticulosis   . Dizziness   . DM type 2 causing complication ([place]) 123456  . GERD (gastroesophageal reflux disease)   .  Hypertension   . Iliac artery aneurysm ([place])   . Ischemic cardiomyopathy    a. EF 35-40% by LV gram 03/2014  . Joint pain   . Kidney stones   . Kidney stones   . Lower back pain Jan 2015  . Mixed hyperlipidemia 08/16/2013  . Myocardial infarction ([place]) 03/2014 X 2?  . S/P angioplasty with stent DES mid SVG to OM and DES to prox. SVG to OM, DAPT for lifetime 09/10/2017  . Type II diabetes mellitus ([place])     Past Surgical History:  Procedure Laterality Date  . CARDIAC CATHETERIZATION  2006   "before OHS"  . CORONARY  ANGIOPLASTY WITH STENT PLACEMENT  04/05/2014   "1"  . CORONARY ARTERY BYPASS GRAFT  2006   LIMA to LAD,SVG to diagonal,SVG to obtuse marginal,SVG to posterior descending  . CORONARY STENT INTERVENTION N/A 09/09/2017   Procedure: CORONARY STENT INTERVENTION;  Surgeon: Burnell Blanks, MD;  Location: [facility];  Service: Cardiovascular;  Laterality: N/A;  . CORONARY STENT PLACEMENT  09/09/2017  . CYSTOSCOPY W/ STONE MANIPULATION  1970's X 2  . CYSTOSCOPY W/ URETEROSCOPY W/ LITHOTRIPSY  ~ 2009  . ILIAC ARTERY STENT Left Aug. 10, 2011   Aorta to right ext iliac and left CIA stent repair   . LEFT HEART CATH AND CORS/GRAFTS ANGIOGRAPHY N/A 09/08/2017   Procedure: LEFT HEART CATH AND CORS/GRAFTS ANGIOGRAPHY;  Surgeon: [name], MD;  Location: [facility];  Service: Cardiovascular;  Laterality: N/A;  . LEFT HEART CATHETERIZATION WITH CORONARY/GRAFT ANGIOGRAM N/A 04/05/2014   Procedure: LEFT HEART CATHETERIZATION WITH [name];  Surgeon: [name], MD;  Location: [facility];  Service: Cardiovascular;  Laterality: N/A;  . NM MYOCAR PERF WALL MOTION  11/18/2009   low risk  . TONSILLECTOMY AND ADENOIDECTOMY  1951    Outpatient Medications Prior to Visit  Medication Sig Dispense Refill  . aspirin 81 MG tablet Take 81 mg by mouth daily.     . Insulin Pen Needle (ULTICARE MINI PEN NEEDLES) 31G X 6 MM MISC USE AS DIRECTED 100 each 0  . LANTUS SOLOSTAR 100 UNIT/ML Solostar Pen INJECT SUBCUTANEOUSLY 40  UNITS DAILY 45 mL 3  . ondansetron (ZOFRAN) 4 MG tablet Take 1 tablet (4 mg total) by mouth every 8 (eight) hours as needed for nausea or vomiting. 30 tablet 0  . ONETOUCH ULTRA test strip CHECK BLOOD SUGAR 3 TIMES A DAY OR AS DIRECTED 100 strip 0  . pantoprazole (PROTONIX) 40 MG tablet TAKE 1 TABLET BY MOUTH  DAILY 90 tablet 1  . amLODipine (NORVASC) 10 MG tablet TAKE ONE-HALF TABLET BY  MOUTH DAILY 45 tablet 2  . losartan (COZAAR) 50 MG tablet TAKE 1 TABLET  BY MOUTH TWO  TIMES DAILY 180 tablet 1  . nitroGLYCERIN (NITROSTAT) 0.4 MG SL tablet Place 1 tablet (0.4 mg total) under the tongue every 5 (five) minutes as needed for chest pain. 90 tablet 3  . rosuvastatin (CRESTOR) 10 MG tablet TAKE 1 TABLET BY MOUTH 4  TIMES A WEEK. 52 tablet 3   No facility-administered medications prior to visit.     Allergies:   Codeine and Valium   Social History   Socioeconomic History  . Marital status: Married    Spouse name: Not on file  . Number of children: 3  . Years of education: Not on file  . Highest education level: Not on file  Occupational History  . Not on file  Tobacco Use  . Smoking status: Former Smoker    Years: 2.00    Types: Cigars    Quit date: 09/09/2017    Years since quitting: 1.8  . Smokeless tobacco: Never Used  . Tobacco comment: states he smoked cigar   Substance and Sexual Activity  . Alcohol use: No  . Drug use: No  . Sexual activity: Yes  Other Topics Concern  . Not on file  Social History Narrative   Lives with wife   Caffeine use: Soda sometimes   1/2 cup coffee in the morning sometimes   Right handed    Social Determinants of Health   Financial Resource Strain:   . Difficulty of Paying Living Expenses: Not on file  Food Insecurity:   . Worried About Charity fundraiser in the Last Year: Not on file  . Ran Out of Food in the Last Year: Not on file  Transportation Needs:   . Lack of Transportation (Medical): Not on file  . Lack of Transportation (Non-Medical): Not on file  Physical Activity:   . Days of Exercise per Week: Not on file  . Minutes of Exercise per Session: Not on file  Stress:   . Feeling of Stress : Not on file  Social Connections:   . Frequency of Communication with Friends and Family: Not on file  . Frequency of Social Gatherings with Friends and Family: Not on file  . Attends Religious Services: Not on file  . Active Member of Clubs or Organizations: Not on file  . Attends Theatre manager Meetings: Not on file  . Marital Status: Not on file     Family History:  The patient's family history includes Diabetes in his brother and mother; Heart disease in his mother, sister, and son; Hypertension in his mother.   ROS:   Please see the history of present illness.    ROS All other systems are reviewed and are negative.   PHYSICAL EXAM:   VS:  BP (!) 177/92   Pulse 65   Ht 5\' 9"  (1.753 m)   Wt 180 lb (81.6 kg)   SpO2 97%   BMI 26.58 kg/m     General: Alert, oriented x3, no distress, mildly overweight Head: no evidence of trauma, PERRL, EOMI, no exophtalmos or lid lag, no myxedema, no xanthelasma; normal ears, nose and oropharynx Neck: normal jugular venous pulsations and no hepatojugular reflux; brisk carotid pulses without delay and no carotid bruits Chest: clear to auscultation, no signs of consolidation by percussion or palpation, normal fremitus, symmetrical and full respiratory excursions Cardiovascular: normal position and quality of the apical impulse, regular rhythm, normal first and second heart sounds, no murmurs, rubs or gallops Abdomen: no tenderness or distention, no masses by palpation, no abnormal pulsatility or arterial bruits, normal bowel sounds, no hepatosplenomegaly Extremities: no clubbing, cyanosis or edema; 2+ radial, ulnar and brachial pulses bilaterally; 2+ right femoral, posterior tibial and dorsalis pedis pulses; 2+ left femoral, posterior tibial and dorsalis pedis pulses; no subclavian or femoral bruits Neurological: grossly nonfocal Psych: Normal mood and affect   Wt Readings from Last 3 Encounters:  07/17/19 180 lb (81.6 kg)  03/12/19 179 lb (81.2 kg)  01/10/19 181 lb (82.1 kg)      Studies/Labs Reviewed:   EKG:  EKG is ordered today.  Shows sinus rhythm with PACs, poor R wave progression, nonspecific intraventricular conduction delay with QRS 110 ms, QTC 474 ms Recent Labs: BMET    Component Value  Date/Time   NA 140  07/16/2019 1159   K 4.2 07/16/2019 1159   CL 101 07/16/2019 1159   CO2 24 07/16/2019 1159   GLUCOSE 195 (H) 07/16/2019 1159   GLUCOSE 208 (H) 07/03/2018 1637   BUN 12 07/16/2019 1159   CREATININE 1.28 (H) 07/16/2019 1159   CREATININE 1.03 04/03/2014 1648   CALCIUM 9.7 07/16/2019 1159   GFRNONAA 55 (L) 07/16/2019 1159   GFRAA 64 07/16/2019 1159     Lipid Panel     Component Value Date/Time   CHOL 149 12/25/2018 0837   TRIG 269 (H) 12/25/2018 0837   HDL 27 (L) 12/25/2018 0837   CHOLHDL 5.5 (H) 12/25/2018 0837   CHOLHDL 5 12/28/2016 0936   VLDL 53.0 (H) 12/28/2016 0936   LDLCALC 68 12/25/2018 0837   LDLDIRECT 74.0 12/28/2016 0936   CBC:    Component Value Date/Time   WBC 8.8 03/21/2018 1202   HGB 15.3 03/21/2018 1202   HGB 14.4 11/28/2017 1150   HCT 44.4 03/21/2018 1202   HCT 42.3 11/28/2017 1150   PLT 267.0 03/21/2018 1202   PLT 161 11/28/2017 1150   MCV 84.0 03/21/2018 1202   MCV 88 11/28/2017 1150   NEUTROABS 11.0 (H) 02/02/2014 0009   LYMPHSABS 0.7 02/02/2014 0009   MONOABS 0.8 02/02/2014 0009   EOSABS 0.0 02/02/2014 0009   BASOSABS 0.0 02/02/2014 0009    BMET    Component Value Date/Time   NA 140 07/16/2019 1159   K 4.2 07/16/2019 1159   CL 101 07/16/2019 1159   CO2 24 07/16/2019 1159   GLUCOSE 195 (H) 07/16/2019 1159   GLUCOSE 208 (H) 07/03/2018 1637   BUN 12 07/16/2019 1159   CREATININE 1.28 (H) 07/16/2019 1159   CREATININE 1.03 04/03/2014 1648   CALCIUM 9.7 07/16/2019 1159   GFRNONAA 55 (L) 07/16/2019 1159   GFRAA 64 07/16/2019 1159     ASSESSMENT:    1. 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   2. Nausea without vomiting   3. Chronic diastolic heart failure ([place])   4. Essential hypertension   5. PAD (peripheral artery disease) ([place])   6. Dyslipidemia (high LDL; low HDL)   7. Type 2 diabetes mellitus with diabetic neuropathy, with long-term current use of insulin (HCC)      PLAN:   In order of problems listed above:  1. CAD: Remains asymptomatic since his last stent procedure (DES x2 to SVG-OM) in March 2019. 2. CHF: Has had transient reduction in left ventricular systolic function related to ischemia, last echo showed normal ventricular function. 3. HTN: Satisfactory control. 4. PAD: He does not have complaints of claudication.  History of EVAR stent graft for aorto-iliac aneurysm in 2011, being followed in the vascular surgery clinic.  Favorable findings on last scan in July 2019.  5. HLP: LDL in target range.  Chronically low HDL. 6. DM: Excellent control has improved, but remains suboptimal.  Did not tolerate SGLT2 inhibitors ([place]). 7. Cognitive issues: He does have some small vessel disease and a peculiar pattern of atrophy involving the frontotemporal region, but no specific diagnosis is firmly established.  Symptomatically he seems improved, although he still has some agitation.  He does not want to restart taking clopidogrel.  From a cardiac point of view this is not critical, since his stent is now well over 29 months old, will continue aspirin only for atherosclerotic coronary and cerebrovascular disease    Medication Adjustments/Labs and Tests Ordered:  Current medicines are reviewed at length with the patient today.  Concerns regarding medicines are outlined above.  Medication changes, Labs and Tests ordered today are listed in the Patient Instructions below. Patient Instructions  Medication Instructions:  No changes *If you need a refill on your cardiac medications before your next appointment, please call your pharmacy*  Lab Work: None ordered If you have labs (blood work) drawn today and your tests are completely normal, you will receive your results only by: [name] MyChart Message (if you have MyChart) OR . A paper copy in the mail If you have any lab test that is abnormal or we need to change your treatment, we will call you to review the  results.  Testing/Procedures: None ordered  Follow-Up: At [facility], you and your health needs are our priority.  As part of our continuing mission to provide you with exceptional heart care, we have created designated Provider Care Teams.  These Care Teams include your primary Cardiologist (physician) and Advanced Practice Providers (APPs -  Physician Assistants and Nurse Practitioners) who all work together to provide you with the care you need, when you need it.  Your next appointment:   12 month(s)  The format for your next appointment:   In Person  Provider:   You may see Sanda Klein, MD or one of the following Advanced Practice Providers on your designated Care Team:    Almyra Deforest, PA-C  Fabian Sharp, [place] or   Roby Lofts, PA-C         Signed, Sanda Klein, MD  07/18/2019 6:46 PM    [address], [place], [place]  [address] Phone: [phone]; Fax: [phone]

## 2019-07-18 ENCOUNTER — Encounter: Payer: Self-pay | Admitting: Cardiovascular Disease

## 2019-07-23 ENCOUNTER — Other Ambulatory Visit: Payer: Self-pay | Admitting: Cardiovascular Disease

## 2019-07-23 DIAGNOSIS — R110 Nausea: Secondary | ICD-10-CM

## 2019-07-23 NOTE — Telephone Encounter (Signed)
*  STAT* If patient is at the pharmacy, call can be transferred to refill team.   1. Which medications need to be refilled? (please list name of each medication and dose if known)  ondansetron (ZOFRAN) 4 MG tablet  2. Which pharmacy/location (including street and city if local pharmacy) is medication to be sent to? [facility], [place]  3. Do they need a 30 day or 90 day supply? [place]

## 2019-07-24 DIAGNOSIS — I25810 Atherosclerosis of coronary artery bypass graft(s) without angina pectoris: Secondary | ICD-10-CM | POA: Diagnosis not present

## 2019-07-24 DIAGNOSIS — Z794 Long term (current) use of insulin: Secondary | ICD-10-CM | POA: Diagnosis not present

## 2019-07-24 DIAGNOSIS — E1122 Type 2 diabetes mellitus with diabetic chronic kidney disease: Secondary | ICD-10-CM | POA: Diagnosis not present

## 2019-07-24 DIAGNOSIS — E785 Hyperlipidemia, unspecified: Secondary | ICD-10-CM | POA: Diagnosis not present

## 2019-07-24 DIAGNOSIS — E1165 Type 2 diabetes mellitus with hyperglycemia: Secondary | ICD-10-CM | POA: Diagnosis not present

## 2019-07-24 MED ORDER — ONDANSETRON HCL 4 MG PO TABS: 4.0000 mg | ORAL_TABLET | Freq: Three times a day (TID) | ORAL | 0 refills | Status: DC | PRN

## 2019-08-02 DIAGNOSIS — L570 Actinic keratosis: Secondary | ICD-10-CM | POA: Diagnosis not present

## 2019-08-20 DIAGNOSIS — Z789 Other specified health status: Secondary | ICD-10-CM | POA: Diagnosis not present

## 2019-08-20 DIAGNOSIS — I10 Essential (primary) hypertension: Secondary | ICD-10-CM | POA: Diagnosis not present

## 2019-08-20 DIAGNOSIS — I2510 Atherosclerotic heart disease of native coronary artery without angina pectoris: Secondary | ICD-10-CM | POA: Diagnosis not present

## 2019-08-20 DIAGNOSIS — E785 Hyperlipidemia, unspecified: Secondary | ICD-10-CM | POA: Diagnosis not present

## 2019-08-20 DIAGNOSIS — E119 Type 2 diabetes mellitus without complications: Secondary | ICD-10-CM | POA: Diagnosis not present

## 2019-08-21 ENCOUNTER — Ambulatory Visit: Payer: Medicare Other | Admitting: Cardiovascular Disease

## 2019-08-21 DIAGNOSIS — Z794 Long term (current) use of insulin: Secondary | ICD-10-CM | POA: Diagnosis not present

## 2019-08-21 DIAGNOSIS — E1122 Type 2 diabetes mellitus with diabetic chronic kidney disease: Secondary | ICD-10-CM | POA: Diagnosis not present

## 2019-08-21 DIAGNOSIS — E1165 Type 2 diabetes mellitus with hyperglycemia: Secondary | ICD-10-CM | POA: Diagnosis not present

## 2019-08-21 DIAGNOSIS — I25810 Atherosclerosis of coronary artery bypass graft(s) without angina pectoris: Secondary | ICD-10-CM | POA: Diagnosis not present

## 2019-08-21 DIAGNOSIS — E785 Hyperlipidemia, unspecified: Secondary | ICD-10-CM | POA: Diagnosis not present

## 2019-09-20 DIAGNOSIS — Z789 Other specified health status: Secondary | ICD-10-CM | POA: Diagnosis not present

## 2019-09-20 DIAGNOSIS — E785 Hyperlipidemia, unspecified: Secondary | ICD-10-CM | POA: Diagnosis not present

## 2019-09-20 DIAGNOSIS — I10 Essential (primary) hypertension: Secondary | ICD-10-CM | POA: Diagnosis not present

## 2019-09-25 DIAGNOSIS — M25561 Pain in right knee: Secondary | ICD-10-CM | POA: Diagnosis not present

## 2019-09-25 DIAGNOSIS — S83241A Other tear of medial meniscus, current injury, right knee, initial encounter: Secondary | ICD-10-CM | POA: Diagnosis not present

## 2019-10-17 DIAGNOSIS — M1711 Unilateral primary osteoarthritis, right knee: Secondary | ICD-10-CM | POA: Diagnosis not present

## 2019-10-17 DIAGNOSIS — M25561 Pain in right knee: Secondary | ICD-10-CM | POA: Diagnosis not present

## 2019-10-18 ENCOUNTER — Other Ambulatory Visit: Payer: Self-pay | Admitting: Family Medicine

## 2019-11-07 DIAGNOSIS — E785 Hyperlipidemia, unspecified: Secondary | ICD-10-CM | POA: Diagnosis not present

## 2019-11-07 DIAGNOSIS — E119 Type 2 diabetes mellitus without complications: Secondary | ICD-10-CM | POA: Diagnosis not present

## 2019-11-07 DIAGNOSIS — Z789 Other specified health status: Secondary | ICD-10-CM | POA: Diagnosis not present

## 2019-11-15 ENCOUNTER — Telehealth: Payer: Self-pay | Admitting: Family Medicine

## 2019-11-15 NOTE — Chronic Care Management (AMB) (Signed)
  Chronic Care Management   Outreach Note  11/15/2019 Name: [name] MRN: [id number] DOB: 06-27-1946  Referred by: [name], [name], MD Reason for referral : Chronic Care Management   An unsuccessful telephone outreach was attempted today. The patient was referred to the pharmacist for assistance with care management and care coordination.   Follow Up Plan:   [place]

## 2019-11-23 ENCOUNTER — Telehealth: Payer: Self-pay | Admitting: Family Medicine

## 2019-11-23 NOTE — Chronic Care Management (AMB) (Signed)
  Chronic Care Management   Outreach Note  11/23/2019 Name: [name] MRN: [id number] DOB: 09-Dec-1945  Referred by: [name], [name], MD Reason for referral : Chronic Care Management   An unsuccessful telephone outreach was attempted today. The patient was referred to the pharmacist for assistance with care management and care coordination.   Follow Up Plan:   [place]

## 2019-11-28 DIAGNOSIS — Z789 Other specified health status: Secondary | ICD-10-CM | POA: Diagnosis not present

## 2019-11-28 DIAGNOSIS — E785 Hyperlipidemia, unspecified: Secondary | ICD-10-CM | POA: Diagnosis not present

## 2019-11-28 DIAGNOSIS — I10 Essential (primary) hypertension: Secondary | ICD-10-CM | POA: Diagnosis not present

## 2019-12-26 DIAGNOSIS — I10 Essential (primary) hypertension: Secondary | ICD-10-CM | POA: Diagnosis not present

## 2019-12-26 DIAGNOSIS — E785 Hyperlipidemia, unspecified: Secondary | ICD-10-CM | POA: Diagnosis not present

## 2019-12-26 DIAGNOSIS — Z789 Other specified health status: Secondary | ICD-10-CM | POA: Diagnosis not present

## 2020-01-23 DIAGNOSIS — I10 Essential (primary) hypertension: Secondary | ICD-10-CM | POA: Diagnosis not present

## 2020-01-23 DIAGNOSIS — E119 Type 2 diabetes mellitus without complications: Secondary | ICD-10-CM | POA: Diagnosis not present

## 2020-01-23 DIAGNOSIS — Z789 Other specified health status: Secondary | ICD-10-CM | POA: Diagnosis not present

## 2020-01-31 ENCOUNTER — Other Ambulatory Visit: Payer: Self-pay | Admitting: Cardiovascular Disease

## 2020-02-12 DIAGNOSIS — E1122 Type 2 diabetes mellitus with diabetic chronic kidney disease: Secondary | ICD-10-CM | POA: Diagnosis not present

## 2020-02-12 DIAGNOSIS — E785 Hyperlipidemia, unspecified: Secondary | ICD-10-CM | POA: Diagnosis not present

## 2020-02-12 DIAGNOSIS — I25810 Atherosclerosis of coronary artery bypass graft(s) without angina pectoris: Secondary | ICD-10-CM | POA: Diagnosis not present

## 2020-02-12 DIAGNOSIS — Z794 Long term (current) use of insulin: Secondary | ICD-10-CM | POA: Diagnosis not present

## 2020-02-12 DIAGNOSIS — E1165 Type 2 diabetes mellitus with hyperglycemia: Secondary | ICD-10-CM | POA: Diagnosis not present

## 2020-02-18 ENCOUNTER — Other Ambulatory Visit: Payer: Self-pay | Admitting: Family Medicine

## 2020-02-19 DIAGNOSIS — I10 Essential (primary) hypertension: Secondary | ICD-10-CM | POA: Diagnosis not present

## 2020-02-19 DIAGNOSIS — E119 Type 2 diabetes mellitus without complications: Secondary | ICD-10-CM | POA: Diagnosis not present

## 2020-02-19 DIAGNOSIS — Z789 Other specified health status: Secondary | ICD-10-CM | POA: Diagnosis not present

## 2020-03-11 ENCOUNTER — Ambulatory Visit: Payer: Medicare Other | Admitting: Endocrinology

## 2020-03-21 ENCOUNTER — Telehealth: Payer: Self-pay | Admitting: Cardiovascular Disease

## 2020-03-21 DIAGNOSIS — R110 Nausea: Secondary | ICD-10-CM

## 2020-03-21 MED ORDER — ONDANSETRON HCL 4 MG PO TABS: 4.0000 mg | ORAL_TABLET | Freq: Three times a day (TID) | ORAL | 0 refills | Status: DC | PRN

## 2020-03-21 NOTE — Telephone Encounter (Signed)
OK to refill 30 days, future requests to PCP, please

## 2020-03-21 NOTE — Telephone Encounter (Signed)
Pt c/o medication issue:  1. Name of Medication:ondansetron (ZOFRAN) 4 MG tablet   2. How are you currently taking this medication (dosage and times per day)? As written   3. Are you having a reaction (difficulty breathing--STAT)? No  4. What is your medication issue? Patient leaving to go out of town and doesn't have any medication or refills left. Needs a prescription sent in from Dr. Orene Desanctis.

## 2020-03-21 NOTE — Telephone Encounter (Signed)
Called and spoke with pt's wife per DPR. Notified that we would refill the zofran this time but after this it needs to come from his PCP. Wife verbalized understanding with no other questions at this time.

## 2020-03-24 IMAGING — CT CT HEAD W/O CM
3 of 4 series · 16 of 47 positions shown, 19 images · non-contrast
Comparison: None.

CLINICAL DATA: Tremors, vertigo, memory loss, confusion and
unsteady gait since beginning Plavix in September 2017.

EXAM:
CT HEAD WITHOUT CONTRAST
TECHNIQUE: Contiguous axial images were obtained from the base of the skull
through the vertex without intravenous contrast.

[Series 2: head 5.00 hr40 s3 ibhc · axial · 0.41mm/px · z∈[-528,-398]mm · 10 of 32 slices shown, 13 images]
[im 3/32  brain]
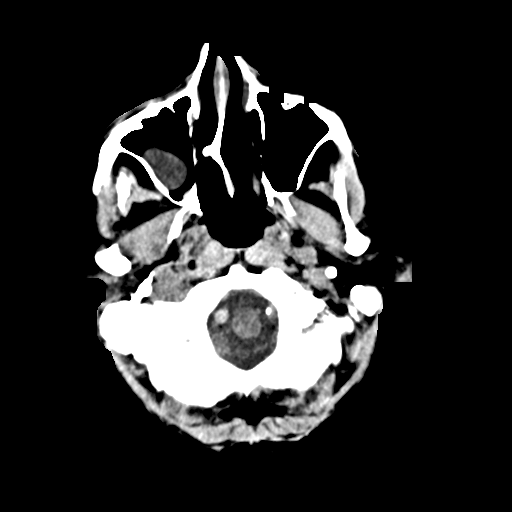
[im 3/32  bone]
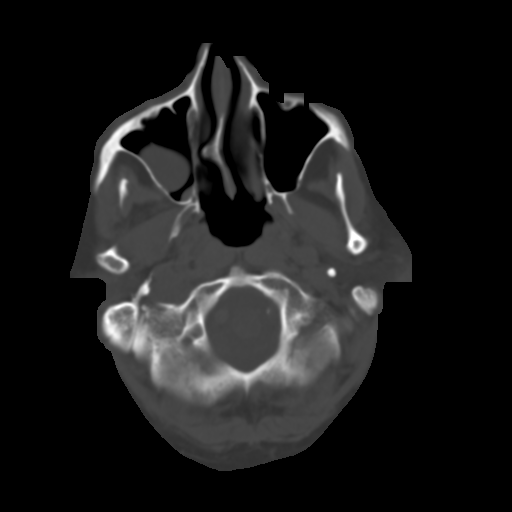
[im 5/32  brain]
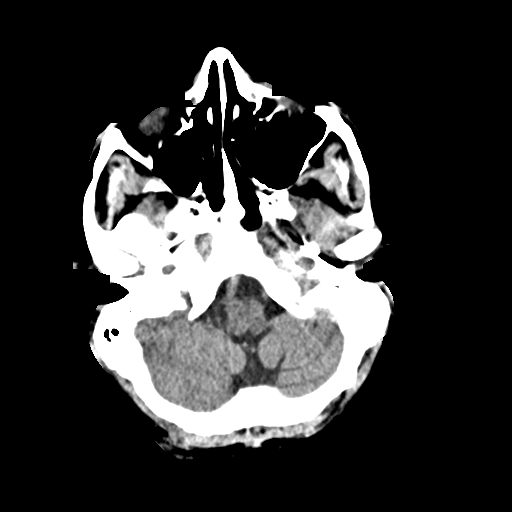
[im 9/32  brain]
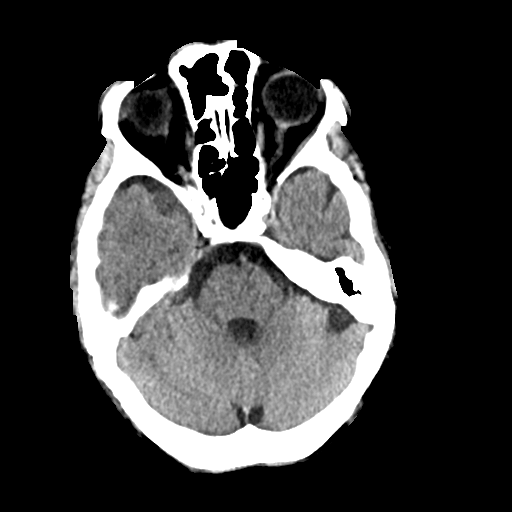
[im 12/32  brain]
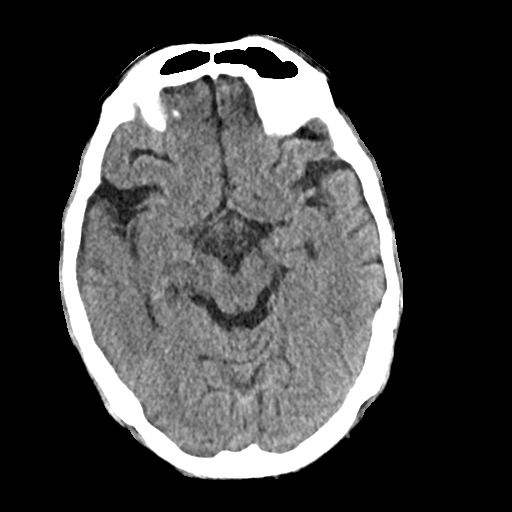
[im 14/32  brain]
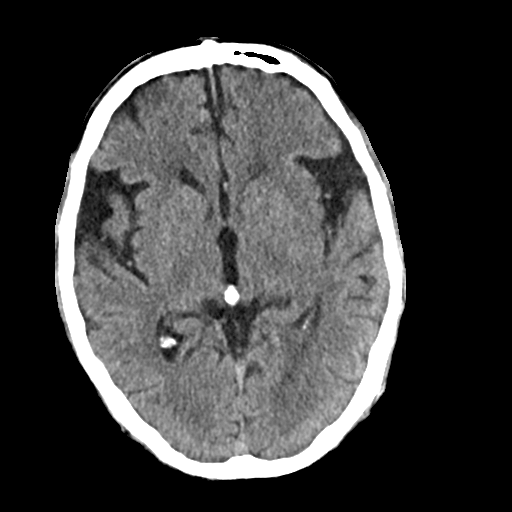
[im 14/32  bone]
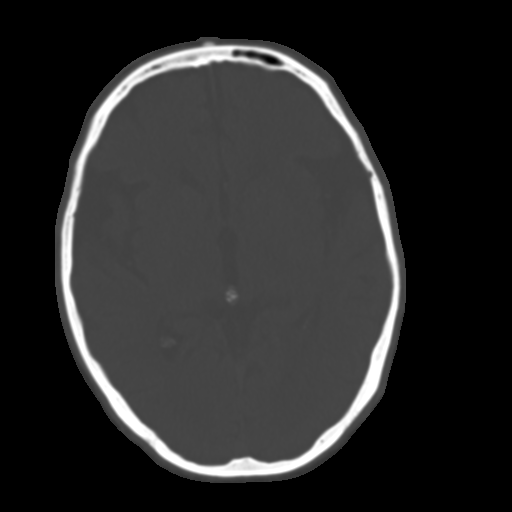
[im 18/32  brain]
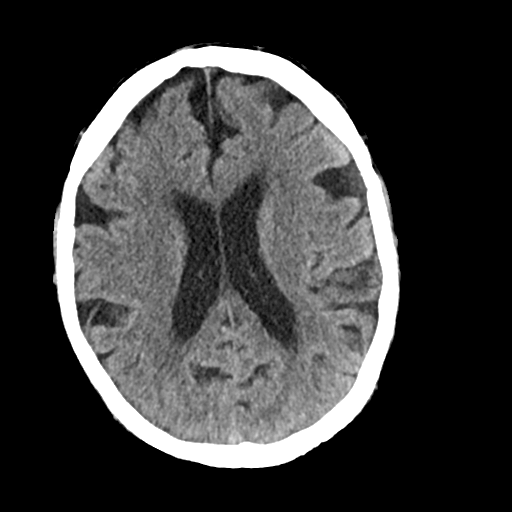
[im 20/32  brain]
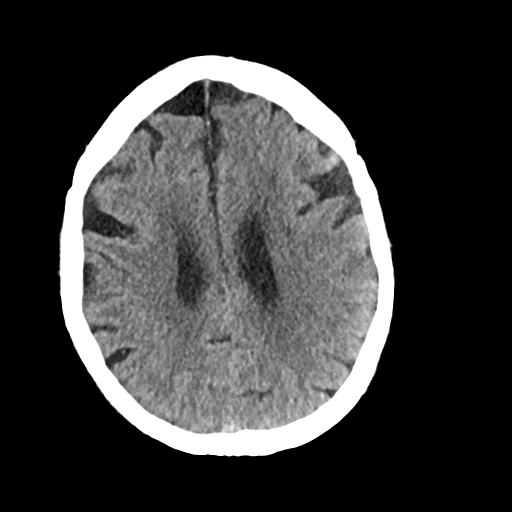
[im 23/32  brain]
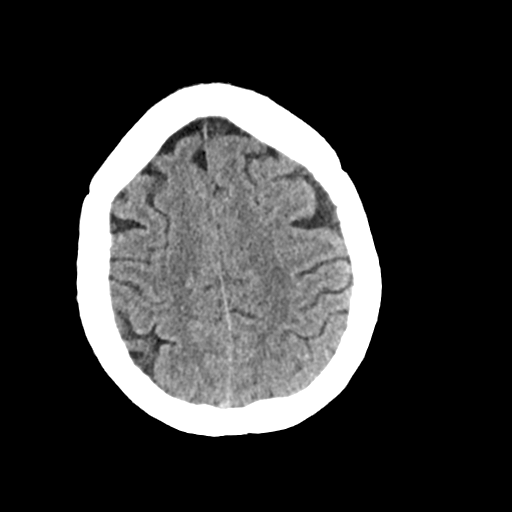
[im 27/32  brain]
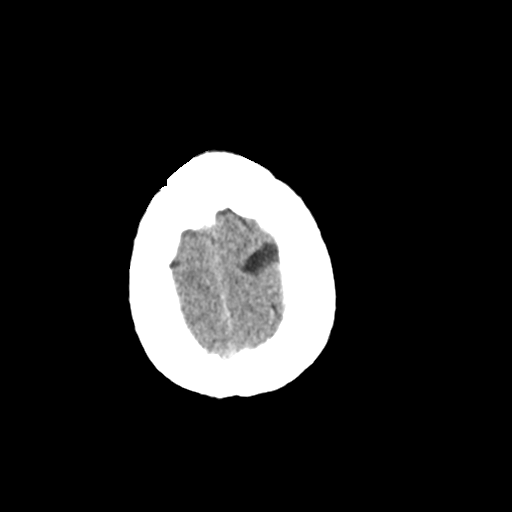
[im 27/32  bone]
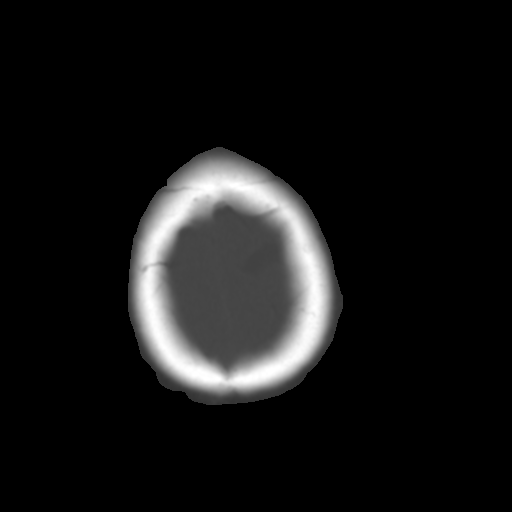
[im 29/32  brain]
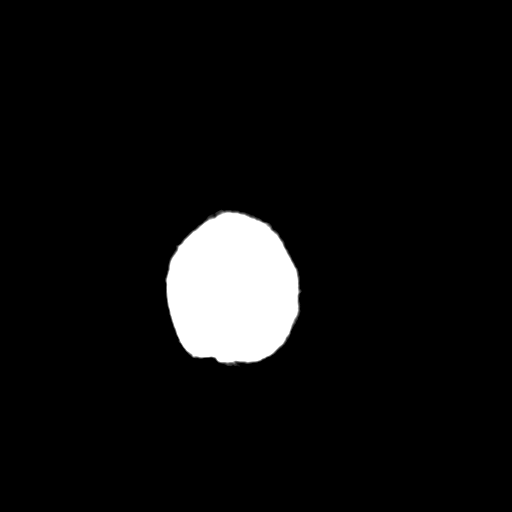

[Series 4: head 3.00 hr40 s3 sag · sagittal · 0.33mm/px · 3 of 57 slices shown]
[im 19/57  brain]
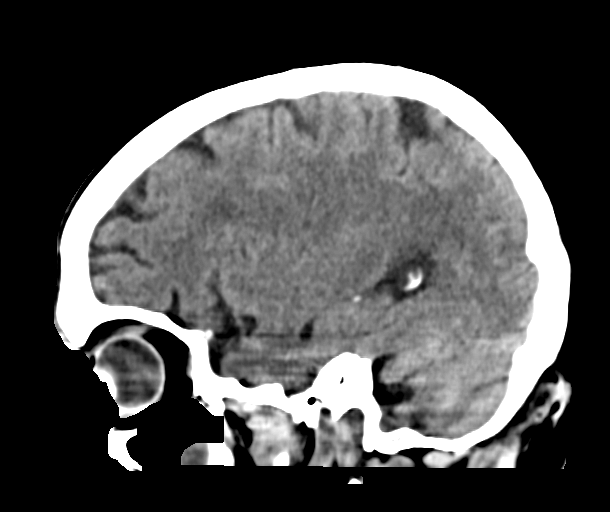
[im 29/57  brain]
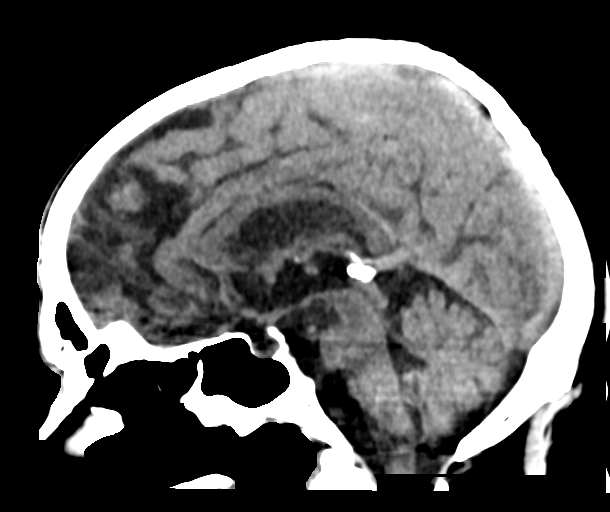
[im 38/57  brain]
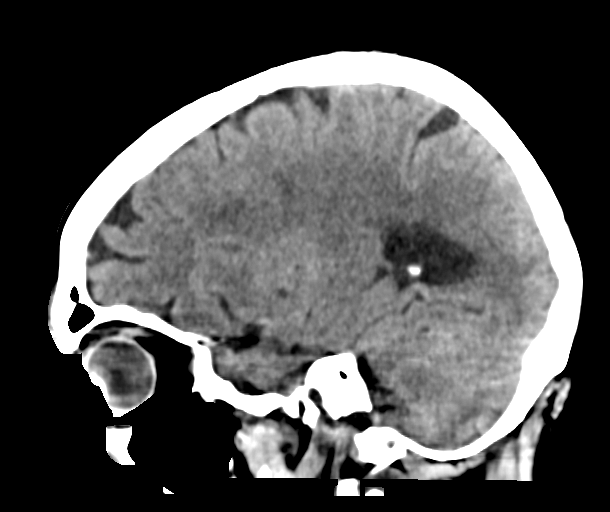

[Series 6: head 3.00 hr40 s3 cor · coronal · 0.33mm/px · 3 of 66 slices shown]
[im 22/66  brain]
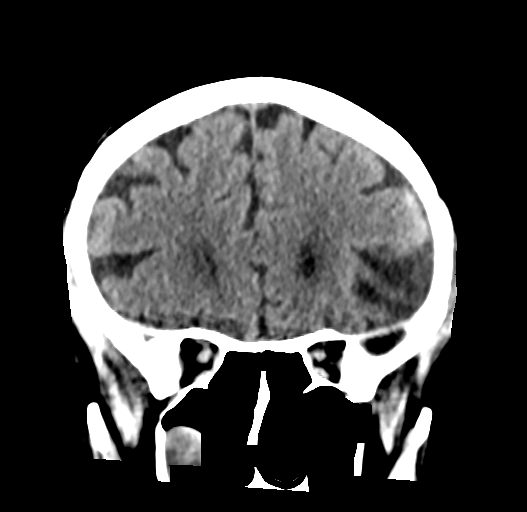
[im 29/66  brain]
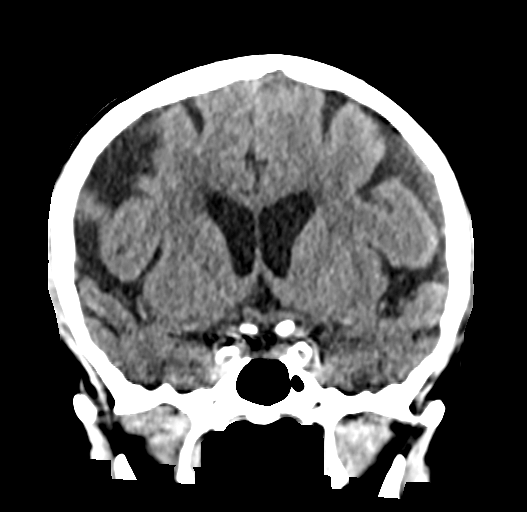
[im 37/66  brain]
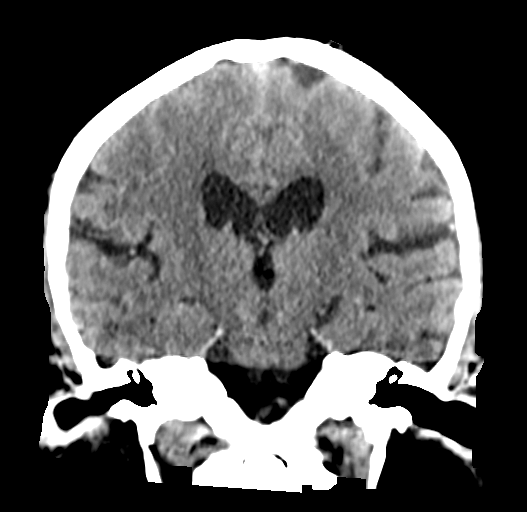

[16 of 47 positions shown; findings below may reference images not displayed]

FINDINGS: Brain: No evidence of acute infarction, hemorrhage, hydrocephalus,
extra-axial collection or mass lesion/mass effect. Atrophy and mild
chronic microvascular ischemic change are noted.

Vascular: Atherosclerotic vascular disease is identified.

Skull: Intact.  No focal lesion.

Sinuses/Orbits: Mucous retention cyst or polyp right maxillary sinus
is seen.

Other: None.
IMPRESSION: No acute abnormality.

Atrophy and mild chronic microvascular ischemic change.

Atherosclerosis.

Mucous retention cyst or polyp right maxillary sinus.

## 2020-04-11 ENCOUNTER — Encounter: Payer: Self-pay | Admitting: Family Medicine

## 2020-04-11 ENCOUNTER — Ambulatory Visit (INDEPENDENT_AMBULATORY_CARE_PROVIDER_SITE_OTHER): Payer: Medicare Other | Admitting: Family Medicine

## 2020-04-11 ENCOUNTER — Other Ambulatory Visit: Payer: Self-pay

## 2020-04-11 VITALS — BP 124/80 | HR 65 | Resp 16 | Ht 69.0 in | Wt 169.1 lb

## 2020-04-11 DIAGNOSIS — Z23 Encounter for immunization: Secondary | ICD-10-CM | POA: Diagnosis not present

## 2020-04-11 DIAGNOSIS — Z1159 Encounter for screening for other viral diseases: Secondary | ICD-10-CM | POA: Diagnosis not present

## 2020-04-11 DIAGNOSIS — I10 Essential (primary) hypertension: Secondary | ICD-10-CM | POA: Diagnosis not present

## 2020-04-11 DIAGNOSIS — R110 Nausea: Secondary | ICD-10-CM

## 2020-04-11 DIAGNOSIS — E782 Mixed hyperlipidemia: Secondary | ICD-10-CM

## 2020-04-11 DIAGNOSIS — Z0000 Encounter for general adult medical examination without abnormal findings: Secondary | ICD-10-CM | POA: Diagnosis not present

## 2020-04-11 DIAGNOSIS — Z794 Long term (current) use of insulin: Secondary | ICD-10-CM

## 2020-04-11 DIAGNOSIS — E1140 Type 2 diabetes mellitus with diabetic neuropathy, unspecified: Secondary | ICD-10-CM | POA: Diagnosis not present

## 2020-04-11 NOTE — Progress Notes (Addendum)
HPI:  [name] is a 74 y.o.male here today with his wife for his routine physical examination.  Last CPE: 04/23/19. He lives with his wife.  Regular exercise 3 or more times per week: Not consistently but hs is active with chores around his house. Following a healthy diet: Yes  Chronic medical problems: DM II,HTN,HLD,CAD s/p stent placement, and GERD among some. He is following with endocrinologist and cardiologist.  Immunization History  Administered Date(s) Administered  . Fluad Quad(high Dose 65+) 04/11/2019, 04/11/2020  . Influenza, High Dose Seasonal PF 04/15/2017, 04/11/2018  . Pneumococcal Polysaccharide-23 04/11/2020  . Tdap 07/12/2012   Hep C screening: Never. Last colon cancer screening: 09/16/15 Last prostate ca screening: 08/18/16 PSA 1.9. No changes in urine frequency. Negative for high alcohol intake. Former smoker. S/p aortic aneurysm repair.  -Concerns and/or follow up today:   Occasional nausea for a while, he takes Zofran prn, prescribed by his cardiologist.  He has not followed with GI.  Nausea is exacerbated by eating greasy food, which he is avoiding. No associated abdominal pain or melena. States that he is taking Prilosec 20 mg and not longer on Protonix.  HTN: He is on Losartan 50 mg daily and Amlodipine 10 mg 1/2 daily.  Lab Results  Component Value Date   CREATININE 1.19 (H) 04/11/2020   BUN 16 04/11/2020   NA 139 04/11/2020   K 4.3 04/11/2020   CL 102 04/11/2020   CO2 25 04/11/2020   HLD: He is on Crestor 10 mg 3 times per week. Tolerating medication well.  Component     Latest Ref Rng & Units 12/25/2018  Cholesterol, Total     100 - 199 mg/dL 149  Triglycerides     <150 mg/dL 269 (H)  HDL Cholesterol     > OR = 40 mg/dL 27 (L)  VLDL Cholesterol Cal     5 - 40 mg/dL 54 (H)  LDL (calc)     0 - 99 mg/dL 68  Total CHOL/HDL Ratio     <5.0 (calc) 5.5 (H)   DM II: Reports HgA1C 6.8 in 02/2020.  Review of Systems    Constitutional: Positive for fatigue. Negative for activity change, appetite change and fever.  HENT: Negative for mouth sores, nosebleeds, sore throat and trouble swallowing.   Eyes: Negative for redness and visual disturbance.  Respiratory: Negative for cough, shortness of breath and wheezing.   Cardiovascular: Negative for chest pain, palpitations and leg swelling.  Gastrointestinal: Positive for nausea. Negative for abdominal pain, blood in stool and vomiting.  Endocrine: Negative for cold intolerance, heat intolerance, polydipsia, polyphagia and polyuria.  Genitourinary: Negative for decreased urine volume, dysuria, genital sores, hematuria and testicular pain.  Musculoskeletal: Positive for arthralgias. Negative for gait problem.  Skin: Negative for color change and rash.  Neurological: Negative for syncope, weakness and headaches.  Hematological: Negative for adenopathy. Does not bruise/bleed easily.  Psychiatric/Behavioral: Negative for confusion and sleep disturbance.  All other systems reviewed and are negative.  Current Outpatient Medications on File Prior to Visit  Medication Sig Dispense Refill  . amLODipine (NORVASC) 10 MG tablet Take 0.5 tablets (5 mg total) by mouth daily. 45 tablet 2  . aspirin 81 MG tablet Take 81 mg by mouth daily.     [name] LANTUS SOLOSTAR 100 UNIT/ML Solostar Pen INJECT SUBCUTANEOUSLY 40  UNITS DAILY 45 mL 3  . losartan (COZAAR) 50 MG tablet TAKE 1 TABLET BY MOUTH  TWICE DAILY 180 tablet 2  .  ondansetron (ZOFRAN) 4 MG tablet Take 1 tablet (4 mg total) by mouth every 8 (eight) hours as needed for nausea or vomiting. 30 tablet 0  . ONETOUCH ULTRA test strip CHECK BLOOD SUGAR 3 TIMES A DAY OR AS DIRECTED 100 strip 0  . rosuvastatin (CRESTOR) 10 MG tablet TAKE 1 TABLET BY MOUTH 3  TIMES A WEEK. 36 tablet 3  . ULTICARE MINI PEN NEEDLES 31G X 6 MM MISC USE AS DIRECTED 100 each 1  . nitroGLYCERIN (NITROSTAT) 0.4 MG SL tablet Place 1 tablet (0.4 mg total) under  the tongue every 5 (five) minutes as needed for chest pain. 25 tablet 1  . pantoprazole (PROTONIX) 40 MG tablet TAKE 1 TABLET BY MOUTH  DAILY 90 tablet 3   No current facility-administered medications on file prior to visit.   Past Medical History:  Diagnosis Date  . Anxiety   . Arthritis    "back" (04/05/2014)  . Atrial fibrillation ([place])   . Blood in stool   . Bradycardia- with decrease of BB some improvement 09/10/2017  . CAD (coronary artery disease)   . 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 Dr Darcey Nora - to LAD, SVG to diagonal, SVG to OM, SVG to PDA ;  b.  [facility]HC (9/15): ostial LAD occl, ostial CFX 60%, prox AVCFX 95%, RCA 95%, S-PDA occl, S-OM/Dx with Dx limb occl and prox 60%, L-LAD patent, EF 35-40% wtih ant-lat and apical HK-AK >> PCI:  BMS to native RCA   . Diverticulosis   . Dizziness   . DM type 2 causing complication ([place]) 01/16/6766  . GERD (gastroesophageal reflux disease)   . Hypertension   . Iliac artery aneurysm ([place])   . Ischemic cardiomyopathy    a. EF 35-40% by LV gram 03/2014  . Joint pain   . Kidney stones   . Kidney stones   . Lower back pain Jan 2015  . Mixed hyperlipidemia 08/16/2013  . Myocardial infarction ([place]) 03/2014 X 2?  . S/P angioplasty with stent DES mid SVG to OM and DES to prox. SVG to OM, DAPT for lifetime 09/10/2017  . Type II diabetes mellitus ([place])     Past Surgical History:  Procedure Laterality Date  . CARDIAC CATHETERIZATION  2006   "before [facility]"  . CORONARY ANGIOPLASTY WITH STENT PLACEMENT  04/05/2014   "1"  . CORONARY ARTERY BYPASS GRAFT  2006   LIMA to LAD,SVG to diagonal,SVG to obtuse marginal,SVG to posterior descending  . CORONARY STENT INTERVENTION N/A 09/09/2017   Procedure: CORONARY STENT INTERVENTION;  Surgeon: Burnell Blanks, MD;  Location: [facility];  Service: Cardiovascular;  Laterality: N/A;  . CORONARY STENT PLACEMENT  09/09/2017  . CYSTOSCOPY W/ STONE MANIPULATION  1970's X 2  . CYSTOSCOPY W/ URETEROSCOPY W/  LITHOTRIPSY  ~ 2009  . ILIAC ARTERY STENT Left Aug. 10, 2011   Aorta to right ext iliac and left CIA stent repair   . LEFT HEART CATH AND CORS/GRAFTS ANGIOGRAPHY N/A 09/08/2017   Procedure: LEFT HEART CATH AND CORS/GRAFTS ANGIOGRAPHY;  Surgeon: [name], MD;  Location: [facility];  Service: Cardiovascular;  Laterality: N/A;  . LEFT HEART CATHETERIZATION WITH CORONARY/GRAFT ANGIOGRAM N/A 04/05/2014   Procedure: LEFT HEART CATHETERIZATION WITH [name];  Surgeon: [name], MD;  Location: [facility];  Service: Cardiovascular;  Laterality: N/A;  . NM MYOCAR PERF WALL MOTION  11/18/2009   low risk  . [place]  Allergies  Allergen Reactions  . Codeine Nausea Only and Other (See Comments)    Cannot take on empty stomach.  . Valium Nausea Only and Other (See Comments)    Cannot take on empty stomach.    Family History  Problem Relation Age of Onset  . Diabetes Mother   . Hypertension Mother   . Heart disease Mother        AAA  . Heart disease Sister   . Heart disease Son        Heart Disease before age 83  . Diabetes Brother   . Thyroid disease Neg Hx     Social History   Socioeconomic History  . Marital status: Married    Spouse name: Not on file  . Number of children: 3  . Years of education: Not on file  . Highest education level: Not on file  Occupational History  . Not on file  Tobacco Use  . Smoking status: Former Smoker    Years: 2.00    Types: Cigars    Quit date: 09/09/2017    Years since quitting: 2.5  . Smokeless tobacco: Never Used  . Tobacco comment: states he smoked cigar   Vaping Use  . Vaping Use: Never used  Substance and Sexual Activity  . Alcohol use: No  . Drug use: No  . Sexual activity: Yes  Other Topics Concern  . Not on file  Social History Narrative   Lives with wife   Caffeine use: Soda sometimes   1/2 cup coffee in the morning sometimes   Right handed    Social  Determinants of Health   Financial Resource Strain:   . Difficulty of Paying Living Expenses: Not on file  Food Insecurity:   . Worried About Charity fundraiser in the Last Year: Not on file  . Ran Out of Food in the Last Year: Not on file  Transportation Needs:   . Lack of Transportation (Medical): Not on file  . Lack of Transportation (Non-Medical): Not on file  Physical Activity:   . Days of Exercise per Week: Not on file  . Minutes of Exercise per Session: Not on file  Stress:   . Feeling of Stress : Not on file  Social Connections:   . Frequency of Communication with Friends and Family: Not on file  . Frequency of Social Gatherings with Friends and Family: Not on file  . Attends Religious Services: Not on file  . Active Member of Clubs or Organizations: Not on file  . Attends Archivist Meetings: Not on file  . Marital Status: Not on file   Vitals:   04/11/20 0922  BP: 124/80  Pulse: 65  Resp: 16  SpO2: 98%   Body mass index is 24.98 kg/m.  Wt Readings from Last 3 Encounters:  04/11/20 169 lb 2 oz (76.7 kg)  07/17/19 180 lb (81.6 kg)  03/12/19 179 lb (81.2 kg)   Physical Exam Vitals and nursing note reviewed.  Constitutional:      General: He is not in acute distress.    Appearance: He is well-developed.  HENT:     Head: Normocephalic and atraumatic.     Right Ear: Tympanic membrane, ear canal and external ear normal.     Left Ear: Tympanic membrane, ear canal and external ear normal.     Mouth/Throat:     Mouth: Mucous membranes are moist.     Dentition: Has dentures.     Pharynx: Oropharynx  is clear.  Eyes:     Conjunctiva/sclera: Conjunctivae normal.     Pupils: Pupils are equal, round, and reactive to light.  Neck:     Thyroid: No thyromegaly.     Trachea: No tracheal deviation.  Cardiovascular:     Rate and Rhythm: Normal rate. Rhythm irregular. Occasional extrasystoles are present.    Pulses:          Dorsalis pedis pulses are 2+ on  the right side and 2+ on the left side.       Posterior tibial pulses are 2+ on the right side and 2+ on the left side.     Heart sounds: No murmur heard.   Pulmonary:     Effort: Pulmonary effort is normal. No respiratory distress.     Breath sounds: Normal breath sounds.  Abdominal:     Palpations: Abdomen is soft. There is no hepatomegaly or mass.     Tenderness: There is no abdominal tenderness.  Musculoskeletal:        General: No tenderness.     Cervical back: Normal range of motion.     Comments: No major deformities appreciated and no signs of synovitis.  Lymphadenopathy:     Cervical: No cervical adenopathy.     Upper Body:     Right upper body: No supraclavicular adenopathy.     Left upper body: No supraclavicular adenopathy.  Skin:    General: Skin is warm.     Findings: No erythema.  Neurological:     Mental Status: He is alert and oriented to person, place, and time.     Cranial Nerves: No cranial nerve deficit.     Sensory: No sensory deficit.     Coordination: Coordination normal.     Gait: Gait normal.     Deep Tendon Reflexes:     Reflex Scores:      Bicep reflexes are 2+ on the right side and 2+ on the left side.      Patellar reflexes are 2+ on the right side and 2+ on the left side. Psychiatric:        Mood and Affect: Mood and affect normal.    ASSESSMENT AND PLAN:  [email or web address] was seen today for annual exam.  Diagnoses and all orders for this visit:  Orders Placed This Encounter  Procedures  . Pneumococcal polysaccharide vaccine 23-valent greater than or equal to 2yo subcutaneous/IM  . Flu Vaccine QUAD High Dose(Fluad)  . COMPLETE METABOLIC PANEL WITH GFR  . Hepatitis C antibody screen  . Lipid panel   Lab Results  Component Value Date   CREATININE 1.19 (H) 04/11/2020   BUN 16 04/11/2020   NA 139 04/11/2020   K 4.3 04/11/2020   CL 102 04/11/2020   CO2 25 04/11/2020   Lab Results  Component Value Date   ALT 13 04/11/2020   AST 32  04/11/2020   ALKPHOS 97 12/25/2018   BILITOT 0.8 04/11/2020   Lab Results  Component Value Date   CHOL 153 04/11/2020   HDL 33 (L) 04/11/2020   LDLCALC 86 04/11/2020   LDLDIRECT 74.0 12/28/2016   TRIG 260 (H) 04/11/2020   CHOLHDL 4.6 04/11/2020    Routine general medical examination at a health care facility We discussed the importance of regular physical activity and healthy diet for prevention of chronic illness and/or complications. Preventive guidelines reviewed. Vaccination updated.  Next CPE in a year.  Encounter for HCV screening test for low risk patient -  Hepatitis C antibody screen  Essential hypertension BP adequately controlled. Continue Losartan 50 mg daily dn Amlodipine 10 mg 1/2 tab daily.  Hyperlipidemia, mixed Continue Crestor 10 mg 3 times per week. LDL goal < 70. Further recommendations according to FLP results.  Type 2 diabetes mellitus with diabetic neuropathy, with long-term current use of insulin ([place]) Well controlled per HgA1C reported. Following with endocrinologist.  Need for influenza vaccination -     Flu Vaccine QUAD High Dose(Fluad)  Need for pneumococcal vaccination -     Pneumococcal polysaccharide vaccine 23-valent greater than or equal to 2yo subcutaneous/IM  Nausea without vomiting Avoid identified food that aggravate problem. ? GERD: Continue Prilosec and GERD precautions.   He wants to continue following annually, which is appropriate, he follows with endocrinologist and cardiologist a few times during the year.  Return in about 1 year (around 04/11/2021) for cpe.   Aleana Fifita G. [name], MD  [facility]. [place] office.  A few things to remember from today's visit:  Routine general medical examination at a health care facility  Encounter for HCV screening test for low risk patient - Plan: Hepatitis C antibody screen, CANCELED: Hepatitis C antibody screen  Essential hypertension  Hyperlipidemia, mixed -  Plan: COMPLETE METABOLIC PANEL WITH GFR, Lipid panel, CANCELED: Lipid panel  If you need refills please call your pharmacy. Do not use My Chart to request refills or for acute issues that need immediate attention.  Please be sure medication list is accurate. If a new problem present, please set up appointment sooner than planned today.  Try to skip Prilosec every other day and if doing well you can take it every 3rd day in 3-4 weeks.Then as needed if no symptoms but if worsening problem you need to take it daily.  A few tips:  -As we age balance is not as good as it was, so there is a higher risks for falls. Please remove small rugs and furniture that is "in your way" and could increase the risk of falls. Stretching exercises may help with fall prevention: Yoga and Tai Chi are some examples. Low impact exercise is better, so you are not very achy the next day.  -Sun screen and avoidance of direct sun light recommended. Caution with dehydration, if working outdoors be sure to drink enough fluids.  - Some medications are not safe as we age, increases the risk of side effects and can potentially interact with other medication you are also taken;  including some of over the counter medications. Be sure to let me know when you start a new medication even if it is a dietary/vitamin supplement.   -Healthy diet low in red meet/animal fat and sugar + regular physical activity is recommended.

## 2020-04-11 NOTE — Patient Instructions (Addendum)
A few things to remember from today's visit:  Routine general medical examination at a health care facility  Encounter for HCV screening test for low risk patient - Plan: Hepatitis C antibody screen, CANCELED: Hepatitis C antibody screen  Essential hypertension  Hyperlipidemia, mixed - Plan: COMPLETE METABOLIC PANEL WITH GFR, Lipid panel, CANCELED: Lipid panel  If you need refills please call your pharmacy. Do not use My Chart to request refills or for acute issues that need immediate attention.  Please be sure medication list is accurate. If a new problem present, please set up appointment sooner than planned today.  Try to skip Prilosec every other day and if doing well you can take it every 3rd day in 3-4 weeks.Then as needed if no symptoms but if worsening problem you need to take it daily.  A few tips:  -As we age balance is not as good as it was, so there is a higher risks for falls. Please remove small rugs and furniture that is "in your way" and could increase the risk of falls. Stretching exercises may help with fall prevention: Yoga and Tai Chi are some examples. Low impact exercise is better, so you are not very achy the next day.  -Sun screen and avoidance of direct sun light recommended. Caution with dehydration, if working outdoors be sure to drink enough fluids.  - Some medications are not safe as we age, increases the risk of side effects and can potentially interact with other medication you are also taken;  including some of over the counter medications. Be sure to let me know when you start a new medication even if it is a dietary/vitamin supplement.   -Healthy diet low in red meet/animal fat and sugar + regular physical activity is recommended.

## 2020-04-13 ENCOUNTER — Encounter: Payer: Self-pay | Admitting: Family Medicine

## 2020-04-14 ENCOUNTER — Other Ambulatory Visit: Payer: Self-pay

## 2020-04-14 LAB — COMPLETE METABOLIC PANEL WITH GFR
AG Ratio: 2 (calc) (ref 1.0–2.5)
ALT: 13 U/L (ref 9–46)
AST: 32 U/L (ref 10–35)
Albumin: 4.6 g/dL (ref 3.6–5.1)
Alkaline phosphatase (APISO): 87 U/L (ref 35–144)
BUN/Creatinine Ratio: 13 (calc) (ref 6–22)
BUN: 16 mg/dL (ref 7–25)
CO2: 25 mmol/L (ref 20–32)
Calcium: 9.6 mg/dL (ref 8.6–10.3)
Chloride: 102 mmol/L (ref 98–110)
Creat: 1.19 mg/dL — ABNORMAL HIGH (ref 0.70–1.18)
GFR, Est African American: 69 mL/min/{1.73_m2} (ref >=60)
GFR, Est Non African American: 60 mL/min/{1.73_m2} (ref >=60)
Globulin: 2.3 g/dL (calc) (ref 1.9–3.7)
Glucose, Bld: 184 mg/dL — ABNORMAL HIGH (ref 65–99)
Potassium: 4.3 mmol/L (ref 3.5–5.3)
Sodium: 139 mmol/L (ref 135–146)
Total Bilirubin: 0.8 mg/dL (ref 0.2–1.2)
Total Protein: 6.9 g/dL (ref 6.1–8.1)

## 2020-04-14 LAB — LIPID PANEL
Cholesterol: 153 mg/dL (ref <200)
HDL: 33 mg/dL — ABNORMAL LOW (ref >=40)
LDL Cholesterol (Calc): 86 mg/dL (calc)
Non-HDL Cholesterol (Calc): 120 mg/dL (calc) (ref <130)
Total CHOL/HDL Ratio: 4.6 (calc) (ref <5.0)
Triglycerides: 260 mg/dL — ABNORMAL HIGH (ref <150)

## 2020-04-14 LAB — HEPATITIS C ANTIBODY
Hepatitis C Ab: NONREACTIVE
SIGNAL TO CUT-OFF: 0.02 (ref <1.00)

## 2020-04-14 MED ORDER — ROSUVASTATIN CALCIUM 10 MG PO TABS: ORAL_TABLET | ORAL | 3 refills | Status: DC

## 2020-04-20 ENCOUNTER — Other Ambulatory Visit: Payer: Self-pay | Admitting: Cardiovascular Disease

## 2020-04-27 ENCOUNTER — Other Ambulatory Visit: Payer: Self-pay | Admitting: Cardiovascular Disease

## 2020-05-06 ENCOUNTER — Other Ambulatory Visit: Payer: Self-pay | Admitting: Cardiovascular Disease

## 2020-06-17 ENCOUNTER — Telehealth: Payer: Self-pay | Admitting: Family Medicine

## 2020-06-17 NOTE — Telephone Encounter (Signed)
Patient wife called and stated that pt has been having some congestion with a cough that is making pt feel nauseous  and wanted to see if the provider would send something in to  [facility], [place] -  [place], [place] [address]  Phone:  [phone] Fax:  [phone] CB is [phone]

## 2020-06-18 ENCOUNTER — Encounter: Payer: Self-pay | Admitting: Family Medicine

## 2020-06-18 ENCOUNTER — Telehealth (INDEPENDENT_AMBULATORY_CARE_PROVIDER_SITE_OTHER): Payer: Medicare Other | Admitting: Family Medicine

## 2020-06-18 VITALS — Ht 69.0 in

## 2020-06-18 DIAGNOSIS — R059 Cough, unspecified: Secondary | ICD-10-CM

## 2020-06-18 DIAGNOSIS — J302 Other seasonal allergic rhinitis: Secondary | ICD-10-CM | POA: Diagnosis not present

## 2020-06-18 DIAGNOSIS — R110 Nausea: Secondary | ICD-10-CM | POA: Diagnosis not present

## 2020-06-18 MED ORDER — ONDANSETRON HCL 4 MG PO TABS: 4.0000 mg | ORAL_TABLET | Freq: Every day | ORAL | 0 refills | Status: DC | PRN

## 2020-06-18 MED ORDER — BENZONATATE 100 MG PO CAPS: 200.0000 mg | ORAL_CAPSULE | Freq: Two times a day (BID) | ORAL | 0 refills | Status: AC | PRN

## 2020-06-18 MED ORDER — IPRATROPIUM BROMIDE 0.06 % NA SOLN: 2.0000 | Freq: Four times a day (QID) | NASAL | 2 refills | Status: DC

## 2020-06-18 NOTE — Telephone Encounter (Signed)
Can you guys see if they can do a telephone visit at 12:30 or 4:30 today? I can add them on.

## 2020-06-18 NOTE — Telephone Encounter (Signed)
Pt has appt today

## 2020-06-18 NOTE — Telephone Encounter (Signed)
Patient contacted and scheduled for a telephone visit with Dr. [name] today a t 4:30 PM

## 2020-06-18 NOTE — Progress Notes (Signed)
Virtual Visit via Telephone Note  I connected with [name] on 06/19/11 at  4:30 PM EST by telephone and verified that I am speaking with the correct person using two identifiers.   I discussed the limitations, risks, security and privacy concerns of performing an evaluation and management service by telephone and the availability of in person appointments. I also discussed with the patient that there may be a patient responsible charge related to this service. The patient expressed understanding and agreed to proceed.  Location patient: home Location provider: work office Participants present for the call: patient, provider Patient did not have a visit in the prior 7 days to address this/these issue(s).  Chief Complaint  Patient presents with  . Cough  . Nausea  . Nasal Congestion    History of Present Illness: [name] is a 74 yo male with hx of DM II, HTN,HLD,GERD,andxiety,and depression c/o productive cough that started a couple of days ago and after working on his yard raking leaves. Exacerbated by dust exposure.  Clear sputum, small amount, mainly in the morning. Denies hemoptysis. Negative for fever,chills,sore throat,CP,wheezing,SOB, or skin rash. + Rhinorrhea and post nasal drainage. Negative for headache,unusual fatigue, anosmia,ageusia,and nasal congestion. COVID 19 vaccination completed.  C/O nausea, which is exacerbated by certain food. This problem has been going on for years. GI evaluation was recommended because persistent problem. Negative for vomiting,abdominal pain,or changes in bowel habits.  Nausea is aggravated by cough and post nasal drainage. He started Claritin yesterday.  States that GI has cancelled his appt. Requesting Rx for Zofran, which helps with nausea. DM II: BS's "good."  GERD on Protonix 40 mg daily.   Observations/Objective: Patient sounds cheerful and well on the phone. I do not appreciate any SOB,wheezing,or stridor. Productive  cough x 1. Speech and thought processing are grossly intact. Patient reported vitals:Ht 5\' 9"  (1.753 m)   BMI 24.98 kg/m   Assessment and Plan: 1. Nausea without vomiting We reviewed possible etiologies. Zofran was filled last by his cardiologist,he was instructed to request refills from his GI. ? GERD,dyspepsia,gastroparesis among some to consider. GI referral placed. Continue Zofran 4 mg daily as needed. Instructed about warning signs.  - ondansetron (ZOFRAN) 4 MG tablet; Take 1 tablet (4 mg total) by mouth daily as needed for nausea or vomiting.  Dispense: 15 tablet; Refill: 0 - Ambulatory referral to Gastroenterology  2. Cough I do not think imaging is needed at this time. Benzonatate and plain Mucinex may help. Monitor for new symptoms.  - benzonatate (TESSALON) 100 MG capsule; Take 2 capsules (200 mg total) by mouth 2 (two) times daily as needed for up to 10 days.  Dispense: 30 capsule; Refill: 0  3. Seasonal allergic rhinitis, unspecified trigger Main recommendation is avoidance of trigger factors. Nasal saline irrigations with saline. Atrovent nasal spray.  - ipratropium (ATROVENT) 0.06 % nasal spray; Place 2 sprays into both nostrils 4 (four) times daily.  Dispense: 15 mL; Refill: 2 Follow Up Instructions:  Return if symptoms worsen or fail to improve.  I did not refer this patient for an OV in the next 24 hours for this/these issue(s).  I discussed the assessment and treatment plan with the patient. [name] was provided an opportunity to ask questions and all were answered. He agreed with the plan and demonstrated an understanding of the instructions.   I provided 13 minutes of non-face-to-face time during this encounter.   Ellee Wawrzyniak [name], MD

## 2020-06-30 ENCOUNTER — Ambulatory Visit: Payer: Medicare Other

## 2020-06-30 ENCOUNTER — Other Ambulatory Visit (HOSPITAL_COMMUNITY): Payer: Medicare Other

## 2020-07-08 ENCOUNTER — Other Ambulatory Visit: Payer: Self-pay

## 2020-07-08 DIAGNOSIS — I723 Aneurysm of iliac artery: Secondary | ICD-10-CM

## 2020-07-22 ENCOUNTER — Other Ambulatory Visit (HOSPITAL_COMMUNITY): Payer: Medicare Other

## 2020-07-22 ENCOUNTER — Ambulatory Visit: Payer: Medicare Other

## 2020-07-23 ENCOUNTER — Other Ambulatory Visit: Payer: Self-pay | Admitting: Family Medicine

## 2020-07-23 ENCOUNTER — Other Ambulatory Visit: Payer: Self-pay | Admitting: Cardiovascular Disease

## 2020-07-24 ENCOUNTER — Telehealth: Payer: Self-pay | Admitting: Cardiovascular Disease

## 2020-07-24 DIAGNOSIS — I25810 Atherosclerosis of coronary artery bypass graft(s) without angina pectoris: Secondary | ICD-10-CM

## 2020-07-24 DIAGNOSIS — E785 Hyperlipidemia, unspecified: Secondary | ICD-10-CM

## 2020-07-24 NOTE — Telephone Encounter (Signed)
[name] is calling wanting to know if Dr. Sallyanne Kuster is wanting [name] to have labs prior to his scheduled appointment. If so she is requesting a call to let her know so she can arrange an appointment for him to receive blood work at his PCP prior.

## 2020-07-24 NOTE — Telephone Encounter (Signed)
Routing to MD.

## 2020-07-24 NOTE — Telephone Encounter (Signed)
The patient has been made aware. Lab orders will be mailed for early April.  

## 2020-07-24 NOTE — Telephone Encounter (Signed)
Same as his wife Louvie, BMET and fasting lipids, closer to the appt date.

## 2020-07-24 NOTE — Addendum Note (Signed)
Addended by: Ricci Barker on: 07/24/2020 11:17 AM   Modules accepted: Orders

## 2020-08-01 ENCOUNTER — Telehealth: Payer: Medicare Other | Admitting: Cardiovascular Disease

## 2020-08-14 DIAGNOSIS — E785 Hyperlipidemia, unspecified: Secondary | ICD-10-CM | POA: Diagnosis not present

## 2020-08-14 DIAGNOSIS — E1165 Type 2 diabetes mellitus with hyperglycemia: Secondary | ICD-10-CM | POA: Diagnosis not present

## 2020-08-14 DIAGNOSIS — E1122 Type 2 diabetes mellitus with diabetic chronic kidney disease: Secondary | ICD-10-CM | POA: Diagnosis not present

## 2020-08-14 DIAGNOSIS — Z6824 Body mass index (BMI) 24.0-24.9, adult: Secondary | ICD-10-CM | POA: Diagnosis not present

## 2020-08-14 DIAGNOSIS — I25810 Atherosclerosis of coronary artery bypass graft(s) without angina pectoris: Secondary | ICD-10-CM | POA: Diagnosis not present

## 2020-08-14 DIAGNOSIS — Z794 Long term (current) use of insulin: Secondary | ICD-10-CM | POA: Diagnosis not present

## 2020-08-14 DIAGNOSIS — I10 Essential (primary) hypertension: Secondary | ICD-10-CM | POA: Diagnosis not present

## 2020-09-19 ENCOUNTER — Telehealth: Payer: Self-pay | Admitting: Family Medicine

## 2020-09-19 NOTE — Progress Notes (Signed)
  Chronic Care Management   Note  09/19/2020 Name: [name] MRN: [id number] DOB: 02/18/1946  [name] is a 75 y.o. year old male who is a primary care patient of [name], [name], MD. I reached out to [name] by phone today in response to a referral sent by [name]'s PCP, [name], [name], MD.   [name] was given information about Chronic Care Management services today including:  1. CCM service includes personalized support from designated clinical staff supervised by his physician, including individualized plan of care and coordination with other care providers 2. 24/7 contact phone numbers for assistance for urgent and routine care needs. 3. Service will only be billed when office clinical staff spend 20 minutes or more in a month to coordinate care. 4. Only one practitioner may furnish and bill the service in a calendar month. 5. The patient may stop CCM services at any time (effective at the end of the month) by phone call to the office staff.   Patient agreed to services and verbal consent obtained.   Follow up plan:   Carley Perdue [employer] Scheduler

## 2020-10-10 NOTE — Progress Notes (Signed)
Subjective:   HIROSHI KRUMMEL is a 75 y.o. male who presents for Medicare Annual/Subsequent preventive examination.  Review of Systems     N/A Cardiac Risk Factors include: advanced age (>74men, >23 women);diabetes mellitus;hypertension;dyslipidemia;male gender     Objective:    Today's Vitals   10/13/20 1318  BP: 140/72  Pulse: 66  Temp: 98 F (36.7 C)  SpO2: 99%  Weight: 169 lb (76.7 kg)   Body mass index is 24.96 kg/m.  Advanced Directives 10/13/2020 01/24/2018 01/18/2018 09/08/2017 09/07/2016 09/05/2015 08/13/2014  Does Patient Have a Medical Advance Directive? No No No Yes No No No  Type of Advance Directive - - - Living will;Healthcare Power of Attorney - - -  Does patient want to make changes to medical advance directive? - - - No - Patient declined - - -  Copy of [place] in Chart? - - - No - copy requested - - -  Would patient like information on creating a medical advance directive? No - Patient declined No - Patient declined - - No - Patient declined - Yes - Educational materials given  Pre-existing out of facility DNR order (yellow form or pink MOST form) - - - - - - -    Current Medications (verified) Outpatient Encounter Medications as of 10/13/2020  Medication Sig  . amLODipine (NORVASC) 10 MG tablet TAKE ONE-HALF TABLET BY  MOUTH DAILY  . aspirin 81 MG tablet Take 81 mg by mouth daily.  [name] ipratropium (ATROVENT) 0.06 % nasal spray Place 2 sprays into both nostrils 4 (four) times daily.  [name] LANTUS SOLOSTAR 100 UNIT/ML Solostar Pen INJECT SUBCUTANEOUSLY 40  UNITS DAILY  . losartan (COZAAR) 50 MG tablet TAKE 1 TABLET BY MOUTH  TWICE DAILY  . ondansetron (ZOFRAN) 4 MG tablet Take 1 tablet (4 mg total) by mouth daily as needed for nausea or vomiting.  Glory Rosebush ULTRA test strip CHECK BLOOD SUGAR 3 TIMES A DAY OR AS DIRECTED  . pantoprazole (PROTONIX) 40 MG tablet TAKE 1 TABLET BY MOUTH  DAILY  . rosuvastatin (CRESTOR) 10 MG tablet TAKE 1 TABLET BY MOUTH  3  TIMES WEEKLY  . ULTICARE MINI PEN NEEDLES 31G X 6 MM MISC USE AS DIRECTED  . nitroGLYCERIN (NITROSTAT) 0.4 MG SL tablet Place 1 tablet (0.4 mg total) under the tongue every 5 (five) minutes as needed for chest pain.   No facility-administered encounter medications on file as of 10/13/2020.    Allergies (verified) Codeine and Valium   History: Past Medical History:  Diagnosis Date  . Anxiety   . Arthritis    "back" (04/05/2014)  . Atrial fibrillation ([place])   . Blood in stool   . Bradycardia- with decrease of BB some improvement 09/10/2017  . CAD (coronary artery disease)   . 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 Dr Darcey Nora - to LAD, SVG to diagonal, SVG to OM, SVG to PDA ;  b.  [facility] (9/15): ostial LAD occl, ostial CFX 60%, prox AVCFX 95%, RCA 95%, S-PDA occl, S-OM/Dx with Dx limb occl and prox 60%, L-LAD patent, EF 35-40% wtih ant-lat and apical HK-AK >> PCI:  BMS to native RCA   . Diverticulosis   . Dizziness   . DM type 2 causing complication ([place]) 02/15/5642  . GERD (gastroesophageal reflux disease)   . Hypertension   . Iliac artery aneurysm ([place])   . Ischemic cardiomyopathy    a. EF 35-40% by LV gram 03/2014  . Joint  pain   . Kidney stones   . Kidney stones   . Lower back pain Jan 2015  . Mixed hyperlipidemia 08/16/2013  . Myocardial infarction ([place]) 03/2014 X 2?  . S/P angioplasty with stent DES mid SVG to OM and DES to prox. SVG to OM, DAPT for lifetime 09/10/2017  . Type II diabetes mellitus ([place])    Past Surgical History:  Procedure Laterality Date  . CARDIAC CATHETERIZATION  2006   "before [facility]"  . CORONARY ANGIOPLASTY WITH STENT PLACEMENT  04/05/2014   "1"  . CORONARY ARTERY BYPASS GRAFT  2006   LIMA to LAD,SVG to diagonal,SVG to obtuse marginal,SVG to posterior descending  . CORONARY STENT INTERVENTION N/A 09/09/2017   Procedure: CORONARY STENT INTERVENTION;  Surgeon: Burnell Blanks, MD;  Location: [facility];  Service: Cardiovascular;  Laterality: N/A;  .  CORONARY STENT PLACEMENT  09/09/2017  . CYSTOSCOPY W/ STONE MANIPULATION  1970's X 2  . CYSTOSCOPY W/ URETEROSCOPY W/ LITHOTRIPSY  ~ 2009  . ILIAC ARTERY STENT Left Aug. 10, 2011   Aorta to right ext iliac and left CIA stent repair   . LEFT HEART CATH AND CORS/GRAFTS ANGIOGRAPHY N/A 09/08/2017   Procedure: LEFT HEART CATH AND CORS/GRAFTS ANGIOGRAPHY;  Surgeon: [name], MD;  Location: [facility];  Service: Cardiovascular;  Laterality: N/A;  . LEFT HEART CATHETERIZATION WITH CORONARY/GRAFT ANGIOGRAM N/A 04/05/2014   Procedure: LEFT HEART CATHETERIZATION WITH [name];  Surgeon: [name], MD;  Location: [facility];  Service: Cardiovascular;  Laterality: N/A;  . NM MYOCAR PERF WALL MOTION  11/18/2009   low risk  . TONSILLECTOMY AND ADENOIDECTOMY  1951   Family History  Problem Relation Age of Onset  . Diabetes Mother   . Hypertension Mother   . Heart disease Mother        AAA  . Heart disease Sister   . Heart disease Son        Heart Disease before age 72  . Diabetes Brother   . Thyroid disease Neg Hx    Social History   Socioeconomic History  . Marital status: Married    Spouse name: Not on file  . Number of children: 3  . Years of education: Not on file  . Highest education level: Not on file  Occupational History  . Not on file  Tobacco Use  . Smoking status: Former Smoker    Years: 2.00    Types: Cigars    Quit date: 09/09/2017    Years since quitting: 3.0  . Smokeless tobacco: Never Used  . Tobacco comment: states he smoked cigar   Vaping Use  . Vaping Use: Never used  Substance and Sexual Activity  . Alcohol use: No  . Drug use: No  . Sexual activity: Yes  Other Topics Concern  . Not on file  Social History Narrative   Lives with wife   Caffeine use: Soda sometimes   1/2 cup coffee in the morning sometimes   Right handed    Social Determinants of Health   Financial Resource Strain: Low Risk   . Difficulty of Paying Living  Expenses: Not hard at all  Food Insecurity: No Food Insecurity  . Worried About Charity fundraiser in the Last Year: Never true  . Ran Out of Food in the Last Year: Never true  Transportation Needs: No Transportation Needs  . Lack of Transportation (Medical): No  . Lack of Transportation (Non-Medical): No  Physical Activity: Insufficiently Active  . Days of Exercise per Week: 3 days  . Minutes of Exercise per Session: 30 min  Stress: No Stress Concern Present  . Feeling of Stress : Not at all  Social Connections: Socially Integrated  . Frequency of Communication with Friends and Family: More than three times a week  . Frequency of Social Gatherings with Friends and Family: More than three times a week  . Attends Religious Services: More than 4 times per year  . Active Member of Clubs or Organizations: Not on file  . Attends Archivist Meetings: 1 to 4 times per year  . Marital Status: Married    Tobacco Counseling Counseling given: Not Answered Comment: states he smoked cigar    Clinical Intake:  Pre-visit preparation completed: No  Pain : No/denies pain     Nutritional Risks: None Diabetes: Yes CBG done?: No Did pt. bring in CBG monitor from home?: No  What is the last grade level you completed in school?: high school  Diabetic?yes Nutrition Risk Assessment:  Has the patient had any N/V/D within the last 2 months?  No  Does the patient have any non-healing wounds?  No  Has the patient had any unintentional weight loss or weight gain?  No   Diabetes:  Is the patient diabetic?  Yes  If diabetic, was a CBG obtained today?  No  Did the patient bring in their glucometer from home?  No  How often do you monitor your CBG's? Once a day FBS 112-120.   Financial Strains and Diabetes Management:  Are you having any financial strains with the device, your supplies or your medication? No .  Does the patient want to be seen by Chronic Care Management for  management of their diabetes?  No  Would the patient like to be referred to a Nutritionist or for Diabetic Management?  No   Diabetic Exams:  Diabetic Eye Exam: Completed patient scheduled 11/12/2020 Diabetic Foot Exam: Overdue, Pt has been advised about the importance in completing this exam. Pt is scheduled for diabetic foot exam on next office visit with Dr. Neita Garnet .   Interpreter Needed?: No  Information entered by :: [place] of Daily Living In your present state of health, do you have any difficulty performing the following activities: 10/13/2020  Hearing? N  Vision? N  Difficulty concentrating or making decisions? N  Walking or climbing stairs? N  Dressing or bathing? N  Doing errands, shopping? N  Preparing Food and eating ? N  Using the Toilet? N  In the past six months, have you accidently leaked urine? N  Do you have problems with loss of bowel control? N  Managing your Medications? N  Managing your Finances? N  Housekeeping or managing your Housekeeping? N  Some recent data might be hidden    Patient Care Team: [name], [name], MD as PCP - General (Family Medicine) Croitoru, Dani Gobble, MD as PCP - Cardiology (Cardiology) Sanda Klein, MD as Attending Physician (Cardiology) Sanda Klein, MD as Consulting Physician (Cardiology) Ralene Bathe, MD as Consulting Physician (Ophthalmology) Viona Gilmore, [facility] as Pharmacist (Pharmacist)  Indicate any recent Medical Services you may have received from other than [facility] providers in the past year (date may be approximate).     Assessment:   This is a routine wellness examination for [place].  Hearing/Vision screen  Hearing Screening   125Hz  250Hz  500Hz  1000Hz  2000Hz  3000Hz  4000Hz  6000Hz  8000Hz   Right ear:  Left ear:           Vision Screening Comments: Annual eye exams Dr.tanner   11/12/2020  Dietary issues and exercise activities discussed: Current Exercise Habits: Home exercise  routine, Type of exercise: walking, Time (Minutes): 30, Frequency (Times/Week): 3, Weekly Exercise (Minutes/Week): 90, Intensity: Mild, Exercise limited by: None identified  Goals    . Exercise 3x per week (30 min per time)    . Patient Stated     To feel better and More normal       Depression Screen PHQ 2/9 Scores 10/13/2020 10/13/2020 10/13/2020 08/08/2018 01/18/2018  PHQ - 2 Score 0 0 0 6 0  PHQ- 9 Score - - - 14 4    Fall Risk Fall Risk  10/13/2020 04/03/2018 01/18/2018  Falls in the past year? 0 Yes No  Number falls in past yr: 0 1 -  Injury with Fall? 0 Yes -    FALL RISK PREVENTION PERTAINING TO THE HOME:  Any stairs in or around the home? No  If so, are there any without handrails? Yes  Home free of loose throw rugs in walkways, pet beds, electrical cords, etc? Yes  Adequate lighting in your home to reduce risk of falls? Yes   ASSISTIVE DEVICES UTILIZED TO PREVENT FALLS:  Life alert? No  Use of a cane, walker or w/c? No  Grab bars in the bathroom? Yes  Shower chair or bench in shower? Yes  Elevated toilet seat or a handicapped toilet? Yes   TIMED UP AND GO:  Was the test performed? Yes .  Length of time to ambulate 10 feet: 6 sec.   Gait steady and fast with assistive device  Cognitive Function: Normal cognitive status assessed by direct observation by this Nurse Health Advisor. No abnormalities found.   MMSE - Mini Mental State Exam 04/03/2018  Orientation to time 5  Orientation to Place 4  Registration 3  Attention/ Calculation 1  Recall 2  Language- name 2 objects 2  Language- repeat 0  Language- follow 3 step command 2  Language- read & follow direction 1  Write a sentence 1  Copy design 0  Total score 21        Immunizations Immunization History  Administered Date(s) Administered  . Fluad Quad(high Dose 65+) 04/11/2019, 04/11/2020  . Influenza, High Dose Seasonal PF 04/15/2017, 04/11/2018  . Moderna Sars-Covid-2 Vaccination 08/23/2019, 09/21/2019,  05/20/2020  . Pneumococcal Polysaccharide-23 04/11/2020  . Tdap 07/12/2012    TDAP status: Up to date  Flu Vaccine status: Up to date  Pneumococcal vaccine status: Up to date  Covid-19 vaccine status: Completed vaccines  Qualifies for Shingles Vaccine? Yes    Zostavax completed No   Shingrix Completed?: No.    Education has been provided regarding the importance of this vaccine. Patient has been advised to call insurance company to determine out of pocket expense if they have not yet received this vaccine. Advised may also receive vaccine at local pharmacy or Health Dept. Verbalized acceptance and understanding.  Screening Tests Health Maintenance  Topic Date Due  . FOOT EXAM  01/11/2019  . HEMOGLOBIN A1C  06/26/2019  . OPHTHALMOLOGY EXAM  02/16/2020  . INFLUENZA VACCINE  02/09/2021  . TETANUS/TDAP  07/12/2022  . COLONOSCOPY (Pts 45-59yrs Insurance coverage will need to be confirmed)  09/15/2025  . COVID-19 Vaccine  Completed  . Hepatitis C Screening  Completed  . PNA vac Low Risk Adult  Completed  . HPV VACCINES  Aged Out  Health Maintenance  Health Maintenance Due  Topic Date Due  . FOOT EXAM  01/11/2019  . HEMOGLOBIN A1C  06/26/2019  . OPHTHALMOLOGY EXAM  02/16/2020    Colorectal cancer screening: Referral to GI placed 10/13/2020. Pt aware the office will call re: appt.  Lung Cancer Screening: (Low Dose CT Chest recommended if Age 50-80 years, 30 pack-year currently smoking OR have quit w/in 15years.) does not qualify.   Lung Cancer Screening Referral: N/A  Additional Screening:  Hepatitis C Screening: does qualify; Completed 04/11/2020  Vision Screening: Recommended annual ophthalmology exams for early detection of glaucoma and other disorders of the eye. Is the patient up to date with their annual eye exam?  Yes  Who is the provider or what is the name of the office in which the patient attends annual eye exams? Dr. Satira Sark If pt is not established with a  provider, would they like to be referred to a provider to establish care? No .   Dental Screening: Recommended annual dental exams for proper oral hygiene  Community Resource Referral / Chronic Care Management: CRR required this visit?  No   CCM required this visit?  No      Plan:     I have personally reviewed and noted the following in the patient's chart:   . Medical and social history . Use of alcohol, tobacco or illicit drugs  . Current medications and supplements . Functional ability and status . Nutritional status . Physical activity . Advanced directives . List of other physicians . Hospitalizations, surgeries, and ER visits in previous 12 months . Vitals . Screenings to include cognitive, depression, and falls . Referrals and appointments  In addition, I have reviewed and discussed with patient certain preventive protocols, quality metrics, and best practice recommendations. A written personalized care plan for preventive services as well as general preventive health recommendations were provided to patient.     [name], LPN   4/0/3474   Nurse Notes: none

## 2020-10-13 ENCOUNTER — Other Ambulatory Visit: Payer: Self-pay

## 2020-10-13 ENCOUNTER — Ambulatory Visit (INDEPENDENT_AMBULATORY_CARE_PROVIDER_SITE_OTHER): Payer: Medicare Other

## 2020-10-13 VITALS — BP 140/72 | HR 66 | Temp 98.0°F | Wt 169.0 lb

## 2020-10-13 DIAGNOSIS — Z0000 Encounter for general adult medical examination without abnormal findings: Secondary | ICD-10-CM | POA: Diagnosis not present

## 2020-10-13 DIAGNOSIS — Z0100 Encounter for examination of eyes and vision without abnormal findings: Secondary | ICD-10-CM

## 2020-10-13 DIAGNOSIS — Z1211 Encounter for screening for malignant neoplasm of colon: Secondary | ICD-10-CM | POA: Diagnosis not present

## 2020-10-13 NOTE — Patient Instructions (Signed)
[name] , Thank you for taking time to come for your Medicare Wellness Visit. I appreciate your ongoing commitment to your health goals. Please review the following plan we discussed and let me know if I can assist you in the future.   Screening recommendations/referrals: Colonoscopy: referral sent to Gastroenterology  Recommended yearly ophthalmology/optometry visit for glaucoma screening and checkup Recommended yearly dental visit for hygiene and checkup  Vaccinations: Influenza vaccine: Due Fall 2022 Pneumococcal vaccine: Completed Series  Tdap vaccine: Due 07/12/2022 Shingles vaccine: Will check with local pharmacy or health department     Advanced directives: Will bring in copies with next office visit   Conditions/risks identified: None   Next appointment: 11/13/2020 @330   With pharmacist   Preventive Care 75 Years and Older, Male Preventive care refers to lifestyle choices and visits with your health care provider that can promote health and wellness. What does preventive care include?  A yearly physical exam. This is also called an annual well check.  Dental exams once or twice a year.  Routine eye exams. Ask your health care provider how often you should have your eyes checked.  Personal lifestyle choices, including:  Daily care of your teeth and gums.  Regular physical activity.  Eating a healthy diet.  Avoiding tobacco and drug use.  Limiting alcohol use.  Practicing safe sex.  Taking low doses of aspirin every day.  Taking vitamin and mineral supplements as recommended by your health care provider. What happens during an annual well check? The services and screenings done by your health care provider during your annual well check will depend on your age, overall health, lifestyle risk factors, and family history of disease. Counseling  Your health care provider may ask you questions about your:  Alcohol use.  Tobacco use.  Drug use.  Emotional  well-being.  Home and relationship well-being.  Sexual activity.  Eating habits.  History of falls.  Memory and ability to understand (cognition).  Work and work Statistician. Screening  You may have the following tests or measurements:  Height, weight, and BMI.  Blood pressure.  Lipid and cholesterol levels. These may be checked every 5 years, or more frequently if you are over 42 years old.  Skin check.  Lung cancer screening. You may have this screening every year starting at age 8 if you have a 30-pack-year history of smoking and currently smoke or have quit within the past 15 years.  Fecal occult blood test (FOBT) of the stool. You may have this test every year starting at age 64.  Flexible sigmoidoscopy or colonoscopy. You may have a sigmoidoscopy every 5 years or a colonoscopy every 10 years starting at age 26.  Prostate cancer screening. Recommendations will vary depending on your family history and other risks.  Hepatitis C blood test.  Hepatitis B blood test.  Sexually transmitted disease (STD) testing.  Diabetes screening. This is done by checking your blood sugar (glucose) after you have not eaten for a while (fasting). You may have this done every 1-3 years.  Abdominal aortic aneurysm (AAA) screening. You may need this if you are a current or former smoker.  Osteoporosis. You may be screened starting at age 46 if you are at high risk. Talk with your health care provider about your test results, treatment options, and if necessary, the need for more tests. Vaccines  Your health care provider may recommend certain vaccines, such as:  Influenza vaccine. This is recommended every year.  Tetanus, diphtheria, and acellular  pertussis (Tdap, Td) vaccine. You may need a Td booster every 10 years.  Zoster vaccine. You may need this after age 19.  Pneumococcal 13-valent conjugate (PCV13) vaccine. One dose is recommended after age 42.  Pneumococcal  polysaccharide (PPSV23) vaccine. One dose is recommended after age 45. Talk to your health care provider about which screenings and vaccines you need and how often you need them. This information is not intended to replace advice given to you by your health care provider. Make sure you discuss any questions you have with your health care provider. Document Released: 07/25/2015 Document Revised: 03/17/2016 Document Reviewed: 04/29/2015 Elsevier Interactive Patient Education  2017 [place] Prevention in the Home Falls can cause injuries. They can happen to people of all ages. There are many things you can do to make your home safe and to help prevent falls. What can I do on the outside of my home?  Regularly fix the edges of walkways and driveways and fix any cracks.  Remove anything that might make you trip as you walk through a door, such as a raised step or threshold.  Trim any bushes or trees on the path to your home.  Use bright outdoor lighting.  Clear any walking paths of anything that might make someone trip, such as rocks or tools.  Regularly check to see if handrails are loose or broken. Make sure that both sides of any steps have handrails.  Any raised decks and porches should have guardrails on the edges.  Have any leaves, snow, or ice cleared regularly.  Use sand or salt on walking paths during winter.  Clean up any spills in your garage right away. This includes oil or grease spills. What can I do in the bathroom?  Use night lights.  Install grab bars by the toilet and in the tub and shower. Do not use towel bars as grab bars.  Use non-skid mats or decals in the tub or shower.  If you need to sit down in the shower, use a plastic, non-slip stool.  Keep the floor dry. Clean up any water that spills on the floor as soon as it happens.  Remove soap buildup in the tub or shower regularly.  Attach bath mats securely with double-sided non-slip rug  tape.  Do not have throw rugs and other things on the floor that can make you trip. What can I do in the bedroom?  Use night lights.  Make sure that you have a light by your bed that is easy to reach.  Do not use any sheets or blankets that are too big for your bed. They should not hang down onto the floor.  Have a firm chair that has side arms. You can use this for support while you get dressed.  Do not have throw rugs and other things on the floor that can make you trip. What can I do in the kitchen?  Clean up any spills right away.  Avoid walking on wet floors.  Keep items that you use a lot in easy-to-reach places.  If you need to reach something above you, use a strong step stool that has a grab bar.  Keep electrical cords out of the way.  Do not use floor polish or wax that makes floors slippery. If you must use wax, use non-skid floor wax.  Do not have throw rugs and other things on the floor that can make you trip. What can I do with my stairs?  Do not leave any items on the stairs.  Make sure that there are handrails on both sides of the stairs and use them. Fix handrails that are broken or loose. Make sure that handrails are as long as the stairways.  Check any carpeting to make sure that it is firmly attached to the stairs. Fix any carpet that is loose or worn.  Avoid having throw rugs at the top or bottom of the stairs. If you do have throw rugs, attach them to the floor with carpet tape.  Make sure that you have a light switch at the top of the stairs and the bottom of the stairs. If you do not have them, ask someone to add them for you. What else can I do to help prevent falls?  Wear shoes that:  Do not have high heels.  Have rubber bottoms.  Are comfortable and fit you well.  Are closed at the toe. Do not wear sandals.  If you use a stepladder:  Make sure that it is fully opened. Do not climb a closed stepladder.  Make sure that both sides of the  stepladder are locked into place.  Ask someone to hold it for you, if possible.  Clearly mark and make sure that you can see:  Any grab bars or handrails.  First and last steps.  Where the edge of each step is.  Use tools that help you move around (mobility aids) if they are needed. These include:  Canes.  Walkers.  Scooters.  Crutches.  Turn on the lights when you go into a dark area. Replace any light bulbs as soon as they burn out.  Set up your furniture so you have a clear path. Avoid moving your furniture around.  If any of your floors are uneven, fix them.  If there are any pets around you, be aware of where they are.  Review your medicines with your doctor. Some medicines can make you feel dizzy. This can increase your chance of falling. Ask your doctor what other things that you can do to help prevent falls. This information is not intended to replace advice given to you by your health care provider. Make sure you discuss any questions you have with your health care provider. Document Released: 04/24/2009 Document Revised: 12/04/2015 Document Reviewed: 08/02/2014 Elsevier Interactive Patient Education  2017 [employer].

## 2020-10-16 DIAGNOSIS — E785 Hyperlipidemia, unspecified: Secondary | ICD-10-CM | POA: Diagnosis not present

## 2020-10-16 DIAGNOSIS — I25810 Atherosclerosis of coronary artery bypass graft(s) without angina pectoris: Secondary | ICD-10-CM | POA: Diagnosis not present

## 2020-10-16 LAB — BASIC METABOLIC PANEL
BUN/Creatinine Ratio: 11 (ref 10–24)
BUN: 14 mg/dL (ref 8–27)
CO2: 24 mmol/L (ref 20–29)
Calcium: 9.4 mg/dL (ref 8.6–10.2)
Chloride: 102 mmol/L (ref 96–106)
Creatinine, Ser: 1.23 mg/dL (ref 0.76–1.27)
Glucose: 129 mg/dL — ABNORMAL HIGH (ref 65–99)
Potassium: 4.1 mmol/L (ref 3.5–5.2)
Sodium: 140 mmol/L (ref 134–144)
eGFR: 62 mL/min/{1.73_m2} (ref >59)

## 2020-10-16 LAB — LIPID PANEL
Chol/HDL Ratio: 5.6 ratio — ABNORMAL HIGH (ref 0.0–5.0)
Cholesterol, Total: 161 mg/dL (ref 100–199)
HDL: 29 mg/dL — ABNORMAL LOW (ref >39)
LDL Chol Calc (NIH): 92 mg/dL (ref 0–99)
Triglycerides: 234 mg/dL — ABNORMAL HIGH (ref 0–149)
VLDL Cholesterol Cal: 40 mg/dL (ref 5–40)

## 2020-10-28 ENCOUNTER — Ambulatory Visit (INDEPENDENT_AMBULATORY_CARE_PROVIDER_SITE_OTHER): Payer: Medicare Other | Admitting: Otolaryngology

## 2020-10-28 ENCOUNTER — Other Ambulatory Visit: Payer: Self-pay

## 2020-10-28 ENCOUNTER — Encounter (INDEPENDENT_AMBULATORY_CARE_PROVIDER_SITE_OTHER): Payer: Self-pay | Admitting: Otolaryngology

## 2020-10-28 VITALS — Temp 97.5°F

## 2020-10-28 DIAGNOSIS — H90A31 Mixed conductive and sensorineural hearing loss, unilateral, right ear with restricted hearing on the contralateral side: Secondary | ICD-10-CM

## 2020-10-28 DIAGNOSIS — H6521 Chronic serous otitis media, right ear: Secondary | ICD-10-CM

## 2020-10-28 DIAGNOSIS — H903 Sensorineural hearing loss, bilateral: Secondary | ICD-10-CM

## 2020-10-28 DIAGNOSIS — H6983 Other specified disorders of Eustachian tube, bilateral: Secondary | ICD-10-CM | POA: Diagnosis not present

## 2020-10-28 DIAGNOSIS — H90A22 Sensorineural hearing loss, unilateral, left ear, with restricted hearing on the contralateral side: Secondary | ICD-10-CM | POA: Diagnosis not present

## 2020-10-28 NOTE — Progress Notes (Signed)
HPI: [name] is a 75 y.o. male who returns today for evaluation of decreased hearing which is generally worse on the right side.  He has had a long history of ear problems and serous otitis media with conductive hearing loss which is always been worse on the right side.  He has had several sets of tubes in the right ear the last time about 7 years ago.  He stated that the stayed in place only a couple months.  He presents today for audiologic testing.[name] He states that in the fall his hearing was much worse in the right ear but has improved over the last couple of months. He had tubes placed in his right ear previously.  The last time was in 2015 where he had a palpable tube placed but this only stayed in place for several months.  Past Medical History:  Diagnosis Date  . Anxiety   . Arthritis    "back" (04/05/2014)  . Atrial fibrillation ([place])   . Blood in stool   . Bradycardia- with decrease of BB some improvement 09/10/2017  . CAD (coronary artery disease)   . 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 Dr Darcey Nora - to LAD, SVG to diagonal, SVG to OM, SVG to PDA ;  b.  [facility] (9/15): ostial LAD occl, ostial CFX 60%, prox AVCFX 95%, RCA 95%, S-PDA occl, S-OM/Dx with Dx limb occl and prox 60%, L-LAD patent, EF 35-40% wtih ant-lat and apical HK-AK >> PCI:  BMS to native RCA   . Diverticulosis   . Dizziness   . DM type 2 causing complication ([place]) 11/13/979  . GERD (gastroesophageal reflux disease)   . Hypertension   . Iliac artery aneurysm ([place])   . Ischemic cardiomyopathy    a. EF 35-40% by LV gram 03/2014  . Joint pain   . Kidney stones   . Kidney stones   . Lower back pain Jan 2015  . Mixed hyperlipidemia 08/16/2013  . Myocardial infarction ([place]) 03/2014 X 2?  . S/P angioplasty with stent DES mid SVG to OM and DES to prox. SVG to OM, DAPT for lifetime 09/10/2017  . Type II diabetes mellitus ([place])    Past Surgical History:  Procedure Laterality Date  . CARDIAC CATHETERIZATION  2006   "before [facility]"   . CORONARY ANGIOPLASTY WITH STENT PLACEMENT  04/05/2014   "1"  . CORONARY ARTERY BYPASS GRAFT  2006   LIMA to LAD,SVG to diagonal,SVG to obtuse marginal,SVG to posterior descending  . CORONARY STENT INTERVENTION N/A 09/09/2017   Procedure: CORONARY STENT INTERVENTION;  Surgeon: Burnell Blanks, MD;  Location: [facility];  Service: Cardiovascular;  Laterality: N/A;  . CORONARY STENT PLACEMENT  09/09/2017  . CYSTOSCOPY W/ STONE MANIPULATION  1970's X 2  . CYSTOSCOPY W/ URETEROSCOPY W/ LITHOTRIPSY  ~ 2009  . ILIAC ARTERY STENT Left Aug. 10, 2011   Aorta to right ext iliac and left CIA stent repair   . LEFT HEART CATH AND CORS/GRAFTS ANGIOGRAPHY N/A 09/08/2017   Procedure: LEFT HEART CATH AND CORS/GRAFTS ANGIOGRAPHY;  Surgeon: [name], MD;  Location: [facility];  Service: Cardiovascular;  Laterality: N/A;  . LEFT HEART CATHETERIZATION WITH CORONARY/GRAFT ANGIOGRAM N/A 04/05/2014   Procedure: LEFT HEART CATHETERIZATION WITH [name];  Surgeon: [name], MD;  Location: [facility];  Service: Cardiovascular;  Laterality: N/A;  . NM MYOCAR PERF WALL MOTION  11/18/2009   low risk  . TONSILLECTOMY AND ADENOIDECTOMY  1951   Social History   Socioeconomic History  . Marital status: Married    Spouse name: Not on file  . Number of children: 3  . Years of education: Not on file  . Highest education level: Not on file  Occupational History  . Not on file  Tobacco Use  . Smoking status: Former Smoker    Years: 2.00    Types: Cigars    Quit date: 09/09/2017    Years since quitting: 3.1  . Smokeless tobacco: Never Used  . Tobacco comment: states he smoked cigar   Vaping Use  . Vaping Use: Never used  Substance and Sexual Activity  . Alcohol use: No  . Drug use: No  . Sexual activity: Yes  Other Topics Concern  . Not on file  Social History Narrative   Lives with wife   Caffeine use: Soda sometimes   1/2 cup coffee in the morning sometimes    Right handed    Social Determinants of Health   Financial Resource Strain: Low Risk   . Difficulty of Paying Living Expenses: Not hard at all  Food Insecurity: No Food Insecurity  . Worried About Charity fundraiser in the Last Year: Never true  . Ran Out of Food in the Last Year: Never true  Transportation Needs: No Transportation Needs  . Lack of Transportation (Medical): No  . Lack of Transportation (Non-Medical): No  Physical Activity: Insufficiently Active  . Days of Exercise per Week: 3 days  . Minutes of Exercise per Session: 30 min  Stress: No Stress Concern Present  . Feeling of Stress : Not at all  Social Connections: Socially Integrated  . Frequency of Communication with Friends and Family: More than three times a week  . Frequency of Social Gatherings with Friends and Family: More than three times a week  . Attends Religious Services: More than 4 times per year  . Active Member of Clubs or Organizations: Not on file  . Attends Archivist Meetings: 1 to 4 times per year  . Marital Status: Married   Family History  Problem Relation Age of Onset  . Diabetes Mother   . Hypertension Mother   . Heart disease Mother        AAA  . Heart disease Sister   . Heart disease Son        Heart Disease before age 32  . Diabetes Brother   . Thyroid disease Neg Hx    Allergies  Allergen Reactions  . Codeine Nausea Only and Other (See Comments)    Cannot take on empty stomach.  . Valium Nausea Only and Other (See Comments)    Cannot take on empty stomach.   Prior to Admission medications   Medication Sig Start Date End Date Taking? Authorizing Provider  amLODipine (NORVASC) 10 MG tablet TAKE ONE-HALF TABLET BY  MOUTH DAILY 07/23/20   Croitoru, Mihai, MD  aspirin 81 MG tablet Take 81 mg by mouth daily.    [provider]  ipratropium (ATROVENT) 0.06 % nasal spray Place 2 sprays into both nostrils 4 (four) times daily. 06/18/20   [name], [name], MD  LANTUS  SOLOSTAR 100 UNIT/ML Solostar Pen INJECT SUBCUTANEOUSLY 40  UNITS DAILY 10/16/18   [name], [name], MD  losartan (COZAAR) 50 MG tablet TAKE 1 TABLET BY MOUTH  TWICE DAILY 01/31/20   Croitoru, Dani Gobble, MD  nitroGLYCERIN (NITROSTAT) 0.4 MG SL tablet Place 1 tablet (0.4 mg total) under the tongue  every 5 (five) minutes as needed for chest pain. 07/17/19 10/15/19  Croitoru, Mihai, MD  ondansetron (ZOFRAN) 4 MG tablet Take 1 tablet (4 mg total) by mouth daily as needed for nausea or vomiting. 06/18/20   [name], [name], MD  [facility] ULTRA test strip CHECK BLOOD SUGAR 3 TIMES A DAY OR AS DIRECTED 05/04/19   [name], [name], MD  pantoprazole (PROTONIX) 40 MG tablet TAKE 1 TABLET BY MOUTH  DAILY 07/23/20   [name], [name], MD  rosuvastatin (CRESTOR) 10 MG tablet TAKE 1 TABLET BY MOUTH 3  TIMES WEEKLY 05/07/20   Croitoru, Dani Gobble, MD  [employer] [facility]I PEN NEEDLES 31G X 6 MM MISC USE AS DIRECTED 02/18/20   [name], [name], MD     Positive ROS: Otherwise negative  All other systems have been reviewed and were otherwise negative with the exception of those mentioned in the HPI and as above.  Physical Exam: Constitutional: Alert, well-appearing, no acute distress Ears: External ears without lesions or tenderness. Ear canals are clear bilaterally.  Both TMs are retracted.  On the right side this is slightly worse with erosion of the long process of the incus.  He also has a right serous otitis media on the right side.  I was able to insufflate some air behind the right TM. Nasal: External nose without lesions. Septum moderate septal deviation with moderate rhinitis.. Clear nasal passages Oral: Lips and gums without lesions. Tongue and palate mucosa without lesions. Posterior oropharynx clear. Neck: No palpable adenopathy or masses Respiratory: Breathing comfortably  Skin: No facial/neck lesions or rash noted.  Audiologic testing today demonstrated mild to moderate hearing loss in the mid and low frequencies in both ears  with downsloping upper frequency sensorineural hearing loss in both ears which was slightly worse on the right side with a mild conductive component on the right side.  SRT's were 45 DB on the right and 25 DB on the left.  Tympanograms were B on the right and C on the left.  Procedures  Assessment: Right serous otitis media with mild conductive hearing loss on the right. 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  Plan: Patient is not really interested in having another tube placed in the right ear.  Discussed with him concerning regular use of nasal steroid spray and prescribed Nasacort 2 sprays each nostril at night.  As well as discussed with him concerning trying to "pop" his ear on a daily basis. He would be a candidate for hearing aids especially on the right side and reviewed this with him. He feels like he is hearing better in the right ear today.  He will follow-up if he notices any worsening of his hearing on the right side and consider possible placement of myringotomy tube if needed.  Would recommend placing a T-tube if needed.   Radene Journey, MD

## 2020-10-31 ENCOUNTER — Ambulatory Visit: Payer: Medicare Other | Admitting: Cardiovascular Disease

## 2020-10-31 ENCOUNTER — Other Ambulatory Visit: Payer: Self-pay

## 2020-10-31 ENCOUNTER — Encounter: Payer: Self-pay | Admitting: Cardiovascular Disease

## 2020-10-31 VITALS — BP 132/76 | HR 73 | Ht 69.0 in | Wt 169.6 lb

## 2020-10-31 DIAGNOSIS — I10 Essential (primary) hypertension: Secondary | ICD-10-CM | POA: Diagnosis not present

## 2020-10-31 DIAGNOSIS — I25810 Atherosclerosis of coronary artery bypass graft(s) without angina pectoris: Secondary | ICD-10-CM

## 2020-10-31 DIAGNOSIS — I739 Peripheral vascular disease, unspecified: Secondary | ICD-10-CM

## 2020-10-31 DIAGNOSIS — I5032 Chronic diastolic (congestive) heart failure: Secondary | ICD-10-CM | POA: Diagnosis not present

## 2020-10-31 DIAGNOSIS — E1140 Type 2 diabetes mellitus with diabetic neuropathy, unspecified: Secondary | ICD-10-CM

## 2020-10-31 DIAGNOSIS — E782 Mixed hyperlipidemia: Secondary | ICD-10-CM | POA: Diagnosis not present

## 2020-10-31 DIAGNOSIS — R4189 Other symptoms and signs involving cognitive functions and awareness: Secondary | ICD-10-CM

## 2020-10-31 DIAGNOSIS — Z794 Long term (current) use of insulin: Secondary | ICD-10-CM | POA: Diagnosis not present

## 2020-10-31 MED ORDER — ROSUVASTATIN CALCIUM 10 MG PO TABS: ORAL_TABLET | ORAL | 11 refills | Status: DC

## 2020-10-31 NOTE — Progress Notes (Signed)
Patient ID: [name], male   DOB: 1946-02-09, 75 y.o.   MRN: [id number]    Cardiology Office Note    Date:  10/31/2020   ID:  [name], [name] October 20, 1945, MRN [id number]  PCP:  [name], [name], MD  Cardiologist:   Scott Klein, MD   No chief complaint on file.   History of Present Illness:  [name] is a 75 y.o. male with a long-standing history of coronary artery disease who returns for follow-up.  Always, he is accompanied by his wife [name].   They recently went to the beach for a fishing trip planned both felt well.  He's generally been feeling quite well. The patient specifically denies any chest pain at rest exertion, dyspnea at rest or with exertion, orthopnea, paroxysmal nocturnal dyspnea, syncope, palpitations, focal neurological deficits, intermittent claudication, lower extremity edema, unexplained weight gain, cough, hemoptysis or wheezing.  He felt very anxious when he was taking Brilinta following his last stent.    Glycemic control is improving.  His typical morning fasting glucose is 130 and his most recent hemoglobin A1c was 7.4%.  In January his endocrinologist increased his rosuvastatin from 10 mg 3 times a week to 4 times a week since his LDL cholesterol was high.  Most recent labs still show a higher than desirable LDL cholesterol at 92.  He also has a low HDL of only 29.  Triglycerides were 234.  He had side effects to multiple newer medications and is now on Lantus, seeing Dr. Jerral Steele.    He last had ultrasound for his abdominal aortic aneurysm/iliac artery aneurysm in mid July 2019. EVAR stent was normally positioned without endoleak and the maximum aortic diameter was only 2.0 cm.  Lower extremities had normal ABIs bilaterally.  He saw Dr. Jannifer Steele to evaluate numerous atypical neurological complaints including memory deficit.  His MRI showed a moderate level of small vessel disease and atrophy which could explain his memory difficulties.  There was much  more pronounced atrophy in the frontal and temporal insular region especially on the left side, raising the possibility that he could have frontotemporal dementia or Lewy body disease.    March 08, 2019 FNA of thyroid nodule showed benign findings.  He underwent four-vessel bypass surgery in 2006 (LIMA-LAD, SVG x 3 to Diagonal, OM and PDA). In 2015 received a bare metal stent to the proximal RCA (4.5 mm vessel). On September 08, 2017 he underwent cardiac catheterization which showed a long segment of severe stenosis in the SVG to OM (chronically occluded circumflex), patent LIMA to occluded LAD proximal RCA stent with 80% stenosis in the distal branch of the RCA.  September 09, 2017 he underwent revascularization of the SVG-OM with 2 drug-eluting stents.  He did not tolerate Brilinta and asked to be switched to clopidogrel. He has had EVAR for AAA amd received a stent in the large right common iliac artery aneurysm and is monitored at [facility] He has insulin requiring diabetes mellitus. He has chronically low HDL cholesterol, around 27 mg/dL.  Past Medical History:  Diagnosis Date  . Anxiety   . Arthritis    "back" (04/05/2014)  . Atrial fibrillation ([place])   . Blood in stool   . Bradycardia- with decrease of BB some improvement 09/10/2017  . CAD (coronary artery disease)   . 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 Dr [name] - to LAD, SVG to diagonal, SVG to OM, SVG to PDA ;  b.  LHC (9/15):  ostial LAD occl, ostial CFX 60%, prox AVCFX 95%, RCA 95%, S-PDA occl, S-OM/Dx with Dx limb occl and prox 60%, L-LAD patent, EF 35-40% wtih ant-lat and apical HK-AK >> PCI:  BMS to native RCA   . Diverticulosis   . Dizziness   . DM type 2 causing complication ([place]) 123456  . GERD (gastroesophageal reflux disease)   . Hypertension   . Iliac artery aneurysm ([place])   . Ischemic cardiomyopathy    a. EF 35-40% by LV gram 03/2014  . Joint pain   . Kidney stones   . Kidney stones   . Lower back pain Jan 2015  . Mixed  hyperlipidemia 08/16/2013  . Myocardial infarction ([place]) 03/2014 X 2?  . S/P angioplasty with stent DES mid SVG to OM and DES to prox. SVG to OM, DAPT for lifetime 09/10/2017  . Type II diabetes mellitus ([place])     Past Surgical History:  Procedure Laterality Date  . CARDIAC CATHETERIZATION  2006   "before OHS"  . CORONARY ANGIOPLASTY WITH STENT PLACEMENT  04/05/2014   "1"  . CORONARY ARTERY BYPASS GRAFT  2006   LIMA to LAD,SVG to diagonal,SVG to obtuse marginal,SVG to posterior descending  . CORONARY STENT INTERVENTION N/A 09/09/2017   Procedure: CORONARY STENT INTERVENTION;  Surgeon: Scott Blanks, MD;  Location: [place] [facility];  Service: Cardiovascular;  Laterality: N/A;  . CORONARY STENT PLACEMENT  09/09/2017  . CYSTOSCOPY W/ STONE MANIPULATION  1970's X 2  . CYSTOSCOPY W/ URETEROSCOPY W/ LITHOTRIPSY  ~ 2009  . ILIAC ARTERY STENT Left Aug. 10, 2011   Aorta to right ext iliac and left CIA stent repair   . LEFT HEART CATH AND CORS/GRAFTS ANGIOGRAPHY N/A 09/08/2017   Procedure: LEFT HEART CATH AND CORS/GRAFTS ANGIOGRAPHY;  Surgeon: [name], MD;  Location: [place] [facility];  Service: Cardiovascular;  Laterality: N/A;  . LEFT HEART CATHETERIZATION WITH CORONARY/GRAFT ANGIOGRAM N/A 04/05/2014   Procedure: LEFT HEART CATHETERIZATION WITH [name];  Surgeon: [name], MD;  Location: [facility];  Service: Cardiovascular;  Laterality: N/A;  . NM MYOCAR PERF WALL MOTION  11/18/2009   low risk  . TONSILLECTOMY AND ADENOIDECTOMY  1951    Outpatient Medications Prior to Visit  Medication Sig Dispense Refill  . amLODipine (NORVASC) 10 MG tablet TAKE ONE-HALF TABLET BY  MOUTH DAILY 45 tablet 3  . aspirin 81 MG tablet Take 81 mg by mouth daily.    [name] ipratropium (ATROVENT) 0.06 % nasal spray Place 2 sprays into both nostrils 4 (four) times daily. 15 mL 2  . LANTUS SOLOSTAR 100 UNIT/ML Solostar Pen INJECT SUBCUTANEOUSLY 40  UNITS DAILY 45 mL 3  . losartan  (COZAAR) 50 MG tablet TAKE 1 TABLET BY MOUTH  TWICE DAILY 180 tablet 2  . ondansetron (ZOFRAN) 4 MG tablet Take 1 tablet (4 mg total) by mouth daily as needed for nausea or vomiting. 15 tablet 0  . ONETOUCH ULTRA test strip CHECK BLOOD SUGAR 3 TIMES A DAY OR AS DIRECTED 100 strip 0  . pantoprazole (PROTONIX) 40 MG tablet TAKE 1 TABLET BY MOUTH  DAILY 90 tablet 3  . ULTICARE MINI PEN NEEDLES 31G X 6 MM MISC USE AS DIRECTED 100 each 1  . rosuvastatin (CRESTOR) 10 MG tablet TAKE 1 TABLET BY MOUTH 3  TIMES WEEKLY (Patient taking differently: TAKE 1 TABLET BY MOUTH 4 TIMES WEEKLY) 39 tablet 3  . nitroGLYCERIN (NITROSTAT) 0.4 MG SL tablet Place 1 tablet (0.4  mg total) under the tongue every 5 (five) minutes as needed for chest pain. 25 tablet 1   No facility-administered medications prior to visit.     Allergies:   Codeine and Valium   Social History   Socioeconomic History  . Marital status: Married    Spouse name: Not on file  . Number of children: 3  . Years of education: Not on file  . Highest education level: Not on file  Occupational History  . Not on file  Tobacco Use  . Smoking status: Former Smoker    Years: 2.00    Types: Cigars    Quit date: 09/09/2017    Years since quitting: 3.1  . Smokeless tobacco: Never Used  . Tobacco comment: states he smoked cigar   Vaping Use  . Vaping Use: Never used  Substance and Sexual Activity  . Alcohol use: No  . Drug use: No  . Sexual activity: Yes  Other Topics Concern  . Not on file  Social History Narrative   Lives with wife   Caffeine use: Soda sometimes   1/2 cup coffee in the morning sometimes   Right handed    Social Determinants of Health   Financial Resource Strain: Low Risk   . Difficulty of Paying Living Expenses: Not hard at all  Food Insecurity: No Food Insecurity  . Worried About Charity fundraiser in the Last Year: Never true  . Ran Out of Food in the Last Year: Never true  Transportation Needs: No Transportation  Needs  . Lack of Transportation (Medical): No  . Lack of Transportation (Non-Medical): No  Physical Activity: Insufficiently Active  . Days of Exercise per Week: 3 days  . Minutes of Exercise per Session: 30 min  Stress: No Stress Concern Present  . Feeling of Stress : Not at all  Social Connections: Socially Integrated  . Frequency of Communication with Friends and Family: More than three times a week  . Frequency of Social Gatherings with Friends and Family: More than three times a week  . Attends Religious Services: More than 4 times per year  . Active Member of Clubs or Organizations: Not on file  . Attends Archivist Meetings: 1 to 4 times per year  . Marital Status: Married     Family History:  The patient's family history includes Diabetes in his brother and mother; Heart disease in his mother, sister, and son; Hypertension in his mother.   ROS:   Please see the history of present illness.    ROS All other systems are reviewed and are negative.   PHYSICAL EXAM:   VS:  BP 132/76   Pulse 73   Ht 5\' 9"  (1.753 m)   Wt 169 lb 9.6 oz (76.9 kg)   SpO2 97%   BMI 25.05 kg/m      General: Alert, oriented x3, no distress Head: no evidence of trauma, PERRL, EOMI, no exophtalmos or lid lag, no myxedema, no xanthelasma; normal ears, nose and oropharynx Neck: normal jugular venous pulsations and no hepatojugular reflux; brisk carotid pulses without delay and no carotid bruits Chest: clear to auscultation, no signs of consolidation by percussion or palpation, normal fremitus, symmetrical and full respiratory excursions Cardiovascular: normal position and quality of the apical impulse, regular rhythm, normal first and second heart sounds, no murmurs, rubs or gallops Abdomen: no tenderness or distention, no masses by palpation, no abnormal pulsatility or arterial bruits, normal bowel sounds, no hepatosplenomegaly Extremities: no clubbing, cyanosis  or edema; 2+ radial, ulnar  and brachial pulses bilaterally; 2+ right femoral, posterior tibial and dorsalis pedis pulses; 2+ left femoral, posterior tibial and dorsalis pedis pulses; no subclavian or femoral bruits Neurological: grossly nonfocal Psych: Normal mood and affect    Wt Readings from Last 3 Encounters:  10/31/20 169 lb 9.6 oz (76.9 kg)  10/13/20 169 lb (76.7 kg)  04/11/20 169 lb 2 oz (76.7 kg)      Studies/Labs Reviewed:   EKG:  EKG is ordered today.  Shows normal sinus rhythm with a single PAC, equivocal Q waves in the inferior leads, no acute repolarization abnormalities, QTC 473 ms. Recent Labs: BMET    Component Value Date/Time   NA 140 10/16/2020 0837   K 4.1 10/16/2020 0837   CL 102 10/16/2020 0837   CO2 24 10/16/2020 0837   GLUCOSE 129 (H) 10/16/2020 0837   GLUCOSE 184 (H) 04/11/2020 1049   BUN 14 10/16/2020 0837   CREATININE 1.23 10/16/2020 0837   CREATININE 1.19 (H) 04/11/2020 1049   CALCIUM 9.4 10/16/2020 0837   GFRNONAA 60 04/11/2020 1049   GFRAA 69 04/11/2020 1049     Lipid Panel     Component Value Date/Time   CHOL 161 10/16/2020 0837   TRIG 234 (H) 10/16/2020 0837   HDL 29 (L) 10/16/2020 0837   CHOLHDL 5.6 (H) 10/16/2020 0837   CHOLHDL 4.6 04/11/2020 1049   VLDL 53.0 (H) 12/28/2016 0936   LDLCALC 92 10/16/2020 0837   LDLCALC 86 04/11/2020 1049   LDLDIRECT 74.0 12/28/2016 0936   CBC:    Component Value Date/Time   WBC 8.8 03/21/2018 1202   HGB 15.3 03/21/2018 1202   HGB 14.4 11/28/2017 1150   HCT 44.4 03/21/2018 1202   HCT 42.3 11/28/2017 1150   PLT 267.0 03/21/2018 1202   PLT 161 11/28/2017 1150   MCV 84.0 03/21/2018 1202   MCV 88 11/28/2017 1150   NEUTROABS 11.0 (H) 02/02/2014 0009   LYMPHSABS 0.7 02/02/2014 0009   MONOABS 0.8 02/02/2014 0009   EOSABS 0.0 02/02/2014 0009   BASOSABS 0.0 02/02/2014 0009    BMET    Component Value Date/Time   NA 140 10/16/2020 0837   K 4.1 10/16/2020 0837   CL 102 10/16/2020 0837   CO2 24 10/16/2020 0837   GLUCOSE  129 (H) 10/16/2020 0837   GLUCOSE 184 (H) 04/11/2020 1049   BUN 14 10/16/2020 0837   CREATININE 1.23 10/16/2020 0837   CREATININE 1.19 (H) 04/11/2020 1049   CALCIUM 9.4 10/16/2020 0837   GFRNONAA 60 04/11/2020 1049   GFRAA 69 04/11/2020 1049     ASSESSMENT:    1. 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   2. Chronic diastolic heart failure ([place])   3. Essential hypertension   4. PAD (peripheral artery disease) ([place])   5. Hyperlipidemia, mixed   6. Type 2 diabetes mellitus with diabetic neuropathy, with long-term current use of insulin ([place])   7. Cognitive deficits      PLAN:  In order of problems listed above:  1. CAD: Not have angina pectoris.  Remains asymptomatic since his last stent procedure (DES x2 to SVG-OM) in March 2019. 2. CHF: Has had transient reduction in left ventricular systolic function related to ischemia, last echo showed normal ventricular function.  Currently asymptomatic, NYHA functional class I and clinically euvolemic without diuretics. 3. HTN: Well-controlled. 4. PAD: He does not have complaints of claudication.  History of EVAR stent graft for  aorto-iliac aneurysm in 2011, being followed in the vascular surgery clinic.  Favorable findings on last scan in July 2019.  5. HLP: Very low HDL cholesterol, LDL not at target.  Increase rosuvastatin to 6 days a week.  Would expect triglycerides to improve with improving glycemic control. 6. DM: control has improved, but remains suboptimal.  Did not tolerate SGLT2 inhibitors ([place]). 7. Cognitive issues: He does have some small vessel disease and a peculiar pattern of atrophy involving the frontotemporal region, but no specific diagnosis is firmly established.  Symptomatically he seems improved, although he still has some agitation.  Brilinta did seem to make this anxiety worse.    Medication Adjustments/Labs and Tests Ordered: Current medicines are reviewed  at length with the patient today.  Concerns regarding medicines are outlined above.  Medication changes, Labs and Tests ordered today are listed in the Patient Instructions below. Patient Instructions  Medication Instructions:  Begin taking rosuvastatin 10mg  once daily six times per week.    *If you need a refill on your cardiac medications before your next appointment, please call your pharmacy*   Lab Work: None ordered  If you have labs (blood work) drawn today and your tests are completely normal, you will receive your results only by: [name] MyChart Message (if you have MyChart) OR . A paper copy in the mail If you have any lab test that is abnormal or we need to change your treatment, we will call you to review the results.   Testing/Procedures: None ordered   Follow-Up: At [facility], you and your health needs are our priority.  As part of our continuing mission to provide you with exceptional heart care, we have created designated Provider Care Teams.  These Care Teams include your primary Cardiologist (physician) and Advanced Practice Providers (APPs -  Physician Assistants and Nurse Practitioners) who all work together to provide you with the care you need, when you need it.  We recommend signing up for the patient portal called "MyChart".  Sign up information is provided on this After Visit Summary.  MyChart is used to connect with patients for Virtual Visits (Telemedicine).  Patients are able to view lab/test results, encounter notes, upcoming appointments, etc.  Non-urgent messages can be sent to your provider as well.   To learn more about what you can do with MyChart, go to [email or web address].    Your next appointment:   12 month(s)  The format for your next appointment:   In Person  Provider:   Sanda Klein, MD          Signed, Scott Klein, MD  10/31/2020 2:45 PM    [facility] [address], [place], [place]   [address] Phone: [phone]; Fax: [phone]

## 2020-10-31 NOTE — Patient Instructions (Signed)
Medication Instructions:  Begin taking rosuvastatin 10mg  once daily six times per week.    *If you need a refill on your cardiac medications before your next appointment, please call your pharmacy*   Lab Work: None ordered  If you have labs (blood work) drawn today and your tests are completely normal, you will receive your results only by: [name] MyChart Message (if you have MyChart) OR . A paper copy in the mail If you have any lab test that is abnormal or we need to change your treatment, we will call you to review the results.   Testing/Procedures: None ordered   Follow-Up: At [facility], you and your health needs are our priority.  As part of our continuing mission to provide you with exceptional heart care, we have created designated Provider Care Teams.  These Care Teams include your primary Cardiologist (physician) and Advanced Practice Providers (APPs -  Physician Assistants and Nurse Practitioners) who all work together to provide you with the care you need, when you need it.  We recommend signing up for the patient portal called "MyChart".  Sign up information is provided on this After Visit Summary.  MyChart is used to connect with patients for Virtual Visits (Telemedicine).  Patients are able to view lab/test results, encounter notes, upcoming appointments, etc.  Non-urgent messages can be sent to your provider as well.   To learn more about what you can do with MyChart, go to [email or web address].    Your next appointment:   12 month(s)  The format for your next appointment:   In Person  Provider:   Sanda Klein, MD

## 2020-11-10 DIAGNOSIS — H524 Presbyopia: Secondary | ICD-10-CM | POA: Diagnosis not present

## 2020-11-10 DIAGNOSIS — H43813 Vitreous degeneration, bilateral: Secondary | ICD-10-CM | POA: Diagnosis not present

## 2020-11-10 DIAGNOSIS — H2513 Age-related nuclear cataract, bilateral: Secondary | ICD-10-CM | POA: Diagnosis not present

## 2020-11-10 DIAGNOSIS — E119 Type 2 diabetes mellitus without complications: Secondary | ICD-10-CM | POA: Diagnosis not present

## 2020-11-10 LAB — HM DIABETES EYE EXAM

## 2020-11-11 ENCOUNTER — Encounter (INDEPENDENT_AMBULATORY_CARE_PROVIDER_SITE_OTHER): Payer: Self-pay

## 2020-11-11 ENCOUNTER — Encounter: Payer: Self-pay | Admitting: Family Medicine

## 2020-11-12 ENCOUNTER — Telehealth: Payer: Self-pay | Admitting: Pharmacist

## 2020-11-12 NOTE — Chronic Care Management (AMB) (Signed)
Chronic Care Management Pharmacy Assistant   Name: [name]  MRN: [id number] DOB: 28-May-1946  Reason for Encounter: Chart Prep for CPP Visit on 11/13/2020   Conditions to be addressed/monitored: CAD, HTN, HLD, DMII and GERD  Primary concerns for visit include: Medications cost and DM 2 and Hypertension  Recent office visits:  . 04.04.2022 [name], LPN Medicare Wellness Exam . 12.08.2021 [name], [name], MD (PCP) Video Visit-Nausea without vomiting o Referral place to gastroenterology o Medications prescribed and changed o Benzonatate 200 mg Oral 2 times daily PRN o Ipratropium Bromide 0.06 % 2 sprays Each Nare 4 times daily o Ondansetron HCl 4 mg Oral Daily PRN from 4 mg Oral Every 8 hours PRN  Recent consult visits:  . 04.22.2022 Croitoru, Mihai, MD Cardiology-coronary artery disease involving autologous vein coronary bypass graft without angina pectoris o Medication Changed o Rosuvastatin Calcium 10 mg take 1 tablet by mouth 6 times a week from take one tablet by mouth 3 times weekly.  . 04.19.2022 [name], MD Otolaryngology -Sensorineural hearing loss (SNHL) of both ears . 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 Have you seen any other providers since your last visit?  a. Cardiologist b. ophthalmologist 2. Any changes in your medications or health? No 3. Any side effects from any medications? No 4. Any concerns about your health right now? No 5. Has your provider asked that you check blood pressure, blood sugar, or follow special diet at home?  a. he takes his blood sugars daily 6. Do you get any type of exercise on a regular basis?  a. Walking -daily 7. Can you think of a goal you would like to reach for your health? No 8. Do you have any problems getting your  medications?  a. He would like some help with the cost of getting his insulin 9. Is there anything that you would like to discuss during the appointment? No  The patient was asked to please bring medications, blood pressure/ blood sugar log, and supplements to his appointment.  Medication Dispensed  Quantity  Rosuvastatin (CRESTOR) 10 MG tablet; daily 04.07.2022 39  Amlodipine (NORVASC) 10 MG tablet: 5 mg daily 11.26.2021 45  pantoprazole (PROTONIX) 40 MG tablet: daily 04.07.2022 90  Ipratropium (ATROVENT) 0.06 % nasal spray: two sprays in both nostrils four times daily 12.08.2021 15 mL  Ondansetron (ZOFRAN) 4 MG tablet: one tablet daily as needed 12.08.2021 15  ULTICARE MINI PEN NEEDLES 31G X 6 MM MISC    Losartan (COZAAR) 50 mg tablet 10.18.2021 180  Nitroglycerin (NITROSTAT) 0.4 MG SL tablet: one tablet under tongue every five minutes as needed for chest pain 01.05.2021 50  LANTUS SOLOSTAR 100 UNIT/ML Solostar Pen: inject 40 units daily 04.05.2022 42mL  Aspirin 81 mg: one tablet daily     Medications: Outpatient Encounter Medications as of 11/12/2020  Medication Sig Note  . amLODipine (NORVASC) 10 MG tablet TAKE ONE-HALF TABLET BY  MOUTH DAILY   . aspirin 81 MG tablet Take 81 mg by mouth daily. 09/08/2017: Took 4 - 81 mg tablets today   . ipratropium (ATROVENT) 0.06 % nasal spray Place 2 sprays into both nostrils 4 (four) times daily.   [name] LANTUS SOLOSTAR 100 UNIT/ML Solostar Pen INJECT SUBCUTANEOUSLY 40  UNITS DAILY   . losartan (COZAAR) 50 MG tablet TAKE 1  TABLET BY MOUTH  TWICE DAILY   . nitroGLYCERIN (NITROSTAT) 0.4 MG SL tablet Place 1 tablet (0.4 mg total) under the tongue every 5 (five) minutes as needed for chest pain.   [name] ondansetron (ZOFRAN) 4 MG tablet Take 1 tablet (4 mg total) by mouth daily as needed for nausea or vomiting.   Glory Rosebush ULTRA test strip CHECK BLOOD SUGAR 3 TIMES A DAY OR AS DIRECTED   . pantoprazole (PROTONIX) 40 MG tablet TAKE 1 TABLET BY MOUTH  DAILY   .  rosuvastatin (CRESTOR) 10 MG tablet TAKE 1 TABLET BY MOUTH 6  TIMES A WEEK.   [name] ULTICARE MINI PEN NEEDLES 31G X 6 MM MISC USE AS DIRECTED    No facility-administered encounter medications on file as of 11/12/2020.   I spoke with the patient's wife about his upcoming appointment on 11/13/2020 @ 3:30 pm with the clinical pharmacist. He was asked to please have all medication on hand to review with the pharmacist. She  confirmed his appointment.  Maia Breslow, [place] [phone]   Star Rating Drugs:  Dispensed Quantity Pharmacy  Rosuvastatin 10 mg 04.11.2022 [id number] Revonda Standard, [place] [phone]

## 2020-11-13 ENCOUNTER — Ambulatory Visit (INDEPENDENT_AMBULATORY_CARE_PROVIDER_SITE_OTHER): Payer: Medicare Other | Admitting: Pharmacist

## 2020-11-13 DIAGNOSIS — E782 Mixed hyperlipidemia: Secondary | ICD-10-CM

## 2020-11-13 DIAGNOSIS — E1140 Type 2 diabetes mellitus with diabetic neuropathy, unspecified: Secondary | ICD-10-CM | POA: Diagnosis not present

## 2020-11-13 DIAGNOSIS — Z794 Long term (current) use of insulin: Secondary | ICD-10-CM | POA: Diagnosis not present

## 2020-11-13 DIAGNOSIS — I10 Essential (primary) hypertension: Secondary | ICD-10-CM | POA: Diagnosis not present

## 2020-11-13 NOTE — Progress Notes (Signed)
Chronic Care Management Pharmacy Note  11/28/2020 Name:  [name] MRN:  [id number] DOB:  04/22/1946  Subjective: [name] is an 75 y.o. year old male who is a primary patient of [name], [name], MD.  The CCM team was consulted for assistance with disease management and care coordination needs.    Engaged with patient by telephone for initial visit in response to provider referral for pharmacy case management and/or care coordination services.   Consent to Services:  The patient was given the following information about Chronic Care Management services today, agreed to services, and gave verbal consent: 1. CCM service includes personalized support from designated clinical staff supervised by the primary care provider, including individualized plan of care and coordination with other care providers 2. 24/7 contact phone numbers for assistance for urgent and routine care needs. 3. Service will only be billed when office clinical staff spend 20 minutes or more in a month to coordinate care. 4. Only one practitioner may furnish and bill the service in a calendar month. 5.The patient may stop CCM services at any time (effective at the end of the month) by phone call to the office staff. 6. The patient will be responsible for cost sharing (co-pay) of up to 20% of the service fee (after annual deductible is met). Patient agreed to services and consent obtained.  Patient Care Team: [name], [name], MD as PCP - General (Family Medicine) Croitoru, Dani Gobble, MD as PCP - Cardiology (Cardiology) Sanda Klein, MD as Attending Physician (Cardiology) Sanda Klein, MD as Consulting Physician (Cardiology) Ralene Bathe, MD as Consulting Physician (Ophthalmology) Viona Gilmore, [facility] as Pharmacist (Pharmacist)  Recent office visits:  04.04.2022 [name], LPN: Patient presented for Medicare Wellness Exam.   12.08.2021 [name], [name], MD (PCP): Patient presented for video visit for  nausea without vomiting. Placed referral to gastroenterology. Prescribed benzonatate 200 mg Oral 2 times daily as needed and ipratropium bromide 0.06 % 2 sprays Each Nare 4 times daily. Changed ondansetron to 4 mg daily PRN.  Recent consult visits:  04.22.2022 Sanda Klein, MD Cardiology: Patient presented for CAD follow up. Increased rosuvastatin 10 mg to 6 times a week.    04.19.2022 [name], MD Otolaryngology: Patient presented for sensorineural hearing loss (SNHL) of both ears.   02.03.2022 [name] Endocrinology: Patient presented for DM follow up.   Hospital visits: None in previous 6 months  Objective:  Lab Results  Component Value Date   CREATININE 1.23 10/16/2020   BUN 14 10/16/2020   GFR 64.41 07/03/2018   GFRNONAA 60 04/11/2020   GFRAA 69 04/11/2020   NA 140 10/16/2020   K 4.1 10/16/2020   CALCIUM 9.4 10/16/2020   CO2 24 10/16/2020   GLUCOSE 129 (H) 10/16/2020    Lab Results  Component Value Date/Time   HGBA1C 8.6 (H) 12/25/2018 08:37 AM   HGBA1C 7.5 (A) 07/03/2018 03:58 PM   HGBA1C 6.6 (A) 01/10/2018 02:59 PM   HGBA1C 6.3 06/10/2017 12:39 PM   HGBA1C 6.6 06/15/2016 12:00 AM   FRUCTOSAMINE 236 12/07/2016 11:37 AM   GFR 64.41 07/03/2018 04:37 PM   GFR 53.81 (L) 03/21/2018 12:02 PM   MICROALBUR 2.2 (H) 01/10/2018 03:09 PM   MICROALBUR 1.6 08/16/2016 03:20 PM    Last diabetic Eye exam:  Lab Results  Component Value Date/Time   HMDIABEYEEXA No Retinopathy 11/10/2020 12:00 AM    Last diabetic Foot exam: No results found for: HMDIABFOOTEX   Lab Results  Component Value  Date   CHOL 161 10/16/2020   HDL 29 (L) 10/16/2020   LDLCALC 92 10/16/2020   LDLDIRECT 74.0 12/28/2016   TRIG 234 (H) 10/16/2020   CHOLHDL 5.6 (H) 10/16/2020    Hepatic Function Latest Ref Rng & Units 04/11/2020 12/25/2018 06/15/2016  Total Protein 6.1 - 8.1 g/dL 6.9 7.0 -  Albumin 3.7 - 4.7 g/dL - 4.5 -  AST 10 - 35 U/L 32 48(H) 34  ALT 9 - 46 U/L 13 19 12    Alk Phosphatase 39 - 117 IU/L - 97 75  Total Bilirubin 0.2 - 1.2 mg/dL 0.8 0.8 -    Lab Results  Component Value Date/Time   TSH 3.12 08/08/2018 04:40 PM   TSH 4.510 (H) 12/20/2017 09:37 AM   FREET4 1.35 12/20/2017 09:37 AM    CBC Latest Ref Rng & Units 03/21/2018 11/28/2017 09/27/2017  WBC 4.0 - 10.5 K/uL 8.8 7.1 8.5  Hemoglobin 13.0 - 17.0 g/dL 15.3 14.4 14.6  Hematocrit 39.0 - 52.0 % 44.4 42.3 40.9  Platelets 150.0 - 400.0 K/uL 267.0 161 189    No results found for: VD25OH  Clinical ASCVD: Yes  The 10-year ASCVD risk score Mikey Bussing DC Jr., et al., 2013) is: 51.2%   Values used to calculate the score:     Age: 89 years     Sex: Male     Is Non-Hispanic African American: No     Diabetic: Yes     Tobacco smoker: No     Systolic Blood Pressure: 967 mmHg     Is BP treated: Yes     HDL Cholesterol: 29 mg/dL     Total Cholesterol: 161 mg/dL    Depression screen [facility] 2/9 10/13/2020 10/13/2020 10/13/2020  Decreased Interest 0 0 0  Down, Depressed, Hopeless 0 0 0  PHQ - 2 Score 0 0 0  Altered sleeping - - -  Tired, decreased energy - - -  Change in appetite - - -  Feeling bad or failure about yourself  - - -  Trouble concentrating - - -  Moving slowly or fidgety/restless - - -  Suicidal thoughts - - -  PHQ-9 Score - - -  Difficult doing work/chores - - -      Social History   Tobacco Use  Smoking Status Former Smoker  . Years: 2.00  . Types: Cigars  . Quit date: 09/09/2017  . Years since quitting: 3.2  Smokeless Tobacco Never Used  Tobacco Comment   states he smoked cigar    BP Readings from Last 3 Encounters:  10/31/20 132/76  10/13/20 140/72  04/11/20 124/80   Pulse Readings from Last 3 Encounters:  10/31/20 73  10/13/20 66  04/11/20 65   Wt Readings from Last 3 Encounters:  10/31/20 169 lb 9.6 oz (76.9 kg)  10/13/20 169 lb (76.7 kg)  04/11/20 169 lb 2 oz (76.7 kg)   BMI Readings from Last 3 Encounters:  10/31/20 25.05 kg/m  10/13/20 24.96 kg/m  06/18/20  24.98 kg/m    Assessment/Interventions: Review of patient past medical history, allergies, medications, health status, including review of consultants reports, laboratory and other test data, was performed as part of comprehensive evaluation and provision of chronic care management services.   SDOH:  (Social Determinants of Health) assessments and interventions performed: Yes SDOH Interventions   Flowsheet Row Most Recent Value  SDOH Interventions   Financial Strain Interventions Other (Comment)  [working on patient assistance]  Transportation Interventions Intervention Not Indicated  SDOH Screenings   Alcohol Screen: Low Risk   . Last Alcohol Screening Score (AUDIT): 0  Depression (PHQ2-9): Low Risk   . PHQ-2 Score: 0  Financial Resource Strain: Medium Risk  . Difficulty of Paying Living Expenses: Somewhat hard  Food Insecurity: No Food Insecurity  . Worried About Charity fundraiser in the Last Year: Never true  . Ran Out of Food in the Last Year: Never true  Housing: Low Risk   . Last Housing Risk Score: 0  Physical Activity: Insufficiently Active  . Days of Exercise per Week: 3 days  . Minutes of Exercise per Session: 30 min  Social Connections: Socially Integrated  . Frequency of Communication with Friends and Family: More than three times a week  . Frequency of Social Gatherings with Friends and Family: More than three times a week  . Attends Religious Services: More than 4 times per year  . Active Member of Clubs or Organizations: Not on file  . Attends Archivist Meetings: 1 to 4 times per year  . Marital Status: Married  Stress: No Stress Concern Present  . Feeling of Stress : Not at all  Tobacco Use: Medium Risk  . Smoking Tobacco Use: Former Smoker  . Smokeless Tobacco Use: Never Used  Transportation Needs: No Transportation Needs  . Lack of Transportation (Medical): No  . Lack of Transportation (Non-Medical): No   Patient spends a lot of time  outside and stays active to the point of being worn out in the afternoons. He doesn't take naps.  Patient lives with wife and granddaughter lives with them who is 18 years old along with her son who is 39 years old. They are fishing buddies and fish almost daily out back in the pond.   Patient's wife does the cooking and cleaning and they get takeout once a month. In the morning, he eats a toaster strudel without frosting. For lunch, he eats a sandwich. He eats a little vegetables, not every day, and has a salad every once in a while. He eats hot dogs for supper or chicken or beef and tries not to eat many sweets. He drinks water daily and Dr. Malachi Bonds once a day as well as juice in the morning.   He reports his sleep is good sometimes and sometimes not. He reports trouble going back to sleep and gets about 5-6 hours per night sometimes up to 8 hours. He does feel pretty rested and hardly takes naps.   Patient denies any issues with his medications but is trying to cut back on the amount of medications he has.   CCM Care Plan  Allergies  Allergen Reactions  . Codeine Nausea Only and Other (See Comments)    Cannot take on empty stomach.  . Valium Nausea Only and Other (See Comments)    Cannot take on empty stomach.  . Empagliflozin Other (See Comments)  . Simvastatin Other (See Comments)  . Sitagliptin Other (See Comments)    Medications Reviewed Today    Reviewed by Viona Gilmore, [facility] (Pharmacist) on 11/13/20 at 1614  Med List Status: <None>  Medication Order Taking? Sig Documenting Provider Last Dose Status Informant  amLODipine (NORVASC) 10 MG tablet [id number] Yes TAKE ONE-HALF TABLET BY  MOUTH DAILY Croitoru, Mihai, MD Taking Active   aspirin 81 MG tablet [id number] Yes Take 81 mg by mouth daily. [provider] Taking Active Spouse/Significant Other           Med Note (  Jonathon Jordan   Thu Sep 08, 2017 12:11 PM) Took 4 - 81 mg tablets today   ipratropium (ATROVENT) 0.06 %  nasal spray [id number] Yes Place 2 sprays into both nostrils 4 (four) times daily. [name], [name], MD Taking Active   LANTUS SOLOSTAR 100 UNIT/ML Solostar Pen [id number] Yes INJECT SUBCUTANEOUSLY 40  UNITS DAILY  Patient taking differently: Inject 16 Units into the skin daily.   [name], [name], MD Taking Active   losartan (COZAAR) 50 MG tablet [id number] Yes TAKE 1 TABLET BY MOUTH  TWICE DAILY Croitoru, Mihai, MD Taking Active   nitroGLYCERIN (NITROSTAT) 0.4 MG SL tablet [id number] No Place 1 tablet (0.4 mg total) under the tongue every 5 (five) minutes as needed for chest pain.  Patient not taking: Reported on 11/13/2020   Croitoru, Dani Gobble, MD Not Taking Expired 10/15/19 2359   ondansetron (ZOFRAN) 4 MG tablet [id number] Yes Take 1 tablet (4 mg total) by mouth daily as needed for nausea or vomiting. [name], [name], MD Taking Active   [facility] ULTRA test strip [id number]  CHECK BLOOD SUGAR 3 TIMES A DAY OR AS DIRECTED [name], [name], MD  Active   pantoprazole (PROTONIX) 40 MG tablet [id number] Yes TAKE 1 TABLET BY MOUTH  DAILY [name], [name], MD Taking Active   rosuvastatin (CRESTOR) 10 MG tablet [id number] Yes TAKE 1 TABLET BY MOUTH 6  TIMES A WEEK. Croitoru, Dani Gobble, MD Taking Active   [employer] PEN NEEDLES 31G X 6 MM [place] [id number]  USE AS DIRECTED [name], [name], MD  Active           Patient Active Problem List   Diagnosis Date Noted  . Thyroid nodule 09/08/2018  . Major depressive disorder, recurrent ([place]) 08/08/2018  . Tremor of both hands 07/03/2018  . Insomnia 07/03/2018  . Anxiety disorder 03/21/2018  . Nausea without vomiting 01/10/2018  . Bradycardia- with decrease of BB some improvement 09/10/2017  . S/P angioplasty with stent DES mid SVG to OM and DES to prox. SVG to OM, DAPT for lifetime 09/10/2017  . Unstable angina pectoris ([place]) 09/08/2017  . Chest pain with moderate risk for cardiac etiology 05/20/2017  . Carotid artery disease ([place]) 05/20/2017  . Allergic rhinitis  08/16/2016  . Chronic combined systolic and diastolic heart failure ([place]) 05/06/2015  . Groin pain 08/13/2014  . Prolonged Q-T interval on ECG 04/30/2014  . Ischemic cardiomyopathy 04/06/2014  . Unstable angina ([place]) 04/03/2014  . Syncope 02/02/2014  . Acute gastroenteritis 02/02/2014  . Hypertension   . GERD (gastroesophageal reflux disease)   . Atrial fibrillation ([place])   . 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 08/16/2013  . Type 2 diabetes mellitus with diabetic neuropathy, unspecified ([place]) 08/16/2013  . Hyperlipidemia, mixed 08/16/2013  . Aftercare following surgery of the circulatory system, [place] 07/31/2013  . 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[place]) 07/25/2012    Immunization History  Administered Date(s) Administered  . Fluad Quad(high Dose 65+) 04/11/2019, 04/11/2020  . Influenza, High Dose Seasonal PF 04/30/2014, 04/04/2015, 03/19/2016, 04/15/2017, 04/11/2018  . Moderna Sars-Covid-2 Vaccination 08/23/2019, 09/21/2019, 05/20/2020  . Pneumococcal Conjugate-13 02/24/2015  . Pneumococcal Polysaccharide-23 04/11/2020  . Tdap 07/12/2012  . Zoster 08/07/2013    Conditions to be addressed/monitored:  Hypertension, Hyperlipidemia, Diabetes, Atrial Fibrillation, Coronary Artery Disease and GERD  Care Plan : [place]  Updates made by Viona Gilmore, [place] since 11/28/2020 12:00 AM    Problem: Problem: Hypertension, Hyperlipidemia, Diabetes, Atrial  Fibrillation, Coronary Artery Disease and GERD     Long-Range Goal: Patient-Specific Goal   Start Date: 11/13/2020  Expected End Date: 11/13/2021  This Visit's Progress: On track  Priority: High  Note:   Current Barriers:  . Unable to independently afford treatment regimen . Unable to independently monitor therapeutic efficacy  Pharmacist Clinical Goal(s):  [name] Patient will verbalize ability to afford treatment regimen . achieve adherence to monitoring guidelines and  medication adherence to achieve therapeutic efficacy through collaboration with PharmD and provider.   Interventions: . 1:1 collaboration with [name], [name], MD regarding development and update of comprehensive plan of care as evidenced by provider attestation and co-signature . Inter-disciplinary care team collaboration (see longitudinal plan of care) . Comprehensive medication review performed; medication list updated in electronic medical record  Hypertension (BP goal <140/90) -Controlled -Current treatment: . Amlodipine 5 mg 1 tablet daily - in AM . Losartan 50 mg 1 tablet twice daily  -Medications previously tried: n/a  -Current home readings: does not check regularly -Current dietary habits: doesn't eat much salt at all; wife doesn't cook with it much -Current exercise habits: tries to stay active with gardening but no formal exercise -Denies hypotensive/hypertensive symptoms -Educated on Daily salt intake goal < 2300 mg; Importance of home blood pressure monitoring; Proper BP monitoring technique; -Counseled to monitor BP at home weekly, document, and provide log at future appointments -Counseled on diet and exercise extensively Recommended to continue current medication  Hyperlipidemia: (LDL goal < 70) -Uncontrolled -Current treatment: . Rosuvastatin 10 mg 1 tablet daily 6 days a week -Medications previously tried: simvastatin -Current dietary patterns: fries food often; recently switched to an air fryer -Current exercise habits: stays active but no formal exercise -Educated on Cholesterol goals;  Benefits of statin for ASCVD risk reduction; Exercise goal of 150 minutes per week; -Counseled on diet and exercise extensively Recommended to continue current medication Counseled on increasing to 6 days a week instead of 4 as patient was doing  CAD (Goal: prevent heart attacks and strokes) -Controlled -Current treatment  . Aspirin 81 mg 1 tablet daily . Rosuvastatin 10  mg 1 tablet daily 6 times a week . Nitroglycerin 0.4 mg 1 tablet as needed -Medications previously tried: none  -Recommended to continue current medication Counseled on monitoring for signs of bleeding such as unexplained and excessive bleeding from a cut or injury, easy or excessive bruising, blood in urine or stools, and nosebleeds without a known cause   Diabetes (A1c goal <7%) -Uncontrolled -Current medications: . Lantus 100 mg/unit inject 16 units daily - after he eats   . Toujeo 5 units inject 5 units before he eats -Medications previously tried: Jardiance (diaphoresis and anxiety), sitagliptin -Current home glucose readings . fasting glucose:  112-115 . post prandial glucose: 160-170, over 200 a couple of times -Denies hypoglycemic/hyperglycemic symptoms -Current meal patterns:  . breakfast: poptart  . lunch: sandwich  . dinner: hot dogs, chicken, beef and sometimes a vegetable . snacks: did not discuss . drinks: Dr. Malachi Bonds, water, and juice -Current exercise: no structured exercise -Educated on A1c and blood sugar goals; Exercise goal of 150 minutes per week; Benefits of routine self-monitoring of blood sugar; Carbohydrate counting and/or plate method -Counseled to check feet daily and get yearly eye exams -Counseled on diet and exercise extensively Recommended to continue current medication Assessed patient finances. Working on patient assistance for Lantus.  Anxiety (Goal: minimize symptoms) -Controlled -Current treatment: . No medications -Medications previously tried/failed: sertraline, citalopram -PHQ9: n/a -  GAD7: n/a -Recommended to continue as is  GERD (Goal: minimize symptoms of heartburn) -Controlled -Current treatment  . Pantoprazole 40 mg 1 tablet three times a week -Medications previously tried: none  -Counseled on decreasing to twice weekly to see if he tolerates  Allergic rhinitis (Goal: minimize symptoms) -Controlled -Current treatment   . Ipratropium 0.06% 2 sprays into both nostrils 4 times daily as needed . Loratadine 10 mg 1 tablet as needed -Medications previously tried: none  -Recommended to continue current medication  Health Maintenance -Vaccine gaps: shingrix -Current therapy:  . No medications -Educated on Cost vs benefit of each product must be carefully weighed by individual consumer -Patient is satisfied with current therapy and denies issues -Recommended to continue as is   Patient Goals/Self-Care Activities . Patient will:  - take medications as prescribed check glucose daily, document, and provide at future appointments target a minimum of 150 minutes of moderate intensity exercise weekly engage in dietary modifications by limiting carb intake  Follow Up Plan: Telephone follow up appointment with care management team member scheduled for: 3 months       Medication Assistance: Application for Lantus  medication assistance program. in process.  Anticipated assistance start date 11/28/20.  See plan of care for additional detail.  Patient's preferred pharmacy is:  [facility], [address] [place] [address] Phone: [phone] Fax: [facility], [address], [address] [place], [place] [place] [address] Phone: [phone] Fax: [phone]  Uses pill box? Yes Pt endorses 100% compliance - only misses it when he goes into appointments  We discussed: Current pharmacy is preferred with insurance plan and patient is satisfied with pharmacy services Patient decided to: Continue current medication management strategy  Care Plan and Follow Up Patient Decision:  Patient agrees to Care Plan and Follow-up.  Plan: Telephone follow up appointment with care management team member scheduled for:  3 months  Jeni Salles, PharmD [place] Pharmacist [place] at  [place] [phone]

## 2020-11-28 NOTE — Patient Instructions (Addendum)
Hi [name],  It was great to get to meet you over the telephone! Keep on working on making dietary changes as we discussed. Below is a summary of some of the topics we discussed.   Please reach out to me if you have any questions or need anything before our follow up!  Best, Maddie  Jeni Salles, PharmD, [facility]  Visit Information  Goals Addressed   None    Patient Care Plan: CCM Pharmacy Care Plan    Problem Identified: Problem: Hypertension, Hyperlipidemia, Diabetes, Atrial Fibrillation, Coronary Artery Disease and GERD     Long-Range Goal: Patient-Specific Goal   Start Date: 11/13/2020  Expected End Date: 11/13/2021  This Visit's Progress: On track  Priority: High  Note:   Current Barriers:  . Unable to independently afford treatment regimen . Unable to independently monitor therapeutic efficacy  Pharmacist Clinical Goal(s):  [name] Patient will verbalize ability to afford treatment regimen . achieve adherence to monitoring guidelines and medication adherence to achieve therapeutic efficacy through collaboration with PharmD and provider.   Interventions: . 1:1 collaboration with [name], [name], MD regarding development and update of comprehensive plan of care as evidenced by provider attestation and co-signature . Inter-disciplinary care team collaboration (see longitudinal plan of care) . Comprehensive medication review performed; medication list updated in electronic medical record  Hypertension (BP goal <140/90) -Controlled -Current treatment: . Amlodipine 5 mg 1 tablet daily - in AM . Losartan 50 mg 1 tablet twice daily  -Medications previously tried: n/a  -Current home readings: does not check regularly -Current dietary habits: doesn't eat much salt at all; wife doesn't cook with it much -Current exercise habits: tries to stay active with gardening but no formal exercise -Denies  hypotensive/hypertensive symptoms -Educated on Daily salt intake goal < 2300 mg; Importance of home blood pressure monitoring; Proper BP monitoring technique; -Counseled to monitor BP at home weekly, document, and provide log at future appointments -Counseled on diet and exercise extensively Recommended to continue current medication  Hyperlipidemia: (LDL goal < 70) -Uncontrolled -Current treatment: . Rosuvastatin 10 mg 1 tablet daily 6 days a week -Medications previously tried: simvastatin -Current dietary patterns: fries food often; recently switched to an air fryer -Current exercise habits: stays active but no formal exercise -Educated on Cholesterol goals;  Benefits of statin for ASCVD risk reduction; Exercise goal of 150 minutes per week; -Counseled on diet and exercise extensively Recommended to continue current medication Counseled on increasing to 6 days a week instead of 4 as patient was doing  CAD (Goal: prevent heart attacks and strokes) -Controlled -Current treatment  . Aspirin 81 mg 1 tablet daily . Rosuvastatin 10 mg 1 tablet daily 6 times a week . Nitroglycerin 0.4 mg 1 tablet as needed -Medications previously tried: none  -Recommended to continue current medication Counseled on monitoring for signs of bleeding such as unexplained and excessive bleeding from a cut or injury, easy or excessive bruising, blood in urine or stools, and nosebleeds without a known cause   Diabetes (A1c goal <7%) -Uncontrolled -Current medications: . Lantus 100 mg/unit inject 16 units daily - after he eats   . Toujeo 5 units inject 5 units before he eats -Medications previously tried: Jardiance (diaphoresis and anxiety), sitagliptin -Current home glucose readings . fasting glucose:  112-115 . post prandial glucose: 160-170, over 200 a couple of times -Denies hypoglycemic/hyperglycemic symptoms -Current meal patterns:  . breakfast: poptart  . lunch: sandwich  . dinner: hot dogs,  chicken, beef and sometimes a vegetable . snacks: did not discuss . drinks: Dr. Malachi Bonds, water, and juice -Current exercise: no structured exercise -Educated on A1c and blood sugar goals; Exercise goal of 150 minutes per week; Benefits of routine self-monitoring of blood sugar; Carbohydrate counting and/or plate method -Counseled to check feet daily and get yearly eye exams -Counseled on diet and exercise extensively Recommended to continue current medication Assessed patient finances. Working on patient assistance for Lantus.  Anxiety (Goal: minimize symptoms) -Controlled -Current treatment: . No medications -Medications previously tried/failed: sertraline, citalopram -PHQ9: n/a -GAD7: n/a -Recommended to continue as is  GERD (Goal: minimize symptoms of heartburn) -Controlled -Current treatment  . Pantoprazole 40 mg 1 tablet three times a week -Medications previously tried: none  -Counseled on decreasing to twice weekly to see if he tolerates  Allergic rhinitis (Goal: minimize symptoms) -Controlled -Current treatment  . Ipratropium 0.06% 2 sprays into both nostrils 4 times daily as needed . Loratadine 10 mg 1 tablet as needed -Medications previously tried: none  -Recommended to continue current medication  Health Maintenance -Vaccine gaps: shingrix -Current therapy:  . No medications -Educated on Cost vs benefit of each product must be carefully weighed by individual consumer -Patient is satisfied with current therapy and denies issues -Recommended to continue as is   Patient Goals/Self-Care Activities . Patient will:  - take medications as prescribed check glucose daily, document, and provide at future appointments target a minimum of 150 minutes of moderate intensity exercise weekly engage in dietary modifications by limiting carb intake  Follow Up Plan: Telephone follow up appointment with care management team member scheduled for: 3 months      Mr. Pellum  was given information about Chronic Care Management services today including:  1. CCM service includes personalized support from designated clinical staff supervised by his physician, including individualized plan of care and coordination with other care providers 2. 24/7 contact phone numbers for assistance for urgent and routine care needs. 3. Standard insurance, coinsurance, copays and deductibles apply for chronic care management only during months in which we provide at least 20 minutes of these services. Most insurances cover these services at 100%, however patients may be responsible for any copay, coinsurance and/or deductible if applicable. This service may help you avoid the need for more expensive face-to-face services. 4. Only one practitioner may furnish and bill the service in a calendar month. 5. The patient may stop CCM services at any time (effective at the end of the month) by phone call to the office staff.  Patient agreed to services and verbal consent obtained.   The patient verbalized understanding of instructions, educational materials, and care plan provided today and agreed to receive a mailed copy of patient instructions, educational materials, and care plan.  Telephone follow up appointment with pharmacy team member scheduled for: 3 months  Viona Gilmore, [facility]

## 2020-12-15 ENCOUNTER — Other Ambulatory Visit: Payer: Medicare Other

## 2020-12-15 ENCOUNTER — Ambulatory Visit: Payer: Medicare Other | Admitting: Vascular Surgery

## 2020-12-17 DIAGNOSIS — M25552 Pain in left hip: Secondary | ICD-10-CM | POA: Diagnosis not present

## 2021-01-30 ENCOUNTER — Telehealth: Payer: Self-pay | Admitting: Pharmacist

## 2021-01-30 NOTE — Chronic Care Management (AMB) (Signed)
Chronic Care Management Pharmacy Assistant   Name: [name]  MRN: [id number] DOB: 08-Aug-1945    Reason for Encounter: Disease State Hypertension Assessment Call   Conditions to be addressed/monitored: HTN   Recent office visits:  None  Recent consult visits:  None  Hospital visits:  None in previous 6 months  Medications: Outpatient Encounter Medications as of 01/30/2021  Medication Sig Note   amLODipine (NORVASC) 10 MG tablet TAKE ONE-HALF TABLET BY  MOUTH DAILY    aspirin 81 MG tablet Take 81 mg by mouth daily. 09/08/2017: Took 4 - 81 mg tablets today    Insulin Glargine (TOUJEO SOLOSTAR [place]) Inject 5 Units into the skin. Prior to meals    ipratropium (ATROVENT) 0.06 % nasal spray Place 2 sprays into both nostrils 4 (four) times daily.    LANTUS SOLOSTAR 100 UNIT/ML Solostar Pen INJECT SUBCUTANEOUSLY 40  UNITS DAILY (Patient taking differently: Inject 16 Units into the skin daily.)    losartan (COZAAR) 50 MG tablet TAKE 1 TABLET BY MOUTH  TWICE DAILY    nitroGLYCERIN (NITROSTAT) 0.4 MG SL tablet Place 1 tablet (0.4 mg total) under the tongue every 5 (five) minutes as needed for chest pain. (Patient not taking: Reported on 11/13/2020)    ondansetron (ZOFRAN) 4 MG tablet Take 1 tablet (4 mg total) by mouth daily as needed for nausea or vomiting.    ONETOUCH ULTRA test strip CHECK BLOOD SUGAR 3 TIMES A DAY OR AS DIRECTED    pantoprazole (PROTONIX) 40 MG tablet TAKE 1 TABLET BY MOUTH  DAILY    rosuvastatin (CRESTOR) 10 MG tablet TAKE 1 TABLET BY MOUTH 6  TIMES A WEEK.    ULTICARE MINI PEN NEEDLES 31G X 6 MM MISC USE AS DIRECTED    No facility-administered encounter medications on file as of 01/30/2021.   Reviewed chart prior to disease state call. Spoke with patient regarding BP  Recent Office Vitals: BP Readings from Last 3 Encounters:  10/31/20 132/76  10/13/20 140/72  04/11/20 124/80   Pulse Readings from Last 3 Encounters:  10/31/20 73  10/13/20 66  04/11/20 65     Wt Readings from Last 3 Encounters:  10/31/20 169 lb 9.6 oz (76.9 kg)  10/13/20 169 lb (76.7 kg)  04/11/20 169 lb 2 oz (76.7 kg)     Kidney Function Lab Results  Component Value Date/Time   CREATININE 1.23 10/16/2020 08:37 AM   CREATININE 1.19 (H) 04/11/2020 10:49 AM   CREATININE 1.28 (H) 07/16/2019 11:59 AM   CREATININE 1.03 04/03/2014 04:48 PM   GFR 64.41 07/03/2018 04:37 PM   GFRNONAA 60 04/11/2020 10:49 AM   GFRAA 69 04/11/2020 10:49 AM    BMP Latest Ref Rng & Units 10/16/2020 04/11/2020 07/16/2019  Glucose 65 - 99 mg/dL 129(H) 184(H) 195(H)  BUN 8 - 27 mg/dL '14 16 12  '$ Creatinine 0.76 - 1.27 mg/dL 1.23 1.19(H) 1.28(H)  BUN/Creat Ratio 10 - '24 11 13 '$ 9(L)  Sodium 134 - 144 mmol/L 140 139 140  Potassium 3.5 - 5.2 mmol/L 4.1 4.3 4.2  Chloride 96 - 106 mmol/L 102 102 101  CO2 20 - 29 mmol/L '24 25 24  '$ Calcium 8.6 - 10.2 mg/dL 9.4 9.6 9.7    Current antihypertensive regimen:  Amlodipine 10 mg take one half tablet once daily Losartan '50mg'$  one tablet twice daily How often are you checking your Blood Pressure? daily Current home BP readings: Patient reports he does not keep a log has not been feeling  well enough the past few days to check. What recent interventions/DTPs have been made by any provider to improve Blood Pressure control since last CPP Visit: None Any recent hospitalizations or ED visits since last visit with CPP? No What diet changes have been made to improve Blood Pressure Control?  None.  What exercise is being done to improve your Blood Pressure Control?  None. See above, Adherence Review: Is the patient currently on ACE/ARB medication? Yes Does the patient have >5 day gap between last estimated fill dates? Yes  Notes:  Patient reported the last time had a good meal was on this past Monday he has not had much of an appetite since, during that meal he then began to feel arm numbness and fatigue so he checked his temperature and was 105, he has been taking  tylenol in addition to his other prescribed medications and has been running a fever since, on today it was 102. Patient reports no other symptoms and that he had taken an at home COVID test on Tuesday that was negative.   Per CPP Jeni Salles call back to patient and asked if he could call office and schedule appointment, patient stated he had death in the family and wouldn't be able to come into office. CPP advised she would have office staff call and check in on him.   Fill History:  AMLODIPINE BESYLATE  10 MG TABS 06/06/2020 90   LOSARTAN POTASSIUM  50 MG TABS 04/28/2020 90    Care Gaps:  Zoster Vaccine - Overdue Foot exam - Overdue HgbA1C- Overdue 02/13/21- Next CPP visit call 10-2020- Annual Wellness Visit done  Star Rating Drugs:  Losartan  '50mg'$  - Last filled 04-28-2020 90[facility] at [facility] Rosuvastatin '10mg'$  - Last filled 01-06-2021 90DS at [facility] Pharmacist Assistant [phone]

## 2021-02-04 ENCOUNTER — Other Ambulatory Visit (INDEPENDENT_AMBULATORY_CARE_PROVIDER_SITE_OTHER): Payer: Medicare Other

## 2021-02-04 ENCOUNTER — Other Ambulatory Visit (HOSPITAL_COMMUNITY): Payer: Self-pay | Admitting: Vascular Surgery

## 2021-02-04 ENCOUNTER — Other Ambulatory Visit: Payer: Self-pay

## 2021-02-04 ENCOUNTER — Encounter: Payer: Self-pay | Admitting: Vascular Surgery

## 2021-02-04 ENCOUNTER — Ambulatory Visit: Payer: Medicare Other | Admitting: Vascular Surgery

## 2021-02-04 VITALS — BP 153/70 | HR 57 | Temp 98.2°F | Ht 69.0 in | Wt 168.8 lb

## 2021-02-04 DIAGNOSIS — Z9889 Other specified postprocedural states: Secondary | ICD-10-CM

## 2021-02-04 DIAGNOSIS — Z8679 Personal history of other diseases of the circulatory system: Secondary | ICD-10-CM

## 2021-02-04 DIAGNOSIS — I723 Aneurysm of iliac artery: Secondary | ICD-10-CM | POA: Diagnosis not present

## 2021-02-04 NOTE — Progress Notes (Signed)
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 Kellie Simmering  HPI: [name] is a 75 y.o. male here today for follow-up.  He is here today with his wife.  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  He had exclusion of his right internal iliac and had an aorto to right external iliac and left common iliac artery endograft.  He has done well since that time with no difficulty related to his aneurysm.  He is seen today for duplex follow-up.  Past Medical History:  Diagnosis Date   Anxiety    Arthritis    "back" (04/05/2014)   Atrial fibrillation (HCC)    Blood in stool    Bradycardia- with decrease of BB some improvement 09/10/2017   CAD (coronary artery disease)    CAD s/p CABG 2006 08/16/2013   a. Dr Darcey Nora - to LAD, SVG to diagonal, SVG to OM, SVG to PDA ;  b.  LHC (9/15): ostial LAD occl, ostial CFX 60%, prox AVCFX 95%, RCA 95%, S-PDA occl, S-OM/Dx with Dx limb occl and prox 60%, L-LAD patent, EF 35-40% wtih ant-lat and apical HK-AK >> PCI:  BMS to native RCA    Diverticulosis    Dizziness    DM type 2 causing complication ([place]) 123456   GERD (gastroesophageal reflux disease)    Hypertension    Iliac artery aneurysm ([place])    Ischemic cardiomyopathy    a. EF 35-40% by LV gram 03/2014   Joint pain    Kidney stones    Kidney stones    Lower back pain Jan 2015   Mixed hyperlipidemia 08/16/2013   Myocardial infarction ([place]) 03/2014 X 2?   S/P angioplasty with stent DES mid SVG to OM and DES to prox. SVG to OM, DAPT for lifetime 09/10/2017   Type II diabetes mellitus ([place])     Family History  Problem Relation Age of Onset   Diabetes Mother    Hypertension Mother    Heart disease Mother        AAA   Heart disease Sister    Heart disease Son         Heart Disease before age 43   Diabetes Brother    Thyroid disease Neg Hx     SOCIAL HISTORY: Social History   Tobacco Use   Smoking status: Former    Types: Cigars    Quit date: 09/09/2017    Years since quitting: 3.4   Smokeless tobacco: Never   Tobacco comments:    states he smoked cigar   Substance Use Topics   Alcohol use: No    Allergies  Allergen Reactions   Codeine Nausea Only and Other (See Comments)    Cannot take on empty stomach.   Valium Nausea Only and Other (See Comments)    Cannot take on empty stomach.   Empagliflozin Other (See Comments)   Simvastatin Other (See Comments)   Sitagliptin Other (See Comments)    Current Outpatient Medications  Medication Sig Dispense Refill   amLODipine (NORVASC) 10 MG tablet TAKE ONE-HALF TABLET BY  MOUTH DAILY 45 tablet 3   aspirin 81 MG tablet Take 81 mg by mouth daily.     Insulin Glargine (TOUJEO SOLOSTAR [place]) Inject 5 Units into the skin. Prior to meals  ipratropium (ATROVENT) 0.06 % nasal spray Place 2 sprays into both nostrils 4 (four) times daily. 15 mL 2   LANTUS SOLOSTAR 100 UNIT/ML Solostar Pen INJECT SUBCUTANEOUSLY 40  UNITS DAILY (Patient taking differently: Inject 16 Units into the skin daily.) 45 mL 3   losartan (COZAAR) 50 MG tablet TAKE 1 TABLET BY MOUTH  TWICE DAILY 180 tablet 2   nitroGLYCERIN (NITROSTAT) 0.4 MG SL tablet Place 1 tablet (0.4 mg total) under the tongue every 5 (five) minutes as needed for chest pain. (Patient not taking: Reported on 11/13/2020) 25 tablet 1   ondansetron (ZOFRAN) 4 MG tablet Take 1 tablet (4 mg total) by mouth daily as needed for nausea or vomiting. 15 tablet 0   ONETOUCH ULTRA test strip CHECK BLOOD SUGAR 3 TIMES A DAY OR AS DIRECTED 100 strip 0   pantoprazole (PROTONIX) 40 MG tablet TAKE 1 TABLET BY MOUTH  DAILY 90 tablet 3   rosuvastatin (CRESTOR) 10 MG tablet TAKE 1 TABLET BY MOUTH 6  TIMES A WEEK. 26 tablet 11   ULTICARE MINI PEN NEEDLES 31G X 6 MM MISC USE AS DIRECTED  100 each 1   No current facility-administered medications for this visit.    REVIEW OF SYSTEMS:  '[X]'$  denotes positive finding, '[ ]'$  denotes negative finding Cardiac  Comments:  Chest pain or chest pressure:    Shortness of breath upon exertion:    Short of breath when lying flat:    Irregular heart rhythm:        Vascular    Pain in calf, thigh, or hip brought on by ambulation:    Pain in feet at night that wakes you up from your sleep:     Blood clot in your veins:    Leg swelling:           PHYSICAL EXAM: Vitals:   02/04/21 0933  BP: (!) 153/70  Pulse: (!) 57  Temp: 98.2 F (36.8 C)  TempSrc: Oral  SpO2: 97%  Weight: 168 lb 12.8 oz (76.6 kg)  Height: '5\' 9"'$  (1.753 m)    GENERAL: The patient is a well-nourished male, in no acute distress. The vital signs are documented above. CARDIOVASCULAR: 2+ radial, 2+ femoral and 2+ dorsalis pedis pulses bilaterally.  No evidence of popliteal artery aneurysm by physical exam PULMONARY: There is good air exchange  MUSCULOSKELETAL: There are no major deformities or cyanosis. NEUROLOGIC: No focal weakness or paresthesias are detected. SKIN: There are no ulcers or rashes noted. PSYCHIATRIC: The patient has a normal affect.  DATA:  Maximal aortic diameter by ultrasound is 2.39.  Right iliac is 1.39 and left iliac is 1.33 cm.  MEDICAL ISSUES: Stable now 11 years out from stent graft repair.  He has had several serial ultrasounds today suggesting complete resolution of aneurysmal dilatation.  I do not feel that there is any need for ongoing duplex surveillance.  I did discuss the very unlikely potential for graft occlusion and what to expect should this occur.  He will see [place] again on an as needed basis    Rosetta Posner, MD FACS [facility] [place] Office Tel [phone]  Note: Portions of this report may have been transcribed using voice recognition software.  Every effort has been made to ensure accuracy;  however, inadvertent computerized transcription errors may still be present.

## 2021-02-12 ENCOUNTER — Telehealth: Payer: Self-pay | Admitting: Pharmacist

## 2021-02-12 NOTE — Chronic Care Management (AMB) (Addendum)
    Chronic Care Management Pharmacy Assistant   Name: [name]  MRN: [id number] DOB: 1946/05/06  02-12-2021- Patient called to remind of appointment with Jeni Salles Clinical Pharmacist on August 5 at 10am via phone call  Left message of appointment date, time with patients wife advised  to have all medications, supplements, blood pressure and/or blood sugar logs available during appointment and to return call if need to reschedule.    Care Gaps: Zoster Vaccine - Overdue Foot exam - Overdue HgbA1C- Overdue 02/13/21- Next CPP visit call 10-2020- Annual Wellness Visit done  Star Rating Drug:  Losartan  '50mg'$  - Last filled 04-28-2020 90DS at [facility] Rosuvastatin '10mg'$  - Last filled 01-06-2021 90[facility] at [facility]   Any gaps in medications fill history? AMLODIPINE BESYLATE  10 MG TABS 06/06/2020 90    LOSARTAN POTASSIUM  50 MG TABS 04/28/2020 90    02-13-2021 Call to pharmacy spoke to [place] ([facility]) she verified these fill dates are accurate with them.  Call to [facility] she notes that he has not filled these medications with them  CPP Aware  Medications: Outpatient Encounter Medications as of 02/12/2021  Medication Sig Note   amLODipine (NORVASC) 10 MG tablet TAKE ONE-HALF TABLET BY  MOUTH DAILY    aspirin 81 MG tablet Take 81 mg by mouth daily. 09/08/2017: Took 4 - 81 mg tablets today    Insulin Glargine (TOUJEO SOLOSTAR [place]) Inject 5 Units into the skin. Prior to meals    ipratropium (ATROVENT) 0.06 % nasal spray Place 2 sprays into both nostrils 4 (four) times daily.    LANTUS SOLOSTAR 100 UNIT/ML Solostar Pen INJECT SUBCUTANEOUSLY 40  UNITS DAILY (Patient taking differently: Inject 16 Units into the skin daily.)    losartan (COZAAR) 50 MG tablet TAKE 1 TABLET BY MOUTH  TWICE DAILY    nitroGLYCERIN (NITROSTAT) 0.4 MG SL tablet Place 1 tablet (0.4 mg total) under the tongue every 5 (five) minutes as needed for chest pain. (Patient not taking: Reported on 11/13/2020)     ondansetron (ZOFRAN) 4 MG tablet Take 1 tablet (4 mg total) by mouth daily as needed for nausea or vomiting.    ONETOUCH ULTRA test strip CHECK BLOOD SUGAR 3 TIMES A DAY OR AS DIRECTED    pantoprazole (PROTONIX) 40 MG tablet TAKE 1 TABLET BY MOUTH  DAILY    rosuvastatin (CRESTOR) 10 MG tablet TAKE 1 TABLET BY MOUTH 6  TIMES A WEEK.    ULTICARE MINI PEN NEEDLES 31G X 6 MM MISC USE AS DIRECTED    No facility-administered encounter medications on file as of 02/12/2021.     [facility] Clinical Pharmacist Assistant [phone]

## 2021-02-13 ENCOUNTER — Ambulatory Visit (INDEPENDENT_AMBULATORY_CARE_PROVIDER_SITE_OTHER): Payer: Medicare Other | Admitting: Pharmacist

## 2021-02-13 ENCOUNTER — Telehealth: Payer: Self-pay | Admitting: Cardiovascular Disease

## 2021-02-13 DIAGNOSIS — E1140 Type 2 diabetes mellitus with diabetic neuropathy, unspecified: Secondary | ICD-10-CM | POA: Diagnosis not present

## 2021-02-13 DIAGNOSIS — Z794 Long term (current) use of insulin: Secondary | ICD-10-CM

## 2021-02-13 DIAGNOSIS — I10 Essential (primary) hypertension: Secondary | ICD-10-CM | POA: Diagnosis not present

## 2021-02-13 MED ORDER — LOSARTAN POTASSIUM 50 MG PO TABS: 50.0000 mg | ORAL_TABLET | Freq: Two times a day (BID) | ORAL | 3 refills | Status: DC

## 2021-02-13 NOTE — Telephone Encounter (Signed)
*  STAT* If patient is at the pharmacy, call can be transferred to refill team.   1. Which medications need to be refilled? (please list name of each medication and dose if known) losartan (COZAAR) 50 MG tablet   2. Which pharmacy/location (including street and city if local pharmacy) is medication to be sent to? [facility] - [place], [place]  3. Do they need a 30 day or 90 day supply? 90 ds    Pt hasnt taken meds in a month or longer...would like to know if they should continue on the same dose. Please advise

## 2021-02-13 NOTE — Progress Notes (Signed)
Chronic Care Management Pharmacy Note  03/05/2021 Name:  [name] MRN:  [id number] DOB:  1946-05-24  Summary: BP not at goal < 130/80 per office readings Pt reports more fatigue lately   Recommendations/Changes made from today's visit: -Recommended restarting losartan 50 mg 2 tablets daily and requested refill from cardiologist -Recommended monitoring of BP at home regularly (at least once weekly) -Recommended discussing with endocrinologist about using a CGM   Plan: BP assessment in 4 weeks  Subjective: [name] is an 75 y.o. year old male who is a primary patient of [name], [name], MD.  The CCM team was consulted for assistance with disease management and care coordination needs.    Engaged with patient by telephone for follow up visit in response to provider referral for pharmacy case management and/or care coordination services.   Consent to Services:  The patient was given information about Chronic Care Management services, agreed to services, and gave verbal consent prior to initiation of services.  Please see initial visit note for detailed documentation.   Patient Care Team: [name], Scott G, MD as PCP - General (Family Medicine) Croitoru, Dani Gobble, MD as PCP - Cardiology (Cardiology) Sanda Klein, MD as Attending Physician (Cardiology) Sanda Klein, MD as Consulting Physician (Cardiology) Ralene Bathe, MD as Consulting Physician (Ophthalmology) Viona Gilmore, [facility] as Pharmacist (Pharmacist)  Recent office visits: 10/13/20 [name], LPN: Patient presented for Medicare Wellness Exam.  06/18/20 [name], Scott G, MD (PCP): Patient presented for video visit for nausea without vomiting. Placed referral to gastroenterology. Prescribed benzonatate 200 mg Oral 2 times daily as needed and ipratropium bromide 0.06 % 2 sprays Each Nare 4 times daily. Changed ondansetron to 4 mg daily PRN.  Recent consult visits: 02/04/21 [name], MD (vasc surgery): Patient  presented for status post EVAR.  12/17/20 [name], PA (emergeortho): Patient presented for left hip pain. Unable to access notes.  10/31/20 Croitoru, Dani Gobble, MD Cardiology: Patient presented for CAD follow up. Increased rosuvastatin 10 mg to 6 times a week.   10/28/20 [name], MD Otolaryngology: Patient presented for sensorineural hearing loss (SNHL) of both ears.  08/14/20 [name] Endocrinology: Patient presented for DM follow up.   Hospital visits: None in previous 6 months  Objective:  Lab Results  Component Value Date   CREATININE 1.23 10/16/2020   BUN 14 10/16/2020   GFR 64.41 07/03/2018   GFRNONAA 60 04/11/2020   GFRAA 69 04/11/2020   NA 140 10/16/2020   K 4.1 10/16/2020   CALCIUM 9.4 10/16/2020   CO2 24 10/16/2020   GLUCOSE 129 (H) 10/16/2020    Lab Results  Component Value Date/Time   HGBA1C 8.6 (H) 12/25/2018 08:37 AM   HGBA1C 7.5 (A) 07/03/2018 03:58 PM   HGBA1C 6.6 (A) 01/10/2018 02:59 PM   HGBA1C 6.3 06/10/2017 12:39 PM   HGBA1C 6.6 06/15/2016 12:00 AM   FRUCTOSAMINE 236 12/07/2016 11:37 AM   GFR 64.41 07/03/2018 04:37 PM   GFR 53.81 (L) 03/21/2018 12:02 PM   MICROALBUR 2.2 (H) 01/10/2018 03:09 PM   MICROALBUR 1.6 08/16/2016 03:20 PM    Last diabetic Eye exam:  Lab Results  Component Value Date/Time   HMDIABEYEEXA No Retinopathy 11/10/2020 12:00 AM    Last diabetic Foot exam: No results found for: HMDIABFOOTEX   Lab Results  Component Value Date   CHOL 161 10/16/2020   HDL 29 (L) 10/16/2020   LDLCALC 92 10/16/2020   LDLDIRECT 74.0 12/28/2016   TRIG 234 (H) 10/16/2020  CHOLHDL 5.6 (H) 10/16/2020    Hepatic Function Latest Ref Rng & Units 04/11/2020 12/25/2018 06/15/2016  Total Protein 6.1 - 8.1 Steele/dL 6.9 7.0 -  Albumin 3.7 - 4.7 Steele/dL - 4.5 -  AST 10 - 35 U/L 32 48(H) 34  ALT 9 - 46 U/L 13 19 12   Alk Phosphatase 39 - 117 IU/L - 97 75  Total Bilirubin 0.2 - 1.2 mg/dL 0.8 0.8 -    Lab Results  Component Value Date/Time    TSH 3.12 08/08/2018 04:40 PM   TSH 4.510 (H) 12/20/2017 09:37 AM   FREET4 1.35 12/20/2017 09:37 AM    CBC Latest Ref Rng & Units 03/21/2018 11/28/2017 09/27/2017  WBC 4.0 - 10.5 K/uL 8.8 7.1 8.5  Hemoglobin 13.0 - 17.0 Steele/dL 15.3 14.4 14.6  Hematocrit 39.0 - 52.0 % 44.4 42.3 40.9  Platelets 150.0 - 400.0 K/uL 267.0 161 189    No results found for: VD25OH  Clinical ASCVD: Yes  The 10-year ASCVD risk score Mikey Bussing DC Jr., et al., 2013) is: 49.5%   Values used to calculate the score:     Age: 54 years     Sex: Male     Is Non-Hispanic African American: No     Diabetic: Yes     Tobacco smoker: No     Systolic Blood Pressure: 347 mmHg     Is BP treated: Yes     HDL Cholesterol: 29 mg/dL     Total Cholesterol: 161 mg/dL    Depression screen [facility] 2/9 02/04/2021 10/13/2020 10/13/2020  Decreased Interest 0 0 0  Down, Depressed, Hopeless 0 0 0  PHQ - 2 Score 0 0 0  Altered sleeping - - -  Tired, decreased energy - - -  Change in appetite - - -  Feeling bad or failure about yourself  - - -  Trouble concentrating - - -  Moving slowly or fidgety/restless - - -  Suicidal thoughts - - -  PHQ-9 Score - - -  Difficult doing work/chores - - -      Social History   Tobacco Use  Smoking Status Former   Types: Cigars   Quit date: 09/09/2017   Years since quitting: 3.4  Smokeless Tobacco Never  Tobacco Comments   states he smoked cigar    BP Readings from Last 3 Encounters:  02/04/21 (!) 153/70  10/31/20 132/76  10/13/20 140/72   Pulse Readings from Last 3 Encounters:  02/04/21 (!) 57  10/31/20 73  10/13/20 66   Wt Readings from Last 3 Encounters:  02/04/21 168 lb 12.8 oz (76.6 kg)  10/31/20 169 lb 9.6 oz (76.9 kg)  10/13/20 169 lb (76.7 kg)   BMI Readings from Last 3 Encounters:  02/04/21 24.93 kg/m  10/31/20 25.05 kg/m  10/13/20 24.96 kg/m    Assessment/Interventions: Review of patient past medical history, allergies, medications, health status, including review of  consultants reports, laboratory and other test data, was performed as part of comprehensive evaluation and provision of chronic care management services.   SDOH:  (Social Determinants of Health) assessments and interventions performed: Yes   SDOH Screenings   Alcohol Screen: Low Risk    Last Alcohol Screening Score (AUDIT): 0  Depression (PHQ2-9): Low Risk    PHQ-2 Score: 0  Financial Resource Strain: Medium Risk   Difficulty of Paying Living Expenses: Somewhat hard  Food Insecurity: No Food Insecurity   Worried About Running Out of Food in the Last Year: Never true  Ran Out of Food in the Last Year: Never true  Housing: Low Risk    Last Housing Risk Score: 0  Physical Activity: Insufficiently Active   Days of Exercise per Week: 3 days   Minutes of Exercise per Session: 30 min  Social Connections: Socially Integrated   Frequency of Communication with Friends and Family: More than three times a week   Frequency of Social Gatherings with Friends and Family: More than three times a week   Attends Religious Services: More than 4 times per year   Active Member of [employer] or Organizations: Not on file   Attends Archivist Meetings: 1 to 4 times per year   Marital Status: Married  Stress: No Stress Concern Present   Feeling of Stress : Not at all  Tobacco Use: Medium Risk   Smoking Tobacco Use: Former   Smokeless Tobacco Use: Never  Transportation Needs: No Data processing manager (Medical): No   Lack of Transportation (Non-Medical): No     CCM Care Plan  Allergies  Allergen Reactions   Codeine Nausea Only and Other (See Comments)    Cannot take on empty stomach.   Valium Nausea Only and Other (See Comments)    Cannot take on empty stomach.   Empagliflozin Other (See Comments)   Simvastatin Other (See Comments)   Sitagliptin Other (See Comments)    Medications Reviewed Today     Reviewed by Rosetta Posner, MD (Physician) on 02/04/21 at 1141   Med List Status: <None>   Medication Order Taking? Sig Documenting Provider Last Dose Status Informant  amLODipine (NORVASC) 10 MG tablet [id number] No TAKE ONE-HALF TABLET BY  MOUTH DAILY Croitoru, Mihai, MD Taking Active   aspirin 81 MG tablet [id number] No Take 81 mg by mouth daily. [provider] Taking Active Spouse/Significant Other           Med Note Unk Lightning Sep 08, 2017 12:11 PM) Took 4 - 81 mg tablets today   Insulin Glargine (TOUJEO SOLOSTAR [place]) [id number] No Inject 5 Units into the skin. Prior to meals [provider] Taking Active   ipratropium (ATROVENT) 0.06 % nasal spray [id number] No Place 2 sprays into both nostrils 4 (four) times daily. [name], Scott G, MD Taking Active   LANTUS SOLOSTAR 100 UNIT/ML Solostar Pen [id number] No INJECT SUBCUTANEOUSLY 40  UNITS DAILY  Patient taking differently: Inject 16 Units into the skin daily.   [name], Scott G, MD Taking Active   losartan (COZAAR) 50 MG tablet [id number] No TAKE 1 TABLET BY MOUTH  TWICE DAILY Croitoru, Mihai, MD Taking Active   nitroGLYCERIN (NITROSTAT) 0.4 MG SL tablet [id number] No Place 1 tablet (0.4 mg total) under the tongue every 5 (five) minutes as needed for chest pain.  Patient not taking: Reported on 11/13/2020   Croitoru, Dani Gobble, MD Not Taking Expired 10/15/19 2359   ondansetron (ZOFRAN) 4 MG tablet [id number] No Take 1 tablet (4 mg total) by mouth daily as needed for nausea or vomiting. [name], Scott G, MD Taking Active   [facility] ULTRA test strip [id number] No CHECK BLOOD SUGAR 3 TIMES A DAY OR AS DIRECTED [name], Scott G, MD Taking Active   pantoprazole (PROTONIX) 40 MG tablet [id number] No TAKE 1 TABLET BY MOUTH  DAILY [name], Scott G, MD Taking Active   rosuvastatin (CRESTOR) 10 MG tablet [id number] No TAKE 1 TABLET BY MOUTH 6  TIMES A WEEK. Croitoru, Dani Gobble, MD Taking Active  ULTICARE MINI PEN NEEDLES 31G X 6 MM MISC [id number] No USE AS DIRECTED [name], Scott G, MD Taking Active              Patient Active Problem List   Diagnosis Date Noted   Thyroid nodule 09/08/2018   Major depressive disorder, recurrent ([place]) 08/08/2018   Tremor of both hands 07/03/2018   Insomnia 07/03/2018   Anxiety disorder 03/21/2018   Nausea without vomiting 01/10/2018   Bradycardia- with decrease of BB some improvement 09/10/2017   S/P angioplasty with stent DES mid SVG to OM and DES to prox. SVG to OM, DAPT for lifetime 09/10/2017   Unstable angina pectoris ([place]) 09/08/2017   Chest pain with moderate risk for cardiac etiology 05/20/2017   Carotid artery disease ([place]) 05/20/2017   Allergic rhinitis 08/16/2016   Chronic combined systolic and diastolic heart failure ([place]) 05/06/2015   Groin pain 08/13/2014   Prolonged Q-T interval on ECG 04/30/2014   Ischemic cardiomyopathy 04/06/2014   Unstable angina (HCC) 04/03/2014   Syncope 02/02/2014   Acute gastroenteritis 02/02/2014   Hypertension    GERD (gastroesophageal reflux disease)    Atrial fibrillation ([place])    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 08/16/2013   Type 2 diabetes mellitus with diabetic neuropathy, unspecified ([place]) 08/16/2013   Hyperlipidemia, mixed 08/16/2013   Aftercare following surgery of the circulatory system, NEC 07/31/2013   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[place]) 07/25/2012    Immunization History  Administered Date(s) Administered   Fluad Quad(high Dose 65+) 04/11/2019, 04/11/2020   Influenza, High Dose Seasonal PF 04/30/2014, 04/04/2015, 03/19/2016, 04/15/2017, 04/11/2018   Moderna Sars-Covid-2 Vaccination 08/23/2019, 09/21/2019, 05/20/2020, 01/20/2021   Pneumococcal Conjugate-13 02/24/2015   Pneumococcal Polysaccharide-23 04/11/2020   Tdap 07/12/2012   Zoster, Live 08/07/2013   Patient reported he hasn't filled out Lantus PAP application yet because he had to have an appointment with endo and he canceled his previous one because of his wife.  Patient  reports he is feeling tired all the time and has only been checking his BGs in the morning.  Conditions to be addressed/monitored:  Hypertension, Hyperlipidemia, Diabetes, Atrial Fibrillation, Coronary Artery Disease and GERD  Conditions addressed this visit: Hypertension, diabetes  Care Plan : CCM Pharmacy Care Plan  Updates made by Viona Gilmore, [place] since 03/05/2021 12:00 AM     Problem: Problem: Hypertension, Hyperlipidemia, Diabetes, Atrial Fibrillation, Coronary Artery Disease and GERD      Long-Range Goal: Patient-Specific Goal   Start Date: 11/13/2020  Expected End Date: 11/13/2021  Recent Progress: On track  Priority: High  Note:   Current Barriers:  Unable to independently afford treatment regimen Unable to independently monitor therapeutic efficacy  Pharmacist Clinical Goal(s):  Patient will verbalize ability to afford treatment regimen achieve adherence to monitoring guidelines and medication adherence to achieve therapeutic efficacy through collaboration with PharmD and provider.   Interventions: 1:1 collaboration with [name], Scott G, MD regarding development and update of comprehensive plan of care as evidenced by provider attestation and co-signature Inter-disciplinary care team collaboration (see longitudinal plan of care) Comprehensive medication review performed; medication list updated in electronic medical record  Hypertension (BP goal <140/90) -Controlled -Current treatment: Amlodipine 5 mg 1 tablet daily - in AM Losartan 50 mg 1 tablet twice daily - not taking -Medications previously tried: n/a  -Current home readings: does not check regularly -Current dietary habits: doesn't eat much salt at all; wife doesn't cook with it much -Current exercise habits: tries  to stay active with gardening but no formal exercise -Denies hypotensive/hypertensive symptoms -Educated on Daily salt intake goal < 2300 mg; Importance of home blood pressure  monitoring; Proper BP monitoring technique; -Counseled to monitor BP at home weekly, document, and provide log at future appointments -Counseled on diet and exercise extensively Recommended restarting losartan as prescribed.  Hyperlipidemia: (LDL goal < 70) -Uncontrolled -Current treatment: Rosuvastatin 10 mg 1 tablet daily 6 days a week -Medications previously tried: simvastatin -Current dietary patterns: fries food often; recently switched to an air fryer -Current exercise habits: stays active but no formal exercise -Educated on Cholesterol goals;  Benefits of statin for ASCVD risk reduction; Exercise goal of 150 minutes per week; -Counseled on diet and exercise extensively Recommended to continue current medication Recommended repeat lipid panel.  CAD (Goal: prevent heart attacks and strokes) -Controlled -Current treatment  Aspirin 81 mg 1 tablet daily Rosuvastatin 10 mg 1 tablet daily 6 times a week Nitroglycerin 0.4 mg 1 tablet as needed -Medications previously tried: none  -Recommended to continue current medication Counseled on monitoring for signs of bleeding such as unexplained and excessive bleeding from a cut or injury, easy or excessive bruising, blood in urine or stools, and nosebleeds without a known cause   Diabetes (A1c goal <7%) -Uncontrolled -Current medications: Lantus 100 mg/unit inject 16 units daily - after he eats   Toujeo 5 units inject 5 units before he eats -Medications previously tried: Jardiance (diaphoresis and anxiety), sitagliptin -Current home glucose readings fasting glucose:  125, 225, 123, 133, 207 post prandial glucose: n/a -Denies hypoglycemic/hyperglycemic symptoms -Current meal patterns:  breakfast: poptart  lunch: sandwich  dinner: hot dogs, chicken, beef and sometimes a vegetable snacks: did not discuss drinks: Dr. Malachi Bonds, water, and juice -Current exercise: no structured exercise -Educated on A1c and blood sugar  goals; Exercise goal of 150 minutes per week; Benefits of routine self-monitoring of blood sugar; Continuous glucose monitoring; Carbohydrate counting and/or plate method -Counseled to check feet daily and get yearly eye exams -Counseled on diet and exercise extensively Recommended to continue current medication Recommended discussing the use of a CGM with his endocrinologist.  Anxiety (Goal: minimize symptoms) -Controlled -Current treatment: No medications -Medications previously tried/failed: sertraline, citalopram -PHQ9: n/a -GAD7: n/a -Recommended to continue as is  GERD (Goal: minimize symptoms of heartburn) -Controlled -Current treatment  Pantoprazole 40 mg 1 tablet three times a week -Medications previously tried: none  -Counseled on decreasing to twice weekly to see if he tolerates  Allergic rhinitis (Goal: minimize symptoms) -Controlled -Current treatment  Ipratropium 0.06% 2 sprays into both nostrils 4 times daily as needed Loratadine 10 mg 1 tablet as needed -Medications previously tried: none  -Recommended to continue current medication  Health Maintenance -Vaccine gaps: shingrix, influenza vaccine -Current therapy:  No medications -Educated on Cost vs benefit of each product must be carefully weighed by individual consumer -Patient is satisfied with current therapy and denies issues -Recommended to continue as is   Patient Goals/Self-Care Activities Patient will:  - take medications as prescribed check glucose daily, document, and provide at future appointments target a minimum of 150 minutes of moderate intensity exercise weekly engage in dietary modifications by limiting carb intake  Follow Up Plan: Telephone follow up appointment with care management team member scheduled for: 4 months         Medication Assistance: Application for Lantus  medication assistance program. in process.  Anticipated assistance start date 11/28/20.  See plan of care  for additional detail.  Compliance/Adherence/Medication fill history: Care Gaps: Shingrix, foot exam, influenza, A1c   Star-Rating Drugs: Losartan  40m - Last filled 04-28-2020 90DS at [facility] Rosuvastatin 110m- [facility]:  [place]hone: [phone]: [phone]  (OpBrookmontCADousmanoTowneroSharonuite [id number]: [phone]: [phone] ([facility]) - [place]HarringtonK[place]hone: [phone]: [phone] pill box? Yes Pt endorses 100% compliance - only misses it when he goes into appointments  We discussed: Current pharmacy is preferred with insurance plan and patient is satisfied with pharmacy services Patient decided to: Continue current medication management strategy  Care Plan and Follow Up Patient Decision:  Patient agrees to Care Plan and Follow-up.  Plan: Telephone follow up appointment with care management team member scheduled for:  3 months  MaJeni SallesPharmD BCBowmans Additionharmacist LeLaurelt [phone]

## 2021-02-13 NOTE — Telephone Encounter (Signed)
Rx has been sent in. 

## 2021-02-17 DIAGNOSIS — E785 Hyperlipidemia, unspecified: Secondary | ICD-10-CM | POA: Diagnosis not present

## 2021-02-17 DIAGNOSIS — E1122 Type 2 diabetes mellitus with diabetic chronic kidney disease: Secondary | ICD-10-CM | POA: Diagnosis not present

## 2021-02-17 DIAGNOSIS — Z794 Long term (current) use of insulin: Secondary | ICD-10-CM | POA: Diagnosis not present

## 2021-02-17 DIAGNOSIS — Z6824 Body mass index (BMI) 24.0-24.9, adult: Secondary | ICD-10-CM | POA: Diagnosis not present

## 2021-02-17 DIAGNOSIS — E1165 Type 2 diabetes mellitus with hyperglycemia: Secondary | ICD-10-CM | POA: Diagnosis not present

## 2021-02-17 DIAGNOSIS — I25810 Atherosclerosis of coronary artery bypass graft(s) without angina pectoris: Secondary | ICD-10-CM | POA: Diagnosis not present

## 2021-02-17 DIAGNOSIS — I10 Essential (primary) hypertension: Secondary | ICD-10-CM | POA: Diagnosis not present

## 2021-03-02 ENCOUNTER — Other Ambulatory Visit: Payer: Self-pay | Admitting: Cardiovascular Disease

## 2021-03-11 IMAGING — US ULTRASOUND FNA BIOPSY THYROID 1ST LESION
1 series · 13 of 15 positions shown · non-contrast
Comparison: Prior thyroid ultrasound 08/23/2018

MEDICATIONS:
None

COMPLICATIONS:
None immediate.

INDICATION: Indeterminate thyroid nodule

EXAM:
ULTRASOUND GUIDED FINE NEEDLE ASPIRATION OF INDETERMINATE THYROID
NODULE
TECHNIQUE: Informed written consent was obtained from the patient after a
discussion of the risks, benefits and alternatives to treatment.
Questions regarding the procedure were encouraged and answered. A
timeout was performed prior to the initiation of the procedure.

[Series 1: ultrasound fna biopsy thyroid 1st lesion · 0.08mm/px · 15 acquisitions, 13 frames shown]
[im 1/15]
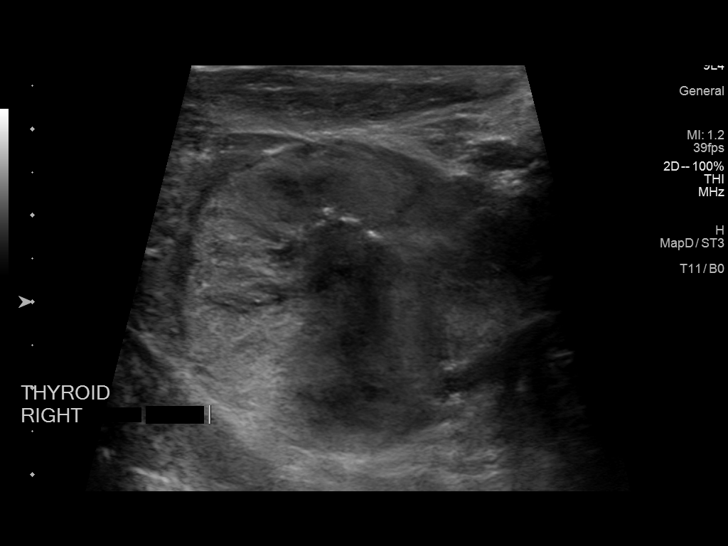
[im 2/15]
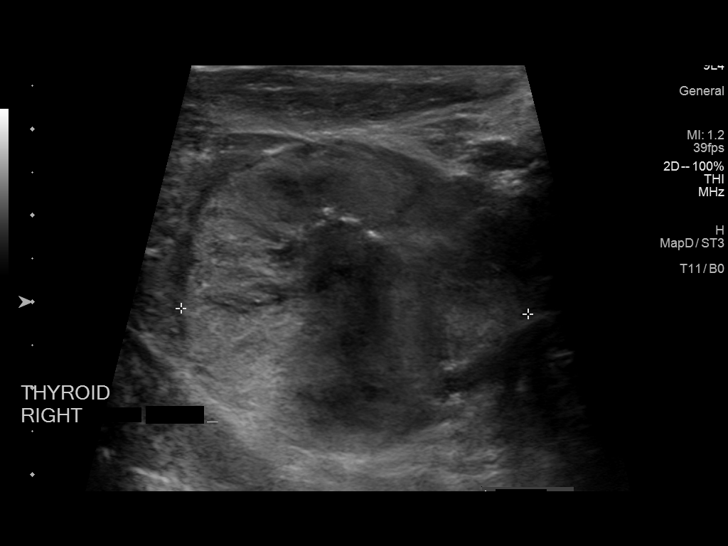
[im 3/15]
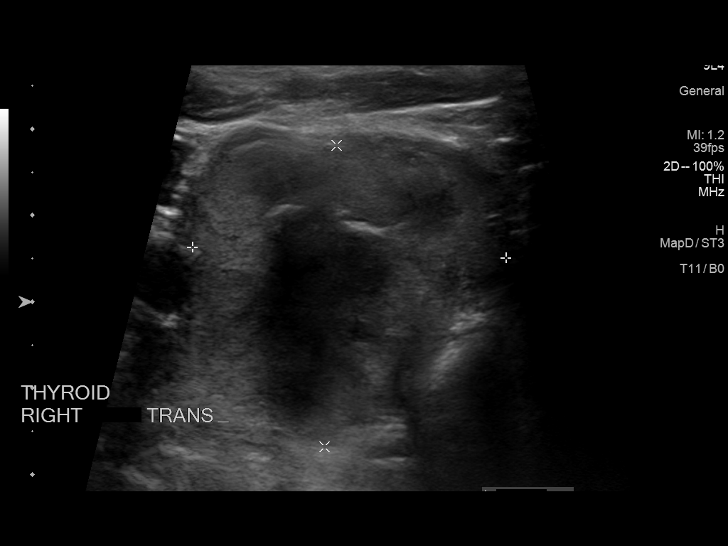
[im 5/15]
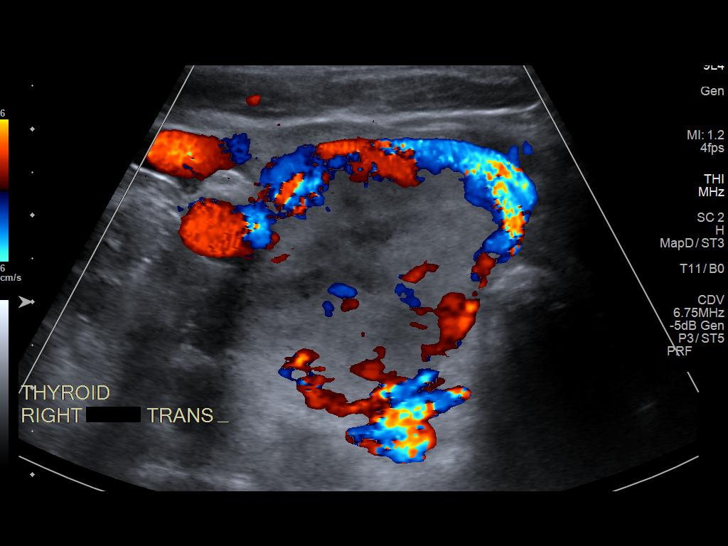
[im 6/15]
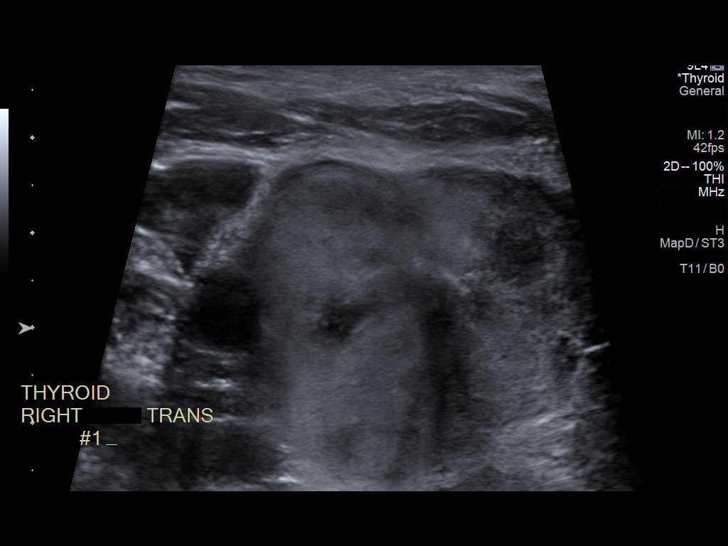
[im 7/15]
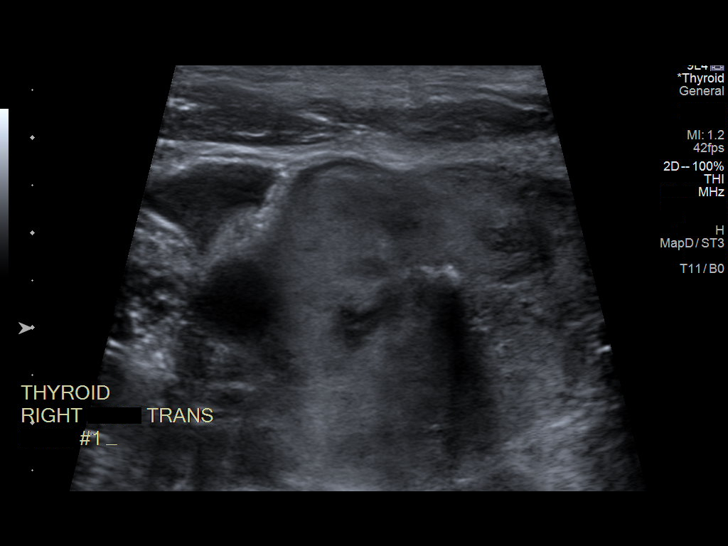
[im 8/15]
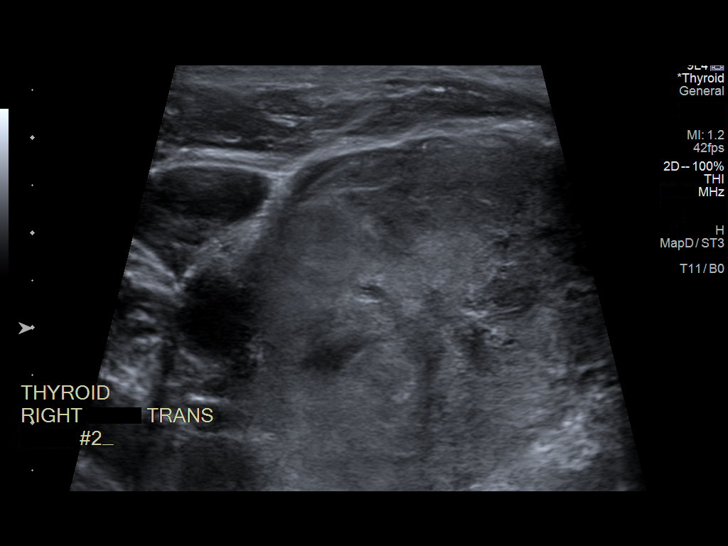
[im 9/15]
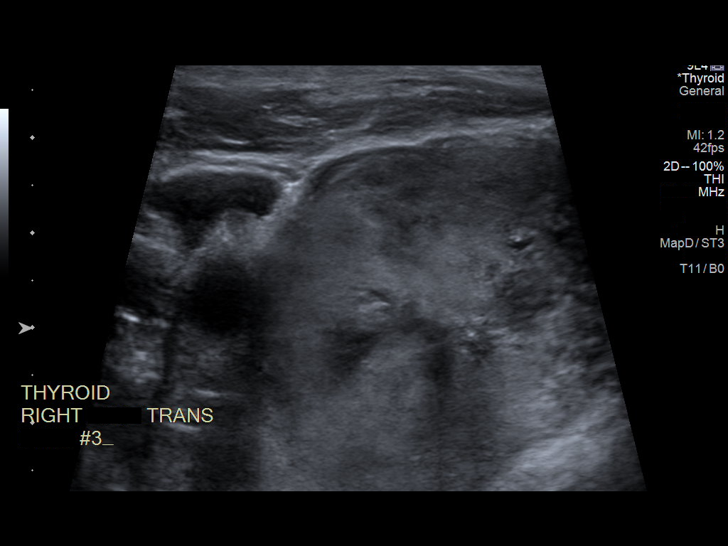
[im 10/15]
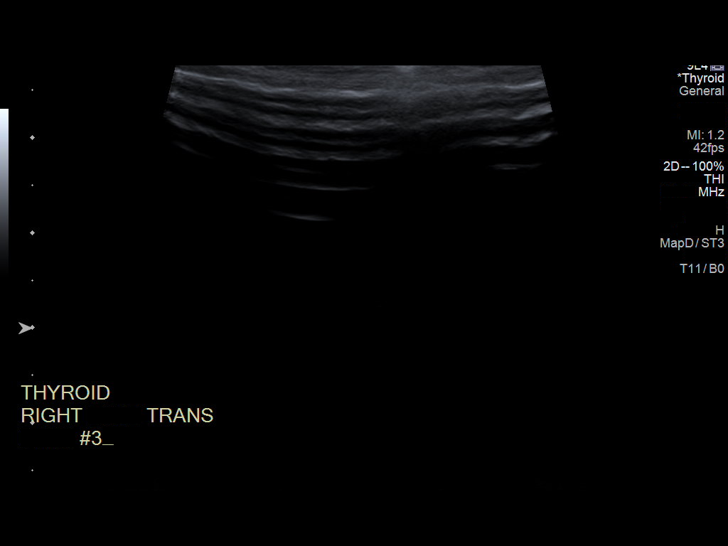
[im 11/15]
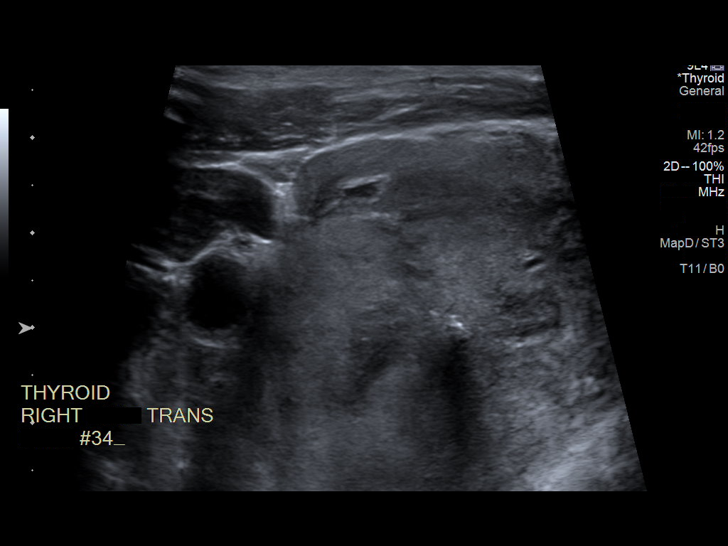
[im 13/15]
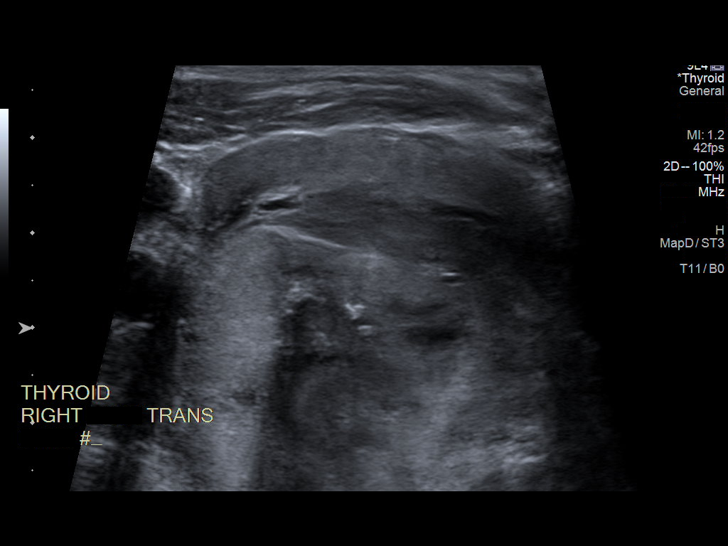
[im 14/15]
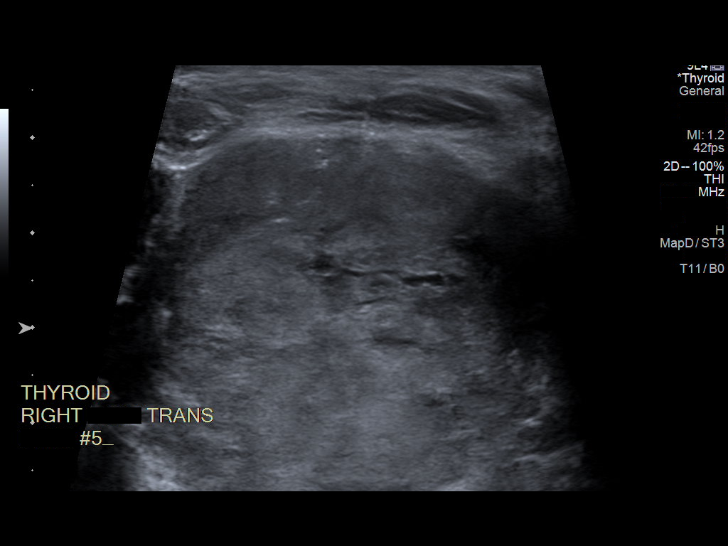
[im 15/15]
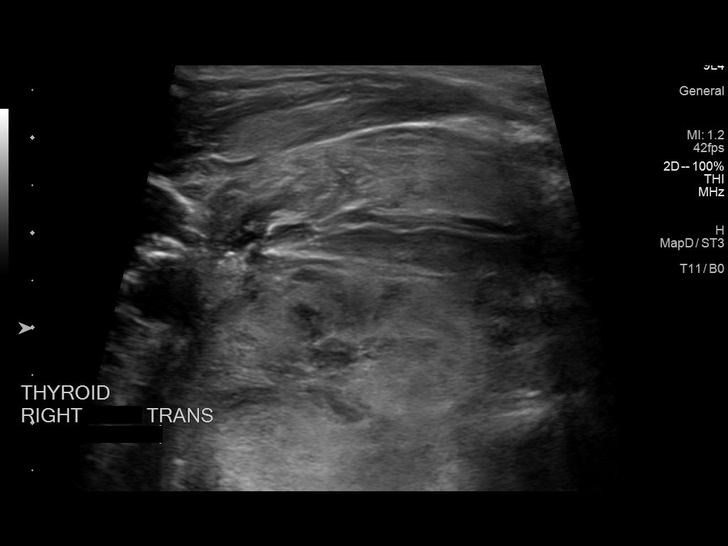

[13 of 15 positions shown; findings below may reference images not displayed]

Pre-procedural ultrasound scanning demonstrated unchanged size and
appearance of the indeterminate nodule within the right mid gland

The procedure was planned. The neck was prepped in the usual sterile
fashion, and a sterile drape was applied covering the operative
field. A timeout was performed prior to the initiation of the
procedure. Local anesthesia was provided with 1% lidocaine.

Under direct ultrasound guidance, 5 FNA biopsies were performed of
the nodule with a 25 gauge needle. Two needle passes were reserved
for Afirma testing. Multiple ultrasound images were saved for
procedural documentation purposes. The samples were prepared and
submitted to pathology.

Limited post procedural scanning was negative for hematoma or
additional complication. Dressings were placed. The patient
tolerated the above procedures procedure well without immediate
postprocedural complication.
FINDINGS: Nodule reference number based on prior diagnostic ultrasound: 1

Maximum size: 3.3 cm

Location: Right; Mid

ACR TI-RADS risk category: TR4 (4-6 points)

Reason for biopsy: meets ACR TI-RADS criteria

Ultrasound imaging confirms appropriate placement of the needles
within the thyroid nodule.
IMPRESSION: Technically successful ultrasound guided fine needle aspiration of
right mid thyroid nodule.

## 2021-03-17 ENCOUNTER — Telehealth: Payer: Self-pay | Admitting: Pharmacist

## 2021-03-17 NOTE — Progress Notes (Addendum)
Chronic Care Management Pharmacy Assistant   Name: [name]  MRN: [id number] DOB: 1946/01/05    Reason for Encounter: Disease State/ Hypertension Assessment Call   Conditions to be addressed/monitored: HTN  Recent office visits:  None  Recent consult visits:  None  Hospital visits:  None in previous 6 months  Medications: Outpatient Encounter Medications as of 03/17/2021  Medication Sig Note   amLODipine (NORVASC) 10 MG tablet TAKE ONE-HALF TABLET BY  MOUTH DAILY    aspirin 81 MG tablet Take 81 mg by mouth daily. 09/08/2017: Took 4 - 81 mg tablets today    Insulin Glargine (TOUJEO SOLOSTAR [place]) Inject 5 Units into the skin. Prior to meals    ipratropium (ATROVENT) 0.06 % nasal spray Place 2 sprays into both nostrils 4 (four) times daily.    LANTUS SOLOSTAR 100 UNIT/ML Solostar Pen INJECT SUBCUTANEOUSLY 40  UNITS DAILY (Patient taking differently: Inject 16 Units into the skin daily.)    losartan (COZAAR) 50 MG tablet Take 1 tablet (50 mg total) by mouth 2 (two) times daily.    nitroGLYCERIN (NITROSTAT) 0.4 MG SL tablet Place 1 tablet (0.4 mg total) under the tongue every 5 (five) minutes as needed for chest pain. (Patient not taking: Reported on 11/13/2020)    ondansetron (ZOFRAN) 4 MG tablet Take 1 tablet (4 mg total) by mouth daily as needed for nausea or vomiting.    ONETOUCH ULTRA test strip CHECK BLOOD SUGAR 3 TIMES A DAY OR AS DIRECTED    pantoprazole (PROTONIX) 40 MG tablet TAKE 1 TABLET BY MOUTH  DAILY    rosuvastatin (CRESTOR) 10 MG tablet TAKE 1 TABLET BY MOUTH 3  TIMES WEEKLY    ULTICARE MINI PEN NEEDLES 31G X 6 MM MISC USE AS DIRECTED    No facility-administered encounter medications on file as of 03/17/2021.  Reviewed chart prior to disease state call. Spoke with patient regarding BP  Recent Office Vitals: BP Readings from Last 3 Encounters:  02/04/21 (!) 153/70  10/31/20 132/76  10/13/20 140/72   Pulse Readings from Last 3 Encounters:  02/04/21 (!) 57   10/31/20 73  10/13/20 66    Wt Readings from Last 3 Encounters:  02/04/21 168 lb 12.8 oz (76.6 kg)  10/31/20 169 lb 9.6 oz (76.9 kg)  10/13/20 169 lb (76.7 kg)     Kidney Function Lab Results  Component Value Date/Time   CREATININE 1.23 10/16/2020 08:37 AM   CREATININE 1.19 (H) 04/11/2020 10:49 AM   CREATININE 1.28 (H) 07/16/2019 11:59 AM   CREATININE 1.03 04/03/2014 04:48 PM   GFR 64.41 07/03/2018 04:37 PM   GFRNONAA 60 04/11/2020 10:49 AM   GFRAA 69 04/11/2020 10:49 AM    BMP Latest Ref Rng & Units 10/16/2020 04/11/2020 07/16/2019  Glucose 65 - 99 mg/dL 129(H) 184(H) 195(H)  BUN 8 - 27 mg/dL '14 16 12  '$ Creatinine 0.76 - 1.27 mg/dL 1.23 1.19(H) 1.28(H)  BUN/Creat Ratio 10 - '24 11 13 '$ 9(L)  Sodium 134 - 144 mmol/L 140 139 140  Potassium 3.5 - 5.2 mmol/L 4.1 4.3 4.2  Chloride 96 - 106 mmol/L 102 102 101  CO2 20 - 29 mmol/L '24 25 24  '$ Calcium 8.6 - 10.2 mg/dL 9.4 9.6 9.7    Current antihypertensive regimen:  Amlodipine 5 mg 1 tablet daily - in AM Losartan 50 mg 1 tablet twice daily  Notes:  03-17-2021 Call to patient he advised he was driving and would be in [place] for most of he  day today and asked that I give him a call back on tomorrow  03-19-2021 Call to patient he reported he was changing his oil and under the car will try back this afternoon  03-24-2021 Attempted to reach patient again left message  03-26-2021 call to patient spoke to wife she reports he was unavailable to try back on Monday afternoon.   03-31-2021 Call to patient spoke to his wife she reports he has not started back taking the losartan nor checking his pressures as it causes his stomach upset. Pharmacist made aware  04-06-21 Per Jeni Salles call to patients cardiology office (Dr Sallyanne Kuster) to make cardiologist aware patient is non compliant with this medication due to stomach upset, left message with his assistant.  Care Gaps: Zoster Vaccine - Overdue Foot exam - Overdue HgbA1C- Overdue 07-07-21 Next  CPP visit call 10-2020- Annual Wellness Visit done   Star Rating Drugs: Losartan  '50mg'$  - Last filled 8-5-202 90DS at [facility] Rosuvastatin '10mg'$  - Last filled 01-06-2021 90DS at [place] Pharmacist Assistant [phone]

## 2021-04-06 ENCOUNTER — Telehealth: Payer: Self-pay | Admitting: Cardiovascular Disease

## 2021-04-06 NOTE — Telephone Encounter (Signed)
Pt c/o medication issue:  1. Name of Medication:  losartan (COZAAR) 50 MG tablet  2. How are you currently taking this medication (dosage and times per day)?  Not currently taking   3. Are you having a reaction (difficulty breathing--STAT)? Yes, he was   4. What is your medication issue? States [name] has not been compliant with medication for the past 3-4 months due to an upset stomach.

## 2021-04-06 NOTE — Telephone Encounter (Signed)
Called and spoke with the patient and his wife. They stated that the patient has not been on the Losartan in over a year due to an upset stomach. They were advised that he was last seen in April and it was not mentioned that he no longer takes the Losartan. The wife stated that he has not been on it and would like it taken off of his list. She stated that his blood pressures are fine but could not give any specific readings.

## 2021-04-16 ENCOUNTER — Telehealth: Payer: Self-pay | Admitting: Pharmacist

## 2021-04-16 NOTE — Chronic Care Management (AMB) (Signed)
Chronic Care Management Pharmacy Assistant   Name: [name]  MRN: [id number] DOB: 1946-02-08  Reason for Encounter: Disease State / Hypertension Assessment Call   Conditions to be addressed/monitored: HTN  Recent office visits:  None  Recent consult visits:  None  Hospital visits:  None in previous 6 months  Medications: Outpatient Encounter Medications as of 04/16/2021  Medication Sig Note   amLODipine (NORVASC) 10 MG tablet TAKE ONE-HALF TABLET BY  MOUTH DAILY    aspirin 81 MG tablet Take 81 mg by mouth daily. 09/08/2017: Took 4 - 81 mg tablets today    Insulin Glargine (TOUJEO SOLOSTAR [place]) Inject 5 Units into the skin. Prior to meals    ipratropium (ATROVENT) 0.06 % nasal spray Place 2 sprays into both nostrils 4 (four) times daily.    LANTUS SOLOSTAR 100 UNIT/ML Solostar Pen INJECT SUBCUTANEOUSLY 40  UNITS DAILY (Patient taking differently: Inject 16 Units into the skin daily.)    losartan (COZAAR) 50 MG tablet Take 1 tablet (50 mg total) by mouth 2 (two) times daily. (Patient not taking: Reported on 04/06/2021)    nitroGLYCERIN (NITROSTAT) 0.4 MG SL tablet Place 1 tablet (0.4 mg total) under the tongue every 5 (five) minutes as needed for chest pain. (Patient not taking: Reported on 11/13/2020)    ondansetron (ZOFRAN) 4 MG tablet Take 1 tablet (4 mg total) by mouth daily as needed for nausea or vomiting.    ONETOUCH ULTRA test strip CHECK BLOOD SUGAR 3 TIMES A DAY OR AS DIRECTED    pantoprazole (PROTONIX) 40 MG tablet TAKE 1 TABLET BY MOUTH  DAILY    rosuvastatin (CRESTOR) 10 MG tablet TAKE 1 TABLET BY MOUTH 3  TIMES WEEKLY    ULTICARE MINI PEN NEEDLES 31G X 6 MM MISC USE AS DIRECTED    No facility-administered encounter medications on file as of 04/16/2021.   Fill History: Lantus Solostar U-100 Insulin 100 unit/mL (3 mL) subcutaneous pen 02/17/2021 30   LOSARTAN POTASSIUM  50 MG TABS 02/13/2021 90   PANTOPRAZOLE SODIUM  40 MG TBEC 01/06/2021 90   ROSUVASTATIN  CALCIUM  10 MG TABS 01/06/2021 90  Reviewed chart prior to disease state call. Spoke with patient regarding BP  Recent Office Vitals: BP Readings from Last 3 Encounters:  02/04/21 (!) 153/70  10/31/20 132/76  10/13/20 140/72   Pulse Readings from Last 3 Encounters:  02/04/21 (!) 57  10/31/20 73  10/13/20 66    Wt Readings from Last 3 Encounters:  02/04/21 168 lb 12.8 oz (76.6 kg)  10/31/20 169 lb 9.6 oz (76.9 kg)  10/13/20 169 lb (76.7 kg)     Kidney Function Lab Results  Component Value Date/Time   CREATININE 1.23 10/16/2020 08:37 AM   CREATININE 1.19 (H) 04/11/2020 10:49 AM   CREATININE 1.28 (H) 07/16/2019 11:59 AM   CREATININE 1.03 04/03/2014 04:48 PM   GFR 64.41 07/03/2018 04:37 PM   GFRNONAA 60 04/11/2020 10:49 AM   GFRAA 69 04/11/2020 10:49 AM    BMP Latest Ref Rng & Units 10/16/2020 04/11/2020 07/16/2019  Glucose 65 - 99 mg/dL 129(H) 184(H) 195(H)  BUN 8 - 27 mg/dL 14 16 12   Creatinine 0.76 - 1.27 mg/dL 1.23 1.19(H) 1.28(H)  BUN/Creat Ratio 10 - 24 11 13  9(L)  Sodium 134 - 144 mmol/L 140 139 140  Potassium 3.5 - 5.2 mmol/L 4.1 4.3 4.2  Chloride 96 - 106 mmol/L 102 102 101  CO2 20 - 29 mmol/L 24 25 24   Calcium  8.6 - 10.2 mg/dL 9.4 9.6 9.7    Current antihypertensive regimen:  Amlodipine 10mg  take 1/2 tablet daily Losartan 50 mg take 1 tablet twice daily   Adherence Review: Is the patient currently on ACE/ARB medication? Yes Does the patient have >5 day gap between last estimated fill dates? No  Notes:  Attempted to contact patient 3 times. The first attempt the patient stated he was too busy to talk and to call back, 2nd attempt [facility], 3rd attempt the patient states he is out of town on vacation to call him next month.  Added patient to panel for next month.   Care Gaps: Zoster Vaccine - Overdue Foot exam - Overdue HgbA1C- Overdue AWV done 10/13/2020 Last BP reading - 153/70 on 02/04/2021  Star Rating Drugs: Losartan  50mg  - Last filled 8-5-202 90DS at  [facility] Rosuvastatin 10mg  - Last filled 01-06-2021 90DS at [place] Pharmacist Assistant [phone]

## 2021-04-28 ENCOUNTER — Ambulatory Visit (INDEPENDENT_AMBULATORY_CARE_PROVIDER_SITE_OTHER): Payer: Medicare Other

## 2021-04-28 ENCOUNTER — Other Ambulatory Visit: Payer: Self-pay

## 2021-04-28 ENCOUNTER — Ambulatory Visit: Payer: Medicare Other

## 2021-04-28 DIAGNOSIS — Z23 Encounter for immunization: Secondary | ICD-10-CM | POA: Diagnosis not present

## 2021-05-15 ENCOUNTER — Telehealth: Payer: Self-pay | Admitting: Pharmacist

## 2021-05-15 NOTE — Chronic Care Management (AMB) (Signed)
Chronic Care Management Pharmacy Assistant   Name: [name]  MRN: [id number] DOB: [birth year]  Reason for Encounter: Disease State / Hypertension Assessment Call   Conditions to be addressed/monitored: HTN  Recent office visits:  None    Recent consult visits:  None  Hospital visits:  None  Medications: Outpatient Encounter Medications as of 05/15/2021  Medication Sig Note   amLODipine (NORVASC) 10 MG tablet TAKE ONE-HALF TABLET BY  MOUTH DAILY    aspirin 81 MG tablet Take 81 mg by mouth daily. 09/08/2017: Took 4 - 81 mg tablets today    Insulin Glargine (TOUJEO SOLOSTAR [place]) Inject 5 Units into the skin. Prior to meals    ipratropium (ATROVENT) 0.06 % nasal spray Place 2 sprays into both nostrils 4 (four) times daily.    LANTUS SOLOSTAR 100 UNIT/ML Solostar Pen INJECT SUBCUTANEOUSLY 40  UNITS DAILY (Patient taking differently: Inject 16 Units into the skin daily.)    losartan (COZAAR) 50 MG tablet Take 1 tablet (50 mg total) by mouth 2 (two) times daily. (Patient not taking: Reported on 04/06/2021)    nitroGLYCERIN (NITROSTAT) 0.4 MG SL tablet Place 1 tablet (0.4 mg total) under the tongue every 5 (five) minutes as needed for chest pain. (Patient not taking: Reported on 11/13/2020)    ondansetron (ZOFRAN) 4 MG tablet Take 1 tablet (4 mg total) by mouth daily as needed for nausea or vomiting.    ONETOUCH ULTRA test strip CHECK BLOOD SUGAR 3 TIMES A DAY OR AS DIRECTED    pantoprazole (PROTONIX) 40 MG tablet TAKE 1 TABLET BY MOUTH  DAILY    rosuvastatin (CRESTOR) 10 MG tablet TAKE 1 TABLET BY MOUTH 3  TIMES WEEKLY    ULTICARE MINI PEN NEEDLES 31G X 6 MM MISC USE AS DIRECTED    No facility-administered encounter medications on file as of 05/15/2021.   Fill History: Lantus Solostar U-100 Insulin 100 unit/mL (3 mL) subcutaneous pen 02/17/2021 30   LOSARTAN POTASSIUM  50 MG TABS 02/13/2021 90   PANTOPRAZOLE SODIUM  40 MG TBEC 01/06/2021 90   ROSUVASTATIN CALCIUM  10 MG TABS  01/06/2021 90    Reviewed chart prior to disease state call. Spoke with patient regarding BP  Recent Office Vitals: BP Readings from Last 3 Encounters:  02/04/21 (!) 153/70  10/31/20 132/76  10/13/20 140/72   Pulse Readings from Last 3 Encounters:  02/04/21 (!) 57  10/31/20 73  10/13/20 66    Wt Readings from Last 3 Encounters:  02/04/21 168 lb 12.8 oz (76.6 kg)  10/31/20 169 lb 9.6 oz (76.9 kg)  10/13/20 169 lb (76.7 kg)     Kidney Function Lab Results  Component Value Date/Time   CREATININE 1.23 10/16/2020 08:37 AM   CREATININE 1.19 (H) 04/11/2020 10:49 AM   CREATININE 1.28 (H) 07/16/2019 11:59 AM   CREATININE 1.03 04/03/2014 04:48 PM   GFR 64.41 07/03/2018 04:37 PM   GFRNONAA 60 04/11/2020 10:49 AM   GFRAA 69 04/11/2020 10:49 AM    BMP Latest Ref Rng & Units 10/16/2020 04/11/2020 07/16/2019  Glucose 65 - 99 mg/dL 129(H) 184(H) 195(H)  BUN 8 - 27 mg/dL 14 16 12   Creatinine 0.76 - 1.27 mg/dL 1.23 1.19(H) 1.28(H)  BUN/Creat Ratio 10 - 24 11 13  9(L)  Sodium 134 - 144 mmol/L 140 139 140  Potassium 3.5 - 5.2 mmol/L 4.1 4.3 4.2  Chloride 96 - 106 mmol/L 102 102 101  CO2 20 - 29 mmol/L 24 25 24   Calcium  8.6 - 10.2 mg/dL 9.4 9.6 9.7    Current antihypertensive regimen:  Amlodipine 10mg  take 1/2 tablet daily Losartan 50 mg take 1 tablet twice daily, per patient he has not been taking this also see note in chart dated 04/06/2021.  How often are you checking your Blood Pressure? Every other day  Current home BP readings: Patient states he is not at home however, his readings are around 130/80 and occasionally slightly higher.  What recent interventions/DTPs have been made by any provider to improve Blood Pressure control since last CPP Visit: None  Any recent hospitalizations or ED visits since last visit with CPP? No  What diet changes have been made to improve Blood Pressure Control?  Patient states his diet is very low carb and generally meats and vegetables and some  fruits.  What exercise is being done to improve your Blood Pressure Control?  Patient states he walks 2-3 miles daily, doing yard work and working around his house.  Adherence Review: Is the patient currently on ACE/ARB medication? Yes Does the patient have >5 day gap between last estimated fill dates? No  Notes: Patient also mentions he is no longer taking Toujeo, he is now taking Protonix 3 times per week and has increased Rosuvastatin to 6 days per week.  Care Gaps: Zoster Vaccine - Never done Foot exam - Overdue HgbA1C- Overdue Covid vaccine - overdue AWV done 10/13/2020 Last BP reading - 153/70 on 02/04/2021  Star Rating Drugs: Losartan  50mg  - Last filled 05/06/2021 90DS at [facility] Rosuvastatin 10mg  - Last filled 04/01/2021 90DS at [place] fill dates verified with Caren Griffins at [place] Pharmacist Assistant [phone]

## 2021-05-20 ENCOUNTER — Telehealth (INDEPENDENT_AMBULATORY_CARE_PROVIDER_SITE_OTHER): Payer: Medicare Other | Admitting: Family Medicine

## 2021-05-20 ENCOUNTER — Encounter: Payer: Self-pay | Admitting: Family Medicine

## 2021-05-20 VITALS — Ht 69.0 in

## 2021-05-20 DIAGNOSIS — R110 Nausea: Secondary | ICD-10-CM | POA: Diagnosis not present

## 2021-05-20 DIAGNOSIS — R55 Syncope and collapse: Secondary | ICD-10-CM | POA: Diagnosis not present

## 2021-05-20 DIAGNOSIS — J069 Acute upper respiratory infection, unspecified: Secondary | ICD-10-CM

## 2021-05-20 MED ORDER — FLUTICASONE PROPIONATE 50 MCG/ACT NA SUSP: 1.0000 | Freq: Two times a day (BID) | NASAL | 0 refills | Status: DC

## 2021-05-20 MED ORDER — ONDANSETRON HCL 4 MG PO TABS: 4.0000 mg | ORAL_TABLET | Freq: Every day | ORAL | 0 refills | Status: AC | PRN

## 2021-05-20 MED ORDER — BENZONATATE 100 MG PO CAPS: 200.0000 mg | ORAL_CAPSULE | Freq: Two times a day (BID) | ORAL | 0 refills | Status: AC | PRN

## 2021-05-20 NOTE — Progress Notes (Signed)
Virtual Visit via Telephone Note I connected with [name] on 05/20/21 at  5:00 PM EST by telephone and verified that I am speaking with the correct person using two identifiers.   I discussed the limitations, risks, security and privacy concerns of performing an evaluation and management service by telephone and the availability of in person appointments. I also discussed with the patient that there may be a patient responsible charge related to this service. The patient expressed understanding and agreed to proceed.  Location patient: home Location provider: work office Participants present for the call: patient, provider Patient did not have a visit in the prior 7 days to address this/these issue(s).  Chief Complaint  Patient presents with   flu like symptoms    Fever, started on Sunday. Cough & sneezing    History of Present Illness: [name] is a 74 y.o.male with hx of DM 2, hypertension, GERD, anxiety, atrial fibrillation, and PAD complaining of 3 days of respiratory symptoms. Fatigue, chills, headache,body aches,nasal congestion, rhinorrhea, sore throat, sneezing, productive cough with clear sputum, and nausea. He stayed in bed for 2 days. Denies hemoptysis. Symptoms have improved. Denies anosmia, ageusia, vomiting, abdominal pain, changes in bowel habits, urinary symptoms, or a skin rash.  No known sick contact, his wife just started with similar symptoms yesterday. COVID-19 vaccination completed. Flu vaccine a month ago. No recent travel. He is taking DayQuil and NyQuil.  Reporting an episode of syncope yesterday when she was walking to his room, he had a "bad cough" and could not stop to take breathe.  He is not sure for how long he was on the floor.  Denies CP, palpitation, dyspnea, diaphoresis, focal neurologic deficit, or confusion. Denies head trauma or seizure like activity.  He did not seek immediate medical attention. He is requesting Zofran for nausea, he  has taken it for a while. DM II: BS's are "good", 120-130's. States that he is feeling much better today.  Observations/Objective: Patient sounds cheerful and well on the phone. I do not appreciate any SOB or wheezing, productive cough x 1. Speech and thought processing are grossly intact. Patient reported vitals:Ht 5\' 9"  (1.753 m)   BMI 24.93 kg/m   Assessment and Plan:  1. Nausea without vomiting He is requesting another prescription for Zofran, he has taken it in the past for similar problem.  He also requested a prescription for his wife. We discussed some side effects of medication, including QT prolongation, he voices understanding. Recommend not to take it if nausea resolves. Adequate hydration. Instructed about warning signs.  - ondansetron (ZOFRAN) 4 MG tablet; Take 1 tablet (4 mg total) by mouth daily as needed for up to 5 days for nausea or vomiting.  Dispense: 5 tablet; Refill: 0  2. URI, acute Symptoms suggest a viral infection,improving. He does not want to come to the office for COVID 19 or flu test. Continue symptomatic treatment: Adequate hydration, rest, if needed Tylenol 500 mg 3-4 times per day. Instructed to monitor for signs of complications, including new onset of fever among some, clearly instructed about warning signs. I also explained that cough and nasal congestion can last a few days and sometimes weeks. Flonase nasal spray once daily for 10 to 14 days, nasal saline irrigations, plain mucinex,and benzonatate may help with nasal/sinus congestion and cough. F/U as needed.  - benzonatate (TESSALON) 100 MG capsule; Take 2 capsules (200 mg total) by mouth 2 (two) times daily as needed for up to 10  days.  Dispense: 30 capsule; Refill: 0  3. Syncope, unspecified syncope type History suggest vasovagal syncope. He is not concerned and not interested in further work up. He was clearly instructed about warning signs. Instructed to call 911 if it happens  again.  Follow Up Instructions:  Return if symptoms worsen or fail to improve.  I did not refer this patient for an OV in the next 24 hours for this/these issue(s).  We discussed possible serious and likely etiologies, options for evaluation and workup, limitations of telemedicine visit vs in person visit, treatment, treatment risks and precautions. I discussed the assessment and treatment plan with the patient. Mr Stucke was provided an opportunity to ask questions and all were answered. He greed with the plan and demonstrated an understanding of the instructions.   The patient was advised to call back or seek an in-person evaluation if the symptoms worsen or if the condition fails to improve as anticipated.  I provided 13 minutes of non-face-to-face time during this encounter.

## 2021-07-02 ENCOUNTER — Telehealth: Payer: Self-pay | Admitting: Pharmacist

## 2021-07-02 NOTE — Chronic Care Management (AMB) (Signed)
° ° °  Chronic Care Management Pharmacy Assistant   Name: [name]  MRN: [id number] DOB: 03-Sep-1945  07/07/2021 APPOINTMENT REMINDER   Called [name], No answer, both cell and home phones are not accepting calls at this time, unable to leave a message of appointment on 07/07/2021 at 3:00 via telephone visit with Jeni Salles, Pharm D.   Care Gaps: Zoster Vaccine - Never done Foot exam - Overdue HgbA1C- Overdue Covid vaccine - overdue AWV done 10/13/2020 Last BP reading - 153/70 on 02/04/2021  Star Rating Drug: Losartan  50mg  - Last filled 05/06/2021 90DS at [facility] Rosuvastatin 10mg  - Last filled 04/01/2021 90DS at [place] fill dates verified with Caren Griffins at [facility]  Any gaps in medications fill history?  [place] Pharmacist Assistant [phone]

## 2021-07-06 NOTE — Progress Notes (Deleted)
Chronic Care Management Pharmacy Note  07/06/2021 Name:  [name] MRN:  [id number] DOB:  01-13-1946  Summary: BP not at goal < 130/80 per office readings Pt reports more fatigue lately   Recommendations/Changes made from today's visit: -Recommended restarting losartan 50 mg 2 tablets daily and requested refill from cardiologist -Recommended monitoring of BP at home regularly (at least once weekly) -Recommended discussing with endocrinologist about using a CGM   Plan: BP assessment in 4 weeks  Subjective: [name] is an 75 y.o. year old male who is a primary patient of [name], [name], MD.  The CCM team was consulted for assistance with disease management and care coordination needs.    Engaged with patient by telephone for follow up visit in response to provider referral for pharmacy case management and/or care coordination services.   Consent to Services:  The patient was given information about Chronic Care Management services, agreed to services, and gave verbal consent prior to initiation of services.  Please see initial visit note for detailed documentation.   Patient Care Team: [name], [name], MD as PCP - General (Family Medicine) Sanda Klein, MD as PCP - Cardiology (Cardiology) Sanda Klein, MD as Attending Physician (Cardiology) Sanda Klein, MD as Consulting Physician (Cardiology) Ralene Bathe, MD as Consulting Physician (Ophthalmology) Viona Gilmore, [facility] as Pharmacist (Pharmacist)  Recent office visits: 05/20/21 [name], [name], MD (PCP): Patient presented for video visit for nausea without vomiting. Prescribed Zofran PRN and benzonatate PRN.  10/13/20 [name], LPN: Patient presented for Medicare Wellness Exam.  Recent consult visits: 02/17/21 [name] Endocrinology: Patient presented for DM follow up.   02/04/21 [name], MD (vasc surgery): Patient presented for status post EVAR.  12/17/20 [name], Sophia (emergeortho):  Patient presented for left hip pain. Unable to access notes.  10/31/20 Croitoru, Dani Gobble, MD Cardiology: Patient presented for CAD follow up. Increased rosuvastatin 10 mg to 6 times a week.   10/28/20 [name], MD Otolaryngology: Patient presented for sensorineural hearing loss (SNHL) of both ears.  Hospital visits: None in previous 6 months  Objective:  Lab Results  Component Value Date   CREATININE 1.23 10/16/2020   BUN 14 10/16/2020   GFR 64.41 07/03/2018   GFRNONAA 60 04/11/2020   GFRAA 69 04/11/2020   NA 140 10/16/2020   K 4.1 10/16/2020   CALCIUM 9.4 10/16/2020   CO2 24 10/16/2020   GLUCOSE 129 (H) 10/16/2020    Lab Results  Component Value Date/Time   HGBA1C 8.6 (H) 12/25/2018 08:37 AM   HGBA1C 7.5 (A) 07/03/2018 03:58 PM   HGBA1C 6.6 (A) 01/10/2018 02:59 PM   HGBA1C 6.3 06/10/2017 12:39 PM   HGBA1C 6.6 06/15/2016 12:00 AM   FRUCTOSAMINE 236 12/07/2016 11:37 AM   GFR 64.41 07/03/2018 04:37 PM   GFR 53.81 (L) 03/21/2018 12:02 PM   MICROALBUR 2.2 (H) 01/10/2018 03:09 PM   MICROALBUR 1.6 08/16/2016 03:20 PM    Last diabetic Eye exam:  Lab Results  Component Value Date/Time   HMDIABEYEEXA No Retinopathy 11/10/2020 12:00 AM    Last diabetic Foot exam: No results found for: HMDIABFOOTEX   Lab Results  Component Value Date   CHOL 161 10/16/2020   HDL 29 (L) 10/16/2020   LDLCALC 92 10/16/2020   LDLDIRECT 74.0 12/28/2016   TRIG 234 (H) 10/16/2020   CHOLHDL 5.6 (H) 10/16/2020    Hepatic Function Latest Ref Rng & Units 04/11/2020 12/25/2018 06/15/2016  Total Protein 6.1 - 8.1 g/dL 6.9  7.0 -  Albumin 3.7 - 4.7 g/dL - 4.5 -  AST 10 - 35 U/L 32 48(H) 34  ALT 9 - 46 U/L 13 19 12   Alk Phosphatase 39 - 117 IU/L - 97 75  Total Bilirubin 0.2 - 1.2 mg/dL 0.8 0.8 -    Lab Results  Component Value Date/Time   TSH 3.12 08/08/2018 04:40 PM   TSH 4.510 (H) 12/20/2017 09:37 AM   FREET4 1.35 12/20/2017 09:37 AM    CBC Latest Ref Rng & Units 03/21/2018 11/28/2017  09/27/2017  WBC 4.0 - 10.5 K/uL 8.8 7.1 8.5  Hemoglobin 13.0 - 17.0 g/dL 15.3 14.4 14.6  Hematocrit 39.0 - 52.0 % 44.4 42.3 40.9  Platelets 150.0 - 400.0 K/uL 267.0 161 189    No results found for: VD25OH  Clinical ASCVD: Yes  The 10-year ASCVD risk score (Arnett DK, et al., 2019) is: 49.5%   Values used to calculate the score:     Age: 75 years     Sex: Male     Is Non-Hispanic African American: No     Diabetic: Yes     Tobacco smoker: No     Systolic Blood Pressure: 921 mmHg     Is BP treated: Yes     HDL Cholesterol: 29 mg/dL     Total Cholesterol: 161 mg/dL    Depression screen [facility] 2/9 02/04/2021 10/13/2020 10/13/2020  Decreased Interest 0 0 0  Down, Depressed, Hopeless 0 0 0  PHQ - 2 Score 0 0 0  Altered sleeping - - -  Tired, decreased energy - - -  Change in appetite - - -  Feeling bad or failure about yourself  - - -  Trouble concentrating - - -  Moving slowly or fidgety/restless - - -  Suicidal thoughts - - -  PHQ-9 Score - - -  Difficult doing work/chores - - -      Social History   Tobacco Use  Smoking Status Former   Types: Cigars   Quit date: 09/09/2017   Years since quitting: 3.8  Smokeless Tobacco Never  Tobacco Comments   states he smoked cigar    BP Readings from Last 3 Encounters:  02/04/21 (!) 153/70  10/31/20 132/76  10/13/20 140/72   Pulse Readings from Last 3 Encounters:  02/04/21 (!) 57  10/31/20 73  10/13/20 66   Wt Readings from Last 3 Encounters:  02/04/21 168 lb 12.8 oz (76.6 kg)  10/31/20 169 lb 9.6 oz (76.9 kg)  10/13/20 169 lb (76.7 kg)   BMI Readings from Last 3 Encounters:  05/20/21 24.93 kg/m  02/04/21 24.93 kg/m  10/31/20 25.05 kg/m    Assessment/Interventions: Review of patient past medical history, allergies, medications, health status, including review of consultants reports, laboratory and other test data, was performed as part of comprehensive evaluation and provision of chronic care management services.   SDOH:   (Social Determinants of Health) assessments and interventions performed: Yes   SDOH Screenings   Alcohol Screen: Low Risk    Last Alcohol Screening Score (AUDIT): 0  Depression (PHQ2-9): Low Risk    PHQ-2 Score: 0  Financial Resource Strain: Medium Risk   Difficulty of Paying Living Expenses: Somewhat hard  Food Insecurity: No Food Insecurity   Worried About Charity fundraiser in the Last Year: Never true   Ran Out of Food in the Last Year: Never true  Housing: Low Risk    Last Housing Risk Score: 0  Physical Activity: Insufficiently  Active   Days of Exercise per Week: 3 days   Minutes of Exercise per Session: 30 min  Social Connections: Socially Integrated   Frequency of Communication with Friends and Family: More than three times a week   Frequency of Social Gatherings with Friends and Family: More than three times a week   Attends Religious Services: More than 4 times per year   Active Member of [employer] or Organizations: Not on file   Attends Archivist Meetings: 1 to 4 times per year   Marital Status: Married  Stress: No Stress Concern Present   Feeling of Stress : Not at all  Tobacco Use: Medium Risk   Smoking Tobacco Use: Former   Smokeless Tobacco Use: Never   Passive Exposure: Not on Pensions consultant Needs: No Transportation Needs   Lack of Transportation (Medical): No   Lack of Transportation (Non-Medical): No     CCM Care Plan  Allergies  Allergen Reactions   Codeine Nausea Only and Other (See Comments)    Cannot take on empty stomach.   Valium Nausea Only and Other (See Comments)    Cannot take on empty stomach.   Empagliflozin Other (See Comments)   Simvastatin Other (See Comments)   Sitagliptin Other (See Comments)    Medications Reviewed Today     Reviewed by [name], [name], MD (Physician) on 05/20/21 at [place] List Status: <None>   Medication Order Taking? Sig Documenting Provider Last Dose Status Informant  amLODipine (NORVASC)  10 MG tablet [id number] Yes TAKE ONE-HALF TABLET BY  MOUTH DAILY Croitoru, Mihai, MD Taking Active   aspirin 81 MG tablet [id number] Yes Take 81 mg by mouth daily. [provider] Taking Active Spouse/Significant Other           Med Note Unk Lightning Sep 08, 2017 12:11 PM) Took 4 - 81 mg tablets today   benzonatate (TESSALON) 100 MG capsule [id number] Yes Take 2 capsules (200 mg total) by mouth 2 (two) times daily as needed for up to 10 days. [name], [name], MD  Active   fluticasone [facility]) 50 MCG/ACT nasal spray [id number] Yes Place 1 spray into both nostrils 2 (two) times daily. [name], [name], MD  Active   Insulin Glargine Nelva Nay [place]) [id number] Yes Inject 5 Units into the skin. Prior to meals [provider] Taking Active   ipratropium (ATROVENT) 0.06 % nasal spray [id number] Yes Place 2 sprays into both nostrils 4 (four) times daily. [name], [name], MD Taking Active   LANTUS SOLOSTAR 100 UNIT/ML Solostar Pen [id number] Yes INJECT SUBCUTANEOUSLY 40  UNITS DAILY  Patient taking differently: Inject 16 Units into the skin daily.   [name], [name], MD Taking Active   losartan (COZAAR) 50 MG tablet [id number] Yes Take 1 tablet (50 mg total) by mouth 2 (two) times daily. Croitoru, Mihai, MD Taking Active   nitroGLYCERIN (NITROSTAT) 0.4 MG SL tablet [id number]  Place 1 tablet (0.4 mg total) under the tongue every 5 (five) minutes as needed for chest pain.  Patient not taking: Reported on 11/13/2020   Croitoru, Dani Gobble, MD  Expired 10/15/19 2359   ondansetron (ZOFRAN) 4 MG tablet [id number]  Take 1 tablet (4 mg total) by mouth daily as needed for up to 5 days for nausea or vomiting. [name], [name], MD  Active   [facility] ULTRA test strip [id number] Yes CHECK BLOOD SUGAR 3 TIMES A DAY OR AS DIRECTED [name], [name], MD Taking  Active   pantoprazole (PROTONIX) 40 MG tablet [id number] Yes TAKE 1 TABLET BY MOUTH  DAILY [name], [name], MD Taking Active   rosuvastatin (CRESTOR) 10 MG  tablet [id number] Yes TAKE 1 TABLET BY MOUTH 3  TIMES WEEKLY Croitoru, Mihai, MD Taking Active   ULTICARE MINI PEN NEEDLES 31G X 6 MM MISC [id number] Yes USE AS DIRECTED [name], [name], MD Taking Active             Patient Active Problem List   Diagnosis Date Noted   Thyroid nodule 09/08/2018   Major depressive disorder, recurrent ([place]) 08/08/2018   Tremor of both hands 07/03/2018   Insomnia 07/03/2018   Anxiety disorder 03/21/2018   Nausea without vomiting 01/10/2018   Bradycardia- with decrease of BB some improvement 09/10/2017   S/P angioplasty with stent DES mid SVG to OM and DES to prox. SVG to OM, DAPT for lifetime 09/10/2017   Unstable angina pectoris ([place]) 09/08/2017   Chest pain with moderate risk for cardiac etiology 05/20/2017   Carotid artery disease ([place]) 05/20/2017   Allergic rhinitis 08/16/2016   Chronic combined systolic and diastolic heart failure ([place]) 05/06/2015   Groin pain 08/13/2014   Prolonged Q-T interval on ECG 04/30/2014   Ischemic cardiomyopathy 04/06/2014   Unstable angina (HCC) 04/03/2014   Syncope 02/02/2014   Acute gastroenteritis 02/02/2014   Hypertension    GERD (gastroesophageal reflux disease)    Atrial fibrillation ([place])    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 08/16/2013   Type 2 diabetes mellitus with diabetic neuropathy, unspecified ([place]) 08/16/2013   Hyperlipidemia, mixed 08/16/2013   Aftercare following surgery of the circulatory system, NEC 07/31/2013   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[place]) 07/25/2012    Immunization History  Administered Date(s) Administered   Fluad Quad(high Dose 65+) 04/11/2019, 04/11/2020, 04/28/2021   Influenza, High Dose Seasonal PF 04/30/2014, 04/04/2015, 03/19/2016, 04/15/2017, 04/11/2018   Moderna Sars-Covid-2 Vaccination 08/23/2019, 09/21/2019, 05/20/2020, 01/20/2021   Pneumococcal Conjugate-13 02/24/2015   Pneumococcal Polysaccharide-23 04/11/2020    Tdap 07/12/2012   Zoster, Live 08/07/2013   Patient reported he hasn't filled out Lantus PAP application yet because he had to have an appointment with endo and he canceled his previous one because of his wife.  Patient reports he is feeling tired all the time and has only been checking his BGs in the morning.  Patient also mentions he is no longer taking Toujeo, he is now taking Protonix 3 times per week and has increased Rosuvastatin to 6 days per week.  Conditions to be addressed/monitored:  Hypertension, Hyperlipidemia, Diabetes, Atrial Fibrillation, Coronary Artery Disease and GERD  Conditions addressed this visit: Hypertension, diabetes***  There are no care plans that you recently modified to display for this patient.       Medication Assistance: Application for Lantus  medication assistance program. in process.  Anticipated assistance start date 11/28/20.  See plan of care for additional detail.  Compliance/Adherence/Medication fill history: Care Gaps: Shingrix, foot exam, influenza, A1c, COVID booster Last BP reading - 153/70 on 02/04/2021   Star-Rating Drugs: Losartan  42m - Last filled 05/06/2021 [facility] Rosuvastatin 151m- Last filled 04/01/2021 [facility]DS [facility] dates verified with [facility] preferred pharmacy is:  [place]: [phone]: [phone] ([facility] [place]  [place] [address] [place] [address] Phone: [phone] Fax: [phone]  [facility] ([facility] ) - [place], [place] [address] Phone: [phone] Fax: [phone]   Uses pill box? Yes Pt endorses 100% compliance - only misses it when he goes into appointments  We discussed: Current  pharmacy is preferred with insurance plan and patient is satisfied with pharmacy services Patient decided to: Continue current medication management strategy  Care Plan and Follow Up Patient Decision:  Patient agrees to Care Plan and Follow-up.  Plan: Telephone follow up appointment with care management team member scheduled for:  3 months  Jeni Salles, PharmD [place] Pharmacist [place] at [place] [phone]

## 2021-07-07 ENCOUNTER — Telehealth: Payer: Self-pay | Admitting: Pharmacist

## 2021-07-07 ENCOUNTER — Telehealth: Payer: Medicare Other

## 2021-07-07 NOTE — Telephone Encounter (Signed)
°  Chronic Care Management   Outreach Note  07/07/2021 Name: [name] MRN: [id number] DOB: 1945-11-19  Referred by: [name], [name], MD  Patient had a phone appointment scheduled with clinical pharmacist today.  An unsuccessful telephone outreach was attempted today. The patient was referred to the pharmacist for assistance with care management and care coordination.   If possible, a message was left to return call to: [phone] or to [place] [facility]: [place], PharmD, [place] [facility] [place]

## 2021-07-09 ENCOUNTER — Ambulatory Visit (INDEPENDENT_AMBULATORY_CARE_PROVIDER_SITE_OTHER): Payer: Medicare Other | Admitting: Adult Health

## 2021-07-09 ENCOUNTER — Encounter: Payer: Self-pay | Admitting: Adult Health

## 2021-07-09 VITALS — BP 130/64 | HR 68 | Temp 98.6°F | Ht 69.0 in | Wt 166.0 lb

## 2021-07-09 DIAGNOSIS — H9193 Unspecified hearing loss, bilateral: Secondary | ICD-10-CM

## 2021-07-09 MED ORDER — FLUTICASONE PROPIONATE 50 MCG/ACT NA SUSP: 2.0000 | Freq: Every day | NASAL | 6 refills | Status: DC

## 2021-07-09 NOTE — Progress Notes (Signed)
Subjective:    Patient ID: [name], male    DOB: March 13, 1946, 75 y.o.   MRN: [id number]  HPI 75 year old male who  has a past medical history of Anxiety, Arthritis, Atrial fibrillation ([place]), Blood in stool, Bradycardia- with decrease of BB some improvement (09/10/2017), CAD (coronary artery disease), CAD s/p CABG 2006 (08/16/2013), Diverticulosis, Dizziness, DM type 2 causing complication (HCC) (12/12/9526), GERD (gastroesophageal reflux disease), Hypertension, Iliac artery aneurysm [facility]), Ischemic cardiomyopathy, Joint pain, Kidney stones, Kidney stones, Lower back pain (Jan 2015), Mixed hyperlipidemia (08/16/2013), Myocardial infarction ([place]) (03/2014 X 2?), S/P angioplasty with stent DES mid SVG to OM and DES to prox. SVG to OM, DAPT for lifetime (09/10/2017), and Type II diabetes mellitus ([place]).  He is a patient of Dr. [name] who I am seeing today for chronic bilateral ear fullness. Feels as though he has " fluid behind my ears"   He has a history of hearing issues including multiple sets of tubes and ear infections as a child. His ENT retired and he needs a referral to see another ENT.   He is wearing his wife's hearing aid today so that he can hear   Review of Systems See HPI   Past Medical History:  Diagnosis Date   Anxiety    Arthritis    "back" (04/05/2014)   Atrial fibrillation (HCC)    Blood in stool    Bradycardia- with decrease of BB some improvement 09/10/2017   CAD (coronary artery disease)    CAD s/p CABG 2006 08/16/2013   a. Dr Darcey Nora - to LAD, SVG to diagonal, SVG to OM, SVG to PDA ;  b.  [facility] (9/15): ostial LAD occl, ostial CFX 60%, prox AVCFX 95%, RCA 95%, S-PDA occl, S-OM/Dx with Dx limb occl and prox 60%, L-LAD patent, EF 35-40% wtih ant-lat and apical HK-AK >> PCI:  BMS to native RCA    Diverticulosis    Dizziness    DM type 2 causing complication ([place]) 10/11/3242   GERD (gastroesophageal reflux disease)    Hypertension    Iliac artery aneurysm ([place])    Ischemic  cardiomyopathy    a. EF 35-40% by LV gram 03/2014   Joint pain    Kidney stones    Kidney stones    Lower back pain Jan 2015   Mixed hyperlipidemia 08/16/2013   Myocardial infarction ([place]) 03/2014 X 2?   S/P angioplasty with stent DES mid SVG to OM and DES to prox. SVG to OM, DAPT for lifetime 09/10/2017   Type II diabetes mellitus ([place])     Social History   Socioeconomic History   Marital status: Married    Spouse name: Not on file   Number of children: 3   Years of education: Not on file   Highest education level: Not on file  Occupational History   Not on file  Tobacco Use   Smoking status: Former    Types: Cigars    Quit date: 09/09/2017    Years since quitting: 3.8   Smokeless tobacco: Never   Tobacco comments:    states he smoked cigar   Vaping Use   Vaping Use: Never used  Substance and Sexual Activity   Alcohol use: No   Drug use: No   Sexual activity: Yes  Other Topics Concern   Not on file  Social History Narrative   Lives with wife   Caffeine use: Soda sometimes   1/2 cup coffee in the morning sometimes  Right handed    Social Determinants of Health   Financial Resource Strain: Medium Risk   Difficulty of Paying Living Expenses: Somewhat hard  Food Insecurity: No Food Insecurity   Worried About Running Out of Food in the Last Year: Never true   Ran Out of Food in the Last Year: Never true  Transportation Needs: No Transportation Needs   Lack of Transportation (Medical): No   Lack of Transportation (Non-Medical): No  Physical Activity: Insufficiently Active   Days of Exercise per Week: 3 days   Minutes of Exercise per Session: 30 min  Stress: No Stress Concern Present   Feeling of Stress : Not at all  Social Connections: Socially Integrated   Frequency of Communication with Friends and Family: More than three times a week   Frequency of Social Gatherings with Friends and Family: More than three times a week   Attends Religious Services: More than 4  times per year   Active Member of [employer] or Organizations: Not on file   Attends Archivist Meetings: 1 to 4 times per year   Marital Status: Married  Human resources officer Violence: Not At Risk   Fear of Current or Ex-Partner: No   Emotionally Abused: No   Physically Abused: No   Sexually Abused: No    Past Surgical History:  Procedure Laterality Date   CARDIAC CATHETERIZATION  2006   "before [facility]"   CORONARY ANGIOPLASTY WITH STENT PLACEMENT  04/05/2014   "1"   CORONARY ARTERY BYPASS GRAFT  2006   LIMA to LAD,SVG to diagonal,SVG to obtuse marginal,SVG to posterior descending   CORONARY STENT INTERVENTION N/A 09/09/2017   Procedure: CORONARY STENT INTERVENTION;  Surgeon: Burnell Blanks, MD;  Location: [place] [facility];  Service: Cardiovascular;  Laterality: N/A;   CORONARY STENT PLACEMENT  09/09/2017   CYSTOSCOPY W/ STONE MANIPULATION  1970's X 2   CYSTOSCOPY W/ URETEROSCOPY W/ LITHOTRIPSY  ~ 2009   ILIAC ARTERY STENT Left Aug. 10, 2011   Aorta to right ext iliac and left CIA stent repair    LEFT HEART CATH AND CORS/GRAFTS ANGIOGRAPHY N/A 09/08/2017   Procedure: LEFT HEART CATH AND CORS/GRAFTS ANGIOGRAPHY;  Surgeon: [name], MD;  Location: [place] [facility];  Service: Cardiovascular;  Laterality: N/A;   LEFT HEART CATHETERIZATION WITH CORONARY/GRAFT ANGIOGRAM N/A 04/05/2014   Procedure: LEFT HEART CATHETERIZATION WITH [name];  Surgeon: [name], MD;  [facility];  Service: Cardiovascular;  Laterality: N/A;   NM MYOCAR PERF WALL MOTION  11/18/2009   low risk   TONSILLECTOMY AND ADENOIDECTOMY  1951    Family History  Problem Relation Age of Onset   Diabetes Mother    Hypertension Mother    Heart disease Mother        AAA   Heart disease Sister    Heart disease Son        Heart Disease before age 73   Diabetes Brother    Thyroid disease Neg Hx     Allergies  Allergen Reactions   Codeine Nausea Only and Other (See  Comments)    Cannot take on empty stomach.   Valium Nausea Only and Other (See Comments)    Cannot take on empty stomach.   Empagliflozin Other (See Comments)   Simvastatin Other (See Comments)   Sitagliptin Other (See Comments)    Current Outpatient Medications on File Prior to Visit  Medication Sig Dispense Refill   amLODipine (NORVASC) 10 MG tablet  TAKE ONE-HALF TABLET BY  MOUTH DAILY 45 tablet 3   aspirin 81 MG tablet Take 81 mg by mouth daily.     Insulin Glargine (TOUJEO SOLOSTAR [place]) Inject 5 Units into the skin. Prior to meals     LANTUS SOLOSTAR 100 UNIT/ML Solostar Pen INJECT SUBCUTANEOUSLY 40  UNITS DAILY (Patient taking differently: Inject 16 Units into the skin daily.) 45 mL 3   ONETOUCH ULTRA test strip CHECK BLOOD SUGAR 3 TIMES A DAY OR AS DIRECTED 100 strip 0   pantoprazole (PROTONIX) 40 MG tablet TAKE 1 TABLET BY MOUTH  DAILY 90 tablet 3   rosuvastatin (CRESTOR) 10 MG tablet TAKE 1 TABLET BY MOUTH 3  TIMES WEEKLY 39 tablet 3   ULTICARE MINI PEN NEEDLES 31G X 6 MM MISC USE AS DIRECTED 100 each 1   losartan (COZAAR) 50 MG tablet Take 1 tablet (50 mg total) by mouth 2 (two) times daily. (Patient not taking: Reported on 07/09/2021) 180 tablet 3   nitroGLYCERIN (NITROSTAT) 0.4 MG SL tablet Place 1 tablet (0.4 mg total) under the tongue every 5 (five) minutes as needed for chest pain. (Patient not taking: Reported on 11/13/2020) 25 tablet 1   No current facility-administered medications on file prior to visit.    BP 130/64    Pulse 68    Temp 98.6 F (37 C) (Oral)    Ht 5\' 9"  (1.753 m)    Wt 166 lb (75.3 kg)    SpO2 95%    BMI 24.51 kg/m       Objective:   Physical Exam Vitals and nursing note reviewed.  Constitutional:      Appearance: Normal appearance.  HENT:     Right Ear: Ear canal and external ear normal. Decreased hearing noted. No drainage. Tympanic membrane is scarred. Tympanic membrane is not erythematous.     Left Ear: Ear canal and external ear normal.  Decreased hearing noted. No drainage. Tympanic membrane is scarred. Tympanic membrane is not erythematous.     Ears:     Comments: Severely scarred TM bilaterally.  Neurological:     Mental Status: He is alert.      Assessment & Plan:  1. Bilateral hearing loss, unspecified hearing loss type - Unable to assess for eustachian tube dysfunction do to amount of scaring. Will refer to ENT and prescribe flonase. No signs of infection noted  - Ambulatory referral to ENT - fluticasone (FLONASE) 50 MCG/ACT nasal spray; Place 2 sprays into both nostrils daily.  Dispense: 16 g; Refill: 6  Dorothyann Peng, NP

## 2021-08-01 ENCOUNTER — Other Ambulatory Visit: Payer: Self-pay | Admitting: Cardiovascular Disease

## 2021-08-19 ENCOUNTER — Other Ambulatory Visit: Payer: Self-pay | Admitting: Family Medicine

## 2021-09-04 ENCOUNTER — Telehealth: Payer: Self-pay | Admitting: Pharmacist

## 2021-09-04 NOTE — Chronic Care Management (AMB) (Signed)
° ° °  Chronic Care Management Pharmacy Assistant   Name: SEMAJE KINKER  MRN: [id number] DOB: Dec 02, 1945  Reason for Encounter: Reconnect and reschedule appointment with Jeni Salles. Patient scheduled for an in office appointment to follow his wife's appointment as patient requested.     Care Gaps: AWV - scheduled 09/11/2021 Last BP - 130/64 on 07/09/2021 Last A1C - 8.6 on 12/25/2018 Zoster Vaccine - Never done Foot exam - Overdue Covid vaccine - overdue  Star Rating Drugs: Losartan  50mg  - Last filled 07/30/2021 90[facility] at [facility] Rosuvastatin 10mg  - Last filled 06/16/2021 90DS at [place] [phone]

## 2021-09-10 DIAGNOSIS — H6523 Chronic serous otitis media, bilateral: Secondary | ICD-10-CM | POA: Diagnosis not present

## 2021-09-10 DIAGNOSIS — H906 Mixed conductive and sensorineural hearing loss, bilateral: Secondary | ICD-10-CM | POA: Diagnosis not present

## 2021-09-15 DIAGNOSIS — H6523 Chronic serous otitis media, bilateral: Secondary | ICD-10-CM | POA: Diagnosis not present

## 2021-09-24 ENCOUNTER — Telehealth: Payer: Self-pay | Admitting: Family Medicine

## 2021-09-24 NOTE — Telephone Encounter (Signed)
Left message for patient to call back and schedule Medicare Annual Wellness Visit (AWV) either virtually or in office. Left  my [name] number [phone] ? ? ?Last AWV 10/13/20; please schedule at anytime with [facility] Nurse Health Advisor 1 or 2 ? ? ?This should be a 45 minute visit.  ? ?Awv can be scheduled calendar year [place] ?

## 2021-10-02 ENCOUNTER — Other Ambulatory Visit: Payer: Self-pay | Admitting: Family Medicine

## 2021-10-15 DIAGNOSIS — H6523 Chronic serous otitis media, bilateral: Secondary | ICD-10-CM | POA: Diagnosis not present

## 2021-10-15 DIAGNOSIS — Z9622 Myringotomy tube(s) status: Secondary | ICD-10-CM | POA: Diagnosis not present

## 2021-10-21 ENCOUNTER — Telehealth: Payer: Self-pay | Admitting: Pharmacist

## 2021-10-21 NOTE — Chronic Care Management (AMB) (Signed)
? ? ?Chronic Care Management ?Pharmacy Assistant  ? ?Name: [name]  MRN: [id number] DOB: [birth year] ? ?Reason for Encounter: Disease State / Hypertension Assessment Call ?  ?Conditions to be addressed/monitored: ?HTN ? ?Recent office visits:  ?07/09/2021 Dorothyann Peng NP - Patient was seen for Bilateral hearing loss, unspecified hearing loss type. Referral to ENT. Increased Flonase to 2 sprays in each nostril daily. Discontinued Atrovent. No follow up noted.  ? ?Recent consult visits:  ?09/10/2021 [name] (ENT) - Patient was seen for Mixed hearing loss, Bilateral chronic serous otitis media. No other chart notes.  ? ?Hospital visits:  ?None ? ?Medications: ?Outpatient Encounter Medications as of 10/21/2021  ?Medication Sig Note  ? amLODipine (NORVASC) 10 MG tablet TAKE ONE-HALF TABLET BY  MOUTH DAILY   ? aspirin 81 MG tablet Take 81 mg by mouth daily. 09/08/2017: Took 4 - 81 mg tablets today   ? fluticasone (FLONASE) 50 MCG/ACT nasal spray Place 2 sprays into both nostrils daily.   ? Insulin Glargine (TOUJEO SOLOSTAR [place]) Inject 5 Units into the skin. Prior to meals   ? LANTUS SOLOSTAR 100 UNIT/ML Solostar Pen INJECT SUBCUTANEOUSLY 40  UNITS DAILY (Patient taking differently: Inject 16 Units into the skin daily.)   ? losartan (COZAAR) 50 MG tablet Take 1 tablet (50 mg total) by mouth 2 (two) times daily. (Patient not taking: Reported on 07/09/2021)   ? nitroGLYCERIN (NITROSTAT) 0.4 MG SL tablet Place 1 tablet (0.4 mg total) under the tongue every 5 (five) minutes as needed for chest pain. (Patient not taking: Reported on 11/13/2020)   ? ONETOUCH ULTRA test strip CHECK BLOOD SUGAR 3 TIMES A DAY OR AS DIRECTED   ? pantoprazole (PROTONIX) 40 MG tablet TAKE 1 TABLET BY MOUTH DAILY   ? rosuvastatin (CRESTOR) 10 MG tablet TAKE 1 TABLET BY MOUTH 3  TIMES WEEKLY   ? ULTICARE MINI PEN NEEDLES 31G X 6 MM MISC USE AS DIRECTED   ? ?No facility-administered encounter medications on file as of 10/21/2021.  ?Fill  History: ?ROSUVASTATIN CALCIUM  10 MG TABS 09/09/2021 100  ? ?LOSARTAN POTASSIUM  50 MG TABS 07/30/2021 90  ? ?fluticasone (FLONASE) nasal spray 07/09/2021 30  ? ?Lantus Solostar U-100 Insulin 100 unit/mL (3 mL) subcutaneous pen 02/17/2021 30  ? ?AMLODIPINE BESYLATE  10 MG TABS 10/18/2021 100  ? ?Reviewed chart prior to disease state call. Spoke with patient regarding BP ? ?Recent Office Vitals: ?BP Readings from Last 3 Encounters:  ?07/09/21 130/64  ?02/04/21 (!) 153/70  ?10/31/20 132/76  ? ?Pulse Readings from Last 3 Encounters:  ?07/09/21 68  ?02/04/21 (!) 57  ?10/31/20 73  ?  ?Wt Readings from Last 3 Encounters:  ?07/09/21 166 lb (75.3 kg)  ?02/04/21 168 lb 12.8 oz (76.6 kg)  ?10/31/20 169 lb 9.6 oz (76.9 kg)  ?  ? ?Kidney Function ?Lab Results  ?Component Value Date/Time  ? CREATININE 1.23 10/16/2020 08:37 AM  ? CREATININE 1.19 (H) 04/11/2020 10:49 AM  ? CREATININE 1.28 (H) 07/16/2019 11:59 AM  ? CREATININE 1.03 04/03/2014 04:48 PM  ? GFR 64.41 07/03/2018 04:37 PM  ? GFRNONAA 60 04/11/2020 10:49 AM  ? GFRAA 69 04/11/2020 10:49 AM  ? ? ? ?  Latest Ref Rng & Units 10/16/2020  ?  8:37 AM 04/11/2020  ? 10:49 AM 07/16/2019  ? 11:59 AM  ?BMP  ?Glucose 65 - 99 mg/dL 129   184   195    ?BUN 8 - 27 mg/dL 14   16  12    ?Creatinine 0.76 - 1.27 mg/dL 1.23   1.19   1.28    ?BUN/Creat Ratio 10 - '24 11   13   9    '$ ?Sodium 134 - 144 mmol/L 140   139   140    ?Potassium 3.5 - 5.2 mmol/L 4.1   4.3   4.2    ?Chloride 96 - 106 mmol/L 102   102   101    ?CO2 20 - 29 mmol/L '24   25   24    '$ ?Calcium 8.6 - 10.2 mg/dL 9.4   9.6   9.7    ? ? ?Current antihypertensive regimen:  ?Amlodipine 10 mg take 1/2 tablet daily ?Losartan 50 mg take 1 tablet twice daily ? ?How often are you checking your Blood Pressure? Patient states he checks his blood pressures 1-2 times per week.  ? ?Current home BP readings: Patient states is last reading was around 120/70 and this is always where is stays.  ? ?What recent interventions/DTPs have been made by any  provider to improve Blood Pressure control since last CPP Visit: No recent interventions ? ?Any recent hospitalizations or ED visits since last visit with CPP? No recent hospital visits.  ? ?What diet changes have been made to improve Blood Pressure Control?  ?Patient follows a low carb diet ?Breakfast - patient will have unsweetened strudel  ?Lunch - patient will have a sandwich ?Dinner - patient will have a meal with a meat and vegetable ?Patient will eat some fruit ? ?What exercise is being done to improve your Blood Pressure Control?  ?Patient states he walks 2-3 miles daily, doing yard work and working around his house. ? ?Adherence Review: ?Is the patient currently on ACE/ARB medication? Yes ?Does the patient have >5 day gap between last estimated fill dates? No ? ?Care Gaps: ?AWV - scheduled 09/11/2021 ?Last BP - 130/64 on 07/09/2021 ?Last A1C - 8.6 on 12/25/2018 ?Zoster Vaccine - Never done ?Foot exam - Overdue ?Covid vaccine - overdue ?  ?Star Rating Drugs: ?Losartan  '50mg'$  - Last filled 09/09/2021 90DS at [facility] ?Rosuvastatin '10mg'$  - Last filled 07/30/2021 90DS at [facility] ? ?Gennie Alma CMA  ?Clinical Pharmacist Assistant ?[phone] ? ?

## 2021-10-22 ENCOUNTER — Telehealth: Payer: Self-pay | Admitting: Family Medicine

## 2021-10-22 NOTE — Telephone Encounter (Signed)
Left message for patient to call back and schedule Medicare Annual Wellness Visit (AWV) either virtually or in office. Left  my jabber number [phone]   Last AWV 10/13/20  please schedule at anytime with [facility] Nurse Health Advisor 1 or 2    

## 2021-10-28 DIAGNOSIS — Z794 Long term (current) use of insulin: Secondary | ICD-10-CM | POA: Diagnosis not present

## 2021-10-28 DIAGNOSIS — I10 Essential (primary) hypertension: Secondary | ICD-10-CM | POA: Diagnosis not present

## 2021-10-28 DIAGNOSIS — E1122 Type 2 diabetes mellitus with diabetic chronic kidney disease: Secondary | ICD-10-CM | POA: Diagnosis not present

## 2021-10-28 DIAGNOSIS — Z6824 Body mass index (BMI) 24.0-24.9, adult: Secondary | ICD-10-CM | POA: Diagnosis not present

## 2021-10-28 DIAGNOSIS — I25810 Atherosclerosis of coronary artery bypass graft(s) without angina pectoris: Secondary | ICD-10-CM | POA: Diagnosis not present

## 2021-10-28 DIAGNOSIS — E785 Hyperlipidemia, unspecified: Secondary | ICD-10-CM | POA: Diagnosis not present

## 2021-10-28 DIAGNOSIS — E1165 Type 2 diabetes mellitus with hyperglycemia: Secondary | ICD-10-CM | POA: Diagnosis not present

## 2021-11-13 ENCOUNTER — Other Ambulatory Visit: Payer: Self-pay | Admitting: Cardiovascular Disease

## 2021-11-13 DIAGNOSIS — E782 Mixed hyperlipidemia: Secondary | ICD-10-CM

## 2021-11-13 DIAGNOSIS — I25810 Atherosclerosis of coronary artery bypass graft(s) without angina pectoris: Secondary | ICD-10-CM

## 2021-11-16 ENCOUNTER — Telehealth: Payer: Self-pay

## 2021-11-16 ENCOUNTER — Other Ambulatory Visit: Payer: Self-pay

## 2021-11-16 DIAGNOSIS — I25810 Atherosclerosis of coronary artery bypass graft(s) without angina pectoris: Secondary | ICD-10-CM

## 2021-11-16 DIAGNOSIS — E782 Mixed hyperlipidemia: Secondary | ICD-10-CM

## 2021-11-16 MED ORDER — ROSUVASTATIN CALCIUM 10 MG PO TABS: ORAL_TABLET | ORAL | 3 refills | Status: DC

## 2021-11-16 NOTE — Telephone Encounter (Signed)
Spoke to patient's wife she stated husband is taking Crestor 10 mg daily 6 times a week.Advised he is due to having lipid panel and cmet.She requested lab orders to be mailed.Stated he will have done with PCP.She will have results sent to Dr.Croitoru. ?

## 2021-11-17 ENCOUNTER — Telehealth: Payer: Self-pay

## 2021-11-17 NOTE — Telephone Encounter (Signed)
Unsuccessful attempt to reach patient on preffered number listed in notes for scheduled AWV. Left message on voicemail okay to reschedule. ?

## 2021-11-26 ENCOUNTER — Telehealth: Payer: Self-pay | Admitting: Family Medicine

## 2021-11-26 NOTE — Telephone Encounter (Signed)
Left message for patient to call back and schedule Medicare Annual Wellness Visit (AWV) either virtually or in office. Left  my jabber number [phone]   Last AWV 10/13/20  please schedule at anytime with [facility] Nurse Health Advisor 1 or 2    

## 2021-12-22 ENCOUNTER — Telehealth: Payer: Self-pay | Admitting: Family Medicine

## 2021-12-22 NOTE — Telephone Encounter (Signed)
Left message for patient to call back and schedule Medicare Annual Wellness Visit (AWV) either virtually or in office. Left  my jabber number [phone]   Last AWV 10/13/20  please schedule at anytime with [facility] Nurse Health Advisor 1 or 2    

## 2021-12-25 ENCOUNTER — Encounter: Payer: Medicare Other | Admitting: Family Medicine

## 2021-12-28 ENCOUNTER — Ambulatory Visit (INDEPENDENT_AMBULATORY_CARE_PROVIDER_SITE_OTHER): Payer: Medicare Other | Admitting: Family Medicine

## 2021-12-28 ENCOUNTER — Encounter: Payer: Self-pay | Admitting: Family Medicine

## 2021-12-28 VITALS — BP 120/60 | HR 56 | Resp 16 | Ht 69.0 in | Wt 160.2 lb

## 2021-12-28 DIAGNOSIS — I25119 Atherosclerotic heart disease of native coronary artery with unspecified angina pectoris: Secondary | ICD-10-CM | POA: Diagnosis not present

## 2021-12-28 DIAGNOSIS — Z794 Long term (current) use of insulin: Secondary | ICD-10-CM

## 2021-12-28 DIAGNOSIS — G4700 Insomnia, unspecified: Secondary | ICD-10-CM

## 2021-12-28 DIAGNOSIS — I5032 Chronic diastolic (congestive) heart failure: Secondary | ICD-10-CM | POA: Diagnosis not present

## 2021-12-28 DIAGNOSIS — I723 Aneurysm of iliac artery: Secondary | ICD-10-CM | POA: Diagnosis not present

## 2021-12-28 DIAGNOSIS — E1140 Type 2 diabetes mellitus with diabetic neuropathy, unspecified: Secondary | ICD-10-CM

## 2021-12-28 DIAGNOSIS — Z0000 Encounter for general adult medical examination without abnormal findings: Secondary | ICD-10-CM | POA: Diagnosis not present

## 2021-12-28 DIAGNOSIS — F419 Anxiety disorder, unspecified: Secondary | ICD-10-CM | POA: Diagnosis not present

## 2021-12-28 DIAGNOSIS — I480 Paroxysmal atrial fibrillation: Secondary | ICD-10-CM

## 2021-12-28 MED ORDER — TRAZODONE HCL 50 MG PO TABS: 25.0000 mg | ORAL_TABLET | Freq: Every day | ORAL | 0 refills | Status: DC

## 2021-12-28 NOTE — Progress Notes (Addendum)
HPI: [name] is a 76 y.o.male here today for his routine physical examination.  Last CPE: 2021  He does not have an exercise routine but he is active with yard work. In general he follows a healthful diet. Home made meals most of the time, little fish, occasionally a hamburger. Mostly chicken  Chronic medical problems: DM 2, hypertension, depression, hyperlipidemia, GERD, CAD, and atrial fibrillation among some.  Evaluated by ENT in 09/2020 for hearing loss, greatly improved after bilateral myringotomy with tubes placement.  Immunization History  Administered Date(s) Administered   Fluad Quad(high Dose 65+) 04/11/2019, 04/11/2020, 04/28/2021   Influenza, High Dose Seasonal PF 04/30/2014, 04/04/2015, 03/19/2016, 04/15/2017, 04/11/2018   Moderna Sars-Covid-2 Vaccination 08/23/2019, 09/21/2019, 05/20/2020, 01/20/2021   Pneumococcal Conjugate-13 02/24/2015   Pneumococcal Polysaccharide-23 04/11/2020   Tdap 07/12/2012   Zoster, Live 08/07/2013   Health Maintenance  Topic Date Due   OPHTHALMOLOGY EXAM  11/10/2021   COVID-19 Vaccine (5 - Moderna series) 01/13/2022 (Originally 03/17/2021)   Zoster Vaccines- Shingrix (1 of 2) 07/16/2022 (Originally 01/20/1996)   INFLUENZA VACCINE  02/09/2022   HEMOGLOBIN A1C  04/29/2022   TETANUS/TDAP  07/12/2022   FOOT EXAM  10/29/2022   COLONOSCOPY (Pts 45-65yr Insurance coverage will need to be confirmed)  09/15/2025   Pneumonia Vaccine 76 Years old  Completed   Hepatitis C Screening  Completed   HPV VACCINES  Aged Out   Former smoker, cigars 1-2 times per year until 2019. States that he never smoke cigarettes.  -Concerns and/or follow up today:  HLD on rosuvastatin 10 mg daily.  Lab Results  Component Value Date   CHOL 161 10/16/2020   HDL 29 (L) 10/16/2020   LDLCALC 92 10/16/2020   LDLDIRECT 74.0 12/28/2016   TRIG 234 (H) 10/16/2020   CHOLHDL 5.6 (H) 10/16/2020   Hypertension on losartan 50 mg daily and amlodipine 10 mg  daily. CAD, atrial fibrillation, and chronic diastolic dysfunction. He has appointment with his cardiologist on 01/07/22.. 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 He is no longer following vascular.  Lab Results  Component Value Date   CREATININE 1.23 10/16/2020   BUN 14 10/16/2020   NA 140 10/16/2020   K 4.1 10/16/2020   CL 102 10/16/2020   CO2 24 10/16/2020   HLD on Rosuvastatin 10 mg daily. He has lab appt a week before seeing Dr. CSallyanne Kuster his cardiologist, for FLP and CMP.  Lab Results  Component Value Date   CHOL 161 10/16/2020   HDL 29 (L) 10/16/2020   LDLCALC 92 10/16/2020   LDLDIRECT 74.0 12/28/2016   TRIG 234 (H) 10/16/2020   CHOLHDL 5.6 (H) 10/16/2020   DM II: Follows with endocrinologist, Dr BChalmers Cater Last HgA1C 7.3 in 10/2021.  Today he is c/o "shaking inside", usually it happens when he is anxious. In the past few months his sister and nephew died from MI. Sleeping about 3 hours, he feels like he needs "something" to help him "relax." Denies depressed mood. In the past he took lexapro,he did not feel like it helped and he thinks he had side effects (nausea). He has tried CBT and did not feel like it helped.     12/28/2021    3:02 PM 02/04/2021    9:33 AM 10/13/2020    1:43 PM 10/13/2020    1:42 PM 10/13/2020    1:38 PM  Depression screen PHQ 2/9  Decreased Interest 0 0 0 0 0  Down, Depressed, Hopeless 0  0 0 0 0  PHQ - 2 Score 0 0 0 0 0   Review of Systems  Constitutional:  Positive for fatigue. Negative for activity change, appetite change and fever.  HENT:  Negative for mouth sores, nosebleeds and sore throat.   Eyes:  Negative for redness and visual disturbance.  Respiratory:  Negative for cough, shortness of breath and wheezing.   Cardiovascular:  Negative for chest pain and leg swelling.  Gastrointestinal:  Negative for abdominal pain, nausea and vomiting.  Endocrine: Negative for cold intolerance, heat intolerance,  polydipsia, polyphagia and polyuria.  Genitourinary:  Negative for decreased urine volume, dysuria and hematuria.  Musculoskeletal:  Positive for arthralgias. Negative for gait problem.  Skin:  Negative for rash.  Neurological:  Negative for syncope, weakness and headaches.  Psychiatric/Behavioral:  Positive for sleep disturbance. The patient is nervous/anxious.   All other systems reviewed and are negative.  Current Outpatient Medications on File Prior to Visit  Medication Sig Dispense Refill   amLODipine (NORVASC) 10 MG tablet TAKE ONE-HALF TABLET BY  MOUTH DAILY 45 tablet 3   aspirin 81 MG tablet Take 81 mg by mouth daily.     Insulin Glargine (TOUJEO SOLOSTAR ) Inject 5 Units into the skin. Prior to meals     LANTUS SOLOSTAR 100 UNIT/ML Solostar Pen INJECT SUBCUTANEOUSLY 40  UNITS DAILY (Patient taking differently: Inject 16 Units into the skin daily.) 45 mL 3   losartan (COZAAR) 50 MG tablet Take 1 tablet (50 mg total) by mouth 2 (two) times daily. 180 tablet 3   ONETOUCH ULTRA test strip CHECK BLOOD SUGAR 3 TIMES A DAY OR AS DIRECTED 100 strip 0   pantoprazole (PROTONIX) 40 MG tablet TAKE 1 TABLET BY MOUTH DAILY 30 tablet 11   rosuvastatin (CRESTOR) 10 MG tablet Take 10 daily 6 times a week 90 tablet 3   rosuvastatin (CRESTOR) 10 MG tablet Take 10 mg daily 6 times a week 90 tablet 3   ULTICARE MINI PEN NEEDLES 31G X 6 MM MISC USE AS DIRECTED 100 each 1   No current facility-administered medications on file prior to visit.   Past Medical History:  Diagnosis Date   Anxiety    Arthritis    "back" (04/05/2014)   Atrial fibrillation (HCC)    Blood in stool    Bradycardia- with decrease of BB some improvement 09/10/2017   CAD (coronary artery disease)    CAD s/p CABG 2006 08/16/2013   a. Dr Darcey Nora - to LAD, SVG to diagonal, SVG to OM, SVG to PDA ;  b.  [facility] (9/15): ostial LAD occl, ostial CFX 60%, prox AVCFX 95%, RCA 95%, S-PDA occl, S-OM/Dx with Dx limb occl and prox 60%, L-LAD  patent, EF 35-40% wtih ant-lat and apical HK-AK >> PCI:  BMS to native RCA    Diverticulosis    Dizziness    DM type 2 causing complication ([place]) 11/11/8411   GERD (gastroesophageal reflux disease)    Hypertension    Iliac artery aneurysm ([place])    Ischemic cardiomyopathy    a. EF 35-40% by LV gram 03/2014   Joint pain    Kidney stones    Kidney stones    Lower back pain Jan 2015   Mixed hyperlipidemia 08/16/2013   Myocardial infarction ([place]) 03/2014 X 2?   S/P angioplasty with stent DES mid SVG to OM and DES to prox. SVG to OM, DAPT for lifetime 09/10/2017   Type II diabetes mellitus ([place])    Past  Surgical History:  Procedure Laterality Date   CARDIAC CATHETERIZATION  2006   "before [facility]"   CORONARY ANGIOPLASTY WITH STENT PLACEMENT  04/05/2014   "1"   CORONARY ARTERY BYPASS GRAFT  2006   LIMA to LAD,SVG to diagonal,SVG to obtuse marginal,SVG to posterior descending   CORONARY STENT INTERVENTION N/A 09/09/2017   Procedure: CORONARY STENT INTERVENTION;  Surgeon: Burnell Blanks, MD;  Location: [facility];  Service: Cardiovascular;  Laterality: N/A;   CORONARY STENT PLACEMENT  09/09/2017   CYSTOSCOPY W/ STONE MANIPULATION  1970's X 2   CYSTOSCOPY W/ URETEROSCOPY W/ LITHOTRIPSY  ~ 2009   ILIAC ARTERY STENT Left Aug. 10, 2011   Aorta to right ext iliac and left CIA stent repair    LEFT HEART CATH AND CORS/GRAFTS ANGIOGRAPHY N/A 09/08/2017   Procedure: LEFT HEART CATH AND CORS/GRAFTS ANGIOGRAPHY;  Surgeon: [name], MD;  Location: [facility];  Service: Cardiovascular;  Laterality: N/A;   LEFT HEART CATHETERIZATION WITH CORONARY/GRAFT ANGIOGRAM N/A 04/05/2014   Procedure: LEFT HEART CATHETERIZATION WITH [name];  Surgeon: [name], MD;  Location[facility];  Service: Cardiovascular;  Laterality: N/A;   NM MYOCAR PERF WALL MOTION  11/18/2009   low risk   TONSILLECTOMY AND ADENOIDECTOMY  1951   Allergies  Allergen Reactions   Codeine Nausea  Only and Other (See Comments)    Cannot take on empty stomach.   Valium Nausea Only and Other (See Comments)    Cannot take on empty stomach.   Empagliflozin Other (See Comments)   Simvastatin Other (See Comments)   Sitagliptin Other (See Comments)   Family History  Problem Relation Age of Onset   Diabetes Mother    Hypertension Mother    Heart disease Mother        AAA   Heart disease Sister    Heart disease Son        Heart Disease before age 71   Diabetes Brother    Thyroid disease Neg Hx     Social History   Socioeconomic History   Marital status: Married    Spouse name: Not on file   Number of children: 3   Years of education: Not on file   Highest education level: Not on file  Occupational History   Not on file  Tobacco Use   Smoking status: Former    Types: Cigars    Quit date: 09/09/2017    Years since quitting: 4.3   Smokeless tobacco: Never   Tobacco comments:    states he smoked cigar   Vaping Use   Vaping Use: Never used  Substance and Sexual Activity   Alcohol use: No   Drug use: No   Sexual activity: Yes  Other Topics Concern   Not on file  Social History Narrative   Lives with wife   Caffeine use: Soda sometimes   1/2 cup coffee in the morning sometimes   Right handed    Social Determinants of Health   Financial Resource Strain: Medium Risk (11/28/2020)   Overall Financial Resource Strain (CARDIA)    Difficulty of Paying Living Expenses: Somewhat hard  Food Insecurity: No Food Insecurity (10/13/2020)   Hunger Vital Sign    Worried About Running Out of Food in the Last Year: Never true    Ran Out of Food in the Last Year: Never true  Transportation Needs: No Transportation Needs (11/28/2020)   PRAPARE - Hydrologist (  Medical): No    Lack of Transportation (Non-Medical): No  Physical Activity: Insufficiently Active (10/13/2020)   Exercise Vital Sign    Days of Exercise per Week: 3 days    Minutes of Exercise per  Session: 30 min  Stress: No Stress Concern Present (10/13/2020)   [place]    Feeling of Stress : Not at all  Social Connections: [place] (10/13/2020)   Social Connection and Isolation Panel [NHANES]    Frequency of Communication with Friends and Family: More than three times a week    Frequency of Social Gatherings with Friends and Family: More than three times a week    Attends Religious Services: More than 4 times per year    Active Member of [employer] or Organizations: Not on file    Attends Archivist Meetings: 1 to 4 times per year    Marital Status: Married   Vitals:   12/28/21 1451  BP: 120/60  Pulse: (!) 56  Resp: 16  SpO2: 98%   Body mass index is 23.66 kg/m. Wt Readings from Last 3 Encounters:  12/28/21 160 lb 4 oz (72.7 kg)  07/09/21 166 lb (75.3 kg)  02/04/21 168 lb 12.8 oz (76.6 kg)   Physical Exam Vitals and nursing note reviewed.  Constitutional:      General: He is not in acute distress.    Appearance: He is well-developed.  HENT:     Head: Normocephalic and atraumatic.     Right Ear: Tympanic membrane, ear canal and external ear normal.     Left Ear: Tympanic membrane, ear canal and external ear normal.     Ears:     Comments: T-tube in place, bilateral.    Mouth/Throat:     Mouth: Mucous membranes are moist.     Pharynx: Oropharynx is clear.  Eyes:     Conjunctiva/sclera: Conjunctivae normal.     Pupils: Pupils are equal, round, and reactive to light.  Neck:     Thyroid: No thyroid mass.  Cardiovascular:     Rate and Rhythm: Bradycardia present. Rhythm irregular.     Pulses:          Dorsalis pedis pulses are 2+ on the right side and 2+ on the left side.     Heart sounds: No murmur heard. Pulmonary:     Effort: Pulmonary effort is normal. No respiratory distress.     Breath sounds: Normal breath sounds.  Abdominal:     Palpations: Abdomen is soft. There is no  hepatomegaly or mass.     Tenderness: There is no abdominal tenderness.  Genitourinary:    Comments: No concerns. Musculoskeletal:        General: No tenderness.     Cervical back: Normal range of motion.     Comments: No signs of synovitis.  Lymphadenopathy:     Cervical: No cervical adenopathy.  Skin:    General: Skin is warm.     Findings: No erythema.  Neurological:     General: No focal deficit present.     Mental Status: He is alert and oriented to person, place, and time.     Cranial Nerves: No cranial nerve deficit.     Sensory: No sensory deficit.     Motor: No tremor or pronator drift.     Comments: DTR's 1+ to 2+ symmetric (biceps and patellar). Stable gait without assistance.  Psychiatric:        Mood and Affect:  Mood is anxious.   ASSESSMENT AND PLAN:  Mr. Ayce was seen today for annual exam.  Diagnoses and all orders for this visit:  Routine general medical examination at a health care facility We discussed the importance of regular physical activity and healthy diet for prevention of chronic illness and/or complications. Preventive guidelines reviewed. Vaccination up-to-date. Colonoscopy on 09/16/15 at [facility]' Physician [facility]. Next CPE in a year.  He is having labs in a few days (CMP and FLP).  Insomnia, unspecified type We discussed possible etiologies. Pharmacologic options reviewed, he agrees with trazodone 50 mg 1/2 tablet daily at bedtime. We discussed some side effects. Good sleep hygiene also recommended. Follow-up in 3 months, before if needed.  -     traZODone (DESYREL) 50 MG tablet; Take 0.5 tablets (25 mg total) by mouth at bedtime.  Anxiety disorder, unspecified type Trazodone 50 mg 1/2 tablet daily started today. He is not interested in trying CBT. Follow-up in 3 months, before if needed.  -     traZODone (DESYREL) 50 MG tablet; Take 0.5 tablets (25 mg total) by mouth at bedtime.  Coronary artery disease involving native coronary artery  of native heart with angina pectoris (HCC) On Aspirin 81 mg daily. Had some side effects with Plavix. Following with cardiologist.  Chronic diastolic heart failure ([place]) Follows with cardiologist.  Type 2 diabetes mellitus with diabetic neuropathy, with long-term current use of insulin ([place]) HgA1C appropriate. Following with endocrinologist. Has appt for eye exam in 01/2022.  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 Last follow up with vascular 02/04/21, no further follow up was recommended.  Paroxysmal atrial fibrillation (HCC) Not rhythm control, mild bradycardia. He is not on anticoagulation. Continue Aspirin 81 mg daily. Following with cardiologist.  Return in 3 months (on 03/30/2022) for anxiety and insomnia..  Kirra Verga G. [name], MD  [facility]. [place] office.

## 2021-12-28 NOTE — Patient Instructions (Addendum)
A few things to remember from today's visit:  Routine general medical examination at a health care facility  Insomnia, unspecified type - Plan: traZODone (DESYREL) 50 MG tablet  Anxiety disorder, unspecified type - Plan: traZODone (DESYREL) 50 MG tablet  If you need refills please call your pharmacy. Do not use My Chart to request refills or for acute issues that need immediate attention.   Please be sure medication list is accurate. If a new problem present, please set up appointment sooner than planned today.  Preventive Care 64 Years and Older, Male Preventive care refers to lifestyle choices and visits with your health care provider that can promote health and wellness. Preventive care visits are also called wellness exams. What can I expect for my preventive care visit? Counseling During your preventive care visit, your health care provider may ask about your: Medical history, including: Past medical problems. Family medical history. History of falls. Current health, including: Emotional well-being. Home life and relationship well-being. Sexual activity. Memory and ability to understand (cognition). Lifestyle, including: Alcohol, nicotine or tobacco, and drug use. Access to firearms. Diet, exercise, and sleep habits. Work and work Statistician. Sunscreen use. Safety issues such as seatbelt and bike helmet use. Physical exam Your health care provider will check your: Height and weight. These may be used to calculate your BMI (body mass index). BMI is a measurement that tells if you are at a healthy weight. Waist circumference. This measures the distance around your waistline. This measurement also tells if you are at a healthy weight and may help predict your risk of certain diseases, such as type 2 diabetes and high blood pressure. Heart rate and blood pressure. Body temperature. Skin for abnormal spots. What immunizations do I need?  Vaccines are usually given at  various ages, according to a schedule. Your health care provider will recommend vaccines for you based on your age, medical history, and lifestyle or other factors, such as travel or where you work. What tests do I need? Screening Your health care provider may recommend screening tests for certain conditions. This may include: Lipid and cholesterol levels. Diabetes screening. This is done by checking your blood sugar (glucose) after you have not eaten for a while (fasting). Hepatitis C test. Hepatitis B test. HIV (human immunodeficiency virus) test. STI (sexually transmitted infection) testing, if you are at risk. Lung cancer screening. Colorectal cancer screening. Prostate cancer screening. Abdominal aortic aneurysm (AAA) screening. You may need this if you are a current or former smoker. Talk with your health care provider about your test results, treatment options, and if necessary, the need for more tests. Follow these instructions at home: Eating and drinking  Eat a diet that includes fresh fruits and vegetables, whole grains, lean protein, and low-fat dairy products. Limit your intake of foods with high amounts of sugar, saturated fats, and salt. Take vitamin and mineral supplements as recommended by your health care provider. Do not drink alcohol if your health care provider tells you not to drink. If you drink alcohol: Limit how much you have to 0-2 drinks a day. Know how much alcohol is in your drink. In the [place] one drink equals one 12 oz bottle of beer (355 mL), one 5 oz glass of wine (148 mL), or one 1 oz glass of hard liquor (44 mL). Lifestyle Brush your teeth every morning and night with fluoride toothpaste. Floss one time each day. Exercise for at least 30 minutes 5 or more days each week. Do not use  any products that contain nicotine or tobacco. These products include cigarettes, chewing tobacco, and vaping devices, such as e-cigarettes. If you need help quitting, ask  your health care provider. Do not use drugs. If you are sexually active, practice safe sex. Use a condom or other form of protection to prevent STIs. Take aspirin only as told by your health care provider. Make sure that you understand how much to take and what form to take. Work with your health care provider to find out whether it is safe and beneficial for you to take aspirin daily. Ask your health care provider if you need to take a cholesterol-lowering medicine (statin). Find healthy ways to manage stress, such as: Meditation, yoga, or listening to music. Journaling. Talking to a trusted person. Spending time with friends and family. Safety Always wear your seat belt while driving or riding in a vehicle. Do not drive: If you have been drinking alcohol. Do not ride with someone who has been drinking. When you are tired or distracted. While texting. If you have been using any mind-altering substances or drugs. Wear a helmet and other protective equipment during sports activities. If you have firearms in your house, make sure you follow all gun safety procedures. Minimize exposure to UV radiation to reduce your risk of skin cancer. What's next? Visit your health care provider once a year for an annual wellness visit. Ask your health care provider how often you should have your eyes and teeth checked. Stay up to date on all vaccines. This information is not intended to replace advice given to you by your health care provider. Make sure you discuss any questions you have with your health care provider. Document Revised: 12/24/2020 Document Reviewed: 12/24/2020 Elsevier Patient Education  [place].

## 2021-12-31 DIAGNOSIS — I25810 Atherosclerosis of coronary artery bypass graft(s) without angina pectoris: Secondary | ICD-10-CM | POA: Diagnosis not present

## 2021-12-31 DIAGNOSIS — E782 Mixed hyperlipidemia: Secondary | ICD-10-CM | POA: Diagnosis not present

## 2021-12-31 LAB — COMPREHENSIVE METABOLIC PANEL
ALT: 12 IU/L (ref 0–44)
AST: 33 IU/L (ref 0–40)
Albumin/Globulin Ratio: 2.1 (ref 1.2–2.2)
Albumin: 4.6 g/dL (ref 3.7–4.7)
Alkaline Phosphatase: 87 IU/L (ref 44–121)
BUN/Creatinine Ratio: 12 (ref 10–24)
BUN: 14 mg/dL (ref 8–27)
Bilirubin Total: 0.7 mg/dL (ref 0.0–1.2)
CO2: 26 mmol/L (ref 20–29)
Calcium: 9.3 mg/dL (ref 8.6–10.2)
Chloride: 102 mmol/L (ref 96–106)
Creatinine, Ser: 1.14 mg/dL (ref 0.76–1.27)
Globulin, Total: 2.2 g/dL (ref 1.5–4.5)
Glucose: 145 mg/dL — ABNORMAL HIGH (ref 70–99)
Potassium: 4 mmol/L (ref 3.5–5.2)
Sodium: 139 mmol/L (ref 134–144)
Total Protein: 6.8 g/dL (ref 6.0–8.5)
eGFR: 67 mL/min/{1.73_m2} (ref >59)

## 2021-12-31 LAB — LIPID PANEL
Chol/HDL Ratio: 4 ratio (ref 0.0–5.0)
Cholesterol, Total: 123 mg/dL (ref 100–199)
HDL: 31 mg/dL — ABNORMAL LOW (ref >39)
LDL Chol Calc (NIH): 59 mg/dL (ref 0–99)
Triglycerides: 201 mg/dL — ABNORMAL HIGH (ref 0–149)
VLDL Cholesterol Cal: 33 mg/dL (ref 5–40)

## 2022-01-04 ENCOUNTER — Encounter: Payer: Self-pay | Admitting: *Deleted

## 2022-01-07 ENCOUNTER — Ambulatory Visit: Payer: Medicare Other | Admitting: Cardiovascular Disease

## 2022-01-07 ENCOUNTER — Encounter: Payer: Self-pay | Admitting: Cardiovascular Disease

## 2022-01-07 VITALS — BP 140/72 | HR 56 | Ht 69.0 in | Wt 160.0 lb

## 2022-01-07 DIAGNOSIS — I10 Essential (primary) hypertension: Secondary | ICD-10-CM | POA: Diagnosis not present

## 2022-01-07 DIAGNOSIS — I739 Peripheral vascular disease, unspecified: Secondary | ICD-10-CM | POA: Diagnosis not present

## 2022-01-07 DIAGNOSIS — F32A Depression, unspecified: Secondary | ICD-10-CM | POA: Diagnosis not present

## 2022-01-07 DIAGNOSIS — I5032 Chronic diastolic (congestive) heart failure: Secondary | ICD-10-CM

## 2022-01-07 DIAGNOSIS — E785 Hyperlipidemia, unspecified: Secondary | ICD-10-CM | POA: Diagnosis not present

## 2022-01-07 DIAGNOSIS — Z794 Long term (current) use of insulin: Secondary | ICD-10-CM

## 2022-01-07 DIAGNOSIS — E1140 Type 2 diabetes mellitus with diabetic neuropathy, unspecified: Secondary | ICD-10-CM

## 2022-01-07 DIAGNOSIS — I25810 Atherosclerosis of coronary artery bypass graft(s) without angina pectoris: Secondary | ICD-10-CM | POA: Diagnosis not present

## 2022-01-07 DIAGNOSIS — R4189 Other symptoms and signs involving cognitive functions and awareness: Secondary | ICD-10-CM

## 2022-01-07 NOTE — Patient Instructions (Signed)

## 2022-01-07 NOTE — Progress Notes (Signed)
Patient ID: [name], male   DOB: 08-07-1945, 76 y.o.   MRN: [id number]    Cardiology Office Note    Date:  01/12/2022   ID:  [name], [name] [birth year], MRN [id number]  PCP:  [name], [name], MD  Cardiologist:   Scott Klein, MD   Chief Complaint  Patient presents with   Coronary Artery Disease         History of Present Illness:  [name] is a 76 y.o. male with a long-standing history of coronary artery disease who returns for follow-up.  As always, he is accompanied by his wife [name], who is also my patient.   Continues to have issues of anxiety and stress.  A number of family members have passed away.  He is lost another 6 pounds in weight and now has a BMI of just over 23.  He describes multiple symptoms of depression, in particular anhedonia.  He does not enjoy meeting people anymore, has not been performing his usual hobbies, "does not feel like doing anything".  Appetite is poor, as is his sleep.  He was just prescribed trazodone 25 mg at bedtime to help with sleep but has not started taking it yet.  Insulin requirements have decreased with weight loss.  The patient specifically denies any chest pain at rest exertion, dyspnea at rest or with exertion, orthopnea, paroxysmal nocturnal dyspnea, syncope, palpitations, focal neurological deficits, intermittent claudication, lower extremity edema, unexplained weight gain, cough, hemoptysis or wheezing.  All labs checked just a few days ago, LDL was 59, just a few days ago, but his HDL remains low at 31.  Triglycerides are better, slightly high at 201.  Hemoglobin A1c was 7.3% in April.  He had side effects to multiple newer diabetes medications and is now on Lantus, seeing Dr. Jerral Steele.    He last had ultrasound for his abdominal aortic aneurysm/iliac artery aneurysm in July 2022. EVAR stent remains patent, without endoleak, maximum diameter 2.1 cm.  Lower extremities had normal ABIs bilaterally in 2019.  He saw Dr. Jannifer Steele  to evaluate numerous atypical neurological complaints including memory deficit.  His MRI showed a moderate level of small vessel disease and atrophy which could explain his memory difficulties.  There was much more pronounced atrophy in the frontal and temporal insular region especially on the left side, raising the possibility that he could have frontotemporal dementia or Lewy body disease.    March 08, 2019 FNA of thyroid nodule showed benign findings.  He underwent four-vessel bypass surgery in 2006 (LIMA-LAD, SVG x 3 to Diagonal, OM and PDA). In 2015 received a bare metal stent to the proximal RCA (4.5 mm vessel). On September 08, 2017 he underwent cardiac catheterization which showed a long segment of severe stenosis in the SVG to OM (chronically occluded circumflex), patent LIMA to occluded LAD proximal RCA stent with 80% stenosis in the distal branch of the RCA.  September 09, 2017 he underwent revascularization of the SVG-OM with 2 drug-eluting stents.  He did not tolerate Brilinta and asked to be switched to clopidogrel. He has had EVAR for AAA amd received a stent in the large right common iliac artery aneurysm and is monitored at [facility].  He has insulin requiring diabetes mellitus. He has chronically low HDL cholesterol.  Past Medical History:  Diagnosis Date   Anxiety    Arthritis    "back" (04/05/2014)   Atrial fibrillation (HCC)    Blood in stool    Bradycardia- with  decrease of BB some improvement 09/10/2017   CAD (coronary artery disease)    CAD s/p CABG 2006 08/16/2013   a. Dr Darcey Nora - to LAD, SVG to diagonal, SVG to OM, SVG to PDA ;  b.  [facility]HC (9/15): ostial LAD occl, ostial CFX 60%, prox AVCFX 95%, RCA 95%, S-PDA occl, S-OM/Dx with Dx limb occl and prox 60%, L-LAD patent, EF 35-40% wtih ant-lat and apical HK-AK >> PCI:  BMS to native RCA    Diverticulosis    Dizziness    DM type 2 causing complication ([place]) 4/0/9811   GERD (gastroesophageal reflux disease)    Hypertension    Iliac artery  aneurysm ([place])    Ischemic cardiomyopathy    a. EF 35-40% by LV gram 03/2014   Joint pain    Kidney stones    Kidney stones    Lower back pain Jan 2015   Mixed hyperlipidemia 08/16/2013   Myocardial infarction ([place]) 03/2014 X 2?   S/P angioplasty with stent DES mid SVG to OM and DES to prox. SVG to OM, DAPT for lifetime 09/10/2017   Type II diabetes mellitus [facility])     Past Surgical History:  Procedure Laterality Date   CARDIAC CATHETERIZATION  2006   "before [facility]"   CORONARY ANGIOPLASTY WITH STENT PLACEMENT  04/05/2014   "1"   CORONARY ARTERY BYPASS GRAFT  2006   LIMA to LAD,SVG to diagonal,SVG to obtuse marginal,SVG to posterior descending   CORONARY STENT INTERVENTION N/A 09/09/2017   Procedure: CORONARY STENT INTERVENTION;  Surgeon: Burnell Blanks, MD;  Location: [place] [facility];  Service: Cardiovascular;  Laterality: N/A;   CORONARY STENT PLACEMENT  09/09/2017   CYSTOSCOPY W/ STONE MANIPULATION  1970's X 2   CYSTOSCOPY W/ URETEROSCOPY W/ LITHOTRIPSY  ~ 2009   ILIAC ARTERY STENT Left Aug. 10, 2011   Aorta to right ext iliac and left CIA stent repair    LEFT HEART CATH AND CORS/GRAFTS ANGIOGRAPHY N/A 09/08/2017   Procedure: LEFT HEART CATH AND CORS/GRAFTS ANGIOGRAPHY;  Surgeon: [name], MD;  Location: [place] [facility];  Service: Cardiovascular;  Laterality: N/A;   LEFT HEART CATHETERIZATION WITH CORONARY/GRAFT ANGIOGRAM N/A 04/05/2014   Procedure: LEFT HEART CATHETERIZATION WITH [name];  Surgeon: [name], MD;  Location: [facility] C[facility] LAB;  Service: Cardiovascular;  Laterality: N/A;   NM MYOCAR PERF WALL MOTION  11/18/2009   low risk   TONSILLECTOMY AND ADENOIDECTOMY  1951    Outpatient Medications Prior to Visit  Medication Sig Dispense Refill   amLODipine (NORVASC) 10 MG tablet TAKE ONE-HALF TABLET BY  MOUTH DAILY 45 tablet 3   aspirin 81 MG tablet Take 81 mg by mouth daily.     insulin glargine (LANTUS) 100 UNIT/ML injection Inject 20 Units  into the skin daily.     LANTUS SOLOSTAR 100 UNIT/ML Solostar Pen INJECT SUBCUTANEOUSLY 40  UNITS DAILY (Patient taking differently: Inject 16 Units into the skin daily.) 45 mL 3   losartan (COZAAR) 50 MG tablet Take 1 tablet (50 mg total) by mouth 2 (two) times daily. 180 tablet 3   ONETOUCH ULTRA test strip CHECK BLOOD SUGAR 3 TIMES A DAY OR AS DIRECTED 100 strip 0   pantoprazole (PROTONIX) 40 MG tablet TAKE 1 TABLET BY MOUTH DAILY 30 tablet 11   rosuvastatin (CRESTOR) 10 MG tablet Take 10 daily 6 times a week 90 tablet 3   ULTICARE MINI PEN NEEDLES 31G X 6 MM MISC USE AS DIRECTED 100  each 1   Insulin Glargine (TOUJEO SOLOSTAR [place]) Inject 5 Units into the skin. Prior to meals (Patient not taking: Reported on 01/07/2022)     rosuvastatin (CRESTOR) 10 MG tablet Take 10 mg daily 6 times a week 90 tablet 3   traZODone (DESYREL) 50 MG tablet Take 0.5 tablets (25 mg total) by mouth at bedtime. (Patient not taking: Reported on 01/07/2022) 30 tablet 0   No facility-administered medications prior to visit.     Allergies:   Codeine, Valium, Empagliflozin, Simvastatin, and Sitagliptin   Social History   Socioeconomic History   Marital status: Married    Spouse name: Not on file   Number of children: 3   Years of education: Not on file   Highest education level: Not on file  Occupational History   Not on file  Tobacco Use   Smoking status: Former    Types: Cigars    Quit date: 09/09/2017    Years since quitting: 4.3   Smokeless tobacco: Never   Tobacco comments:    states he smoked cigar 1-2 per year.    01/07/2022 patient stated he smoked 1 cigarette before coming to appointment  Vaping Use   Vaping Use: Never used  Substance and Sexual Activity   Alcohol use: No   Drug use: No   Sexual activity: Yes  Other Topics Concern   Not on file  Social History Narrative   Lives with wife   Caffeine use: Soda sometimes   1/2 cup coffee in the morning sometimes   Right handed    Social  Determinants of Health   Financial Resource Strain: Medium Risk (11/28/2020)   Overall Financial Resource Strain (CARDIA)    Difficulty of Paying Living Expenses: Somewhat hard  Food Insecurity: No Food Insecurity (10/13/2020)   Hunger Vital Sign    Worried About Running Out of Food in the Last Year: Never true    Ran Out of Food in the Last Year: Never true  Transportation Needs: No Transportation Needs (11/28/2020)   PRAPARE - Hydrologist (Medical): No    Lack of Transportation (Non-Medical): No  Physical Activity: Insufficiently Active (10/13/2020)   Exercise Vital Sign    Days of Exercise per Week: 3 days    Minutes of Exercise per Session: 30 min  Stress: No Stress Concern Present (10/13/2020)   [place]    Feeling of Stress : Not at all  Social Connections: Socially Integrated (10/13/2020)   Social Connection and Isolation Panel [NHANES]    Frequency of Communication with Friends and Family: More than three times a week    Frequency of Social Gatherings with Friends and Family: More than three times a week    Attends Religious Services: More than 4 times per year    Active Member of [employer] or Organizations: Not on file    Attends Archivist Meetings: 1 to 4 times per year    Marital Status: Married     Family History:  The patient's family history includes Diabetes in his brother and mother; Heart disease in his mother, sister, and son; Hypertension in his mother.   ROS:   Please see the history of present illness.    ROS All other systems are reviewed and are negative.   PHYSICAL EXAM:   VS:  BP 140/72 (BP Location: Left Arm, Patient Position: Sitting, Cuff Size: Normal)   Pulse (!) 56   Ht  $'5\' 9"'d$  (1.753 m)   Wt 160 lb (72.6 kg)   SpO2 95%   BMI 23.63 kg/m      General: Alert, oriented x3, no distress, leaner than I have ever seen him Head: no evidence of trauma,  PERRL, EOMI, no exophtalmos or lid lag, no myxedema, no xanthelasma; normal ears, nose and oropharynx Neck: normal jugular venous pulsations and no hepatojugular reflux; brisk carotid pulses without delay and no carotid bruits Chest: clear to auscultation, no signs of consolidation by percussion or palpation, normal fremitus, symmetrical and full respiratory excursions Cardiovascular: normal position and quality of the apical impulse, regular rhythm, normal first and second heart sounds, no murmurs, rubs or gallops Abdomen: no tenderness or distention, no masses by palpation, no abnormal pulsatility or arterial bruits, normal bowel sounds, no hepatosplenomegaly Extremities: no clubbing, cyanosis or edema; 2+ radial, ulnar and brachial pulses bilaterally; 2+ right femoral, posterior tibial and dorsalis pedis pulses; 2+ left femoral, posterior tibial and dorsalis pedis pulses; no subclavian or femoral bruits Neurological: grossly nonfocal Psych: Normal mood and affect     Wt Readings from Last 3 Encounters:  01/07/22 160 lb (72.6 kg)  12/28/21 160 lb 4 oz (72.7 kg)  07/09/21 166 lb (75.3 kg)      Studies/Labs Reviewed:   EKG:  EKG is ordered today.  It shows sinus bradycardia with a single PAC, no ischemic repolarization abnormalities, QTc 470 ms. Recent Labs: BMET    Component Value Date/Time   NA 139 12/31/2021 0814   K 4.0 12/31/2021 0814   CL 102 12/31/2021 0814   CO2 26 12/31/2021 0814   GLUCOSE 145 (H) 12/31/2021 0814   GLUCOSE 184 (H) 04/11/2020 1049   BUN 14 12/31/2021 0814   CREATININE 1.14 12/31/2021 0814   CREATININE 1.19 (H) 04/11/2020 1049   CALCIUM 9.3 12/31/2021 0814   GFRNONAA 60 04/11/2020 1049   GFRAA 69 04/11/2020 1049     Lipid Panel     Component Value Date/Time   CHOL 123 12/31/2021 0814   TRIG 201 (H) 12/31/2021 0814   HDL 31 (L) 12/31/2021 0814   CHOLHDL 4.0 12/31/2021 0814   CHOLHDL 4.6 04/11/2020 1049   VLDL 53.0 (H) 12/28/2016 0936   LDLCALC  59 12/31/2021 0814   LDLCALC 86 04/11/2020 1049   LDLDIRECT 74.0 12/28/2016 0936   CBC:    Component Value Date/Time   WBC 8.8 03/21/2018 1202   HGB 15.3 03/21/2018 1202   HGB 14.4 11/28/2017 1150   HCT 44.4 03/21/2018 1202   HCT 42.3 11/28/2017 1150   PLT 267.0 03/21/2018 1202   PLT 161 11/28/2017 1150   MCV 84.0 03/21/2018 1202   MCV 88 11/28/2017 1150   NEUTROABS 11.0 (H) 02/02/2014 0009   LYMPHSABS 0.7 02/02/2014 0009   MONOABS 0.8 02/02/2014 0009   EOSABS 0.0 02/02/2014 0009   BASOSABS 0.0 02/02/2014 0009     ASSESSMENT:    1. 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   2. Chronic diastolic heart failure ([place])   3. Essential hypertension   4. PAD (peripheral artery disease) ([place])   5. Dyslipidemia (high LDL; low HDL)   6. Type 2 diabetes mellitus with diabetic neuropathy, with long-term current use of insulin ([place])   7. Depression, unspecified depression type   8. Cognitive deficits      PLAN:  In order of problems listed above:  CAD: No angina pectoris/asymptomatic ever since his last PCI (DES x2 to SVG-OM) in  March 2019. CHF: Has no symptoms or signs of heart failure.  NYHA functional class I, clinically euvolemic without diuretics.  Has had transient reduction in left ventricular systolic function related to ischemia, last echo showed normal ventricular function.  HTN: Adequate control.  Avoiding beta-blockers due to bradycardia.  On ARB for renal protection in the setting of diabetes mellitus on insulin, amlodipine may have benefit as antianginal. PAD: He does not have claudication.  History of EVAR stent graft for aorto-iliac aneurysm in 2011, being followed in the vascular surgery clinic.  Favorable findings on last scan in July 2022.  HLP: LDL at target, triglycerides have improved a little bit, HDL remains low.  Continue current statin dose. DM: Fair control with hemoglobin A1c improved at 7.3%.  Did not  tolerate SGLT2 inhibitors ([place]). Cognitive issues: He does have some small vessel disease and a peculiar pattern of atrophy involving the frontotemporal region, but no specific diagnosis is firmly established.  Symptomatically he seems improved, although he still has some agitation.  Brilinta did seem to make this anxiety worse. Depression: Not sure how it ties in with his cognitive issues, but he clearly has symptoms of depression.  I believe the low-dose of trazodone that he was prescribed is a great idea and he might actually benefit from increased doses if this helps with sleep and symptoms of depression..    Medication Adjustments/Labs and Tests Ordered: Current medicines are reviewed at length with the patient today.  Concerns regarding medicines are outlined above.  Medication changes, Labs and Tests ordered today are listed in the Patient Instructions below. Patient Instructions  Medication Instructions:  No changes *If you need a refill on your cardiac medications before your next appointment, please call your pharmacy*   Lab Work: None ordered If you have labs (blood work) drawn today and your tests are completely normal, you will receive your results only by: [place] (if you have MyChart) OR A paper copy in the mail If you have any lab test that is abnormal or we need to change your treatment, we will call you to review the results.   Testing/Procedures: None ordered   Follow-Up: At [facility], you and your health needs are our priority.  As part of our continuing mission to provide you with exceptional heart care, we have created designated Provider Care Teams.  These Care Teams include your primary Cardiologist (physician) and Advanced Practice Providers (APPs -  Physician Assistants and Nurse Practitioners) who all work together to provide you with the care you need, when you need it.  We recommend signing up for the patient portal called "MyChart".  Sign  up information is provided on this After Visit Summary.  MyChart is used to connect with patients for Virtual Visits (Telemedicine).  Patients are able to view lab/test results, encounter notes, upcoming appointments, etc.  Non-urgent messages can be sent to your provider as well.   To learn more about what you can do with MyChart, go to [email or web address].    Your next appointment:   12 month(s)  The format for your next appointment:   In Person  Provider:   Sanda Klein, MD {  Important Information About Sugar           Signed, Scott Klein, MD  01/12/2022 4:08 PM    [facility] [place], [place], [place]  [address] Phone: [phone]; Fax: [phone]

## 2022-01-12 ENCOUNTER — Encounter: Payer: Self-pay | Admitting: Cardiovascular Disease

## 2022-01-15 DIAGNOSIS — H43813 Vitreous degeneration, bilateral: Secondary | ICD-10-CM | POA: Diagnosis not present

## 2022-01-15 DIAGNOSIS — E119 Type 2 diabetes mellitus without complications: Secondary | ICD-10-CM | POA: Diagnosis not present

## 2022-01-15 DIAGNOSIS — H524 Presbyopia: Secondary | ICD-10-CM | POA: Diagnosis not present

## 2022-01-15 DIAGNOSIS — H2513 Age-related nuclear cataract, bilateral: Secondary | ICD-10-CM | POA: Diagnosis not present

## 2022-01-15 LAB — HM DIABETES EYE EXAM

## 2022-01-18 ENCOUNTER — Encounter: Payer: Self-pay | Admitting: Family Medicine

## 2022-02-12 ENCOUNTER — Telehealth: Payer: Self-pay | Admitting: Family Medicine

## 2022-02-12 NOTE — Telephone Encounter (Signed)
Left message for patient to call back and schedule Medicare Annual Wellness Visit (AWV) either virtually or in office. Left  my [name] number [phone]   Last AWV 10/13/20  please schedule at anytime with [facility] Nurse Health Advisor 1 or 2   This should be a 45 minute visit.

## 2022-02-16 ENCOUNTER — Telehealth: Payer: Self-pay

## 2022-02-16 ENCOUNTER — Telehealth: Payer: Self-pay | Admitting: Pharmacist

## 2022-02-16 DIAGNOSIS — I10 Essential (primary) hypertension: Secondary | ICD-10-CM

## 2022-02-16 NOTE — Chronic Care Management (AMB) (Signed)
    Chronic Care Management Pharmacy Assistant   Name: [name]  MRN: [id number] DOB: 1945/09/22  02/17/2022 APPOINTMENT [place] wife [name] was reminded to have all medications, supplements and any blood glucose and blood pressure readings available for review with Jeni Salles, Pharm. D, at his telephone visit on 02/17/2022 at 1:30.  Care Gaps: AWV - message to Ramond Craver Last BP - 140/72 on 01/07/2022 Last A1C - 8.6 on 12/25/2018 Covid booster - overdue Flu - flu Shingrix - postponed  Star Rating Drug: Losartan  '50mg'$  - last filled 07/30/2021 90 DS at [facility] verified with Lucy Rosuvastatin '10mg'$  - last filled 11/18/2021 86 DS at [facility]  Any gaps in medications fill history? Yes  [place] Pharmacist Assistant [phone]

## 2022-02-16 NOTE — Telephone Encounter (Signed)
-----   Message from Viona Gilmore, [facility] sent at 02/16/2022 11:14 AM EDT ----- Regarding: CCM referral Hi,  Can you please put in a CCM referral for Mr. Cruces?  Thank you! Maddie

## 2022-02-17 ENCOUNTER — Telehealth: Payer: Medicare Other

## 2022-02-17 NOTE — Progress Notes (Deleted)
Chronic Care Management Pharmacy Note  02/17/2022 Name:  [name] MRN:  [id number] DOB:  [birth year]  Summary: BP not at goal < 130/80 per office readings Pt reports more fatigue lately   Recommendations/Changes made from today's visit: -Recommended restarting losartan 50 mg 2 tablets daily and requested refill from cardiologist -Recommended monitoring of BP at home regularly (at least once weekly) -Recommended discussing with endocrinologist about using a CGM   Plan: BP assessment in 4 weeks  Subjective: [name] is an 76 y.o. year old male who is a primary patient of [name], [name], MD.  The CCM team was consulted for assistance with disease management and care coordination needs.    Engaged with patient by telephone for follow up visit in response to provider referral for pharmacy case management and/or care coordination services.   Consent to Services:  The patient was given information about Chronic Care Management services, agreed to services, and gave verbal consent prior to initiation of services.  Please see initial visit note for detailed documentation.   Patient Care Team: [name], [name], MD as PCP - General (Family Medicine) Croitoru, Dani Gobble, MD as PCP - Cardiology (Cardiology) Sanda Klein, MD as Attending Physician (Cardiology) Sanda Klein, MD as Consulting Physician (Cardiology) Ralene Bathe, MD as Consulting Physician (Ophthalmology) Viona Gilmore, [facility] as Pharmacist (Pharmacist)  Recent office visits: 12/28/21 [name], MD: Patient presented for annual exam. Prescribed trazodone 1/2 tablet daily at bedtime. Follow up in 3 months for anxiety and insomnia.  Recent consult visits: 01/07/22 Croitoru, Dani Gobble, MD Cardiology: Patient presented for CAD follow up. No medication changes. Follow up in 12 months.  11/30/21 [name] Endocrinology: Patient presented for DM follow up.   09/10/21 [name] (ENT) - Patient was seen for  Mixed hearing loss, Bilateral chronic serous otitis media. No other chart notes.   Hospital visits: None in previous 6 months  Objective:  Lab Results  Component Value Date   CREATININE 1.14 12/31/2021   BUN 14 12/31/2021   GFR 64.41 07/03/2018   GFRNONAA 60 04/11/2020   GFRAA 69 04/11/2020   NA 139 12/31/2021   K 4.0 12/31/2021   CALCIUM 9.3 12/31/2021   CO2 26 12/31/2021   GLUCOSE 145 (H) 12/31/2021    Lab Results  Component Value Date/Time   HGBA1C 8.6 (H) 12/25/2018 08:37 AM   HGBA1C 7.5 (A) 07/03/2018 03:58 PM   HGBA1C 6.6 (A) 01/10/2018 02:59 PM   HGBA1C 6.3 06/10/2017 12:39 PM   HGBA1C 6.6 06/15/2016 12:00 AM   FRUCTOSAMINE 236 12/07/2016 11:37 AM   GFR 64.41 07/03/2018 04:37 PM   GFR 53.81 (L) 03/21/2018 12:02 PM   MICROALBUR 2.2 (H) 01/10/2018 03:09 PM   MICROALBUR 1.6 08/16/2016 03:20 PM    Last diabetic Eye exam:  Lab Results  Component Value Date/Time   HMDIABEYEEXA No Retinopathy 01/15/2022 12:00 AM    Last diabetic Foot exam: No results found for: "HMDIABFOOTEX"   Lab Results  Component Value Date   CHOL 123 12/31/2021   HDL 31 (L) 12/31/2021   LDLCALC 59 12/31/2021   LDLDIRECT 74.0 12/28/2016   TRIG 201 (H) 12/31/2021   CHOLHDL 4.0 12/31/2021       Latest Ref Rng & Units 12/31/2021    8:14 AM 04/11/2020   10:49 AM 12/25/2018    8:37 AM  Hepatic Function  Total Protein 6.0 - 8.5 g/dL 6.8  6.9  7.0   Albumin 3.7 - 4.7 g/dL 4.6   4.5  AST 0 - 40 IU/L 33  32  48   ALT 0 - 44 IU/L 12  13  19    Alk Phosphatase 44 - 121 IU/L 87   97   Total Bilirubin 0.0 - 1.2 mg/dL 0.7  0.8  0.8     Lab Results  Component Value Date/Time   TSH 3.12 08/08/2018 04:40 PM   TSH 4.510 (H) 12/20/2017 09:37 AM   FREET4 1.35 12/20/2017 09:37 AM       Latest Ref Rng & Units 03/21/2018   12:02 PM 11/28/2017   11:50 AM 09/27/2017   11:35 AM  CBC  WBC 4.0 - 10.5 K/uL 8.8  7.1  8.5   Hemoglobin 13.0 - 17.0 g/dL 15.3  14.4  14.6   Hematocrit 39.0 - 52.0 % 44.4   42.3  40.9   Platelets 150.0 - 400.0 K/uL 267.0  161  189     No results found for: "VD25OH"  Clinical ASCVD: Yes  The ASCVD Risk score (Arnett DK, et al., 2019) failed to calculate for the following reasons:   The valid total cholesterol range is 130 to 320 mg/dL       12/28/2021    3:02 PM 02/04/2021    9:33 AM 10/13/2020    1:43 PM  Depression screen PHQ 2/9  Decreased Interest 0 0 0  Down, Depressed, Hopeless 0 0 0  PHQ - 2 Score 0 0 0      Social History   Tobacco Use  Smoking Status Former   Types: Cigars   Quit date: 09/09/2017   Years since quitting: 4.4  Smokeless Tobacco Never  Tobacco Comments   states he smoked cigar 1-2 per year.   01/07/2022 patient stated he smoked 1 cigarette before coming to appointment   BP Readings from Last 3 Encounters:  01/07/22 140/72  12/28/21 120/60  07/09/21 130/64   Pulse Readings from Last 3 Encounters:  01/07/22 (!) 56  12/28/21 (!) 56  07/09/21 68   Wt Readings from Last 3 Encounters:  01/07/22 160 lb (72.6 kg)  12/28/21 160 lb 4 oz (72.7 kg)  07/09/21 166 lb (75.3 kg)   BMI Readings from Last 3 Encounters:  01/07/22 23.63 kg/m  12/28/21 23.66 kg/m  07/09/21 24.51 kg/m    Assessment/Interventions: Review of patient past medical history, allergies, medications, health status, including review of consultants reports, laboratory and other test data, was performed as part of comprehensive evaluation and provision of chronic care management services.   SDOH:  (Social Determinants of Health) assessments and interventions performed: Yes   SDOH Screenings   Alcohol Screen: Low Risk  (10/13/2020)   Alcohol Screen    Last Alcohol Screening Score (AUDIT): 0  Depression (PHQ2-9): Low Risk  (12/28/2021)   Depression (PHQ2-9)    PHQ-2 Score: 0  Financial Resource Strain: Medium Risk (11/28/2020)   Overall Financial Resource Strain (CARDIA)    Difficulty of Paying Living Expenses: Somewhat hard  Food Insecurity: No Food  Insecurity (10/13/2020)   Hunger Vital Sign    Worried About Running Out of Food in the Last Year: Never true    Ran Out of Food in the Last Year: Never true  Housing: Low Risk  (10/13/2020)   Housing    Last Housing Risk Score: 0  Physical Activity: Insufficiently Active (10/13/2020)   Exercise Vital Sign    Days of Exercise per Week: 3 days    Minutes of Exercise per Session: 30 min  Social Connections: Socially  Integrated (10/13/2020)   Social Connection and Isolation Panel [NHANES]    Frequency of Communication with Friends and Family: More than three times a week    Frequency of Social Gatherings with Friends and Family: More than three times a week    Attends Religious Services: More than 4 times per year    Active Member of Clubs or Organizations: Not on file    Attends Archivist Meetings: 1 to 4 times per year    Marital Status: Married  Stress: No Stress Concern Present (10/13/2020)   [place]    Feeling of Stress : Not at all  Tobacco Use: Medium Risk (01/12/2022)   Patient History    Smoking Tobacco Use: Former    Smokeless Tobacco Use: Never    Passive Exposure: Not on file  Transportation Needs: No Transportation Needs (11/28/2020)   PRAPARE - Hydrologist (Medical): No    Lack of Transportation (Non-Medical): No     CCM Care Plan  Allergies  Allergen Reactions   Codeine Nausea Only and Other (See Comments)    Cannot take on empty stomach.   Valium Nausea Only and Other (See Comments)    Cannot take on empty stomach.   Empagliflozin Other (See Comments)   Simvastatin Other (See Comments)   Sitagliptin Other (See Comments)    Medications Reviewed Today     Reviewed by Sanda Klein, MD (Physician) on 01/12/22 at 1600  Med List Status: <None>   Medication Order Taking? Sig Documenting Provider Last Dose Status Informant  amLODipine (NORVASC) 10 MG tablet  [id number] Yes TAKE ONE-HALF TABLET BY  MOUTH DAILY Croitoru, Mihai, MD Taking Active   aspirin 81 MG tablet [id number] Yes Take 81 mg by mouth daily. [provider] Taking Active Spouse/Significant Other           Med Note Unk Lightning Sep 08, 2017 12:11 PM) Took 4 - 81 mg tablets today   insulin glargine (LANTUS) 100 UNIT/ML injection [id number] Yes Inject 20 Units into the skin daily. [provider] Taking Active   Insulin Glargine (TOUJEO SOLOSTAR [place]) [id number] No Inject 5 Units into the skin. Prior to meals  Patient not taking: Reported on 01/07/2022   [provider] Not Taking Active   LANTUS SOLOSTAR 100 UNIT/ML Solostar Pen [id number] Yes INJECT SUBCUTANEOUSLY 40  UNITS DAILY  Patient taking differently: Inject 16 Units into the skin daily.   [name], [name], MD Taking Consider Medication Status and Discontinue (Dose change)   losartan (COZAAR) 50 MG tablet [id number] Yes Take 1 tablet (50 mg total) by mouth 2 (two) times daily. Croitoru, Dani Gobble, MD Taking Active            Med Note Marjorie Smolder Jan 07, 2022  2:22 PM) Patient takes 1 tablet daily  ONETOUCH ULTRA test strip [id number] Yes CHECK BLOOD SUGAR 3 TIMES A DAY OR AS DIRECTED [name], [name], MD Taking Active   pantoprazole (PROTONIX) 40 MG tablet [id number] Yes TAKE 1 TABLET BY MOUTH DAILY [name], [name], MD Taking Active            Med Note Marjorie Smolder Jan 07, 2022  2:23 PM) Patient takes 3 times weekly  rosuvastatin (CRESTOR) 10 MG tablet [id number] Yes Take 10 daily 6 times a week Croitoru, Mihai, MD Taking Active   rosuvastatin (CRESTOR) 10 MG  tablet [id number]  Take 10 mg daily 6 times a week Croitoru, Mihai, MD  Consider Medication Status and Discontinue (Duplicate)   traZODone (DESYREL) 50 MG tablet [id number] No Take 0.5 tablets (25 mg total) by mouth at bedtime.  Patient not taking: Reported on 01/07/2022   [name], [name], MD Not Taking Active            Med Note  Marjorie Smolder Jan 07, 2022  2:20 PM) Patient would like to talk with provider before taking  [place] X 6 MM Organ [id number] Yes USE AS DIRECTED [name], [name], MD Taking Active             Patient Active Problem List   Diagnosis Date Noted   Coronary artery disease involving native coronary artery of native heart with angina pectoris ([place]) 12/28/2021   Thyroid nodule 09/08/2018   Tremor of both hands 07/03/2018   Insomnia 07/03/2018   Anxiety disorder 03/21/2018   Nausea without vomiting 01/10/2018   Bradycardia- with decrease of BB some improvement 09/10/2017   S/P angioplasty with stent DES mid SVG to OM and DES to prox. SVG to OM, DAPT for lifetime 09/10/2017   Unstable angina pectoris ([place]) 09/08/2017   Chest pain with moderate risk for cardiac etiology 05/20/2017   Carotid artery disease ([place]) 05/20/2017   Allergic rhinitis 08/16/2016   Chronic combined systolic and diastolic heart failure ([place]) 05/06/2015   Groin pain 08/13/2014   Prolonged Q-T interval on ECG 04/30/2014   Ischemic cardiomyopathy 04/06/2014   Unstable angina (HCC) 04/03/2014   Syncope 02/02/2014   Acute gastroenteritis 02/02/2014   Hypertension    GERD (gastroesophageal reflux disease)    Atrial fibrillation ([place])    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 08/16/2013   Type 2 diabetes mellitus with diabetic neuropathy, unspecified ([place]) 08/16/2013   Hyperlipidemia, mixed 08/16/2013   Aftercare following surgery of the circulatory system, NEC 07/31/2013   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[place]) 07/25/2012    Immunization History  Administered Date(s) Administered   Fluad Quad(high Dose 65+) 04/11/2019, 04/11/2020, 04/28/2021   Influenza, High Dose Seasonal PF 04/30/2014, 04/04/2015, 03/19/2016, 04/15/2017, 04/11/2018   Moderna Sars-Covid-2 Vaccination 08/23/2019, 09/21/2019, 05/20/2020, 01/20/2021   Pneumococcal  Conjugate-13 02/24/2015   Pneumococcal Polysaccharide-23 04/11/2020   Tdap 07/12/2012   Zoster, Live 08/07/2013   Patient reported he hasn't filled out Lantus PAP application yet because he had to have an appointment with endo and he canceled his previous one because of his wife.  Patient reports he is feeling tired all the time and has only been checking his BGs in the morning.  Per cardiology: He describes multiple symptoms of depression, in particular anhedonia.  He does not enjoy meeting people anymore, has not been performing his usual hobbies, "does not feel like doing anything".  Appetite is poor, as is his sleep.   -needs PCP follow up scheduled  -losartan? Taking or no?  BP Readings from Last 3 Encounters:  01/07/22 140/72  12/28/21 120/60  07/09/21 130/64     Conditions to be addressed/monitored:  Hypertension, Hyperlipidemia, Diabetes, Atrial Fibrillation, Coronary Artery Disease and GERD  Conditions addressed this visit: Hypertension, diabetes  There are no care plans that you recently modified to display for this patient.       Medication Assistance: Application for Lantus  medication assistance program. in process.  Anticipated assistance start date 11/28/20.  See plan of care for  additional detail.  Compliance/Adherence/Medication fill history: Care Gaps: Shingrix, COVID booster, foot exam, influenza, A1c Last BP 140/72 on 01/07/2022 Last A1C - 8.6 on 12/25/2018   Star-Rating Drugs: Losartan  61m - last filled 07/30/2021 90 DS at [facility] verified with Lucy Rosuvastatin 153m- last filled 11/18/2021 86 DS at [facility]  Patient's preferred pharmacy is:  [id number]: [phone]: [phone] ([facility]hone: [phone]:  [phone] ([facility]) - [place] [id number]: [phone]: [phone] Uses pill box? Yes Pt endorses 100% compliance - only misses it when he goes into appointments  We discussed: Current pharmacy is preferred with insurance plan and patient is satisfied with pharmacy services Patient decided to: Continue current medication management strategy  Care Plan and Follow Up Patient Decision:  Patient agrees to Care Plan and Follow-up.  Plan: Telephone follow up appointment with care management team member scheduled for:  3 months  MaJeni SallesPharmD BCChesterharmacist [phone]

## 2022-03-05 ENCOUNTER — Telehealth: Payer: Self-pay | Admitting: Pharmacist

## 2022-03-05 NOTE — Chronic Care Management (AMB) (Signed)
    Chronic Care Management Pharmacy Assistant   Name: DEEJAY KOPPELMAN  MRN: [id number] DOB: 03-25-1946  03/08/2022 APPOINTMENT REMINDER  [name] wife was reminded to have all medications, supplements and any blood glucose and blood pressure readings available for review with Jeni Salles, Pharm. D, at his telephone visit on 03/08/2022 at 3:30.   Care Gaps: AWV - previous message to Ramond Craver Last BP - 140/72 on 01/07/2022 Last A1C - 8.6 on 12/25/2018 Covid booster - overdue Flu - due Shingrix - postponed  Star Rating Drug: Losartan  '50mg'$  - last filled 07/20/2021 90 DS at [facility] verified with Tovah Rosuvastatin '10mg'$  - last filled 02/21/2022 100 DS at [facility]  Any gaps in medications fill history? Yes  [place] Pharmacist Assistant [phone]

## 2022-03-08 ENCOUNTER — Telehealth: Payer: Medicare Other

## 2022-03-08 NOTE — Progress Notes (Deleted)
Chronic Care Management Pharmacy Note  03/08/2022 Name:  [name] MRN:  [id number] DOB:  03-Feb-1946  Summary: BP not at goal < 130/80 per office readings Pt reports more fatigue lately   Recommendations/Changes made from today's visit: -Recommended restarting losartan 50 mg 2 tablets daily and requested refill from cardiologist -Recommended monitoring of BP at home regularly (at least once weekly) -Recommended discussing with endocrinologist about using a CGM   Plan: BP assessment in 4 weeks  Subjective: [name] is an 76 y.o. year old male who is a primary patient of [name], [name], MD.  The CCM team was consulted for assistance with disease management and care coordination needs.    Engaged with patient by telephone for follow up visit in response to provider referral for pharmacy case management and/or care coordination services.   Consent to Services:  The patient was given information about Chronic Care Management services, agreed to services, and gave verbal consent prior to initiation of services.  Please see initial visit note for detailed documentation.   Patient Care Team: [name], [name], MD as PCP - General (Family Medicine) Croitoru, Dani Gobble, MD as PCP - Cardiology (Cardiology) Sanda Klein, MD as Attending Physician (Cardiology) Sanda Klein, MD as Consulting Physician (Cardiology) Ralene Bathe, MD as Consulting Physician (Ophthalmology) Viona Gilmore, [facility] as Pharmacist (Pharmacist)  Recent office visits: 12/28/21 [name], MD: Patient presented for annual exam. Prescribed trazodone 1/2 tablet daily at bedtime. Follow up in 3 months for anxiety and insomnia.  Recent consult visits: 01/07/22 Croitoru, Dani Gobble, MD Cardiology: Patient presented for CAD follow up. No medication changes. Follow up in 12 months.  11/30/21 [name] Endocrinology: Patient presented for DM follow up.   09/10/21 [name] (ENT) - Patient was seen for  Mixed hearing loss, Bilateral chronic serous otitis media. No other chart notes.   Hospital visits: None in previous 6 months  Objective:  Lab Results  Component Value Date   CREATININE 1.14 12/31/2021   BUN 14 12/31/2021   GFR 64.41 07/03/2018   GFRNONAA 60 04/11/2020   GFRAA 69 04/11/2020   NA 139 12/31/2021   K 4.0 12/31/2021   CALCIUM 9.3 12/31/2021   CO2 26 12/31/2021   GLUCOSE 145 (H) 12/31/2021    Lab Results  Component Value Date/Time   HGBA1C 8.6 (H) 12/25/2018 08:37 AM   HGBA1C 7.5 (A) 07/03/2018 03:58 PM   HGBA1C 6.6 (A) 01/10/2018 02:59 PM   HGBA1C 6.3 06/10/2017 12:39 PM   HGBA1C 6.6 06/15/2016 12:00 AM   FRUCTOSAMINE 236 12/07/2016 11:37 AM   GFR 64.41 07/03/2018 04:37 PM   GFR 53.81 (L) 03/21/2018 12:02 PM   MICROALBUR 2.2 (H) 01/10/2018 03:09 PM   MICROALBUR 1.6 08/16/2016 03:20 PM    Last diabetic Eye exam:  Lab Results  Component Value Date/Time   HMDIABEYEEXA No Retinopathy 01/15/2022 12:00 AM    Last diabetic Foot exam: No results found for: "HMDIABFOOTEX"   Lab Results  Component Value Date   CHOL 123 12/31/2021   HDL 31 (L) 12/31/2021   LDLCALC 59 12/31/2021   LDLDIRECT 74.0 12/28/2016   TRIG 201 (H) 12/31/2021   CHOLHDL 4.0 12/31/2021       Latest Ref Rng & Units 12/31/2021    8:14 AM 04/11/2020   10:49 AM 12/25/2018    8:37 AM  Hepatic Function  Total Protein 6.0 - 8.5 g/dL 6.8  6.9  7.0   Albumin 3.7 - 4.7 g/dL 4.6   4.5  AST 0 - 40 IU/L 33  32  48   ALT 0 - 44 IU/L 12  13  19    Alk Phosphatase 44 - 121 IU/L 87   97   Total Bilirubin 0.0 - 1.2 mg/dL 0.7  0.8  0.8     Lab Results  Component Value Date/Time   TSH 3.12 08/08/2018 04:40 PM   TSH 4.510 (H) 12/20/2017 09:37 AM   FREET4 1.35 12/20/2017 09:37 AM       Latest Ref Rng & Units 03/21/2018   12:02 PM 11/28/2017   11:50 AM 09/27/2017   11:35 AM  CBC  WBC 4.0 - 10.5 K/uL 8.8  7.1  8.5   Hemoglobin 13.0 - 17.0 g/dL 15.3  14.4  14.6   Hematocrit 39.0 - 52.0 % 44.4   42.3  40.9   Platelets 150.0 - 400.0 K/uL 267.0  161  189     No results found for: "VD25OH"  Clinical ASCVD: Yes  The ASCVD Risk score (Arnett DK, et al., 2019) failed to calculate for the following reasons:   The valid total cholesterol range is 130 to 320 mg/dL       12/28/2021    3:02 PM 02/04/2021    9:33 AM 10/13/2020    1:43 PM  Depression screen PHQ 2/9  Decreased Interest 0 0 0  Down, Depressed, Hopeless 0 0 0  PHQ - 2 Score 0 0 0      Social History   Tobacco Use  Smoking Status Former   Types: Cigars   Quit date: 09/09/2017   Years since quitting: 4.4  Smokeless Tobacco Never  Tobacco Comments   states he smoked cigar 1-2 per year.   01/07/2022 patient stated he smoked 1 cigarette before coming to appointment   BP Readings from Last 3 Encounters:  01/07/22 140/72  12/28/21 120/60  07/09/21 130/64   Pulse Readings from Last 3 Encounters:  01/07/22 (!) 56  12/28/21 (!) 56  07/09/21 68   Wt Readings from Last 3 Encounters:  01/07/22 160 lb (72.6 kg)  12/28/21 160 lb 4 oz (72.7 kg)  07/09/21 166 lb (75.3 kg)   BMI Readings from Last 3 Encounters:  01/07/22 23.63 kg/m  12/28/21 23.66 kg/m  07/09/21 24.51 kg/m    Assessment/Interventions: Review of patient past medical history, allergies, medications, health status, including review of consultants reports, laboratory and other test data, was performed as part of comprehensive evaluation and provision of chronic care management services.   SDOH:  (Social Determinants of Health) assessments and interventions performed: Yes   SDOH Screenings   Alcohol Screen: Low Risk  (10/13/2020)   Alcohol Screen    Last Alcohol Screening Score (AUDIT): 0  Depression (PHQ2-9): Low Risk  (12/28/2021)   Depression (PHQ2-9)    PHQ-2 Score: 0  Financial Resource Strain: Medium Risk (11/28/2020)   Overall Financial Resource Strain (CARDIA)    Difficulty of Paying Living Expenses: Somewhat hard  Food Insecurity: No Food  Insecurity (10/13/2020)   Hunger Vital Sign    Worried About Running Out of Food in the Last Year: Never true    Ran Out of Food in the Last Year: Never true  Housing: Low Risk  (10/13/2020)   Housing    Last Housing Risk Score: 0  Physical Activity: Insufficiently Active (10/13/2020)   Exercise Vital Sign    Days of Exercise per Week: 3 days    Minutes of Exercise per Session: 30 min  Social Connections: Socially  Integrated (10/13/2020)   Social Connection and Isolation Panel [NHANES]    Frequency of Communication with Friends and Family: More than three times a week    Frequency of Social Gatherings with Friends and Family: More than three times a week    Attends Religious Services: More than 4 times per year    Active Member of Clubs or Organizations: Not on file    Attends Archivist Meetings: 1 to 4 times per year    Marital Status: Married  Stress: No Stress Concern Present (10/13/2020)   [place]    Feeling of Stress : Not at all  Tobacco Use: Medium Risk (01/12/2022)   Patient History    Smoking Tobacco Use: Former    Smokeless Tobacco Use: Never    Passive Exposure: Not on file  Transportation Needs: No Transportation Needs (11/28/2020)   PRAPARE - Hydrologist (Medical): No    Lack of Transportation (Non-Medical): No     CCM Care Plan  Allergies  Allergen Reactions   Codeine Nausea Only and Other (See Comments)    Cannot take on empty stomach.   Valium Nausea Only and Other (See Comments)    Cannot take on empty stomach.   Empagliflozin Other (See Comments)   Simvastatin Other (See Comments)   Sitagliptin Other (See Comments)    Medications Reviewed Today     Reviewed by Sanda Klein, MD (Physician) on 01/12/22 at 1600  Med List Status: <None>   Medication Order Taking? Sig Documenting Provider Last Dose Status Informant  amLODipine (NORVASC) 10 MG tablet  [id number] Yes TAKE ONE-HALF TABLET BY  MOUTH DAILY Croitoru, Mihai, MD Taking Active   aspirin 81 MG tablet [id number] Yes Take 81 mg by mouth daily. [provider] Taking Active Spouse/Significant Other           Med Note Unk Lightning Sep 08, 2017 12:11 PM) Took 4 - 81 mg tablets today   insulin glargine (LANTUS) 100 UNIT/ML injection [id number] Yes Inject 20 Units into the skin daily. [provider] Taking Active   Insulin Glargine (TOUJEO SOLOSTAR [place]) [id number] No Inject 5 Units into the skin. Prior to meals  Patient not taking: Reported on 01/07/2022   [provider] Not Taking Active   LANTUS SOLOSTAR 100 UNIT/ML Solostar Pen [id number] Yes INJECT SUBCUTANEOUSLY 40  UNITS DAILY  Patient taking differently: Inject 16 Units into the skin daily.   [name], [name], MD Taking Consider Medication Status and Discontinue (Dose change)   losartan (COZAAR) 50 MG tablet [id number] Yes Take 1 tablet (50 mg total) by mouth 2 (two) times daily. Croitoru, Dani Gobble, MD Taking Active            Med Note Marjorie Smolder Jan 07, 2022  2:22 PM) Patient takes 1 tablet daily  ONETOUCH ULTRA test strip [id number] Yes CHECK BLOOD SUGAR 3 TIMES A DAY OR AS DIRECTED [name], [name], MD Taking Active   pantoprazole (PROTONIX) 40 MG tablet [id number] Yes TAKE 1 TABLET BY MOUTH DAILY [name], [name], MD Taking Active            Med Note Marjorie Smolder Jan 07, 2022  2:23 PM) Patient takes 3 times weekly  rosuvastatin (CRESTOR) 10 MG tablet [id number] Yes Take 10 daily 6 times a week Croitoru, Mihai, MD Taking Active   rosuvastatin (CRESTOR) 10 MG  tablet [id number]  Take 10 mg daily 6 times a week Croitoru, Mihai, MD  Consider Medication Status and Discontinue (Duplicate)   traZODone (DESYREL) 50 MG tablet [id number] No Take 0.5 tablets (25 mg total) by mouth at bedtime.  Patient not taking: Reported on 01/07/2022   [name], [name], MD Not Taking Active            Med Note  Marjorie Smolder Jan 07, 2022  2:20 PM) Patient would like to talk with provider before taking  [place] X 6 MM [place] [id number] Yes USE AS DIRECTED [name], [name], MD Taking Active             Patient Active Problem List   Diagnosis Date Noted   Coronary artery disease involving native coronary artery of native heart with angina pectoris ([place]) 12/28/2021   Thyroid nodule 09/08/2018   Tremor of both hands 07/03/2018   Insomnia 07/03/2018   Anxiety disorder 03/21/2018   Nausea without vomiting 01/10/2018   Bradycardia- with decrease of BB some improvement 09/10/2017   S/P angioplasty with stent DES mid SVG to OM and DES to prox. SVG to OM, DAPT for lifetime 09/10/2017   Unstable angina pectoris ([place]) 09/08/2017   Chest pain with moderate risk for cardiac etiology 05/20/2017   Carotid artery disease ([place]) 05/20/2017   Allergic rhinitis 08/16/2016   Chronic combined systolic and diastolic heart failure ([place]) 05/06/2015   Groin pain 08/13/2014   Prolonged Q-T interval on ECG 04/30/2014   Ischemic cardiomyopathy 04/06/2014   Unstable angina (HCC) 04/03/2014   Syncope 02/02/2014   Acute gastroenteritis 02/02/2014   Hypertension    GERD (gastroesophageal reflux disease)    Atrial fibrillation ([place])    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 08/16/2013   Type 2 diabetes mellitus with diabetic neuropathy, unspecified ([place]) 08/16/2013   Hyperlipidemia, mixed 08/16/2013   Aftercare following surgery of the circulatory system, NEC 07/31/2013   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[place]) 07/25/2012    Immunization History  Administered Date(s) Administered   Fluad Quad(high Dose 65+) 04/11/2019, 04/11/2020, 04/28/2021   Influenza, High Dose Seasonal PF 04/30/2014, 04/04/2015, 03/19/2016, 04/15/2017, 04/11/2018   Moderna Sars-Covid-2 Vaccination 08/23/2019, 09/21/2019, 05/20/2020, 01/20/2021   Pneumococcal  Conjugate-13 02/24/2015   Pneumococcal Polysaccharide-23 04/11/2020   Tdap 07/12/2012   Zoster, Live 08/07/2013   Patient reported he hasn't filled out Lantus PAP application yet because he had to have an appointment with endo and he canceled his previous one because of his wife.  Patient reports he is feeling tired all the time and has only been checking his BGs in the morning.  Per cardiology: He describes multiple symptoms of depression, in particular anhedonia.  He does not enjoy meeting people anymore, has not been performing his usual hobbies, "does not feel like doing anything".  Appetite is poor, as is his sleep.   -needs PCP follow up scheduled - Return in 3 months (on 03/30/2022) for anxiety and insomnia.  -losartan? Taking or no?  -cgm?  BP Readings from Last 3 Encounters:  01/07/22 140/72  12/28/21 120/60  07/09/21 130/64    Conditions to be addressed/monitored:  Hypertension, Hyperlipidemia, Diabetes, Atrial Fibrillation, Coronary Artery Disease and GERD  Conditions addressed this visit: Hypertension, diabetes  There are no care plans that you recently modified to display for this patient.       Medication Assistance: Application for Lantus  medication assistance program. in process.  Anticipated assistance start date 11/28/20.  See plan of care for additional detail.  Compliance/Adherence/Medication fill history: Care Gaps: Shingrix, COVID booster, foot exam, influenza, A1c Last BP 140/72 on 01/07/2022 Last A1C - 8.6 on 12/25/2018   Star-Rating Drugs: Losartan  69m - last filled 07/30/2021 90 DS at [facility] verified with Lucy Rosuvastatin 141m- last filled 11/18/2021 86 DS at [facility]  Patient's preferred pharmacy is:  [address]hone: [phone]: [phone] ([facility]hone: [phone]: [phone] ([facility]) - [place] [address]hone: [phone]: [phone] Uses pill box? Yes Pt endorses 100% compliance - only misses it when he goes into appointments  We discussed: Current pharmacy is preferred with insurance plan and patient is satisfied with pharmacy services Patient decided to: Continue current medication management strategy  Care Plan and Follow Up Patient Decision:  Patient agrees to Care Plan and Follow-up.  Plan: Telephone follow up appointment with care management team member scheduled for:  3 months  MaJeni SallesPharmD BCSulphur Springsharmacist [place] [phone]

## 2022-03-11 ENCOUNTER — Ambulatory Visit (INDEPENDENT_AMBULATORY_CARE_PROVIDER_SITE_OTHER): Payer: Medicare Other

## 2022-03-11 VITALS — Ht 69.0 in | Wt 153.0 lb

## 2022-03-11 DIAGNOSIS — Z0000 Encounter for general adult medical examination without abnormal findings: Secondary | ICD-10-CM

## 2022-03-11 NOTE — Patient Instructions (Addendum)
[name] , Thank you for taking time to come for your Medicare Wellness Visit. I appreciate your ongoing commitment to your health goals. Please review the following plan we discussed and let me know if I can assist you in the future.   Screening recommendations/referrals: Colonoscopy: No longer required Recommended yearly ophthalmology/optometry visit for glaucoma screening and checkup Recommended yearly dental visit for hygiene and checkup  Vaccinations: Influenza vaccine: Up to date Pneumococcal vaccine: Up to date Tdap vaccine: Up to date Shingles vaccine: Deferred   Covid-19: Done  Advanced directives: Please bring a copy of your health care power of attorney and living will to the office to be added to your chart at your convenience.   Conditions/risks identified: None  Next appointment: Follow up in one year for your annual wellness visit.      Preventive Care 76 Years and Older, Male  Preventive care refers to lifestyle choices and visits with your health care provider that can promote health and wellness. What does preventive care include? A yearly physical exam. This is also called an annual well check. Dental exams once or twice a year. Routine eye exams. Ask your health care provider how often you should have your eyes checked. Personal lifestyle choices, including: Daily care of your teeth and gums. Regular physical activity. Eating a healthy diet. Avoiding tobacco and drug use. Limiting alcohol use. Practicing safe sex. Taking low doses of aspirin every day. Taking vitamin and mineral supplements as recommended by your health care provider. What happens during an annual well check? The services and screenings done by your health care provider during your annual well check will depend on your age, overall health, lifestyle risk factors, and family history of disease. Counseling  Your health care provider may ask you questions about your: Alcohol use. Tobacco  use. Drug use. Emotional well-being. Home and relationship well-being. Sexual activity. Eating habits. History of falls. Memory and ability to understand (cognition). Work and work Statistician. Screening  You may have the following tests or measurements: Height, weight, and BMI. Blood pressure. Lipid and cholesterol levels. These may be checked every 5 years, or more frequently if you are over 76 years old. Skin check. Lung cancer screening. You may have this screening every year starting at age 76 if you have a 30-pack-year history of smoking and currently smoke or have quit within the past 15 years. Fecal occult blood test (FOBT) of the stool. You may have this test every year starting at age 76. Flexible sigmoidoscopy or colonoscopy. You may have a sigmoidoscopy every 5 years or a colonoscopy every 10 years starting at age 76. Prostate cancer screening. Recommendations will vary depending on your family history and other risks. Hepatitis C blood test. Hepatitis B blood test. Sexually transmitted disease (STD) testing. Diabetes screening. This is done by checking your blood sugar (glucose) after you have not eaten for a while (fasting). You may have this done every 1-3 years. Abdominal aortic aneurysm (AAA) screening. You may need this if you are a current or former smoker. Osteoporosis. You may be screened starting at age 76 if you are at high risk. Talk with your health care provider about your test results, treatment options, and if necessary, the need for more tests. Vaccines  Your health care provider may recommend certain vaccines, such as: Influenza vaccine. This is recommended every year. Tetanus, diphtheria, and acellular pertussis (Tdap, Td) vaccine. You may need a Td booster every 10 years. Zoster vaccine. You may need this  after age 76. Pneumococcal 13-valent conjugate (PCV13) vaccine. One dose is recommended after age 76. Pneumococcal polysaccharide (PPSV23) vaccine.  One dose is recommended after age 70. Talk to your health care provider about which screenings and vaccines you need and how often you need them. This information is not intended to replace advice given to you by your health care provider. Make sure you discuss any questions you have with your health care provider. Document Released: 07/25/2015 Document Revised: 03/17/2016 Document Reviewed: 04/29/2015 Elsevier Interactive Patient Education  2017 [place] Prevention in the Home Falls can cause injuries. They can happen to people of all ages. There are many things you can do to make your home safe and to help prevent falls. What can I do on the outside of my home? Regularly fix the edges of walkways and driveways and fix any cracks. Remove anything that might make you trip as you walk through a door, such as a raised step or threshold. Trim any bushes or trees on the path to your home. Use bright outdoor lighting. Clear any walking paths of anything that might make someone trip, such as rocks or tools. Regularly check to see if handrails are loose or broken. Make sure that both sides of any steps have handrails. Any raised decks and porches should have guardrails on the edges. Have any leaves, snow, or ice cleared regularly. Use sand or salt on walking paths during winter. Clean up any spills in your garage right away. This includes oil or grease spills. What can I do in the bathroom? Use night lights. Install grab bars by the toilet and in the tub and shower. Do not use towel bars as grab bars. Use non-skid mats or decals in the tub or shower. If you need to sit down in the shower, use a plastic, non-slip stool. Keep the floor dry. Clean up any water that spills on the floor as soon as it happens. Remove soap buildup in the tub or shower regularly. Attach bath mats securely with double-sided non-slip rug tape. Do not have throw rugs and other things on the floor that can make  you trip. What can I do in the bedroom? Use night lights. Make sure that you have a light by your bed that is easy to reach. Do not use any sheets or blankets that are too big for your bed. They should not hang down onto the floor. Have a firm chair that has side arms. You can use this for support while you get dressed. Do not have throw rugs and other things on the floor that can make you trip. What can I do in the kitchen? Clean up any spills right away. Avoid walking on wet floors. Keep items that you use a lot in easy-to-reach places. If you need to reach something above you, use a strong step stool that has a grab bar. Keep electrical cords out of the way. Do not use floor polish or wax that makes floors slippery. If you must use wax, use non-skid floor wax. Do not have throw rugs and other things on the floor that can make you trip. What can I do with my stairs? Do not leave any items on the stairs. Make sure that there are handrails on both sides of the stairs and use them. Fix handrails that are broken or loose. Make sure that handrails are as long as the stairways. Check any carpeting to make sure that it is firmly attached to the  stairs. Fix any carpet that is loose or worn. Avoid having throw rugs at the top or bottom of the stairs. If you do have throw rugs, attach them to the floor with carpet tape. Make sure that you have a light switch at the top of the stairs and the bottom of the stairs. If you do not have them, ask someone to add them for you. What else can I do to help prevent falls? Wear shoes that: Do not have high heels. Have rubber bottoms. Are comfortable and fit you well. Are closed at the toe. Do not wear sandals. If you use a stepladder: Make sure that it is fully opened. Do not climb a closed stepladder. Make sure that both sides of the stepladder are locked into place. Ask someone to hold it for you, if possible. Clearly mark and make sure that you can  see: Any grab bars or handrails. First and last steps. Where the edge of each step is. Use tools that help you move around (mobility aids) if they are needed. These include: Canes. Walkers. Scooters. Crutches. Turn on the lights when you go into a dark area. Replace any light bulbs as soon as they burn out. Set up your furniture so you have a clear path. Avoid moving your furniture around. If any of your floors are uneven, fix them. If there are any pets around you, be aware of where they are. Review your medicines with your doctor. Some medicines can make you feel dizzy. This can increase your chance of falling. Ask your doctor what other things that you can do to help prevent falls. This information is not intended to replace advice given to you by your health care provider. Make sure you discuss any questions you have with your health care provider. Document Released: 04/24/2009 Document Revised: 12/04/2015 Document Reviewed: 08/02/2014 Elsevier Interactive Patient Education  2017 [employer].

## 2022-03-11 NOTE — Progress Notes (Signed)
Subjective:   [name] is a 76 y.o. male who presents for Medicare Annual/Subsequent preventive examination.  Review of Systems    Virtual Visit via Telephone Note  I connected with  [name] on 03/11/22 at  3:15 PM EDT by telephone and verified that I am speaking with the correct person using two identifiers.  Location: Patient: Home Provider: Office Persons participating in the virtual visit: patient/Nurse Health Advisor   I discussed the limitations, risks, security and privacy concerns of performing an evaluation and management service by telephone and the availability of in person appointments. The patient expressed understanding and agreed to proceed.  Interactive audio and video telecommunications were attempted between this nurse and patient, however failed, due to patient having technical difficulties OR patient did not have access to video capability.  We continued and completed visit with audio only.  Some vital signs may be absent or patient reported.   Criselda Peaches, LPN  Cardiac Risk Factors include: advanced age (>13mn, >>49women);diabetes mellitus;male gender;smoking/ tobacco exposure     Objective:    Today's Vitals   03/11/22 1411  Weight: 153 lb (69.4 kg)  Height: '5\' 9"'$  (1.753 m)   Body mass index is 22.59 kg/m.     03/11/2022    2:18 PM 02/04/2021    9:33 AM 10/13/2020    1:40 PM 01/24/2018    9:51 AM 01/18/2018    3:34 PM 09/08/2017   12:16 PM 09/07/2016    9:10 AM  Advanced Directives  Does Patient Have a Medical Advance Directive? Yes Yes No No No Yes No  Type of [place]    Living will;Healthcare Power of Attorney   Does patient want to make changes to medical advance directive?      No - Patient declined   Copy of [place] Chart? No - copy requested Yes - validated most recent copy scanned in chart (See row information)     No - copy requested   Would patient like information on creating a medical advance directive?   No - Patient declined No - Patient declined   No - Patient declined    Current Medications (verified) Outpatient Encounter Medications as of 03/11/2022  Medication Sig   amLODipine (NORVASC) 10 MG tablet TAKE ONE-HALF TABLET BY  MOUTH DAILY   aspirin 81 MG tablet Take 81 mg by mouth daily.   insulin glargine (LANTUS) 100 UNIT/ML injection Inject 20 Units into the skin daily.   losartan (COZAAR) 50 MG tablet Take 1 tablet (50 mg total) by mouth 2 (two) times daily.   ONETOUCH ULTRA test strip CHECK BLOOD SUGAR 3 TIMES A DAY OR AS DIRECTED   pantoprazole (PROTONIX) 40 MG tablet TAKE 1 TABLET BY MOUTH DAILY   rosuvastatin (CRESTOR) 10 MG tablet Take 10 daily 6 times a week   traZODone (DESYREL) 50 MG tablet Take 0.5 tablets (25 mg total) by mouth at bedtime. (Patient not taking: Reported on 01/07/2022)   ULTICARE MINI PEN NEEDLES 31G X 6 MM MISC USE AS DIRECTED   No facility-administered encounter medications on file as of 03/11/2022.    Allergies (verified) Codeine, Valium, Empagliflozin, Simvastatin, and Sitagliptin   History: Past Medical History:  Diagnosis Date   Anxiety    Arthritis    "back" (04/05/2014)   Atrial fibrillation (HCC)    Blood in stool    Bradycardia- with decrease of BB  some improvement 09/10/2017   CAD (coronary artery disease)    CAD s/p CABG 2006 08/16/2013   a. Dr Darcey Nora - to LAD, SVG to diagonal, SVG to OM, SVG to PDA ;  b.  [facility] (9/15): ostial LAD occl, ostial CFX 60%, prox AVCFX 95%, RCA 95%, S-PDA occl, S-OM/Dx with Dx limb occl and prox 60%, L-LAD patent, EF 35-40% wtih ant-lat and apical HK-AK >> PCI:  BMS to native RCA    Diverticulosis    Dizziness    DM type 2 causing complication ([place]) 08/14/6331   GERD (gastroesophageal reflux disease)    Hypertension    Iliac artery aneurysm ([place])    Ischemic cardiomyopathy    a. EF 35-40% by LV gram 03/2014   Joint pain     Kidney stones    Kidney stones    Lower back pain Jan 2015   Mixed hyperlipidemia 08/16/2013   Myocardial infarction ([place]) 03/2014 X 2?   S/P angioplasty with stent DES mid SVG to OM and DES to prox. SVG to OM, DAPT for lifetime 09/10/2017   Type II diabetes mellitus [facility])    Past Surgical History:  Procedure Laterality Date   CARDIAC CATHETERIZATION  2006   "before OHS"   CORONARY ANGIOPLASTY WITH STENT PLACEMENT  04/05/2014   "1"   CORONARY ARTERY BYPASS GRAFT  2006   LIMA to LAD,SVG to diagonal,SVG to obtuse marginal,SVG to posterior descending   CORONARY STENT INTERVENTION N/A 09/09/2017   Procedure: CORONARY STENT INTERVENTION;  Surgeon: Burnell Blanks, MD;  Location: [place] [facility];  Service: Cardiovascular;  Laterality: N/A;   CORONARY STENT PLACEMENT  09/09/2017   CYSTOSCOPY W/ STONE MANIPULATION  1970's X 2   CYSTOSCOPY W/ URETEROSCOPY W/ LITHOTRIPSY  ~ 2009   ILIAC ARTERY STENT Left Aug. 10, 2011   Aorta to right ext iliac and left CIA stent repair    LEFT HEART CATH AND CORS/GRAFTS ANGIOGRAPHY N/A 09/08/2017   Procedure: LEFT HEART CATH AND CORS/GRAFTS ANGIOGRAPHY;  Surgeon: [name], MD;  Location: [place] [facility];  Service: Cardiovascular;  Laterality: N/A;   LEFT HEART CATHETERIZATION WITH CORONARY/GRAFT ANGIOGRAM N/A 04/05/2014   Procedure: LEFT HEART CATHETERIZATION WITH [name];  Surgeon: [name], MD;  Location[facility];  Service: Cardiovascular;  Laterality: N/A;   NM MYOCAR PERF WALL MOTION  11/18/2009   low risk   TONSILLECTOMY AND ADENOIDECTOMY  1951   Family History  Problem Relation Age of Onset   Diabetes Mother    Hypertension Mother    Heart disease Mother        AAA   Heart disease Sister    Heart disease Son        Heart Disease before age 64   Diabetes Brother    Thyroid disease Neg Hx    Social History   Socioeconomic History   Marital status: Married    Spouse name: Not on file   Number of  children: 3   Years of education: Not on file   Highest education level: Not on file  Occupational History   Not on file  Tobacco Use   Smoking status: Former    Types: Cigars    Quit date: 09/09/2017    Years since quitting: 4.5   Smokeless tobacco: Never   Tobacco comments:    states he smoked cigar 1-2 per year.    01/07/2022 patient stated he smoked 1 cigarette before coming to appointment  Vaping Use  Vaping Use: Never used  Substance and Sexual Activity   Alcohol use: No   Drug use: No   Sexual activity: Yes  Other Topics Concern   Not on file  Social History Narrative   Lives with wife   Caffeine use: Soda sometimes   1/2 cup coffee in the morning sometimes   Right handed    Social Determinants of Health   Financial Resource Strain: Low Risk  (03/11/2022)   Overall Financial Resource Strain (CARDIA)    Difficulty of Paying Living Expenses: Not hard at all  Food Insecurity: No Food Insecurity (03/11/2022)   Hunger Vital Sign    Worried About Running Out of Food in the Last Year: Never true    Ran Out of Food in the Last Year: Never true  Transportation Needs: No Transportation Needs (03/11/2022)   PRAPARE - Hydrologist (Medical): No    Lack of Transportation (Non-Medical): No  Physical Activity: Sufficiently Active (03/11/2022)   Exercise Vital Sign    Days of Exercise per Week: 6 days    Minutes of Exercise per Session: 150+ min  Stress: No Stress Concern Present (03/11/2022)   [place]    Feeling of Stress : Not at all  Social Connections: [place] (03/11/2022)   Social Connection and Isolation Panel [NHANES]    Frequency of Communication with Friends and Family: More than three times a week    Frequency of Social Gatherings with Friends and Family: More than three times a week    Attends Religious Services: More than 4 times per year    Active Member of  [employer] or Organizations: Yes    Attends Music therapist: More than 4 times per year    Marital Status: Married    Tobacco Counseling Counseling given: Yes Tobacco comments: states he smoked cigar 1-2 per year. 01/07/2022 patient stated he smoked 1 cigarette before coming to appointment Patient stated smokes less now than last encounter.  Clinical Intake:  Pre-visit preparation completed: NoNutrition Risk Assessment:  Has the patient had any N/V/D within the last 2 months?  No  Does the patient have any non-healing wounds?  No  Has the patient had any unintentional weight loss or weight gain?  No   Diabetes:  Is the patient diabetic?  Yes  If diabetic, was a CBG obtained today?  Yes  CBG 124 Taken by patient Did the patient bring in their glucometer from home?  No  How often do you monitor your CBG's? Daily.   Financial Strains and Diabetes Management:  Are you having any financial strains with the device, your supplies or your medication? No .  Does the patient want to be seen by Chronic Care Management for management of their diabetes?  No  Would the patient like to be referred to a Nutritionist or for Diabetic Management?  No  Diabetic Exams:  Diabetic Eye Exam: Completed No. Overdue for diabetic eye exam. Pt has been advised about the importance in completing this exam. A referral has been placed today. Message sent to referral coordinator for scheduling purposes. Advised pt to expect a call from office referred to regarding appt.  Diabetic Foot Exam: Completed No. Pt has been advised about the importance in completing this exam. Pt is scheduled for diabetic foot exam on Followed by PCP.    Pain : No/denies pain     BMI - recorded: 22.59 Nutritional Status: BMI  of 19-24  Normal Nutritional Risks: None Diabetes: Yes CBG done?: Yes CBG resulted in Enter/ Edit results?: Yes (CBG 124 Taken by patient)  How often do you need to have someone help you when you  read instructions, pamphlets, or other written materials from your doctor or pharmacy?: 1 - Never  Diabetic?  Yes  Interpreter Needed?: No  Information entered by :: Rolene Arbour LPN   Activities of Daily Living    03/11/2022    2:17 PM  In your present state of health, do you have any difficulty performing the following activities:  Hearing? 0  Vision? 0  Difficulty concentrating or making decisions? 0  Walking or climbing stairs? 0  Dressing or bathing? 0  Doing errands, shopping? 0  Preparing Food and eating ? N  Using the Toilet? N  In the past six months, have you accidently leaked urine? N  Do you have problems with loss of bowel control? N  Managing your Medications? N  Managing your Finances? N  Housekeeping or managing your Housekeeping? N    Patient Care Team: [name], [name], MD as PCP - General (Family Medicine) Croitoru, Dani Gobble, MD as PCP - Cardiology (Cardiology) Sanda Klein, MD as Attending Physician (Cardiology) Sanda Klein, MD as Consulting Physician (Cardiology) Ralene Bathe, MD as Consulting Physician (Ophthalmology) Viona Gilmore, [facility] as Pharmacist (Pharmacist)  Indicate any recent Medical Services you may have received from other than [facility] providers in the past year (date may be approximate).     Assessment:   This is a routine wellness examination for [name].  Hearing/Vision screen Hearing Screening - Comments:: Denies hearing difficulties   Vision Screening - Comments:: Wears rx glasses - up to date with routine eye exams with  Coralie Keens  Dietary issues and exercise activities discussed: Current Exercise Habits: Home exercise routine, Type of exercise: walking, Time (Minutes): > 60, Frequency (Times/Week): 6, Weekly Exercise (Minutes/Week): 0, Intensity: Moderate, Exercise limited by: None identified   Goals Addressed               This Visit's Progress     Stay healthy (pt-stated)         Depression Screen     03/11/2022    2:16 PM 12/28/2021    3:02 PM 02/04/2021    9:33 AM 10/13/2020    1:43 PM 10/13/2020    1:42 PM 10/13/2020    1:38 PM 08/08/2018    4:56 PM  PHQ 2/9 Scores  PHQ - 2 Score 0 0 0 0 0 0 6  PHQ- 9 Score       14    Fall Risk    03/11/2022    2:18 PM 12/28/2021    3:02 PM 02/04/2021    9:33 AM 10/13/2020    1:41 PM 04/03/2018    7:59 AM  Fall Risk   Falls in the past year? 0 0 0 0 Yes  Number falls in past yr: 0 0  0 1  Injury with Fall? 0 0  0 Yes  Risk for fall due to : No Fall Risks No Fall Risks     Follow up Falls prevention discussed Falls evaluation completed       FALL RISK PREVENTION PERTAINING TO THE HOME:  Any stairs in or around the home? No  If so, are there any without handrails? No  Home free of loose throw rugs in walkways, pet beds, electrical cords, etc? Yes  Adequate lighting in your home to reduce  risk of falls? Yes   ASSISTIVE DEVICES UTILIZED TO PREVENT FALLS:  Life alert? No  Use of a cane, walker or w/c? No  Grab bars in the bathroom? Yes Shower chair or bench in shower? No  Elevated toilet seat or a handicapped toilet? Yes   TIMED UP AND GO:  Was the test performed? No . Audio Visit  Cognitive Function:    04/03/2018    8:39 AM  MMSE - Mini Mental State Exam  Orientation to time 5  Orientation to Place 4  Registration 3  Attention/ Calculation 1  Recall 2  Language- name 2 objects 2  Language- repeat 0  Language- follow 3 step command 2  Language- read & follow direction 1  Write a sentence 1  Copy design 0  Total score 21        03/11/2022    2:18 PM  6CIT Screen  What Year? 0 points  What month? 0 points  What time? 0 points  Count back from 20 0 points  Months in reverse 0 points  Repeat phrase 0 points  Total Score 0 points    Immunizations Immunization History  Administered Date(s) Administered   Fluad Quad(high Dose 65+) 04/11/2019, 04/11/2020, 04/28/2021   Influenza, High Dose Seasonal PF 04/30/2014,  04/04/2015, 03/19/2016, 04/15/2017, 04/11/2018   Moderna Sars-Covid-2 Vaccination 08/23/2019, 09/21/2019, 05/20/2020, 01/20/2021   Pneumococcal Conjugate-13 02/24/2015   Pneumococcal Polysaccharide-23 04/11/2020   Tdap 07/12/2012   Zoster, Live 08/07/2013    TDAP status: Up to date  Flu Vaccine status: Up to date  Pneumococcal vaccine status: Up to date  Covid-19 vaccine status: Completed vaccines  Qualifies for Shingles Vaccine? Yes   Zostavax completed No   Shingrix Completed?: No.    Education has been provided regarding the importance of this vaccine. Patient has been advised to call insurance company to determine out of pocket expense if they have not yet received this vaccine. Advised may also receive vaccine at local pharmacy or Health Dept. Verbalized acceptance and understanding.  Screening Tests Health Maintenance  Topic Date Due   INFLUENZA VACCINE  02/09/2022   COVID-19 Vaccine (5 - Moderna series) 03/27/2022 (Originally 03/17/2021)   Zoster Vaccines- Shingrix (1 of 2) 07/16/2022 (Originally 01/20/1996)   HEMOGLOBIN A1C  04/29/2022   TETANUS/TDAP  07/12/2022   FOOT EXAM  10/29/2022   OPHTHALMOLOGY EXAM  01/16/2023   Pneumonia Vaccine 42+ Years old  Completed   Hepatitis C Screening  Completed   HPV VACCINES  Aged Out   COLONOSCOPY (Pts 45-80yr Insurance coverage will need to be confirmed)  Discontinued    Health Maintenance  Health Maintenance Due  Topic Date Due   INFLUENZA VACCINE  02/09/2022    Colorectal cancer screening: No longer required.   Lung Cancer Screening: (Low Dose CT Chest recommended if Age 76-80years, 30 pack-year currently smoking OR have quit w/in 15years.) does not qualify.     Additional Screening:  Hepatitis C Screening: does qualify; Completed   Vision Screening: Recommended annual ophthalmology exams for early detection of glaucoma and other disorders of the eye. Is the patient up to date with their annual eye exam?  Yes  Who  is the provider or what is the name of the office in which the patient attends annual eye exams[facility] is not established with a provider, would they like to be referred to a provider to establish care? No .   Dental Screening: Recommended annual dental exams for proper oral  hygiene  Community Resource Referral / Chronic Care Management:  CRR required this visit?  No   CCM required this visit?  No      Plan:     I have personally reviewed and noted the following in the patient's chart:   Medical and social history Use of alcohol, tobacco or illicit drugs  Current medications and supplements including opioid prescriptions. Patient is not currently taking opioid prescriptions. Functional ability and status Nutritional status Physical activity Advanced directives List of other physicians Hospitalizations, surgeries, and ER visits in previous 12 months Vitals Screenings to include cognitive, depression, and falls Referrals and appointments  In addition, I have reviewed and discussed with patient certain preventive protocols, quality metrics, and best practice recommendations. A written personalized care plan for preventive services as well as general preventive health recommendations were provided to patient.     Criselda Peaches, LPN   3/55/7322   Nurse Notes: None

## 2022-03-15 NOTE — Progress Notes (Signed)
Chronic Care Management Pharmacy Note  03/18/2022 Name:  [name] MRN:  [id number] DOB:  1946/02/14  Summary: BP not at goal < 130/80 per office readings Pt reports more fatigue lately and still not sleeping well   Recommendations/Changes made from today's visit: -Recommended restarting losartan 50 mg and requested refill from cardiologist -Recommended monitoring of BP at home regularly (at least once weekly) -Recommended trial of timed release melatonin 2 or 3 mg per night -Recommended discussing with endocrinologist about using a CGM   Plan: Scheduled insomnia follow up with PCP BP assessment in 4 weeks   Subjective: [name] is an 76 y.o. year old male who is a primary patient of [name], [name], MD.  The CCM team was consulted for assistance with disease management and care coordination needs.    Engaged with patient by telephone for follow up visit in response to provider referral for pharmacy case management and/or care coordination services.   Consent to Services:  The patient was given information about Chronic Care Management services, agreed to services, and gave verbal consent prior to initiation of services.  Please see initial visit note for detailed documentation.   Patient Care Team: [name], [name], MD as PCP - General (Family Medicine) Croitoru, Dani Gobble, MD as PCP - Cardiology (Cardiology) Sanda Klein, MD as Attending Physician (Cardiology) Sanda Klein, MD as Consulting Physician (Cardiology) Ralene Bathe, MD as Consulting Physician (Ophthalmology) Viona Gilmore, [facility] as Pharmacist (Pharmacist)  Recent office visits: 03/11/22 Rolene Arbour, LPN: Patient presented for AWV.  12/28/21 [name], MD: Patient presented for annual exam. Prescribed trazodone 1/2 tablet daily at bedtime. Follow up in 3 months for anxiety and insomnia.  Recent consult visits: 01/07/22 Croitoru, Dani Gobble, MD Cardiology: Patient presented for CAD follow up. No  medication changes. Follow up in 12 months.  11/30/21 [name] Endocrinology: Patient presented for DM follow up.   09/10/21 [name] (ENT) - Patient was seen for Mixed hearing loss, Bilateral chronic serous otitis media. No other chart notes.   Hospital visits: None in previous 6 months  Objective:  Lab Results  Component Value Date   CREATININE 1.14 12/31/2021   BUN 14 12/31/2021   GFR 64.41 07/03/2018   GFRNONAA 60 04/11/2020   GFRAA 69 04/11/2020   NA 139 12/31/2021   K 4.0 12/31/2021   CALCIUM 9.3 12/31/2021   CO2 26 12/31/2021   GLUCOSE 145 (H) 12/31/2021    Lab Results  Component Value Date/Time   HGBA1C 8.6 (H) 12/25/2018 08:37 AM   HGBA1C 7.5 (A) 07/03/2018 03:58 PM   HGBA1C 6.6 (A) 01/10/2018 02:59 PM   HGBA1C 6.3 06/10/2017 12:39 PM   HGBA1C 6.6 06/15/2016 12:00 AM   FRUCTOSAMINE 236 12/07/2016 11:37 AM   GFR 64.41 07/03/2018 04:37 PM   GFR 53.81 (L) 03/21/2018 12:02 PM   MICROALBUR 2.2 (H) 01/10/2018 03:09 PM   MICROALBUR 1.6 08/16/2016 03:20 PM    Last diabetic Eye exam:  Lab Results  Component Value Date/Time   HMDIABEYEEXA No Retinopathy 01/15/2022 12:00 AM    Last diabetic Foot exam: No results found for: "HMDIABFOOTEX"   Lab Results  Component Value Date   CHOL 123 12/31/2021   HDL 31 (L) 12/31/2021   LDLCALC 59 12/31/2021   LDLDIRECT 74.0 12/28/2016   TRIG 201 (H) 12/31/2021   CHOLHDL 4.0 12/31/2021       Latest Ref Rng & Units 12/31/2021    8:14 AM 04/11/2020   10:49 AM 12/25/2018  8:37 AM  Hepatic Function  Total Protein 6.0 - 8.5 g/dL 6.8  6.9  7.0   Albumin 3.7 - 4.7 g/dL 4.6   4.5   AST 0 - 40 IU/L 33  32  48   ALT 0 - 44 IU/L 12  13  19    Alk Phosphatase 44 - 121 IU/L 87   97   Total Bilirubin 0.0 - 1.2 mg/dL 0.7  0.8  0.8     Lab Results  Component Value Date/Time   TSH 3.12 08/08/2018 04:40 PM   TSH 4.510 (H) 12/20/2017 09:37 AM   FREET4 1.35 12/20/2017 09:37 AM       Latest Ref Rng & Units 03/21/2018    12:02 PM 11/28/2017   11:50 AM 09/27/2017   11:35 AM  CBC  WBC 4.0 - 10.5 K/uL 8.8  7.1  8.5   Hemoglobin 13.0 - 17.0 g/dL 15.3  14.4  14.6   Hematocrit 39.0 - 52.0 % 44.4  42.3  40.9   Platelets 150.0 - 400.0 K/uL 267.0  161  189     No results found for: "VD25OH"  Clinical ASCVD: Yes  The ASCVD Risk score (Arnett DK, et al., 2019) failed to calculate for the following reasons:   The systolic blood pressure is missing   The valid total cholesterol range is 130 to 320 mg/dL       03/11/2022    2:16 PM 12/28/2021    3:02 PM 02/04/2021    9:33 AM  Depression screen PHQ 2/9  Decreased Interest 0 0 0  Down, Depressed, Hopeless 0 0 0  PHQ - 2 Score 0 0 0      Social History   Tobacco Use  Smoking Status Former   Types: Cigars   Quit date: 09/09/2017   Years since quitting: 4.5  Smokeless Tobacco Never  Tobacco Comments   states he smoked cigar 1-2 per year.   01/07/2022 patient stated he smoked 1 cigarette before coming to appointment   BP Readings from Last 3 Encounters:  01/07/22 140/72  12/28/21 120/60  07/09/21 130/64   Pulse Readings from Last 3 Encounters:  01/07/22 (!) 56  12/28/21 (!) 56  07/09/21 68   Wt Readings from Last 3 Encounters:  03/11/22 153 lb (69.4 kg)  01/07/22 160 lb (72.6 kg)  12/28/21 160 lb 4 oz (72.7 kg)   BMI Readings from Last 3 Encounters:  03/11/22 22.59 kg/m  01/07/22 23.63 kg/m  12/28/21 23.66 kg/m    Assessment/Interventions: Review of patient past medical history, allergies, medications, health status, including review of consultants reports, laboratory and other test data, was performed as part of comprehensive evaluation and provision of chronic care management services.   SDOH:  (Social Determinants of Health) assessments and interventions performed: Yes SDOH Interventions    Flowsheet Row Clinical Support from 03/11/2022 in [place] at [place] Management from 11/13/2020 in [place] at  [place] from 10/13/2020 in [place] at [place] from 08/08/2018 in [place] at [place] from 01/18/2018 in [place] at [place] Interventions Intervention Not Indicated -- Intervention Not Indicated -- --  Housing Interventions Intervention Not Indicated -- Intervention Not Indicated -- --  Transportation Interventions Intervention Not Indicated Intervention Not Indicated Intervention Not Indicated -- --  Depression Interventions/Treatment  -- -- -- Medication Currently on Treatment  [declines counseling ]  Financial Strain Interventions Intervention  Not Indicated Other (Comment)  [working on patient assistance] Intervention Not Indicated -- --  Physical Activity Interventions Intervention Not Indicated -- Intervention Not Indicated -- --  Stress Interventions Intervention Not Indicated -- Intervention Not Indicated -- --  Social Connections Interventions Intervention Not Indicated -- Intervention Not Indicated -- --       SDOH Screenings   Food Insecurity: No Food Insecurity (03/11/2022)  Housing: Low Risk  (03/11/2022)  Transportation Needs: No Transportation Needs (03/11/2022)  Alcohol Screen: Low Risk  (03/11/2022)  Depression (PHQ2-9): Low Risk  (03/11/2022)  Financial Resource Strain: Low Risk  (03/11/2022)  Physical Activity: Sufficiently Active (03/11/2022)  Social Connections: Socially Integrated (03/11/2022)  Stress: No Stress Concern Present (03/11/2022)  Tobacco Use: Medium Risk (03/11/2022)     CCM Care Plan  Allergies  Allergen Reactions   Codeine Nausea Only and Other (See Comments)    Cannot take on empty stomach.   Valium Nausea Only and Other (See Comments)    Cannot take on empty stomach.   Empagliflozin Other (See Comments)   Simvastatin Other (See Comments)   Sitagliptin Other (See Comments)    Medications Reviewed Today     Reviewed by  Viona Gilmore, [facility] (Pharmacist) on 03/16/22 at 1557  Med List Status: <None>   Medication Order Taking? Sig Documenting Provider Last Dose Status Informant  amLODipine (NORVASC) 10 MG tablet [id number]  TAKE ONE-HALF TABLET BY  MOUTH DAILY Croitoru, Mihai, MD  Active   aspirin 81 MG tablet [id number]  Take 81 mg by mouth daily. [provider]  Active Spouse/Significant Other           Med Note Unk Lightning Sep 08, 2017 12:11 PM) Took 4 - 81 mg tablets today   insulin glargine (LANTUS) 100 UNIT/ML injection [id number]  Inject 20 Units into the skin daily. [provider]  Active   losartan (COZAAR) 50 MG tablet [id number]  Take 1 tablet (50 mg total) by mouth 2 (two) times daily. Croitoru, Dani Gobble, MD  Active            Med Note Marjorie Smolder Jan 07, 2022  2:22 PM) Patient takes 1 tablet daily  ONETOUCH ULTRA test strip [id number]  CHECK BLOOD SUGAR 3 TIMES A DAY OR AS DIRECTED [name], [name], MD  Active   pantoprazole (PROTONIX) 40 MG tablet [id number]  TAKE 1 TABLET BY MOUTH DAILY [name], [name], MD  Active            Med Note Marjorie Smolder Jan 07, 2022  2:23 PM) Patient takes 3 times weekly  rosuvastatin (CRESTOR) 10 MG tablet [id number]  Take 10 daily 6 times a week Croitoru, Mihai, MD  Active   traZODone (DESYREL) 50 MG tablet [id number]  Take 0.5 tablets (25 mg total) by mouth at bedtime.  Patient not taking: Reported on 01/07/2022   [name], [name], MD  Active            Med Note Netta Neat, Radene Ou Jan 07, 2022  2:20 PM) Patient would like to talk with provider before taking  Eek X 6 MM Raoul [id number]  USE AS DIRECTED [name], [name], MD  Active             Patient Active Problem List   Diagnosis Date Noted   Coronary artery disease involving native coronary artery of native heart with angina pectoris ([place])  12/28/2021   Thyroid nodule 09/08/2018   Tremor of both hands 07/03/2018   Insomnia 07/03/2018    Anxiety disorder 03/21/2018   Nausea without vomiting 01/10/2018   Bradycardia- with decrease of BB some improvement 09/10/2017   S/P angioplasty with stent DES mid SVG to OM and DES to prox. SVG to OM, DAPT for lifetime 09/10/2017   Unstable angina pectoris ([place]) 09/08/2017   Chest pain with moderate risk for cardiac etiology 05/20/2017   Carotid artery disease ([place]) 05/20/2017   Allergic rhinitis 08/16/2016   Chronic combined systolic and diastolic heart failure ([place]) 05/06/2015   Groin pain 08/13/2014   Prolonged Q-T interval on ECG 04/30/2014   Ischemic cardiomyopathy 04/06/2014   Unstable angina (HCC) 04/03/2014   Syncope 02/02/2014   Acute gastroenteritis 02/02/2014   Hypertension    GERD (gastroesophageal reflux disease)    Atrial fibrillation ([place])    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 08/16/2013   Type 2 diabetes mellitus with diabetic neuropathy, unspecified ([place]) 08/16/2013   Hyperlipidemia, mixed 08/16/2013   Aftercare following surgery of the circulatory system, NEC 07/31/2013   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[place]) 07/25/2012    Immunization History  Administered Date(s) Administered   Fluad Quad(high Dose 65+) 04/11/2019, 04/11/2020, 04/28/2021   Influenza, High Dose Seasonal PF 04/30/2014, 04/04/2015, 03/19/2016, 04/15/2017, 04/11/2018   Moderna Sars-Covid-2 Vaccination 08/23/2019, 09/21/2019, 05/20/2020, 01/20/2021   Pneumococcal Conjugate-13 02/24/2015   Pneumococcal Polysaccharide-23 04/11/2020   Tdap 07/12/2012   Zoster, Live 08/07/2013   Patient reported he was approved for Lantus PAP but did not receive supply from the company for a few weeks and was unsure why. He was sampled through his endocrinologist's office on [place] and then [facility] and he didn't feel like they worked as well. Patient is now following up with Dr. Clair Gulling.   Patient doesn't feel like he notices a difference with the nights he is  taking the trazodone. He is not sure if there is a difference. He is having trouble with staying asleep. Patient just gets up once a night during the night. Recommended trial of timed release melatonin 2 or 3 mg and limiting screen time before bed as he does watch TV. Patient goes to bed around the same time every night and does admit to worrying about things before going to bed. Patient will keep a notebook to write down his thoughts before bed.  Conditions to be addressed/monitored:  Hypertension, Hyperlipidemia, Diabetes, Atrial Fibrillation, Coronary Artery Disease and GERD  Conditions addressed this visit: Hypertension, diabetes  Care Plan : CCM Pharmacy Care Plan  Updates made by Viona Gilmore, [place] since 03/18/2022 12:00 AM     Problem: Problem: Hypertension, Hyperlipidemia, Diabetes, Atrial Fibrillation, Coronary Artery Disease and GERD      Long-Range Goal: Patient-Specific Goal   Start Date: 11/13/2020  Expected End Date: 11/13/2021  Recent Progress: On track  Priority: High  Note:   Current Barriers:  Unable to independently afford treatment regimen Unable to independently monitor therapeutic efficacy  Pharmacist Clinical Goal(s):  Patient will verbalize ability to afford treatment regimen achieve adherence to monitoring guidelines and medication adherence to achieve therapeutic efficacy through collaboration with PharmD and provider.   Interventions: 1:1 collaboration with [name], [name], MD regarding development and update of comprehensive plan of care as evidenced by provider attestation and co-signature Inter-disciplinary care team collaboration (see longitudinal plan of care) Comprehensive medication review performed; medication list updated in electronic medical record  Hypertension (BP  goal <140/90) -Controlled -Current treatment: Amlodipine 5 mg 1 tablet daily - in AM - Appropriate, Query effective, Safe, Accessible Losartan 50 mg 1 tablet twice daily - not  taking  -Medications previously tried: n/a  -Current home readings: does not check regularly -Current dietary habits: doesn't eat much salt at all; wife doesn't cook with it much -Current exercise habits: tries to stay active with gardening but no formal exercise -Denies hypotensive/hypertensive symptoms -Educated on Daily salt intake goal < 2300 mg; Importance of home blood pressure monitoring; Proper BP monitoring technique; -Counseled to monitor BP at home weekly, document, and provide log at future appointments -Counseled on diet and exercise extensively Recommended restarting losartan as prescribed.  Hyperlipidemia: (LDL goal < 70) -Controlled -Current treatment: Rosuvastatin 10 mg 1 tablet daily 6 days a week - Appropriate, Effective, Safe, Accessible -Medications previously tried: simvastatin -Current dietary patterns: fries food often; recently switched to an air fryer -Current exercise habits: stays active but no formal exercise -Educated on Cholesterol goals;  Benefits of statin for ASCVD risk reduction; Exercise goal of 150 minutes per week; -Counseled on diet and exercise extensively Recommended to continue current medication  CAD (Goal: prevent heart attacks and strokes) -Controlled -Current treatment  Aspirin 81 mg 1 tablet daily - Appropriate, Effective, Safe, Accessible Rosuvastatin 10 mg 1 tablet daily 6 times a week - Appropriate, Effective, Safe, Accessible Nitroglycerin 0.4 mg 1 tablet as needed - Appropriate, Effective, Safe, Accessible -Medications previously tried: none  -Recommended to continue current medication Counseled on monitoring for signs of bleeding such as unexplained and excessive bleeding from a cut or injury, easy or excessive bruising, blood in urine or stools, and nosebleeds without a known cause   Diabetes (A1c goal <7%) -Uncontrolled -Current medications: Lantus 100 mg/unit inject 16 units daily - Appropriate, Query effective, Safe,  Accessible  -Medications previously tried: Jardiance (diaphoresis and anxiety), sitagliptin -Current home glucose readings fasting glucose:  125, 225, 123, 133, 207 post prandial glucose: n/a -Denies hypoglycemic/hyperglycemic symptoms -Current meal patterns:  breakfast: poptart  lunch: sandwich  dinner: hot dogs, chicken, beef and sometimes a vegetable snacks: did not discuss drinks: Dr. Malachi Bonds, water, and juice -Current exercise: no structured exercise -Educated on A1c and blood sugar goals; Exercise goal of 150 minutes per week; Benefits of routine self-monitoring of blood sugar; Continuous glucose monitoring; Carbohydrate counting and/or plate method -Counseled to check feet daily and get yearly eye exams -Counseled on diet and exercise extensively Recommended to continue current medication Recommended discussing the use of a CGM with his endocrinologist.  Anxiety/insomnia (Goal: minimize symptoms and improve sleep) -Controlled -Current treatment: Trazodone 50 mg 1 tablet at bedtime as needed - Appropriate, Query effective, Safe, Accessible -Medications previously tried/failed: sertraline, citalopram -PHQ9: n/a -GAD7: n/a -Recommended trial of timed release melatonin 2 or 3 mg in addition to trazodone.  GERD (Goal: minimize symptoms of heartburn) -Controlled -Current treatment  Pantoprazole 40 mg 1 tablet three times a week - Appropriate, Effective, Safe, Accessible -Medications previously tried: none  -Counseled on decreasing to twice weekly to see if he tolerates  Allergic rhinitis (Goal: minimize symptoms) -Controlled -Current treatment  Ipratropium 0.06% 2 sprays into both nostrils 4 times daily as needed - Appropriate, Effective, Safe, Accessible Loratadine 10 mg 1 tablet as needed - Appropriate, Effective, Safe, Accessible -Medications previously tried: none  -Recommended to continue current medication  Health Maintenance -Vaccine gaps: shingrix, influenza  vaccine -Current therapy:  No medications -Educated on Cost vs benefit of each product must be carefully  weighed by individual consumer -Patient is satisfied with current therapy and denies issues -Recommended to continue as is   Patient Goals/Self-Care Activities Patient will:  - take medications as prescribed check glucose daily, document, and provide at future appointments target a minimum of 150 minutes of moderate intensity exercise weekly engage in dietary modifications by limiting carb intake  Follow Up Plan: Telephone follow up appointment with care management team member scheduled for: 6 months       Medication Assistance:  Lantus obtained through [place] medication assistance program.  Enrollment ends 07/11/22  Compliance/Adherence/Medication fill history: Care Gaps: Shingrix, COVID booster, foot exam, influenza, A1c Last BP 140/72 on 01/07/2022 Last A1C - 8.6 on 12/25/2018   Star-Rating Drugs: Losartan  66m - last [facility]/2021 [facility] Rosuvastatin 158m- last filled 11/18/2021 86 DS at [facility]  Patient's preferred pharmacy is:  [place]one: [phone]: [phone] (OpSalem HeightsCAStonewalloThe [facility] Center[address]hone: [phone]: [phone] [facility]livery ([facility]) - [place] [id number]: [phone]: [phone] Uses pill box? Yes Pt endorses 100% compliance - only misses it when he goes into appointments  We discussed: Current pharmacy is preferred with insurance plan and patient is satisfied with pharmacy services Patient decided to: Continue current medication management strategy  Care Plan and Follow Up Patient  Decision:  Patient agrees to Care Plan and Follow-up.  Plan: Telephone follow up appointment with care management team member scheduled for:  6 months  MaJeni SallesPharmD BCPanoraharmacist LeDonegalt BrBrooklyn [phone]

## 2022-03-16 ENCOUNTER — Telehealth: Payer: Medicare Other

## 2022-03-16 ENCOUNTER — Ambulatory Visit (INDEPENDENT_AMBULATORY_CARE_PROVIDER_SITE_OTHER): Payer: Medicare Other | Admitting: Pharmacist

## 2022-03-16 DIAGNOSIS — I10 Essential (primary) hypertension: Secondary | ICD-10-CM

## 2022-03-16 DIAGNOSIS — G4700 Insomnia, unspecified: Secondary | ICD-10-CM

## 2022-03-24 ENCOUNTER — Telehealth: Payer: Self-pay | Admitting: Pharmacist

## 2022-03-24 NOTE — Telephone Encounter (Signed)
-----   Message from Sanda Klein, MD sent at 03/18/2022 11:05 AM EDT ----- Regarding: RE: Losartan question He was prescribed it for renal protection, rather than HBP. I do not recall stopping it, but also do not feel strongly that he needs it. If not currently on it , OK to DC it please. ----- Message ----- From: Viona Gilmore, [facility] Sent: 03/18/2022   9:45 AM EDT To: Sanda Klein, MD Subject: Losartan question                              Hi,  I spoke with [name] about his medications and it appears he is not taking the losartan again. He is not quite sure what happened with it and I know this has happened before that there was a period in which he was off it and he was thinking it was discontinued by you. Does this sound familiar to you? It looks like according to your note, he is still supposed to be on it but just wanted to double check with you first before calling him back and telling him otherwise.  Please let me know!  Thanks, Maddie  Jeni Salles, PharmD, [place] Pharmacist [place] [facility] [place]

## 2022-03-24 NOTE — Telephone Encounter (Signed)
Called patient about the recommendation from cardiologist. Patient is aware and removing losartan from medication list.

## 2022-03-27 ENCOUNTER — Other Ambulatory Visit: Payer: Self-pay | Admitting: Cardiovascular Disease

## 2022-04-10 DIAGNOSIS — I10 Essential (primary) hypertension: Secondary | ICD-10-CM

## 2022-04-26 ENCOUNTER — Ambulatory Visit: Payer: Medicare Other | Admitting: Family Medicine

## 2022-04-27 ENCOUNTER — Ambulatory Visit (INDEPENDENT_AMBULATORY_CARE_PROVIDER_SITE_OTHER): Payer: Medicare Other

## 2022-04-27 DIAGNOSIS — Z23 Encounter for immunization: Secondary | ICD-10-CM

## 2022-04-29 DIAGNOSIS — N182 Chronic kidney disease, stage 2 (mild): Secondary | ICD-10-CM | POA: Diagnosis not present

## 2022-04-29 DIAGNOSIS — E785 Hyperlipidemia, unspecified: Secondary | ICD-10-CM | POA: Diagnosis not present

## 2022-04-29 DIAGNOSIS — E1122 Type 2 diabetes mellitus with diabetic chronic kidney disease: Secondary | ICD-10-CM | POA: Diagnosis not present

## 2022-04-29 DIAGNOSIS — I10 Essential (primary) hypertension: Secondary | ICD-10-CM | POA: Diagnosis not present

## 2022-05-19 ENCOUNTER — Telehealth: Payer: Self-pay | Admitting: Pharmacist

## 2022-05-19 NOTE — Chronic Care Management (AMB) (Signed)
Chronic Care Management Pharmacy Assistant   Name: [name]  MRN: [id number] DOB: 09/12/1945  Reason for Encounter: Disease State / Hypertension Assessment Call   Conditions to be addressed/monitored: HTN  Recent office visits:  None  Recent consult visits:  None  Hospital visits:  None  Medications: Outpatient Encounter Medications as of 05/19/2022  Medication Sig Note   amLODipine (NORVASC) 10 MG tablet TAKE ONE-HALF TABLET BY MOUTH  DAILY    aspirin 81 MG tablet Take 81 mg by mouth daily.    insulin glargine (LANTUS) 100 UNIT/ML injection Inject 20 Units into the skin daily.    ONETOUCH ULTRA test strip CHECK BLOOD SUGAR 3 TIMES A DAY OR AS DIRECTED    pantoprazole (PROTONIX) 40 MG tablet TAKE 1 TABLET BY MOUTH DAILY 01/07/2022: Patient takes 3 times weekly   rosuvastatin (CRESTOR) 10 MG tablet Take 10 daily 6 times a week    traZODone (DESYREL) 50 MG tablet Take 0.5 tablets (25 mg total) by mouth at bedtime. (Patient not taking: Reported on 01/07/2022) 01/07/2022: Patient would like to talk with provider before taking   [place] X 6 MM MISC USE AS DIRECTED    No facility-administered encounter medications on file as of 05/19/2022.  Fill History:   Dispensed Days Supply Quantity Provider Pharmacy  trazodone 50 mg tablet 12/28/2021 60       Dispensed Days Supply Quantity Provider Pharmacy  ROSUVASTATIN CALCIUM  10 MG TABS 02/21/2022 100       Dispensed Days Supply Quantity Provider Pharmacy  PANTOPRAZOLE SODIUM  40 MG TBEC 04/26/2022 100       Dispensed Days Supply Quantity Provider Pharmacy  LANTUS SOLOSTAR  100 UNIT/ML SOPN 11/17/2021 30       Dispensed Days Supply Quantity Provider Pharmacy  AMLODIPINE BESYLATE  10 MG TABS 04/26/2022 100      Reviewed chart prior to disease state call. Spoke with patient regarding BP  Recent Office Vitals: BP Readings from Last 3 Encounters:  01/07/22 140/72  12/28/21 120/60  07/09/21 130/64    Pulse Readings from Last 3 Encounters:  01/07/22 (!) 56  12/28/21 (!) 56  07/09/21 68    Wt Readings from Last 3 Encounters:  03/11/22 153 lb (69.4 kg)  01/07/22 160 lb (72.6 kg)  12/28/21 160 lb 4 oz (72.7 kg)     Kidney Function Lab Results  Component Value Date/Time   CREATININE 1.14 12/31/2021 08:14 AM   CREATININE 1.23 10/16/2020 08:37 AM   CREATININE 1.19 (H) 04/11/2020 10:49 AM   CREATININE 1.03 04/03/2014 04:48 PM   GFR 64.41 07/03/2018 04:37 PM   GFRNONAA 60 04/11/2020 10:49 AM   GFRAA 69 04/11/2020 10:49 AM       Latest Ref Rng & Units 12/31/2021    8:14 AM 10/16/2020    8:37 AM 04/11/2020   10:49 AM  BMP  Glucose 70 - 99 mg/dL 145  129  184   BUN 8 - 27 mg/dL '14  14  16   '$ Creatinine 0.76 - 1.27 mg/dL 1.14  1.23  1.19   BUN/Creat Ratio 10 - '24 12  11  13   '$ Sodium 134 - 144 mmol/L 139  140  139   Potassium 3.5 - 5.2 mmol/L 4.0  4.1  4.3   Chloride 96 - 106 mmol/L 102  102  102   CO2 20 - 29 mmol/L '26  24  25   '$ Calcium 8.6 - 10.2 mg/dL 9.3  9.4  9.6    Current antihypertensive regimen:  Amlodipine 10 mg 1/2 tablet daily  How often are you checking your Blood Pressure?   Current home BP readings:   What recent interventions/DTPs have been made by any provider to improve Blood Pressure control since last CPP Visit: No recent interventions   Any recent hospitalizations or ED visits since last visit with CPP? No recent hospital visits.   What diet changes have been made to improve Blood Pressure Control?  Patient follows Breakfast - patient Lunch - patient Dinner - patient  What exercise is being done to improve your Blood Pressure Control?    Adherence Review: Is the patient currently on ACE/ARB medication? No Does the patient have >5 day gap between last estimated fill dates? No  Unable to reach patient after several attempts.   Care Gaps: AWV - scheduled 03/16/2023 Last BP - 140/72 on 01/07/2022 Last A1C - 8.6 on 12/25/2018 Urine ACR -  overdue Covid - overdue HGA1C - overdue Shingrix - postponed  Star Rating Drugs: Rosuvastatin '10mg'$  - last filled 02/21/2022 100 DS at [facility] Pharmacist Assistant [phone]

## 2022-06-30 ENCOUNTER — Other Ambulatory Visit: Payer: Self-pay | Admitting: Family Medicine

## 2022-08-19 NOTE — Progress Notes (Signed)
Care Management & Coordination Services Pharmacy Note  08/19/2022 Name:  [name] MRN:  [id number] DOB:  19-Sep-1945  Summary: -A1C at goal of 7.0 per patient report at last endo visit 2 months ago -Reports BP WNL at home but no log and checking infrequently, recommended regular check and keep a log -Will request records from [facility] for T2DM  Recommendations/Changes made from today's visit: -Continue medication therapy -Check BP every 1 to 2 days and keep log to monitor bp control in order to consider future addition of low-dose ACEI/ARB for kidney protection  Follow up plan: HTN review call in 2 weeks Pharmacist visit in 6 months   Subjective: [name] is an 77 y.o. year old male who is a primary patient of [name], [name], MD.  The care coordination team was consulted for assistance with disease management and care coordination needs.    Engaged with patient by telephone for follow up visit.  Recent office visits: None  Recent consult visits: None  Hospital visits: None in previous 6 months   Objective:  Lab Results  Component Value Date   CREATININE 1.14 12/31/2021   BUN 14 12/31/2021   GFR 64.41 07/03/2018   EGFR 67 12/31/2021   GFRNONAA 60 04/11/2020   GFRAA 69 04/11/2020   NA 139 12/31/2021   K 4.0 12/31/2021   CALCIUM 9.3 12/31/2021   CO2 26 12/31/2021   GLUCOSE 145 (H) 12/31/2021    Lab Results  Component Value Date/Time   HGBA1C 8.6 (H) 12/25/2018 08:37 AM   HGBA1C 7.5 (A) 07/03/2018 03:58 PM   HGBA1C 6.6 (A) 01/10/2018 02:59 PM   HGBA1C 6.3 06/10/2017 12:39 PM   HGBA1C 6.6 06/15/2016 12:00 AM   FRUCTOSAMINE 236 12/07/2016 11:37 AM   GFR 64.41 07/03/2018 04:37 PM   GFR 53.81 (L) 03/21/2018 12:02 PM   MICROALBUR 2.2 (H) 01/10/2018 03:09 PM   MICROALBUR 1.6 08/16/2016 03:20 PM    Last diabetic Eye exam:  Lab Results  Component Value Date/Time   HMDIABEYEEXA No Retinopathy 01/15/2022 12:00 AM    Last diabetic Foot exam: No  results found for: "HMDIABFOOTEX"   Lab Results  Component Value Date   CHOL 123 12/31/2021   HDL 31 (L) 12/31/2021   LDLCALC 59 12/31/2021   LDLDIRECT 74.0 12/28/2016   TRIG 201 (H) 12/31/2021   CHOLHDL 4.0 12/31/2021       Latest Ref Rng & Units 12/31/2021    8:14 AM 04/11/2020   10:49 AM 12/25/2018    8:37 AM  Hepatic Function  Total Protein 6.0 - 8.5 g/dL 6.8  6.9  7.0   Albumin 3.7 - 4.7 g/dL 4.6   4.5   AST 0 - 40 IU/L 33  32  48   ALT 0 - 44 IU/L 12  13  19   $ Alk Phosphatase 44 - 121 IU/L 87   97   Total Bilirubin 0.0 - 1.2 mg/dL 0.7  0.8  0.8     Lab Results  Component Value Date/Time   TSH 3.12 08/08/2018 04:40 PM   TSH 4.510 (H) 12/20/2017 09:37 AM   FREET4 1.35 12/20/2017 09:37 AM       Latest Ref Rng & Units 03/21/2018   12:02 PM 11/28/2017   11:50 AM 09/27/2017   11:35 AM  CBC  WBC 4.0 - 10.5 K/uL 8.8  7.1  8.5   Hemoglobin 13.0 - 17.0 g/dL 15.3  14.4  14.6   Hematocrit 39.0 - 52.0 %  44.4  42.3  40.9   Platelets 150.0 - 400.0 K/uL 267.0  161  189     Lab Results  Component Value Date/Time   VITAMINB12 292 11/28/2017 11:50 AM    Clinical ASCVD: Yes  The ASCVD Risk score (Arnett DK, et al., 2019) failed to calculate for the following reasons:   The systolic blood pressure is missing   The valid total cholesterol range is 130 to 320 mg/dL    CHA2DS2VASc: 6     03/11/2022    2:16 PM 12/28/2021    3:02 PM 02/04/2021    9:33 AM  Depression screen PHQ 2/9  Decreased Interest 0 0 0  Down, Depressed, Hopeless 0 0 0  PHQ - 2 Score 0 0 0     Social History   Tobacco Use  Smoking Status Former   Types: Cigars   Quit date: 09/09/2017   Years since quitting: 4.9  Smokeless Tobacco Never  Tobacco Comments   states he smoked cigar 1-2 per year.   01/07/2022 patient stated he smoked 1 cigarette before coming to appointment   BP Readings from Last 3 Encounters:  01/07/22 140/72  12/28/21 120/60  07/09/21 130/64   Pulse Readings from Last 3  Encounters:  01/07/22 (!) 56  12/28/21 (!) 56  07/09/21 68   Wt Readings from Last 3 Encounters:  03/11/22 153 lb (69.4 kg)  01/07/22 160 lb (72.6 kg)  12/28/21 160 lb 4 oz (72.7 kg)   BMI Readings from Last 3 Encounters:  03/11/22 22.59 kg/m  01/07/22 23.63 kg/m  12/28/21 23.66 kg/m    Allergies  Allergen Reactions   Codeine Nausea Only and Other (See Comments)    Cannot take on empty stomach.   Valium Nausea Only and Other (See Comments)    Cannot take on empty stomach.   Empagliflozin Other (See Comments)   Simvastatin Other (See Comments)   Sitagliptin Other (See Comments)    Medications Reviewed Today     Reviewed by Viona Gilmore, [facility] (Pharmacist) on 03/24/22 at 1500  Med List Status: <None>   Medication Order Taking? Sig Documenting Provider Last Dose Status Informant  amLODipine (NORVASC) 10 MG tablet [id number] No TAKE ONE-HALF TABLET BY  MOUTH DAILY Croitoru, Mihai, MD Taking Active   aspirin 81 MG tablet [id number] No Take 81 mg by mouth daily. [provider] Taking Active Spouse/Significant Other           Med Note Kipp Brood, [place] Mar 16, 2022  4:10 PM)    insulin glargine (LANTUS) 100 UNIT/ML injection [id number] No Inject 20 Units into the skin daily. [provider] Taking Active   Patient not taking:  Discontinued 03/24/22 1500 (Discontinued by provider)            Med Note Kipp Brood, MADELINE G   Tue Mar 16, 2022  4:10 PM)    [facility] ULTRA test strip [id number] No CHECK BLOOD SUGAR 3 TIMES A DAY OR AS DIRECTED [name], [name], MD Taking Active   pantoprazole (PROTONIX) 40 MG tablet [id number] No TAKE 1 TABLET BY MOUTH DAILY [name], [name], MD Taking Active            Med Note Marjorie Smolder Jan 07, 2022  2:23 PM) Patient takes 3 times weekly  rosuvastatin (CRESTOR) 10 MG tablet [id number] No Take 10 daily 6 times a week Croitoru, Mihai, MD Taking Active   traZODone (DESYREL) 50 MG tablet [id number] No  Take 0.5 tablets (25  mg total) by mouth at bedtime.  Patient not taking: Reported on 01/07/2022   [name], [name], MD Not Taking Active            Med Note Marjorie Smolder Jan 07, 2022  2:20 PM) Patient would like to talk with provider before taking  [place] X 6 MM [place] [id number] No USE AS DIRECTED [name], [name], MD Taking Active             SDOH:  (Social Determinants of Health) assessments and interventions performed: Yes SDOH Interventions    Flowsheet Row Clinical Support from 03/11/2022 in [place] at [place] Management from 11/13/2020 in [place] at [place] from 10/13/2020 in [place] at [place] from 08/08/2018 in [place] at [place] from 01/18/2018 in [place] at [place] Interventions Intervention Not Indicated -- Intervention Not Indicated -- --  Housing Interventions Intervention Not Indicated -- Intervention Not Indicated -- --  Transportation Interventions Intervention Not Indicated Intervention Not Indicated Intervention Not Indicated -- --  Depression Interventions/Treatment  -- -- -- Medication Currently on Treatment  [declines counseling ]  Financial Strain Interventions Intervention Not Indicated Other (Comment)  [working on patient assistance] Intervention Not Indicated -- --  Physical Activity Interventions Intervention Not Indicated -- Intervention Not Indicated -- --  Stress Interventions Intervention Not Indicated -- Intervention Not Indicated -- --  Social Connections Interventions Intervention Not Indicated -- Intervention Not Indicated -- --       Medication Assistance: None required.  Patient affirms current coverage meets needs.  Medication Access: Within the past 30 days, how often has patient missed a dose of medication? None Is  a pillbox or other method used to improve adherence? Yes  Factors that may affect medication adherence? no barriers identified Are meds synced by current pharmacy? No  Are meds delivered by current pharmacy? No  Does patient experience delays in picking up medications due to transportation concerns? No   Upstream Services Reviewed: Is patient disadvantaged to use [facility] Pharmacy?: Yes  Current Rx insurance plan: [facility] Name and location of Current pharmacy:  [place], [place] [address] Phone: [phone] Fax: [phone]  [facility] ([place], [place] [facility] [address] [place] [address] [place] [address] Phone: [phone] Fax: [place], [place] [address] Phone: [phone] Fax: [phone]  [facility] Pharm[facility] services reviewed with patient today?: No  Patient requests to transfer care to Upstream Pharmacy?: No  Reason patient declined to change pharmacies: Disadvantaged due to insurance/mail order  Compliance/Adherence/Medication fill history: Care Gaps: Urine microalbumin A1C due Vaccines: Tdap/Shingles/Covid  Star-Rating Drugs: Rosuvastatin 54m PDC 100%   Assessment/Plan   Hypertension (BP goal <130/80) -Not ideally controlled -Current treatment: Amlodipine 1/2 tab qd Appropriate, Query Effective -Medications previously tried: Carvedilol, Losartan, Metoprolol, Propranolol -Current home readings: no specific log but reports 120s/60s -Current dietary habits: limits salt -Current exercise habits: yardwork outside daily -Denies hypotensive/hypertensive symptoms -Educated on BP goals and benefits of medications for prevention of heart attack, stroke and kidney damage; Daily salt intake  goal < 2300 mg; Exercise goal of 150 minutes per  week; Importance of home blood pressure monitoring; -Counseled to monitor BP at home every 1-2 days, document, and provide log at future appointments -Counseled on diet and exercise extensively Recommended to continue current medication Recommended keeping log of BP checks for next 2 weeks, will f/u for readings. Would like to restart low dose losartan or ACEI for kidney protection with diabetes  Diabetes (A1c goal <7%) -Controlled -Current medications: Lantus 20 units into skin Appropriate, Effective, Safe, Accessible -Medications previously tried:  Jardiance -Current home glucose readings fasting glucose: 147 today, 137 yesterday, 102 two days ago -Denies hypoglycemic/hyperglycemic symptoms -Sees Eilene Ghazi at [facility] for Mgmt, reports A1C 7.0 two months ago -Current meal patterns:  drinks: juice for breakfast, water during the day, dr pepper -Current exercise: see above -Educated on A1c and blood sugar goals; Complications of diabetes including kidney damage, retinal damage, and cardiovascular disease; Exercise goal of 150 minutes per week; Benefits of routine self-monitoring of blood sugar; -Counseled to check feet daily and get yearly eye exams -Recommended to continue current medication  [place] Pharmacist [phone]

## 2022-08-23 ENCOUNTER — Telehealth: Payer: Self-pay

## 2022-08-23 NOTE — Progress Notes (Signed)
Care Management & Coordination Services Pharmacy Team  Reason for Encounter: Appointment Reminder  Contacted patient to confirm telephone appointment with Burman Riis, PharmD on 08/24/2022 at 3:15.  Spoke with family on 08/23/2022. Spoke with patients wife, requests to call home phone for appointments.   Do you have any problems getting your medications? No  What is your top health concern you would like to discuss at your upcoming visit? Nothing at this time.  Have you seen any other providers since your last visit with PCP? Eilene Ghazi NP for DM, noted in epic.  Care Gaps: AWV - completed 03/11/2022, scheduled 03/16/2023 Last eye exam / retinopathy screening: 01/15/2022 Last diabetic foot exam: 10/28/2021 Shingrix - never done Urine ACR - overdue Covid - overdue HGA1C - overdue Tdap - overdue  Star Rating Drugs: Rosuvastatin 40m - [phone]

## 2022-08-24 ENCOUNTER — Ambulatory Visit: Payer: Medicare Other

## 2022-09-10 ENCOUNTER — Telehealth: Payer: Self-pay

## 2022-09-10 NOTE — Progress Notes (Unsigned)
Care Management & Coordination Services Pharmacy Team  Reason for Encounter: Hypertension  Contacted patient to discuss hypertension disease state. {US HC Outreach:28874}   Current antihypertensive regimen:  Amlodipine 10 mg 1/2 daily Patient verbally confirms he is taking the above medications as directed. {yes/no:20286}  How often are you checking your Blood Pressure? {CHL HP BP Monitoring [phone]}  he checks his blood pressure {timing:25218} {before/after:25217} taking his medication.  Current home BP readings: Get full bp log from pt if he has been checking  DATE:             BP               PULSE   Wrist or arm cuff:  Any readings above 180/100? {yes/no:20286} If yes any symptoms of hypertensive emergency? {hypertensive emergency symptoms:25354}  What recent interventions/DTPs have been made by any provider to improve Blood Pressure control since last CPP Visit: Check BP every 1 to 2 days and keep log to monitor bp control in order to consider future addition of low-dose ACEI/ARB for kidney protection   Any recent hospitalizations or ED visits since last visit with CPP? {yes/no:20286}  Adherence Review: Is the patient currently on ACE/ARB medication? No Does the patient have >5 day gap between last estimated fill dates? No  Care Gaps: AWV - completed 03/11/2022, scheduled 03/16/2023 Last eye exam / retinopathy screening: 01/15/2022 Last diabetic foot exam: 10/28/2021 Shingrix - never done Urine ACR - overdue Covid - overdue HGA1C - overdue Tdap - overdue   Star Rating Drugs: Rosuvastatin '10mg'$  - last filled 08/30/2022 100 DS at [facility]  Chart Updates: Recent office visits:  None  Recent consult visits:  None  Hospital visits:  None  Medications: Outpatient Encounter Medications as of 09/10/2022  Medication Sig Note   amLODipine (NORVASC) 10 MG tablet TAKE ONE-HALF TABLET BY MOUTH  DAILY    aspirin 81 MG tablet Take 81 mg by mouth daily.     insulin glargine (LANTUS) 100 UNIT/ML injection Inject 20 Units into the skin daily.    ONETOUCH ULTRA test strip CHECK BLOOD SUGAR 3 TIMES A DAY OR AS DIRECTED    pantoprazole (PROTONIX) 40 MG tablet TAKE 1 TABLET BY MOUTH DAILY    rosuvastatin (CRESTOR) 10 MG tablet Take 10 daily 6 times a week    traZODone (DESYREL) 50 MG tablet Take 0.5 tablets (25 mg total) by mouth at bedtime. (Patient not taking: Reported on 01/07/2022) 01/07/2022: Patient would like to talk with provider before taking   [place] X 6 MM MISC USE AS DIRECTED    No facility-administered encounter medications on file as of 09/10/2022.    Recent Office Vitals: BP Readings from Last 3 Encounters:  01/07/22 140/72  12/28/21 120/60  07/09/21 130/64   Pulse Readings from Last 3 Encounters:  01/07/22 (!) 56  12/28/21 (!) 56  07/09/21 68    Wt Readings from Last 3 Encounters:  03/11/22 153 lb (69.4 kg)  01/07/22 160 lb (72.6 kg)  12/28/21 160 lb 4 oz (72.7 kg)     Kidney Function Lab Results  Component Value Date/Time   CREATININE 1.14 12/31/2021 08:14 AM   CREATININE 1.23 10/16/2020 08:37 AM   CREATININE 1.19 (H) 04/11/2020 10:49 AM   CREATININE 1.03 04/03/2014 04:48 PM   GFR 64.41 07/03/2018 04:37 PM   GFRNONAA 60 04/11/2020 10:49 AM   GFRAA 69 04/11/2020 10:49 AM       Latest Ref Rng & Units 12/31/2021  8:14 AM 10/16/2020    8:37 AM 04/11/2020   10:49 AM  BMP  Glucose 70 - 99 mg/dL 145  129  184   BUN 8 - 27 mg/dL '14  14  16   '$ Creatinine 0.76 - 1.27 mg/dL 1.14  1.23  1.19   BUN/Creat Ratio 10 - '24 12  11  13   '$ Sodium 134 - 144 mmol/L 139  140  139   Potassium 3.5 - 5.2 mmol/L 4.0  4.1  4.3   Chloride 96 - 106 mmol/L 102  102  102   CO2 20 - 29 mmol/L '26  24  25   '$ Calcium 8.6 - 10.2 mg/dL 9.3  9.4  9.6    [place] Pharmacist Assistant [phone]

## 2022-11-03 ENCOUNTER — Other Ambulatory Visit: Payer: Self-pay | Admitting: Cardiovascular Disease

## 2022-11-09 DIAGNOSIS — I10 Essential (primary) hypertension: Secondary | ICD-10-CM | POA: Diagnosis not present

## 2022-11-09 DIAGNOSIS — N182 Chronic kidney disease, stage 2 (mild): Secondary | ICD-10-CM | POA: Diagnosis not present

## 2022-11-09 DIAGNOSIS — E1122 Type 2 diabetes mellitus with diabetic chronic kidney disease: Secondary | ICD-10-CM | POA: Diagnosis not present

## 2022-11-09 DIAGNOSIS — I25810 Atherosclerosis of coronary artery bypass graft(s) without angina pectoris: Secondary | ICD-10-CM | POA: Diagnosis not present

## 2022-11-09 DIAGNOSIS — E1165 Type 2 diabetes mellitus with hyperglycemia: Secondary | ICD-10-CM | POA: Diagnosis not present

## 2022-11-12 DIAGNOSIS — H6523 Chronic serous otitis media, bilateral: Secondary | ICD-10-CM | POA: Diagnosis not present

## 2022-11-12 DIAGNOSIS — H906 Mixed conductive and sensorineural hearing loss, bilateral: Secondary | ICD-10-CM | POA: Diagnosis not present

## 2022-11-15 DIAGNOSIS — H90A32 Mixed conductive and sensorineural hearing loss, unilateral, left ear with restricted hearing on the contralateral side: Secondary | ICD-10-CM | POA: Diagnosis not present

## 2022-11-19 DIAGNOSIS — H6993 Unspecified Eustachian tube disorder, bilateral: Secondary | ICD-10-CM | POA: Diagnosis not present

## 2022-11-19 DIAGNOSIS — H6522 Chronic serous otitis media, left ear: Secondary | ICD-10-CM | POA: Diagnosis not present

## 2022-12-03 ENCOUNTER — Telehealth: Payer: Self-pay

## 2022-12-03 NOTE — Progress Notes (Unsigned)
Care Management & Coordination Services Pharmacy Team  Reason for Encounter: Hypertension  Contacted patient to discuss hypertension disease state. {US HC Outreach:28874}    Current antihypertensive regimen:  Amlodipine 10 mg 1/2 tablet daily  Patient verbally confirms he is taking the above medications as directed. {yes/no:20286}  How often are you checking your Blood Pressure? {CHL HP BP Monitoring Frequency:[phone]}  he checks his blood pressure {timing:25218} {before/after:25217} taking his medication.  Current home BP readings: *** DATE:             BP               PULSE   Any readings above 180/100? {yes/no:20286} If yes any symptoms of hypertensive emergency? {hypertensive emergency symptoms:25354}  What recent interventions/DTPs have been made by any provider to improve Blood Pressure control since last CPP Visit: No recent interventions  Any recent hospitalizations or ED visits since last visit with CPP? No recent hospital visits  What diet changes have been made to improve Blood Pressure Control?  Patient follows Breakfast -  Lunch -  Dinner -  Caffeine intake - Salt intake -  What exercise is being done to improve your Blood Pressure Control?  ***  Adherence Review: Is the patient currently on ACE/ARB medication? No Does the patient have >5 day gap between last estimated fill dates? No  Care Gaps: AWV - completed 03/11/2022 Last BP - 140/72 at 01/08/2023 Last A1C - 8.6 on 12/25/2018 Last eye exam - 01/15/2022 Last foot exam: 10/28/2021 Shingrix - never done Urine ACR - overdue Covid - overdue HGA1C - overdue Tdap - overdue   Star Rating Drugs: Rosuvastatin 10mg  - last filled 11/08/2022 100 DS at [facility]   Chart Updates: Recent office visits:  None  Recent consult visits:  None  Hospital visits:  None  Medications: Outpatient Encounter Medications as of 12/03/2022  Medication Sig Note   amLODipine (NORVASC) 10 MG tablet TAKE ONE-HALF  TABLET BY MOUTH  DAILY    aspirin 81 MG tablet Take 81 mg by mouth daily.    insulin glargine (LANTUS) 100 UNIT/ML injection Inject 20 Units into the skin daily.    ONETOUCH ULTRA test strip CHECK BLOOD SUGAR 3 TIMES A DAY OR AS DIRECTED    pantoprazole (PROTONIX) 40 MG tablet TAKE 1 TABLET BY MOUTH DAILY    rosuvastatin (CRESTOR) 10 MG tablet TAKE 1 TABLET BY MOUTH DAILY 6  TIMES WEEKLY    traZODone (DESYREL) 50 MG tablet Take 0.5 tablets (25 mg total) by mouth at bedtime. (Patient not taking: Reported on 01/07/2022) 01/07/2022: Patient would like to talk with provider before taking   ULTICARE MINI PEN NEEDLES 31G X 6 MM MISC USE AS DIRECTED    No facility-administered encounter medications on file as of 12/03/2022.  Fill History:  Dispensed Days Supply Quantity Provider Pharmacy  AMLODIPINE BESYLATE  10 MG TABS 11/02/2022 100 50 tablet      Dispensed Days Supply Quantity Provider Pharmacy  LANTUS SOLOSTAR  100 UNIT/ML SOPN 11/17/2021 30 6 mL      Dispensed Days Supply Quantity Provider Pharmacy  PANTOPRAZOLE SODIUM  40 MG TBEC 10/05/2022 100 100 tablet      Dispensed Days Supply Quantity Provider Pharmacy  ROSUVASTATIN CALCIUM  10 MG TABS 11/08/2022 100 86 tablet      Dispensed Days Supply Quantity Provider Pharmacy  trazodone 50 mg tablet 12/28/2021 60 30 each     Recent Office Vitals: BP Readings from Last 3 Encounters:  01/07/22 140/72  12/28/21 120/60  07/09/21 130/64   Pulse Readings from Last 3 Encounters:  01/07/22 (!) 56  12/28/21 (!) 56  07/09/21 68    Wt Readings from Last 3 Encounters:  03/11/22 153 lb (69.4 kg)  01/07/22 160 lb (72.6 kg)  12/28/21 160 lb 4 oz (72.7 kg)     Kidney Function Lab Results  Component Value Date/Time   CREATININE 1.14 12/31/2021 08:14 AM   CREATININE 1.23 10/16/2020 08:37 AM   CREATININE 1.19 (H) 04/11/2020 10:49 AM   CREATININE 1.03 04/03/2014 04:48 PM   GFR 64.41 07/03/2018 04:37 PM   GFRNONAA 60 04/11/2020 10:49 AM   GFRAA  69 04/11/2020 10:49 AM       Latest Ref Rng & Units 12/31/2021    8:14 AM 10/16/2020    8:37 AM 04/11/2020   10:49 AM  BMP  Glucose 70 - 99 mg/dL 161  096  045   BUN 8 - 27 mg/dL 14  14  16    Creatinine 0.76 - 1.27 mg/dL 4.09  8.11  9.14   BUN/Creat Ratio 10 - 24 12  11  13    Sodium 134 - 144 mmol/L 139  140  139   Potassium 3.5 - 5.2 mmol/L 4.0  4.1  4.3   Chloride 96 - 106 mmol/L 102  102  102   CO2 20 - 29 mmol/L 26  24  25    Calcium 8.6 - 10.2 mg/dL 9.3  9.4  9.6    Inetta Fermo [facility]  Clinical Pharmacist Assistant [phone]

## 2022-12-22 ENCOUNTER — Telehealth: Payer: Self-pay | Admitting: Emergency Medicine

## 2022-12-22 DIAGNOSIS — I25810 Atherosclerosis of coronary artery bypass graft(s) without angina pectoris: Secondary | ICD-10-CM

## 2022-12-22 DIAGNOSIS — E785 Hyperlipidemia, unspecified: Secondary | ICD-10-CM

## 2022-12-22 NOTE — Telephone Encounter (Signed)
Pt's wife reports that her and her husband have appt with Dr [name] next Wednesday. She already has orders to get blood work on Monday, but was wondering if her husband could get lab work on Monday before the appt as well. Looked at past OV- historically CMET and Lipid panel is ordered yearly.  Placed orders for Lipid panel and CMET- aware to be fasting. Verbalized understanding.

## 2022-12-27 ENCOUNTER — Other Ambulatory Visit: Payer: Self-pay

## 2022-12-27 DIAGNOSIS — E785 Hyperlipidemia, unspecified: Secondary | ICD-10-CM

## 2022-12-27 DIAGNOSIS — I25810 Atherosclerosis of coronary artery bypass graft(s) without angina pectoris: Secondary | ICD-10-CM | POA: Diagnosis not present

## 2022-12-27 LAB — LIPID PANEL
Chol/HDL Ratio: 4.5 ratio (ref 0.0–5.0)
Cholesterol, Total: 153 mg/dL (ref 100–199)
HDL: 34 mg/dL — ABNORMAL LOW (ref >39)
LDL Chol Calc (NIH): 89 mg/dL (ref 0–99)
Triglycerides: 171 mg/dL — ABNORMAL HIGH (ref 0–149)
VLDL Cholesterol Cal: 30 mg/dL (ref 5–40)

## 2022-12-27 LAB — COMPREHENSIVE METABOLIC PANEL
ALT: 15 IU/L (ref 0–44)
AST: 36 IU/L (ref 0–40)
Albumin: 4.3 g/dL (ref 3.8–4.8)
Alkaline Phosphatase: 103 IU/L (ref 44–121)
BUN/Creatinine Ratio: 13 (ref 10–24)
BUN: 16 mg/dL (ref 8–27)
Bilirubin Total: 0.9 mg/dL (ref 0.0–1.2)
CO2: 25 mmol/L (ref 20–29)
Calcium: 9.2 mg/dL (ref 8.6–10.2)
Chloride: 103 mmol/L (ref 96–106)
Creatinine, Ser: 1.24 mg/dL (ref 0.76–1.27)
Globulin, Total: 2.3 g/dL (ref 1.5–4.5)
Glucose: 123 mg/dL — ABNORMAL HIGH (ref 70–99)
Potassium: 4.3 mmol/L (ref 3.5–5.2)
Sodium: 141 mmol/L (ref 134–144)
Total Protein: 6.6 g/dL (ref 6.0–8.5)
eGFR: 60 mL/min/{1.73_m2} (ref >59)

## 2022-12-29 ENCOUNTER — Encounter: Payer: Self-pay | Admitting: Cardiovascular Disease

## 2022-12-29 ENCOUNTER — Ambulatory Visit (INDEPENDENT_AMBULATORY_CARE_PROVIDER_SITE_OTHER): Payer: Medicare Other

## 2022-12-29 ENCOUNTER — Ambulatory Visit: Payer: Medicare Other | Attending: Cardiovascular Disease | Admitting: Cardiovascular Disease

## 2022-12-29 VITALS — BP 140/78 | Ht 69.0 in | Wt 168.4 lb

## 2022-12-29 DIAGNOSIS — E1140 Type 2 diabetes mellitus with diabetic neuropathy, unspecified: Secondary | ICD-10-CM

## 2022-12-29 DIAGNOSIS — Z794 Long term (current) use of insulin: Secondary | ICD-10-CM

## 2022-12-29 DIAGNOSIS — I10 Essential (primary) hypertension: Secondary | ICD-10-CM

## 2022-12-29 DIAGNOSIS — R002 Palpitations: Secondary | ICD-10-CM

## 2022-12-29 DIAGNOSIS — I25119 Atherosclerotic heart disease of native coronary artery with unspecified angina pectoris: Secondary | ICD-10-CM

## 2022-12-29 DIAGNOSIS — I5032 Chronic diastolic (congestive) heart failure: Secondary | ICD-10-CM

## 2022-12-29 DIAGNOSIS — I739 Peripheral vascular disease, unspecified: Secondary | ICD-10-CM | POA: Diagnosis not present

## 2022-12-29 DIAGNOSIS — E785 Hyperlipidemia, unspecified: Secondary | ICD-10-CM

## 2022-12-29 MED ORDER — ROSUVASTATIN CALCIUM 20 MG PO TABS: 20.0000 mg | ORAL_TABLET | Freq: Every day | ORAL | 3 refills | Status: DC

## 2022-12-29 NOTE — Patient Instructions (Addendum)
Medication Instructions:  Increase Rosuvastatin to 20 mg every evening *If you need a refill on your cardiac medications before your next appointment, please call your pharmacy*  Testing/Procedures: Your physician has recommended that you wear a 14 DAY ZIO-PATCH monitor. The Zio patch cardiac monitor continuously records heart rhythm data for up to 14 days, this is for patients being evaluated for multiple types heart rhythms. For the first 24 hours post application, please avoid getting the Zio monitor wet in the shower or by excessive sweating during exercise. After that, feel free to carry on with regular activities. Keep soaps and lotions away from the ZIO XT Patch.  This will be mailed to you, please expect 7-10 days to receive.    Applying the monitor   Shave hair from upper left chest.   Hold abrader disc by orange tab.  Rub abrader in 40 strokes over left upper chest as indicated in your monitor instructions.   Clean area with 4 enclosed alcohol pads .  Use all pads to assure are is cleaned thoroughly.  Let dry.   Apply patch as indicated in monitor instructions.  Patch will be place under collarbone on left side of chest with arrow pointing upward.   Rub patch adhesive wings for 2 minutes.Remove white label marked "1".  Remove white label marked "2".  Rub patch adhesive wings for 2 additional minutes.   While looking in a mirror, press and release button in center of patch.  A small green light will flash 3-4 times .  This will be your only indicator the monitor has been turned on.     Do not shower for the first 24 hours.  You may shower after the first 24 hours.   Press button if you feel a symptom. You will hear a small click.  Record Date, Time and Symptom in the Patient Log Book.   When you are ready to remove patch, follow instructions on last 2 pages of Patient Log Book.  Stick patch monitor onto last page of Patient Log Book.   Place Patient Log Book in [place] box.  Use  locking tab on box and tape box closed securely.  The Orange and [employer] has [employer] on it.  Please place in mailbox as soon as possible.  Your physician should have your test results approximately 7 days after the monitor has been mailed back to [facility].   Call [facility] Customer Care at [phone] if you have questions regarding your ZIO XT patch monitor.  Call them immediately if you see an orange light blinking on your monitor.   If your monitor falls off in less than 4 days contact our Monitor department at [phone].  If your monitor becomes loose or falls off after 4 days call [employer] at [phone] for suggestions on securing your monitor    Follow-Up: At [facility], you and your health needs are our priority.  As part of our continuing mission to provide you with exceptional heart care, we have created designated Provider Care Teams.  These Care Teams include your primary Cardiologist (physician) and Advanced Practice Providers (APPs -  Physician Assistants and Nurse Practitioners) who all work together to provide you with the care you need, when you need it.  We recommend signing up for the patient portal called "MyChart".  Sign up information is provided on this After Visit Summary.  MyChart is used to connect with patients for Virtual Visits (Telemedicine).  Patients are able to view  lab/test results, encounter notes, upcoming appointments, etc.  Non-urgent messages can be sent to your provider as well.   To learn more about what you can do with MyChart, go to [email or web address].    Your next appointment:   1 year(s)  Provider:   [name], MD

## 2022-12-29 NOTE — Progress Notes (Signed)
Patient ID: [name], male   DOB: 1945-09-02, 77 y.o.   MRN: [id number]    Cardiology Office Note    Date:  12/30/2022   ID:  Scott, Steele [birth year], MRN [id number]  PCP:  [name], [name], MD  Cardiologist:   [name], MD   No chief complaint on file.   History of Present Illness:  [name] is a 77 y.o. male with a long-standing history of coronary artery disease who returns for follow-up.  As always, he is accompanied by his wife [name], who is also my patient.   He has had 2 or 3 episodes of "burning" in his throat and shoulders that have occurred at rest.  The burning sensation is similar to his previous description of his angina.  At the same time he felt his "heart flying".  He has not had any symptoms such as this during physical exertion.  It has also been over a month since he last had 1 of these episodes.  He seems to be doing better with his problems with anxiety, but seems to remain depressed.  Continues to sleep very poorly.  Taking the trazodone because "it was not helping", but he was only taking half of a 50 mg tablet.  He does not have exertional dyspnea, orthopnea, PND, lower extremity edema, focal neurological complaints, claudication, syncope.  Glycemic control is fair with a hemoglobin A1c of 7.1% and he has an excellent LDL of only 59, but his HDL is chronically low at 31.  He had side effects to multiple newer diabetes medications and is now on Lantus, seeing [name].    He last had ultrasound for his abdominal aortic aneurysm/iliac artery aneurysm in July 2022 (he was then told by his vascular surgeon that he no longer needs follow-up). EVAR stent remains patent, without endoleak, maximum diameter 2.1 cm.  Lower extremities had normal ABIs bilaterally in 2019.  He saw [name] to evaluate numerous atypical neurological complaints including memory deficit.  His MRI showed a moderate level of small vessel disease and atrophy which could  explain his memory difficulties.  There was much more pronounced atrophy in the frontal and temporal insular region especially on the left side, raising the possibility that he could have frontotemporal dementia or Lewy body disease.    March 08, 2019 FNA of thyroid nodule showed benign findings.  He underwent four-vessel bypass surgery in 2006 (LIMA-LAD, SVG x 3 to Diagonal, OM and PDA). In 2015 received a bare metal stent to the proximal RCA (4.5 mm vessel). On September 08, 2017 he underwent cardiac catheterization which showed a long segment of severe stenosis in the SVG to OM (chronically occluded circumflex), patent LIMA to occluded LAD proximal RCA stent with 80% stenosis in the distal branch of the RCA.  September 09, 2017 he underwent revascularization of the SVG-OM with 2 drug-eluting stents.  He did not tolerate Brilinta and asked to be switched to clopidogrel. He has had EVAR for AAA amd received a stent in the large right common iliac artery aneurysm and is monitored at [facility].  He has insulin requiring diabetes mellitus. He has chronically low HDL cholesterol.  Past Medical History:  Diagnosis Date   Anxiety    Arthritis    "back" (04/05/2014)   Atrial fibrillation (HCC)    Blood in stool    Bradycardia- with decrease of BB some improvement 09/10/2017   CAD (coronary artery disease)    CAD s/p CABG 2006  08/16/2013   a. [name] - to LAD, SVG to diagonal, SVG to OM, SVG to PDA ;  b.  LHC (9/15): ostial LAD occl, ostial CFX 60%, prox AVCFX 95%, RCA 95%, S-PDA occl, S-OM/Dx with Dx limb occl and prox 60%, L-LAD patent, EF 35-40% wtih ant-lat and apical HK-AK >> PCI:  BMS to native RCA    Diverticulosis    Dizziness    DM type 2 causing complication (HCC) 08/16/2013   GERD (gastroesophageal reflux disease)    Hypertension    Iliac artery aneurysm (HCC)    Ischemic cardiomyopathy    a. EF 35-40% by LV gram 03/2014   Joint pain    Kidney stones    Kidney stones    Lower back pain Jan 2015    Mixed hyperlipidemia 08/16/2013   Myocardial infarction (HCC) 03/2014 X 2?   S/P angioplasty with stent DES mid SVG to OM and DES to prox. SVG to OM, DAPT for lifetime 09/10/2017   Type II diabetes mellitus [facility])     Past Surgical History:  Procedure Laterality Date   CARDIAC CATHETERIZATION  2006   "before [facility]"   CORONARY ANGIOPLASTY WITH STENT PLACEMENT  04/05/2014   "1"   CORONARY ARTERY BYPASS GRAFT  2006   LIMA to LAD,SVG to diagonal,SVG to obtuse marginal,SVG to posterior descending   CORONARY STENT INTERVENTION N/A 09/09/2017   Procedure: CORONARY STENT INTERVENTION;  Surgeon: Kathleene Hazel, MD;  Location: [facility] INVASIVE CV LAB;  Service: Cardiovascular;  Laterality: N/A;   CORONARY STENT PLACEMENT  09/09/2017   CYSTOSCOPY W/ STONE MANIPULATION  1970's X 2   CYSTOSCOPY W/ URETEROSCOPY W/ LITHOTRIPSY  ~ 2009   ILIAC ARTERY STENT Left Aug. 10, 2011   Aorta to right ext iliac and left CIA stent repair    LEFT HEART CATH AND CORS/GRAFTS ANGIOGRAPHY N/A 09/08/2017   Procedure: LEFT HEART CATH AND CORS/GRAFTS ANGIOGRAPHY;  Surgeon: Lennette Bihari, MD;  Location: [facility] INVASIVE CV LAB;  Service: Cardiovascular;  Laterality: N/A;   LEFT HEART CATHETERIZATION WITH CORONARY/GRAFT ANGIOGRAM N/A 04/05/2014   Procedure: LEFT HEART CATHETERIZATION WITH Isabel Caprice;  Surgeon: Lennette Bihari, MD;  Location: [facility] CATH LAB;  Service: Cardiovascular;  Laterality: N/A;   NM MYOCAR PERF WALL MOTION  11/18/2009   low risk   TONSILLECTOMY AND ADENOIDECTOMY  1951    Outpatient Medications Prior to Visit  Medication Sig Dispense Refill   amLODipine (NORVASC) 10 MG tablet TAKE ONE-HALF TABLET BY MOUTH  DAILY 90 tablet 3   aspirin 81 MG tablet Take 81 mg by mouth daily.     insulin glargine (LANTUS) 100 UNIT/ML injection Inject 20 Units into the skin daily.     ONETOUCH ULTRA test strip CHECK BLOOD SUGAR 3 TIMES A DAY OR AS DIRECTED 100 strip 0   pantoprazole (PROTONIX) 40 MG tablet TAKE 1  TABLET BY MOUTH DAILY 100 tablet 2   traZODone (DESYREL) 50 MG tablet Take 0.5 tablets (25 mg total) by mouth at bedtime. 30 tablet 0   ULTICARE MINI PEN NEEDLES 31G X 6 MM MISC USE AS DIRECTED 100 each 1   rosuvastatin (CRESTOR) 10 MG tablet TAKE 1 TABLET BY MOUTH DAILY 6  TIMES WEEKLY 90 tablet 0   nitroGLYCERIN (NITROSTAT) 0.4 MG SL tablet Place 0.4 mg under the tongue every 5 (five) minutes as needed for chest pain. (Patient not taking: Reported on 12/29/2022)     No facility-administered medications prior to visit.  Allergies:   Codeine, Valium, Empagliflozin, Simvastatin, and Sitagliptin   Social History   Socioeconomic History   Marital status: Married    Spouse name: Not on file   Number of children: 3   Years of education: Not on file   Highest education level: Not on file  Occupational History   Not on file  Tobacco Use   Smoking status: Former    Types: Cigars    Quit date: 09/09/2017    Years since quitting: 5.3   Smokeless tobacco: Never   Tobacco comments:    states he smoked cigar 1-2 per year.    01/07/2022 patient stated he smoked 1 cigarette before coming to appointment  Vaping Use   Vaping Use: Never used  Substance and Sexual Activity   Alcohol use: No   Drug use: No   Sexual activity: Yes  Other Topics Concern   Not on file  Social History Narrative   Lives with wife   Caffeine use: Soda sometimes   1/2 cup coffee in the morning sometimes   Right handed    Social Determinants of Health   Financial Resource Strain: Low Risk  (08/24/2022)   Overall Financial Resource Strain (CARDIA)    Difficulty of Paying Living Expenses: Not very hard  Food Insecurity: No Food Insecurity (08/24/2022)   Hunger Vital Sign    Worried About Running Out of Food in the Last Year: Never true    Ran Out of Food in the Last Year: Never true  Transportation Needs: No Transportation Needs (08/24/2022)   PRAPARE - Administrator, Civil Service (Medical): No     Lack of Transportation (Non-Medical): No  Physical Activity: Sufficiently Active (03/11/2022)   Exercise Vital Sign    Days of Exercise per Week: 6 days    Minutes of Exercise per Session: 150+ min  Stress: No Stress Concern Present (03/11/2022)   [employer] of Occupational Health - Occupational Stress Questionnaire    Feeling of Stress : Not at all  Social Connections: Socially Integrated (03/11/2022)   Social Connection and Isolation Panel [NHANES]    Frequency of Communication with Friends and Family: More than three times a week    Frequency of Social Gatherings with Friends and Family: More than three times a week    Attends Religious Services: More than 4 times per year    Active Member of [employer] or Organizations: Yes    Attends Engineer, structural: More than 4 times per year    Marital Status: Married     Family History:  The patient's family history includes Diabetes in his brother and mother; Heart disease in his mother, sister, and son; Hypertension in his mother.   ROS:   Please see the history of present illness.    ROS All other systems are reviewed and are negative.   PHYSICAL EXAM:   VS:  BP (!) 140/78 (BP Location: Left Arm, Patient Position: Sitting, Cuff Size: Normal)   Ht 5\' 9"  (1.753 m)   Wt 168 lb 6.4 oz (76.4 kg)   SpO2 96%   BMI 24.87 kg/m       General: Alert, oriented x3, no distress, lean. Head: no evidence of trauma, PERRL, EOMI, no exophtalmos or lid lag, no myxedema, no xanthelasma; normal ears, nose and oropharynx Neck: normal jugular venous pulsations and no hepatojugular reflux; brisk carotid pulses without delay and no carotid bruits Chest: clear to auscultation, no signs of consolidation by percussion or palpation,  normal fremitus, symmetrical and full respiratory excursions Cardiovascular: normal position and quality of the apical impulse, regular rhythm, normal first and second heart sounds, no murmurs, rubs or gallops Abdomen:  no tenderness or distention, no masses by palpation, no abnormal pulsatility or arterial bruits, normal bowel sounds, no hepatosplenomegaly Extremities: no clubbing, cyanosis or edema; 2+ radial, ulnar and brachial pulses bilaterally; 2+ right femoral, posterior tibial and dorsalis pedis pulses; 2+ left femoral, posterior tibial and dorsalis pedis pulses; no subclavian or femoral bruits Neurological: grossly nonfocal Psych: Normal mood and affect      Wt Readings from Last 3 Encounters:  12/29/22 168 lb 6.4 oz (76.4 kg)  03/11/22 153 lb (69.4 kg)  01/07/22 160 lb (72.6 kg)      Studies/Labs Reviewed:   EKG:  EKG is ordered today.  He is in sinus rhythm.  It is a normal tracing other than borderline prolonged QTc, which we have seen on several of his previous tracings. Recent Labs: BMET    Component Value Date/Time   NA 141 12/27/2022 1017   K 4.3 12/27/2022 1017   CL 103 12/27/2022 1017   CO2 25 12/27/2022 1017   GLUCOSE 123 (H) 12/27/2022 1017   GLUCOSE 184 (H) 04/11/2020 1049   BUN 16 12/27/2022 1017   CREATININE 1.24 12/27/2022 1017   CREATININE 1.19 (H) 04/11/2020 1049   CALCIUM 9.2 12/27/2022 1017   GFRNONAA 60 04/11/2020 1049   GFRAA 69 04/11/2020 1049     Lipid Panel     Component Value Date/Time   CHOL 153 12/27/2022 1017   TRIG 171 (H) 12/27/2022 1017   HDL 34 (L) 12/27/2022 1017   CHOLHDL 4.5 12/27/2022 1017   CHOLHDL 4.6 04/11/2020 1049   VLDL 53.0 (H) 12/28/2016 0936   LDLCALC 89 12/27/2022 1017   LDLCALC 86 04/11/2020 1049   LDLDIRECT 74.0 12/28/2016 0936   CBC:    Component Value Date/Time   WBC 8.8 03/21/2018 1202   HGB 15.3 03/21/2018 1202   HGB 14.4 11/28/2017 1150   HCT 44.4 03/21/2018 1202   HCT 42.3 11/28/2017 1150   PLT 267.0 03/21/2018 1202   PLT 161 11/28/2017 1150   MCV 84.0 03/21/2018 1202   MCV 88 11/28/2017 1150   NEUTROABS 11.0 (H) 02/02/2014 0009   LYMPHSABS 0.7 02/02/2014 0009   MONOABS 0.8 02/02/2014 0009   EOSABS 0.0  02/02/2014 0009   BASOSABS 0.0 02/02/2014 0009     ASSESSMENT:    1. Coronary artery disease involving native coronary artery of native heart with angina pectoris (HCC)   2. Chronic diastolic heart failure (HCC)   3. Palpitations   4. Essential hypertension   5. PAD (peripheral artery disease) (HCC)   6. Dyslipidemia (high LDL; low HDL)   7. Type 2 diabetes mellitus with diabetic neuropathy, with long-term current use of insulin (HCC)      PLAN:  In order of problems listed above:  CAD: This is the first time he is described symptoms suggestive of angina pectoris since his last PCI (DES x2 to SVG-OM) in March 2019.  However, the episodes occurred at rest associated with rapid palpitations.  Will check for arrhythmia with a 14-day monitor. CHF: Appears clinically euvolemic, NYHA functional class I.  He had transient reduction in left ventricular systolic function related to ischemia, last echo showed normal ventricular function.  HTN: Borderline control, usually better control.  Avoiding beta-blockers due to bradycardia.  On ARB for renal protection in the setting of diabetes mellitus  on insulin, amlodipine may have benefit as antianginal. PAD: Asymptomatic.  He does not have claudication.  History of EVAR stent graft for aorto-iliac aneurysm in 2011, being followed in the vascular surgery clinic.  Favorable findings on last scan in July 2022.  HLP: LDL at target, triglycerides have improved a little bit, HDL remains low.  Continue current statin dose. DM: Fair control with hemoglobin A1c improved at 7.1%.  Did not tolerate SGLT2 inhibitors (Jardiance). Cognitive issues: He does have some small vessel disease and a peculiar pattern of atrophy involving the frontotemporal region, but no specific diagnosis is firmly established.  Symptomatically he seems improved, although he still has some agitation.  Brilinta did seem to make this anxiety worse. Depression: Not sure how it ties in with his  cognitive issues, but he clearly has symptoms of depression.  I encouraged him to try taking the trazodone again, but to take the full 50 mg tablet to see if it helps with his mood and with his sleep.   Medication Adjustments/Labs and Tests Ordered: Current medicines are reviewed at length with the patient today.  Concerns regarding medicines are outlined above.  Medication changes, Labs and Tests ordered today are listed in the Patient Instructions below. Patient Instructions  Medication Instructions:  Increase Rosuvastatin to 20 mg every evening *If you need a refill on your cardiac medications before your next appointment, please call your pharmacy*  Testing/Procedures: Your physician has recommended that you wear a 14 DAY ZIO-PATCH monitor. The Zio patch cardiac monitor continuously records heart rhythm data for up to 14 days, this is for patients being evaluated for multiple types heart rhythms. For the first 24 hours post application, please avoid getting the Zio monitor wet in the shower or by excessive sweating during exercise. After that, feel free to carry on with regular activities. Keep soaps and lotions away from the ZIO XT Patch.  This will be mailed to you, please expect 7-10 days to receive.    Applying the monitor   Shave hair from upper left chest.   Hold abrader disc by orange tab.  Rub abrader in 40 strokes over left upper chest as indicated in your monitor instructions.   Clean area with 4 enclosed alcohol pads .  Use all pads to assure are is cleaned thoroughly.  Let dry.   Apply patch as indicated in monitor instructions.  Patch will be place under collarbone on left side of chest with arrow pointing upward.   Rub patch adhesive wings for 2 minutes.Remove white label marked "1".  Remove white label marked "2".  Rub patch adhesive wings for 2 additional minutes.   While looking in a mirror, press and release button in center of patch.  A small green light will flash  3-4 times .  This will be your only indicator the monitor has been turned on.     Do not shower for the first 24 hours.  You may shower after the first 24 hours.   Press button if you feel a symptom. You will hear a small click.  Record Date, Time and Symptom in the Patient Log Book.   When you are ready to remove patch, follow instructions on last 2 pages of Patient Log Book.  Stick patch monitor onto last page of Patient Log Book.   Place Patient Log Book in [place] box.  Use locking tab on box and tape box closed securely.  The [employer] and [employer] has [employer] on it.  Please place in mailbox as soon as possible.  Your physician should have your test results approximately 7 days after the monitor has been mailed back to [facility].   Call [facility] Customer Care at [phone] if you have questions regarding your ZIO XT patch monitor.  Call them immediately if you see an orange light blinking on your monitor.   If your monitor falls off in less than 4 days contact our Monitor department at [phone].  If your monitor becomes loose or falls off after 4 days call [employer] at [phone] for suggestions on securing your monitor    Follow-Up: At [facility], you and your health needs are our priority.  As part of our continuing mission to provide you with exceptional heart care, we have created designated Provider Care Teams.  These Care Teams include your primary Cardiologist (physician) and Advanced Practice Providers (APPs -  Physician Assistants and Nurse Practitioners) who all work together to provide you with the care you need, when you need it.  We recommend signing up for the patient portal called "MyChart".  Sign up information is provided on this After Visit Summary.  MyChart is used to connect with patients for Virtual Visits (Telemedicine).  Patients are able to view lab/test results, encounter notes, upcoming appointments, etc.  Non-urgent messages can be  sent to your provider as well.   To learn more about what you can do with MyChart, go to [email or web address].    Your next appointment:   1 year(s)  Provider:   [name], MD         Signed, [name], MD  12/30/2022 3:47 PM    [facility] [address] [place], [place], [place]  [address] Phone: [phone]; Fax: [phone]

## 2022-12-29 NOTE — Progress Notes (Unsigned)
Enrolled patient for a 14 day Zio XT  monitor to be mailed to patients home  °

## 2022-12-31 ENCOUNTER — Other Ambulatory Visit: Payer: Self-pay | Admitting: Emergency Medicine

## 2022-12-31 DIAGNOSIS — E782 Mixed hyperlipidemia: Secondary | ICD-10-CM

## 2022-12-31 DIAGNOSIS — I25119 Atherosclerotic heart disease of native coronary artery with unspecified angina pectoris: Secondary | ICD-10-CM

## 2023-01-04 DIAGNOSIS — R002 Palpitations: Secondary | ICD-10-CM

## 2023-01-07 ENCOUNTER — Other Ambulatory Visit: Payer: Self-pay | Admitting: Emergency Medicine

## 2023-01-07 DIAGNOSIS — I25119 Atherosclerotic heart disease of native coronary artery with unspecified angina pectoris: Secondary | ICD-10-CM

## 2023-01-07 DIAGNOSIS — E782 Mixed hyperlipidemia: Secondary | ICD-10-CM

## 2023-01-11 DIAGNOSIS — L281 Prurigo nodularis: Secondary | ICD-10-CM | POA: Diagnosis not present

## 2023-01-11 DIAGNOSIS — L720 Epidermal cyst: Secondary | ICD-10-CM | POA: Diagnosis not present

## 2023-01-13 ENCOUNTER — Other Ambulatory Visit: Payer: Self-pay | Admitting: Cardiovascular Disease

## 2023-01-19 DIAGNOSIS — H524 Presbyopia: Secondary | ICD-10-CM | POA: Diagnosis not present

## 2023-01-19 DIAGNOSIS — H2513 Age-related nuclear cataract, bilateral: Secondary | ICD-10-CM | POA: Diagnosis not present

## 2023-01-19 DIAGNOSIS — E119 Type 2 diabetes mellitus without complications: Secondary | ICD-10-CM | POA: Diagnosis not present

## 2023-01-19 DIAGNOSIS — H25043 Posterior subcapsular polar age-related cataract, bilateral: Secondary | ICD-10-CM | POA: Diagnosis not present

## 2023-01-19 DIAGNOSIS — H52203 Unspecified astigmatism, bilateral: Secondary | ICD-10-CM | POA: Diagnosis not present

## 2023-01-19 DIAGNOSIS — H5203 Hypermetropia, bilateral: Secondary | ICD-10-CM | POA: Diagnosis not present

## 2023-01-19 DIAGNOSIS — H25013 Cortical age-related cataract, bilateral: Secondary | ICD-10-CM | POA: Diagnosis not present

## 2023-01-19 LAB — HM DIABETES EYE EXAM

## 2023-01-26 ENCOUNTER — Telehealth: Payer: Self-pay | Admitting: Emergency Medicine

## 2023-01-26 DIAGNOSIS — R002 Palpitations: Secondary | ICD-10-CM | POA: Diagnosis not present

## 2023-01-26 MED ORDER — METOPROLOL SUCCINATE ER 25 MG PO TB24: 12.5000 mg | ORAL_TABLET | Freq: Every day | ORAL | 3 refills | Status: DC

## 2023-01-26 NOTE — Telephone Encounter (Signed)
Message Received: Today Croitoru, Mihai, MD  Scheryl Marten, RN Cc: [name], [name], MD No serious rhythm problems are seen on the monitor recording, but there are several episodes of very brief abnormal rhythm from the top chambers of the heart.  Longest episode was only 18 seconds in duration.  This is not atrial fibrillation and is not dangerous, but can cause palpitations and a sensation of anxiety.  In of itself, this rhythm probably does not need to be treated, but we can prescribe some medications that may help suppress it and make Rocky feel better. In the past, Abdirahim took a medicine called metoprolol and after that a different medicine called carvedilol for many years.  Either of these can be helpful for the rhythm abnormalities. I think we stopped them because his heart rate was relatively slow, but we did not see any dangerously slow rhythms on his monitor.    If he agrees I'd  like to prescribe him metoprolol succinate 12.5 mg once daily.  He agrees to the plan of care, verbalized understanding. Prescription sent to [facility]- pt preference.  Instructed to call with any questions/concerns

## 2023-02-23 DIAGNOSIS — E1122 Type 2 diabetes mellitus with diabetic chronic kidney disease: Secondary | ICD-10-CM | POA: Diagnosis not present

## 2023-02-23 DIAGNOSIS — N182 Chronic kidney disease, stage 2 (mild): Secondary | ICD-10-CM | POA: Diagnosis not present

## 2023-02-23 DIAGNOSIS — I10 Essential (primary) hypertension: Secondary | ICD-10-CM | POA: Diagnosis not present

## 2023-02-23 DIAGNOSIS — E1165 Type 2 diabetes mellitus with hyperglycemia: Secondary | ICD-10-CM | POA: Diagnosis not present

## 2023-02-23 DIAGNOSIS — E785 Hyperlipidemia, unspecified: Secondary | ICD-10-CM | POA: Diagnosis not present

## 2023-02-23 DIAGNOSIS — I2510 Atherosclerotic heart disease of native coronary artery without angina pectoris: Secondary | ICD-10-CM | POA: Diagnosis not present

## 2023-02-24 DIAGNOSIS — M7022 Olecranon bursitis, left elbow: Secondary | ICD-10-CM | POA: Diagnosis not present

## 2023-03-16 ENCOUNTER — Telehealth: Payer: Self-pay

## 2023-03-16 NOTE — Telephone Encounter (Signed)
Unsuccessful attempt to reach patient on preferred number listed in notes for scheduled AWV. Left message on voicemail okay to reschedule. 

## 2023-03-18 ENCOUNTER — Other Ambulatory Visit: Payer: Self-pay | Admitting: Family Medicine

## 2023-03-29 ENCOUNTER — Other Ambulatory Visit: Payer: Self-pay | Admitting: Cardiovascular Disease

## 2023-04-12 DIAGNOSIS — L720 Epidermal cyst: Secondary | ICD-10-CM | POA: Diagnosis not present

## 2023-05-03 ENCOUNTER — Other Ambulatory Visit: Payer: Medicare Other

## 2023-05-04 DIAGNOSIS — E1165 Type 2 diabetes mellitus with hyperglycemia: Secondary | ICD-10-CM | POA: Diagnosis not present

## 2023-05-04 LAB — HEMOGLOBIN A1C: Hemoglobin A1C: 8.4

## 2023-05-10 ENCOUNTER — Other Ambulatory Visit: Payer: Medicare Other

## 2023-05-10 NOTE — Progress Notes (Signed)
05/10/2023 Name: [name] MRN: [id number] DOB: 13-Aug-1945  Chief Complaint  Patient presents with   Hypertension   Medication Management    [name] is a 77 y.o. year old male who presented for a telephone visit.   They were referred to the pharmacist by their PCP for assistance in managing hypertension and complex medication management.    Subjective:  Care Team: Primary Care Provider: [name], [name], MD  Medication Access/Adherence  Current Pharmacy:  [facility] [place], [place] - [address] [place] [address] Phone: [phone] Fax: [phone]  [facility]) - [place], [place] - [address] [facility] [address] [place] [address] [place] [address] Phone: [phone] Fax: [phone]  [facility] Delivery - [place], [place] - [address] [place]  [address] Phone: [phone] Fax: [phone]   Patient reports affordability concerns with their medications: No  Patient reports access/transportation concerns to their pharmacy: No  Patient reports adherence concerns with their medications:  No     Diabetes:  Current medications: Lantus 20 units in the morning Medications tried in the past: Jardiance, Januvia  Current glucose readings: checks fasting, 166 this morning, no other readings to report at this time but been around that range  Patient denies hypoglycemic s/sx including dizziness, shakiness, sweating. Patient denies hyperglycemic symptoms including polyuria, polydipsia, polyphagia, nocturia, neuropathy, blurred vision.  Sees Dr. Remer Macho tmrw for DM follow up, will defer to her  Hypertension:  Current medications: Amlodipine, Metoprolol Medications previously tried: Carvedilol, Losartan  Patient has a validated, automated, upper arm home BP cuff Current blood pressure readings readings: not checking  Patient  denies hypotensive s/sx including dizziness, lightheadedness.  Patient denies hypertensive symptoms including headache, chest pain, shortness of breath  Current meal patterns: mindful of salt intake   Objective:  Lab Results  Component Value Date   HGBA1C 8.6 (H) 12/25/2018    Lab Results  Component Value Date   CREATININE 1.24 12/27/2022   BUN 16 12/27/2022   NA 141 12/27/2022   K 4.3 12/27/2022   CL 103 12/27/2022   CO2 25 12/27/2022    Lab Results  Component Value Date   CHOL 153 12/27/2022   HDL 34 (L) 12/27/2022   LDLCALC 89 12/27/2022   LDLDIRECT 74.0 12/28/2016   TRIG 171 (H) 12/27/2022   CHOLHDL 4.5 12/27/2022    Medications Reviewed Today     Reviewed by Sherrill Raring, RPH (Pharmacist) on 05/10/23 at 1129  Med List Status: <None>   Medication Order Taking? Sig Documenting Provider Last Dose Status Informant  amLODipine (NORVASC) 10 MG tablet [id number] Yes TAKE ONE-HALF TABLET BY MOUTH  DAILY Croitoru, Mihai, MD Taking Active   aspirin 81 MG tablet [id number] Yes Take 81 mg by mouth daily. [provider] Taking Active Spouse/Significant Other           Med Note Reinaldo Raddle, MADELINE G   Tue Mar 16, 2022  4:10 PM)    insulin glargine (LANTUS) 100 UNIT/ML injection [id number] Yes Inject 20 Units into the skin daily. [provider] Taking Active   metoprolol succinate (TOPROL XL) 25 MG 24 hr tablet [id number] Yes Take 0.5 tablets (12.5 mg total) by mouth daily. Croitoru, Mihai, MD Taking Active   nitroGLYCERIN (NITROSTAT) 0.4 MG SL tablet [id number]  Place 0.4 mg under the tongue every 5 (five) minutes as needed  for chest pain.  Patient not taking: Reported on 12/29/2022   [provider]  Active            Med Note Mickle Plumb Dec 29, 2022 10:56 AM) Patient has if needed  [facility] ULTRA test strip [id number]  CHECK BLOOD SUGAR 3 TIMES A DAY OR AS DIRECTED [name], [name], MD  Active   pantoprazole (PROTONIX) 40 MG tablet  [id number] Yes Take 1 tablet (40 mg total) by mouth daily. Appointment needed for further refills. [name], [name], MD Taking Active   rosuvastatin (CRESTOR) 20 MG tablet [id number] Yes Take 1 tablet (20 mg total) by mouth daily. Croitoru, Mihai, MD Taking Active   traZODone (DESYREL) 50 MG tablet [id number] No Take 0.5 tablets (25 mg total) by mouth at bedtime.  Patient not taking: Reported on 05/10/2023   [name], [name], MD Not Taking Active            Med Note Marcelina Morel Jan 07, 2022  2:20 PM) Patient would like to talk with provider before taking  ULTICARE MINI PEN NEEDLES 31G X 6 MM MISC [id number]  USE AS DIRECTED [name], [name], MD  Active               Assessment/Plan:   Diabetes: - Currently uncontrolled - Reviewed long term cardiovascular and renal outcomes of uncontrolled blood sugar - Reviewed goal A1c, goal fasting, and goal 2 hour post prandial glucose - Reviewed dietary modifications including low carb diet - Recommend to increase lantus to 22 units in morning and 4 units at night as previously instructed by Dr. Dalbert Garnet  - Recommend to check glucose twice daily    Hypertension: - Currently uncontrolled - Reviewed long term cardiovascular and renal outcomes of uncontrolled blood pressure - Reviewed appropriate blood pressure monitoring technique and reviewed goal blood pressure. Recommended to check home blood pressure and heart rate 2-3x/week - Recommend to continue current medication therapy. If seeing elevated readings >140/90 at home, please contact us     Follow Up Plan: 4 months  Sherrill Raring, PharmD Clinical Pharmacist [phone]

## 2023-05-11 DIAGNOSIS — E785 Hyperlipidemia, unspecified: Secondary | ICD-10-CM | POA: Diagnosis not present

## 2023-05-11 DIAGNOSIS — E1122 Type 2 diabetes mellitus with diabetic chronic kidney disease: Secondary | ICD-10-CM | POA: Diagnosis not present

## 2023-05-11 DIAGNOSIS — I129 Hypertensive chronic kidney disease with stage 1 through stage 4 chronic kidney disease, or unspecified chronic kidney disease: Secondary | ICD-10-CM | POA: Diagnosis not present

## 2023-05-11 DIAGNOSIS — I2510 Atherosclerotic heart disease of native coronary artery without angina pectoris: Secondary | ICD-10-CM | POA: Diagnosis not present

## 2023-05-11 DIAGNOSIS — E1165 Type 2 diabetes mellitus with hyperglycemia: Secondary | ICD-10-CM | POA: Diagnosis not present

## 2023-05-13 ENCOUNTER — Ambulatory Visit: Payer: Medicare Other

## 2023-05-23 ENCOUNTER — Ambulatory Visit: Payer: Medicare Other | Admitting: Family Medicine

## 2023-05-23 ENCOUNTER — Encounter: Payer: Self-pay | Admitting: Family Medicine

## 2023-05-23 ENCOUNTER — Ambulatory Visit (INDEPENDENT_AMBULATORY_CARE_PROVIDER_SITE_OTHER): Payer: Medicare Other | Admitting: Family Medicine

## 2023-05-23 VITALS — BP 128/80 | HR 60 | Temp 97.6°F | Resp 16 | Ht 69.0 in | Wt 171.4 lb

## 2023-05-23 DIAGNOSIS — L299 Pruritus, unspecified: Secondary | ICD-10-CM

## 2023-05-23 DIAGNOSIS — Z794 Long term (current) use of insulin: Secondary | ICD-10-CM

## 2023-05-23 DIAGNOSIS — F419 Anxiety disorder, unspecified: Secondary | ICD-10-CM | POA: Diagnosis not present

## 2023-05-23 DIAGNOSIS — L821 Other seborrheic keratosis: Secondary | ICD-10-CM

## 2023-05-23 DIAGNOSIS — Z23 Encounter for immunization: Secondary | ICD-10-CM | POA: Diagnosis not present

## 2023-05-23 DIAGNOSIS — E1140 Type 2 diabetes mellitus with diabetic neuropathy, unspecified: Secondary | ICD-10-CM | POA: Diagnosis not present

## 2023-05-23 MED ORDER — SERTRALINE HCL 25 MG PO TABS
25.0000 mg | ORAL_TABLET | Freq: Every day | ORAL | 1 refills | Status: DC
Start: 2023-05-23 — End: 2023-07-22

## 2023-05-23 NOTE — Patient Instructions (Addendum)
A few things to remember from today's visit:  Pruritus of skin  Need for influenza vaccination - Plan: Flu Vaccine Trivalent High Dose (Fluad)  Seborrheic keratosis  Anxiety disorder, unspecified type - Plan: sertraline (ZOLOFT) 25 MG tablet Today we started Sertraline, this type of medications can increase suicidal risk. This is more prevalent among children,adolecents, and young adults with major depression or other psychiatric disorders. It can also make depression worse. Most common side effects are gastrointestinal, self limited after a few weeks: diarrhea, nausea, constipation  Or diarrhea among some.  In general it is well tolerated. We will follow closely.  If you need refills for medications you take chronically, please call your pharmacy. Do not use My Chart to request refills or for acute issues that need immediate attention. If you send a my chart message, it may take a few days to be addressed, specially if I am not in the office.  Please be sure medication list is accurate. If a new problem present, please set up appointment sooner than planned today.  For the skin add a daily moisturizer, Eucerin or Cetaphil 3 times per day on skin.

## 2023-05-23 NOTE — Progress Notes (Signed)
ACUTE VISIT Chief Complaint  Patient presents with   itchy spots    On shoulders and back, ongoing since the Spring    HPI: [name] is a 77 y.o. male with a PMHx significant for DM II, HTN, depression, HLD, GERD, CAD, and atrial fibrillation, among others, who is here today with his wife complaining of skin pruritus and skin lesions on shoulders and back.   He says he has had "spots" on his shoulders and upper back since the spring this year.  He saw dermatology on 01/11/2023, Dr Yetta Barre. States that he was prescribed a "cream" and believes the spots are getting better.              [id number] Metabolic Panel (CMP) (Order Date - 05/04/2023) (Collection Date & Time - 05/04/2023 12:32 PM)           Value Reference Range          Glucose 226 H 70-99 - mg/dL          BUN 12   2-95 - mg/dL          Creatinine 1.88   0.76-1.27 - mg/dL          eGFR 71   >41 - mL/min/1.73          BUN/Creatinine Ratio 11   10-24 -          Sodium 137   134-144 - mmol/L          Potassium 4.2   3.5-5.2 - mmol/L          Chloride 100   96-106 - mmol/L          Carbon Dioxide, Total 24   20-29 - mmol/L          Calcium 9.1   8.6-10.2 - mg/dL          Protein, Total 6.7   6.0-8.5 - g/dL          Albumin 4.4   3.8-4.8 - g/dL          Globulin, Total 2.3   1.5-4.5 - g/dL          Bilirubin, Total 0.6   0.0-1.2 - mg/dL          Alkaline Phosphatase 100   44-121 - IU/L          AST (SGOT) 39   0-40 - IU/L          ALT (SGPT)      Anxiety/shakiness: He mentions he is still having episodes of "shakiness" intermittently for a couple days at the time. He has not identified exacerbating or alleviating factors. He feels "nervous inside."  He has had this problem for a few years. He has taken Celexa in the past.  Denies depressed mood.  He also complains of chronic fatigue. He says he frequently wakes up to use the bathroom at night.  He denies any sleep apnea.  He tried trazodone in the past, it  did not help.    05/23/2023   12:02 PM 03/11/2022    2:16 PM 12/28/2021    3:02 PM 02/04/2021    9:33 AM 10/13/2020    1:43 PM  Depression screen PHQ 2/9  Decreased Interest 0 0 0 0 0  Down, Depressed, Hopeless 0 0 0 0 0  PHQ - 2 Score 0 0 0 0 0  Altered sleeping 0      Tired, decreased energy 1  Change in appetite 0      Feeling bad or failure about yourself  0      Trouble concentrating 0      Moving slowly or fidgety/restless 0      Suicidal thoughts 0      PHQ-9 Score 1      Difficult doing work/chores Not difficult at all          05/23/2023   12:02 PM 08/08/2018    5:20 PM  GAD 7 : Generalized Anxiety Score  Nervous, Anxious, on Edge 1 3  Control/stop worrying 0 0  Worry too much - different things 0 2  Trouble relaxing 0 0  Restless 1 0  Easily annoyed or irritable 1 3  Afraid - awful might happen 0 0  Total GAD 7 Score 3 8  Anxiety Difficulty Not difficult at all Somewhat difficult   He follows regularly with cardiology for CAD, endocrinology for DM II, and in 11/2022 ENT for eustachian tube dysfunction s/p ear tube placement.   Reports that recent HgA1C was 8.4, his Lantus was adjusted.    ZOX:[id number], Random Urine Sample (Order Date - 05/04/2023) (Collection Date & Time - 05/04/2023 12:32 PM)           Value Reference Range          Creatinine, Urine 250.7   Not Estab. - mg/dL          Albumin, Urine 23.2   Not Estab. - ug/mL          Alb/Creat Ratio 9   0-29 - mg/g creat   Review of Systems  Constitutional:  Negative for activity change, appetite change, chills and fever.  Respiratory:  Negative for cough, shortness of breath and wheezing.   Cardiovascular:  Negative for chest pain and leg swelling.  Gastrointestinal:  Negative for abdominal pain, nausea and vomiting.  Genitourinary:  Negative for decreased urine volume, dysuria and hematuria.  Skin:  Negative for wound.  Neurological:  Negative for syncope, facial asymmetry and headaches.   Psychiatric/Behavioral:  Positive for sleep disturbance. Negative for confusion and hallucinations. The patient is nervous/anxious.   See other pertinent positives and negatives in HPI.  Current Outpatient Medications on File Prior to Visit  Medication Sig Dispense Refill   amLODipine (NORVASC) 10 MG tablet TAKE ONE-HALF TABLET BY MOUTH  DAILY 45 tablet 2   aspirin 81 MG tablet Take 81 mg by mouth daily.     insulin glargine (LANTUS) 100 UNIT/ML injection Inject 20 Units into the skin daily.     metoprolol succinate (TOPROL XL) 25 MG 24 hr tablet Take 0.5 tablets (12.5 mg total) by mouth daily. 45 tablet 3   nitroGLYCERIN (NITROSTAT) 0.4 MG SL tablet Place 0.4 mg under the tongue every 5 (five) minutes as needed for chest pain.     ONETOUCH ULTRA test strip CHECK BLOOD SUGAR 3 TIMES A DAY OR AS DIRECTED 100 strip 0   pantoprazole (PROTONIX) 40 MG tablet Take 1 tablet (40 mg total) by mouth daily. Appointment needed for further refills. 100 tablet 0   rosuvastatin (CRESTOR) 20 MG tablet Take 1 tablet (20 mg total) by mouth daily. 90 tablet 3   ULTICARE MINI PEN NEEDLES 31G X 6 MM MISC USE AS DIRECTED 100 each 1   No current facility-administered medications on file prior to visit.    Past Medical History:  Diagnosis Date   Anxiety    Arthritis    "back" (04/05/2014)  Atrial fibrillation (HCC)    Blood in stool    Bradycardia- with decrease of BB some improvement 09/10/2017   CAD (coronary artery disease)    CAD s/p CABG 2006 08/16/2013   a. [name] - to LAD, SVG to diagonal, SVG to OM, SVG to PDA ;  b.  LHC (9/15): ostial LAD occl, ostial CFX 60%, prox AVCFX 95%, RCA 95%, S-PDA occl, S-OM/Dx with Dx limb occl and prox 60%, L-LAD patent, EF 35-40% wtih ant-lat and apical HK-AK >> PCI:  BMS to native RCA    Diverticulosis    Dizziness    DM type 2 causing complication (HCC) 08/16/2013   GERD (gastroesophageal reflux disease)    Hypertension    Iliac artery aneurysm (HCC)    Ischemic  cardiomyopathy    a. EF 35-40% by LV gram 03/2014   Joint pain    Kidney stones    Kidney stones    Lower back pain Jan 2015   Mixed hyperlipidemia 08/16/2013   Myocardial infarction (HCC) 03/2014 X 2?   S/P angioplasty with stent DES mid SVG to OM and DES to prox. SVG to OM, DAPT for lifetime 09/10/2017   Type II diabetes mellitus (HCC)    Allergies  Allergen Reactions   Codeine Nausea Only and Other (See Comments)    Cannot take on empty stomach.   Valium Nausea Only and Other (See Comments)    Cannot take on empty stomach.   Empagliflozin Other (See Comments)   Simvastatin Other (See Comments)   Sitagliptin Other (See Comments)    Social History   Socioeconomic History   Marital status: Married    Spouse name: Not on file   Number of children: 3   Years of education: Not on file   Highest education level: Not on file  Occupational History   Not on file  Tobacco Use   Smoking status: Former    Types: Cigars    Quit date: 09/09/2017    Years since quitting: 5.7   Smokeless tobacco: Never   Tobacco comments:    states he smoked cigar 1-2 per year.    01/07/2022 patient stated he smoked 1 cigarette before coming to appointment  Vaping Use   Vaping status: Never Used  Substance and Sexual Activity   Alcohol use: No   Drug use: No   Sexual activity: Yes  Other Topics Concern   Not on file  Social History Narrative   Lives with wife   Caffeine use: Soda sometimes   1/2 cup coffee in the morning sometimes   Right handed    Social Determinants of Health   Financial Resource Strain: Low Risk  (08/24/2022)   Overall Financial Resource Strain (CARDIA)    Difficulty of Paying Living Expenses: Not very hard  Food Insecurity: Low Risk  (11/12/2022)   [facility]   Hunger Vital Sign    Worried About Running Out of Food in the Last Year: Never true    Ran Out of Food in the Last Year: Never true  Transportation Needs: No Transportation Needs  (11/12/2022)   [facility]   Transportation    In the past 12 months, has lack of reliable transportation kept you from medical appointments, meetings, work or from getting things needed for daily living? : No  Physical Activity: Sufficiently Active (03/11/2022)   Exercise Vital Sign    Days of Exercise per Week: 6 days  Minutes of Exercise per Session: 150+ min  Stress: No Stress Concern Present (03/11/2022)   [employer] of Occupational Health - Occupational Stress Questionnaire    Feeling of Stress : Not at all  Social Connections: Socially Integrated (03/11/2022)   Social Connection and Isolation Panel [NHANES]    Frequency of Communication with Friends and Family: More than three times a week    Frequency of Social Gatherings with Friends and Family: More than three times a week    Attends Religious Services: More than 4 times per year    Active Member of [employer] or Organizations: Yes    Attends Engineer, structural: More than 4 times per year    Marital Status: Married    Vitals:   05/23/23 1144  BP: 128/80  Pulse: 60  Resp: 16  Temp: 97.6 F (36.4 C)  SpO2: 97%   Body mass index is 25.31 kg/m.  Physical Exam Nursing note reviewed.  Constitutional:      General: He is not in acute distress.    Appearance: He is well-developed.  HENT:     Head: Normocephalic and atraumatic.  Eyes:     Conjunctiva/sclera: Conjunctivae normal.  Cardiovascular:     Rate and Rhythm: Normal rate and regular rhythm.     Heart sounds: No murmur heard. Pulmonary:     Effort: Pulmonary effort is normal. No respiratory distress.     Breath sounds: Normal breath sounds.  Abdominal:     Palpations: Abdomen is soft. There is no hepatomegaly or mass.     Tenderness: There is no abdominal tenderness.  Lymphadenopathy:     Cervical: No cervical adenopathy.  Skin:    General: Skin is warm.     Findings: No erythema or rash.     Comments: Sebaceous  cyst upper left sided back, 2 cm, and SK scattered on upper and mid back. No suspicious lesions.  Neurological:     Mental Status: He is alert and oriented to person, place, and time.     Cranial Nerves: No cranial nerve deficit.     Gait: Gait normal.  Psychiatric:        Mood and Affect: Mood is anxious.   ASSESSMENT AND PLAN:  Mr. Stillson was seen today for itching lesions on his back and shoulders.   Pruritus of skin We discussed possible etiologies. He reports improvement with topical treatment recommended by his dermatologist. Recommend adding a daily moisturizer, Eucerin or Cetaphil a couple times per day and as needed. LFT's wnl in 04/2023. Continue following with dermatologist.  Seborrheic keratosis We discussed Dx and prognosis. This could be contributing to skin pruritus.  Anxiety disorder, unspecified type Assessment & Plan: Shaking episode she is describing could be related with this problem. He has tried Celexa and trazodone in the past, ineffective. Today he agrees with trying sertraline low-dose, 25 mg daily.  We discussed some side effects. Follow-up in 8 weeks, before if needed.  Orders: -     Sertraline HCl; Take 1 tablet (25 mg total) by mouth daily.  Dispense: 30 tablet; Refill: 1  Type 2 diabetes mellitus with diabetic neuropathy, with long-term current use of insulin (HCC) Assessment & Plan: Problem is not well controlled,last HgA1C 05/11/23 was 8.4. Follows with endocrinologist.  Need for influenza vaccination -     Flu Vaccine Trivalent High Dose (Fluad)  Return in about 2 months (around 07/23/2023) for chronic problems.  Trula Ore, acting as a Neurosurgeon for Elleanna Melling [name],  MD., have documented all relevant documentation on the behalf of Iziah Cates [name], MD, as directed by  Leatta Alewine [name], MD while in the presence of Nyoka Alcoser [name], MD.   I, Seirra Kos [name], MD, have reviewed all documentation for this visit. The documentation on 05/23/23 for the exam,  diagnosis, procedures, and orders are all accurate and complete.  Kyrene Longan G. [name], MD  [facility]. [place] office.

## 2023-05-23 NOTE — Assessment & Plan Note (Signed)
Shaking episode she is describing could be related with this problem. He has tried Celexa and trazodone in the past, ineffective. Today he agrees with trying sertraline low-dose, 25 mg daily.  We discussed some side effects. Follow-up in 8 weeks, before if needed.

## 2023-05-23 NOTE — Assessment & Plan Note (Addendum)
Problem is not well controlled,last HgA1C 05/11/23 was 8.4. Follows with endocrinologist.

## 2023-05-30 ENCOUNTER — Other Ambulatory Visit: Payer: Self-pay | Admitting: Cardiovascular Disease

## 2023-06-14 DIAGNOSIS — H90A32 Mixed conductive and sensorineural hearing loss, unilateral, left ear with restricted hearing on the contralateral side: Secondary | ICD-10-CM | POA: Diagnosis not present

## 2023-06-14 DIAGNOSIS — H6993 Unspecified Eustachian tube disorder, bilateral: Secondary | ICD-10-CM | POA: Diagnosis not present

## 2023-06-22 ENCOUNTER — Telehealth: Payer: Self-pay | Admitting: Cardiovascular Disease

## 2023-06-22 MED ORDER — ROSUVASTATIN CALCIUM 20 MG PO TABS: 20.0000 mg | ORAL_TABLET | Freq: Every day | ORAL | 2 refills | Status: DC

## 2023-06-22 NOTE — Telephone Encounter (Signed)
*  STAT* If patient is at the pharmacy, call can be transferred to refill team.   1. Which medications need to be refilled? (please list name of each medication and dose if known) rosuvastatin (CRESTOR) 20 MG tablet   2. Which pharmacy/location (including street and city if local pharmacy) is medication to be sent to? [facility] [place], [place] - [address] [place] Phone: [phone]  Fax: [phone]     3. Do they need a 30 day or 90 day supply? 90

## 2023-07-11 ENCOUNTER — Telehealth: Payer: Self-pay | Admitting: *Deleted

## 2023-07-11 NOTE — Telephone Encounter (Signed)
A message was left on the patient's wife's chart to call as he had questions.  I spoke with the patient and he stated he has had body aches and "quivers" for the past week and had not checked his temperature.  Patient was informed to visit an urgent care for evaluation as we do not have any openings at our office and PCP is not available until next week.  Mrs Dieleman stated the patient has had pain in his body for some time and the PCP is aware.  Appt scheduled for 1/7.

## 2023-07-17 ENCOUNTER — Other Ambulatory Visit: Payer: Self-pay | Admitting: Family Medicine

## 2023-07-19 ENCOUNTER — Ambulatory Visit: Payer: Medicare Other | Admitting: Family Medicine

## 2023-07-19 ENCOUNTER — Encounter: Payer: Self-pay | Admitting: Family Medicine

## 2023-07-19 VITALS — BP 120/74 | HR 70 | Resp 16 | Ht 69.0 in | Wt 167.5 lb

## 2023-07-19 DIAGNOSIS — Z794 Long term (current) use of insulin: Secondary | ICD-10-CM

## 2023-07-19 DIAGNOSIS — M791 Myalgia, unspecified site: Secondary | ICD-10-CM

## 2023-07-19 DIAGNOSIS — F419 Anxiety disorder, unspecified: Secondary | ICD-10-CM

## 2023-07-19 DIAGNOSIS — R251 Tremor, unspecified: Secondary | ICD-10-CM

## 2023-07-19 DIAGNOSIS — E1140 Type 2 diabetes mellitus with diabetic neuropathy, unspecified: Secondary | ICD-10-CM

## 2023-07-19 LAB — HEPATIC FUNCTION PANEL
ALT: 12 U/L (ref 0–53)
AST: 35 U/L (ref 0–37)
Albumin: 4.4 g/dL (ref 3.5–5.2)
Alkaline Phosphatase: 103 U/L (ref 39–117)
Bilirubin, Direct: 0.2 mg/dL (ref 0.0–0.3)
Total Bilirubin: 0.9 mg/dL (ref 0.2–1.2)
Total Protein: 6.8 g/dL (ref 6.0–8.3)

## 2023-07-19 LAB — CBC WITH DIFFERENTIAL/PLATELET
Basophils Absolute: 0.1 10*3/uL (ref 0.0–0.1)
Basophils Relative: 0.8 % (ref 0.0–3.0)
Eosinophils Absolute: 0.1 10*3/uL (ref 0.0–0.7)
Eosinophils Relative: 2 % (ref 0.0–5.0)
HCT: 45.5 % (ref 39.0–52.0)
Hemoglobin: 15.2 g/dL (ref 13.0–17.0)
Lymphocytes Relative: 26.8 % (ref 12.0–46.0)
Lymphs Abs: 2 10*3/uL (ref 0.7–4.0)
MCHC: 33.4 g/dL (ref 30.0–36.0)
MCV: 85.9 fL (ref 78.0–100.0)
Monocytes Absolute: 0.6 10*3/uL (ref 0.1–1.0)
Monocytes Relative: 7.7 % (ref 3.0–12.0)
Neutro Abs: 4.7 10*3/uL (ref 1.4–7.7)
Neutrophils Relative %: 62.7 % (ref 43.0–77.0)
Platelets: 248 10*3/uL (ref 150.0–400.0)
RBC: 5.3 Mil/uL (ref 4.22–5.81)
RDW: 13.4 % (ref 11.5–15.5)
WBC: 7.5 10*3/uL (ref 4.0–10.5)

## 2023-07-19 LAB — BASIC METABOLIC PANEL
BUN: 16 mg/dL (ref 6–23)
CO2: 29 meq/L (ref 19–32)
Calcium: 9.3 mg/dL (ref 8.4–10.5)
Chloride: 100 meq/L (ref 96–112)
Creatinine, Ser: 1.15 mg/dL (ref 0.40–1.50)
GFR: 61.39 mL/min (ref >60.00)
Glucose, Bld: 244 mg/dL — ABNORMAL HIGH (ref 70–99)
Potassium: 4.3 meq/L (ref 3.5–5.1)
Sodium: 137 meq/L (ref 135–145)

## 2023-07-19 LAB — TSH: TSH: 2.14 u[IU]/mL (ref 0.35–5.50)

## 2023-07-19 LAB — SEDIMENTATION RATE: Sed Rate: 17 mm/h (ref 0–20)

## 2023-07-19 LAB — C-REACTIVE PROTEIN: CRP: <1 mg/dL (ref 0.5–20.0)

## 2023-07-19 LAB — CK: Total CK: 66 U/L (ref 7–232)

## 2023-07-19 LAB — VITAMIN B12: Vitamin B-12: 212 pg/mL (ref 211–911)

## 2023-07-19 NOTE — Progress Notes (Signed)
 ACUTE VISIT Chief Complaint  Patient presents with   Joint Pain   HPI: [name] is a 78 y.o. male with a PMHx significant for DM II, HTN, depression, HLD, GERD, CAD, and atrial fibrillation, among others, who is here today with his wife complaining of body aches.   [name] was last seen on 05/23/23, since then [name] has seen ENT.  Body aches:  Patient complains of new body aches that started around Christmas. Initially affecting the back of his legs, and then moved to his shoulders.  [name] used a heating pad at night for 2 nights, which helped while the pain was in his legs.  Pertinent negatives include rhinorrhea, nasal congestion, sore throat, fever, chills, rash, new medications, or unusual physical activity.  No limitation of ROM.  Tremors/anxiety:  Patient also complains of occasional tremors in his hands that have been worsening throughout the last year.  [name] endorses occasional difficulty writing due to hand shaking.   [name] also mentions [name] occasionally has tremors throughout and inside his body, and sometimes has difficulty speaking when this happens.  [name] saw neurology for this in 03/2018, and was started on Lexapro . [name] has also discussed this problem with his cardiology. Problem started after heart procedure, angioplasty with stent placement in 09/2017, s/p CABG in 2006. [name] has also tried Prozac and Propranolol .  At his last visit, [name] was started on Sertraline  25 mg daily. [name] took it three times, but stopped because [name] had stomach aches that woke him up every night [name] took it.  [name] denies any alcohol intake.   [name] says his sleep has improved since his last visit.   -[name] mentions that his BS's have been elevated and his HgA1C is not at goal. [name] is following with endocrinologist. Last HgA1C in 04/2023 was 8.4.  Review of Systems  Constitutional:  Negative for activity change, appetite change, chills and fever.  HENT:  Negative for nosebleeds and sore throat.   Respiratory:   Negative for cough and wheezing.   Cardiovascular:  Negative for chest pain and palpitations.  Gastrointestinal:  Negative for abdominal pain, nausea and vomiting.  Endocrine: Negative for cold intolerance and heat intolerance.  Genitourinary:  Negative for decreased urine volume, dysuria and hematuria.  Musculoskeletal:  Positive for arthralgias.  Skin:  Negative for rash.  Neurological:  Negative for syncope and facial asymmetry.  Psychiatric/Behavioral:  Negative for confusion and hallucinations. The patient is nervous/anxious.   See other pertinent positives and negatives in HPI.  Current Outpatient Medications on File Prior to Visit  Medication Sig Dispense Refill   amLODipine  (NORVASC ) 10 MG tablet TAKE ONE-HALF TABLET BY MOUTH  DAILY 45 tablet 2   aspirin  81 MG tablet Take 81 mg by mouth daily.     insulin  glargine (LANTUS ) 100 UNIT/ML injection Inject 20 Units into the skin daily.     metoprolol  succinate (TOPROL  XL) 25 MG 24 hr tablet Take 0.5 tablets (12.5 mg total) by mouth daily. 45 tablet 3   nitroGLYCERIN  (NITROSTAT ) 0.4 MG SL tablet Place 0.4 mg under the tongue every 5 (five) minutes as needed for chest pain.     ONETOUCH ULTRA test strip CHECK BLOOD SUGAR 3 TIMES A DAY OR AS DIRECTED 100 strip 0   pantoprazole  (PROTONIX ) 40 MG tablet TAKE 1 TABLET BY MOUTH DAILY 100 tablet 2   rosuvastatin  (CRESTOR ) 20 MG tablet Take 1 tablet (20 mg total) by mouth daily. 90 tablet 2   sertraline  (ZOLOFT ) 25 MG  tablet Take 1 tablet (25 mg total) by mouth daily. 30 tablet 1   ULTIC[employer] MINI PEN NEEDLES 31G X 6 MM MISC USE AS DIRECTED 100 each 1   No current facility-administered medications on file prior to visit.   Past Medical History:  Diagnosis Date   Anxiety    Arthritis    back (04/05/2014)   Atrial fibrillation (HCC)    Blood in stool    Bradycardia- with decrease of BB some improvement 09/10/2017   CAD (coronary artery disease)    CAD s/p CABG 2006 08/16/2013   a. Dr Obadiah -  to LAD, SVG to diagonal, SVG to OM, SVG to PDA ;  b.  [facility] (9/15): ostial LAD occl, ostial CFX 60%, prox AVCFX 95%, RCA 95%, S-PDA occl, S-OM/Dx with Dx limb occl and prox 60%, L-LAD patent, EF 35-40% wtih ant-lat and apical HK-AK >> PCI:  BMS to native RCA    Diverticulosis    Dizziness    DM type 2 causing complication (HCC) 08/16/2013   GERD (gastroesophageal reflux disease)    Hypertension    Iliac artery aneurysm (HCC)    Ischemic cardiomyopathy    a. EF 35-40% by LV gram 03/2014   Joint pain    Kidney stones    Kidney stones    Lower back pain Jan 2015   Mixed hyperlipidemia 08/16/2013   Myocardial infarction (HCC) 03/2014 X 2?   S/P angioplasty with stent DES mid SVG to OM and DES to prox. SVG to OM, DAPT for lifetime 09/10/2017   Type II diabetes mellitus (HCC)    Allergies  Allergen Reactions   Codeine Nausea Only and Other (See Comments)    Cannot take on empty stomach.   Valium Nausea Only and Other (See Comments)    Cannot take on empty stomach.   Empagliflozin  Other (See Comments)   Simvastatin  Other (See Comments)   Sitagliptin Other (See Comments)    Social History   Socioeconomic History   Marital status: Married    Spouse name: Not on file   Number of children: 3   Years of education: Not on file   Highest education level: Not on file  Occupational History   Not on file  Tobacco Use   Smoking status: Former    Types: Cigars    Quit date: 09/09/2017    Years since quitting: 5.8   Smokeless tobacco: Never   Tobacco comments:    states [name] smoked cigar 1-2 per year.    01/07/2022 patient stated [name] smoked 1 cigarette before coming to appointment  Vaping Use   Vaping status: Never Used  Substance and Sexual Activity   Alcohol use: No   Drug use: No   Sexual activity: Yes  Other Topics Concern   Not on file  Social History Narrative   Lives with wife   Caffeine use: Soda sometimes   1/2 cup coffee in the morning sometimes   Right handed    Social Drivers of  Health   Financial Resource Strain: Low Risk  (08/24/2022)   Overall Financial Resource Strain (CARDIA)    Difficulty of Paying Living Expenses: Not very hard  Food Insecurity: Low Risk  (11/12/2022)   [facility]   Hunger Vital Sign    Worried About Running Out of Food in the Last Year: Never true    Ran Out of Food in the Last Year: Never true  Transportation Needs: No Transportation Needs (11/12/2022)  [facility]   Transportation    In the past 12 months, has lack of reliable transportation kept you from medical appointments, meetings, work or from getting things needed for daily living? : No  Physical Activity: Sufficiently Active (03/11/2022)   Exercise Vital Sign    Days of Exercise per Week: 6 days    Minutes of Exercise per Session: 150+ min  Stress: No Stress Concern Present (03/11/2022)   [employer] of Occupational Health - Occupational Stress Questionnaire    Feeling of Stress : Not at all  Social Connections: Socially Integrated (03/11/2022)   Social Connection and Isolation Panel [NHANES]    Frequency of Communication with Friends and Family: More than three times a week    Frequency of Social Gatherings with Friends and Family: More than three times a week    Attends Religious Services: More than 4 times per year    Active Member of [employer] or Organizations: Yes    Attends Banker Meetings: More than 4 times per year    Marital Status: Married   Vitals:   07/19/23 1300  BP: 120/74  Pulse: 70  Resp: 16  SpO2: 97%   Body mass index is 24.74 kg/m.  Physical Exam Vitals and nursing note reviewed.  Constitutional:      General: [name] is not in acute distress.    Appearance: [name] is well-developed.  HENT:     Head: Normocephalic and atraumatic.     Mouth/Throat:     Mouth: Mucous membranes are moist.     Pharynx: Oropharynx is clear. Uvula midline.  Eyes:     Conjunctiva/sclera:  Conjunctivae normal.  Cardiovascular:     Rate and Rhythm: Normal rate and regular rhythm.     Heart sounds: No murmur heard. Pulmonary:     Effort: Pulmonary effort is normal. No respiratory distress.     Breath sounds: Normal breath sounds.  Abdominal:     Palpations: Abdomen is soft. There is no mass.     Tenderness: There is no abdominal tenderness.  Musculoskeletal:     Right lower leg: No edema.     Left lower leg: No edema.     Comments: Mild limitation of shoulder abduction.  No tenderness with palpation or signs of synovitis.  Lymphadenopathy:     Cervical: No cervical adenopathy.  Skin:    General: Skin is warm.     Findings: No erythema or rash.  Neurological:     Mental Status: [name] is alert and oriented to person, place, and time.     Cranial Nerves: No cranial nerve deficit.     Motor: Tremor (hand tremor, R>L, not present at rest.) present.     Comments: Antalgic and mildly unstable gait, not assisted.  Psychiatric:        Mood and Affect: Affect normal. Mood is anxious.   ASSESSMENT AND PLAN:  [name] was seen today for body aches.  Lab Results  Component Value Date   NA 137 07/19/2023   CL 100 07/19/2023   K 4.3 07/19/2023   CO2 29 07/19/2023   BUN 16 07/19/2023   CREATININE 1.15 07/19/2023   GFR 61.39 07/19/2023   CALCIUM  9.3 07/19/2023   ALBUMIN 4.4 07/19/2023   GLUCOSE 244 (H) 07/19/2023   Lab Results  Component Value Date   ALT 12 07/19/2023   AST 35 07/19/2023   ALKPHOS 103 07/19/2023   BILITOT 0.9 07/19/2023   Lab Results  Component  Value Date   TSH 2.14 07/19/2023   Lab Results  Component Value Date   VITAMINB12 212 07/19/2023   Lab Results  Component Value Date   WBC 7.5 07/19/2023   HGB 15.2 07/19/2023   HCT 45.5 07/19/2023   MCV 85.9 07/19/2023   PLT 248.0 07/19/2023   Lab Results  Component Value Date   CRP <1.0 07/19/2023   Lab Results  Component Value Date   CKTOTAL 66 07/19/2023   TROPONINI <0.30 02/02/2014    Myalgia And arthralgias, upper and lower extremities. We discussed possible etiologies, including OA, med side effects,and inflammatory process. [name] feels like it is better today. Tylenol  500 mg 2-3 times daily if needed. Monitor for new symptoms. Further recommendations according to lab results.  -     Basic metabolic panel; Future -     Hepatic function panel; Future -     TSH; Future -     C-reactive protein; Future -     Sedimentation rate; Future  Tremor of both hands [name] described problem as shaking inside his body and hand tremor, intermittent since 09/2017. [name] ahs seen neurologist for this problem and other issues, including memory difficulties, which [name] does not mention today. [name] has also addressed this problem with cardiologist.  -     Basic metabolic panel; Future -     Hepatic function panel; Future -     TSH; Future -     Vitamin B12; Future -     CBC with Differential/Platelet; Future -     CK; Future -     C-reactive protein; Future -     Sedimentation rate; Future  Anxiety disorder, unspecified type This could be a contributing factor for some of his symptoms. [name] has tried Lexapro ,fluoxetine,and recently Sertraline  but has not tolerated medications well, [name] did not want to try taking medications longer, explained that most of the time side effects resolved in a few weeks. [name] would like blood work done today and hold on medication for now.  Type 2 diabetes mellitus with diabetic neuropathy, with long-term current use of insulin  (HCC) Problem is not well controlled. Following with endocrinologist, appt in a few days.  Return if symptoms worsen or fail to improve, for keep next appointment.  I, Leonce PARAS Wierda, acting as a scribe for Draeden Kellman, MD., have documented all relevant documentation on the behalf of Ajee Heasley, MD, as directed by  Loi Rennaker, MD while in the presence of Xian Alves, MD.   I, Cameron Schwinn, MD, have reviewed all documentation  for this visit. The documentation on 07/19/23 for the exam, diagnosis, procedures, and orders are all accurate and complete.  Bleu Minerd G. Bladyn Tipps, MD  [facility]. [place] office.

## 2023-07-19 NOTE — Patient Instructions (Addendum)
 A few things to remember from today's visit:  Tremor of both hands - Plan: Basic metabolic panel, Hepatic function panel, TSH, Vitamin B12, CBC with Differential/Platelet, CK, C-reactive protein, Sedimentation rate  Anxiety disorder, unspecified type  Type 2 diabetes mellitus with diabetic neuropathy, with long-term current use of insulin  (HCC)  Myalgia - Plan: Basic metabolic panel, Hepatic function panel, TSH, C-reactive protein, Sedimentation rate  Options if labs are otherwise normal: Celexa  low dose for anxiety and/or neuro referral.  If you need refills for medications you take chronically, please call your pharmacy. Do not use My Chart to request refills or for acute issues that need immediate attention. If you send a my chart message, it may take a few days to be addressed, specially if I am not in the office.  Please be sure medication list is accurate. If a new problem present, please set up appointment sooner than planned today.

## 2023-07-21 ENCOUNTER — Telehealth: Payer: Self-pay | Admitting: Family Medicine

## 2023-07-21 DIAGNOSIS — E1165 Type 2 diabetes mellitus with hyperglycemia: Secondary | ICD-10-CM | POA: Diagnosis not present

## 2023-07-21 NOTE — Telephone Encounter (Signed)
Pt would like blood work result  °

## 2023-07-22 ENCOUNTER — Ambulatory Visit: Payer: Medicare Other | Admitting: Family Medicine

## 2023-07-22 ENCOUNTER — Other Ambulatory Visit: Payer: Self-pay

## 2023-07-22 MED ORDER — CITALOPRAM HYDROBROMIDE 10 MG PO TABS: 10.0000 mg | ORAL_TABLET | Freq: Every day | ORAL | 3 refills | Status: DC

## 2023-07-22 NOTE — Telephone Encounter (Signed)
 See result note.

## 2023-08-24 DIAGNOSIS — E1165 Type 2 diabetes mellitus with hyperglycemia: Secondary | ICD-10-CM | POA: Diagnosis not present

## 2023-08-31 ENCOUNTER — Other Ambulatory Visit: Payer: Self-pay | Admitting: Cardiovascular Disease

## 2023-09-05 ENCOUNTER — Telehealth: Payer: Self-pay

## 2023-09-05 NOTE — Telephone Encounter (Signed)
 Copied from CRM [id number]. Topic: Clinical - Lab/Test Results >> Sep 02, 2023  1:36 PM [place] J wrote: Reason for CRM: Nurse practitioner Randa Evens from [facility] calling in regards to patient's abnormal test results.   Randa Evens reports that on patient's quantaflo test, his left foot was abnormal at 0.53 and right foot at 0.59. If any further questions please contact Dr. Willa Rough.

## 2023-09-08 ENCOUNTER — Telehealth: Payer: Self-pay | Admitting: Cardiovascular Disease

## 2023-09-08 NOTE — Telephone Encounter (Signed)
  Pt c/o of Chest Pain: STAT if active CP, including tightness, pressure, jaw pain, radiating pain to shoulder/upper arm/back, CP unrelieved by Nitro. Symptoms reported of SOB, nausea, vomiting, sweating.  1. Are you having CP right now?   No  2. Are you experiencing any other symptoms (ex. SOB, nausea, vomiting, sweating)?   No  3. Is your CP continuous or coming and going?   Coming and going when he is doing activity such as working in the yard   4. Have you taken Nitroglycerin?   No  5. How long have you been experiencing CP?  Started yesterday    6. If NO CP at time of call then end call with telling Pt to call back or call 911 if Chest pain returns prior to return call from triage team.   Wife Karena Addison) stated patient has also been having burning around his neck, jaw and shoulders.

## 2023-09-08 NOTE — Telephone Encounter (Signed)
 Patient identification verified by 2 forms. Marilynn Rail, RN    Called and spoke to patient  Patient states:   -has burning in shoulders and neck on the right and left side, a bit of jaw pain  -feels like a burning sensations   -he is also feeling burning sensation in his chest as well   -pain started yesterday   -has been out in the year working yesterday   -this usually happens after he is active in the yard   -before yesterday he was not having this pain   -he did not take any tylenol or ibuprofen, symptoms resolved on their own   -had similar symptoms years ago before stent was placed   -symptoms happen again today while doing yard work and resolved  Patient denies:   -SOB/difficulty breathing  Patient scheduled for OV 3/6 at 2:45pm  Reviewed ED warning signs/precautions  Patient verbalized understanding, no questions at this time

## 2023-09-11 NOTE — H&P (View-Only) (Signed)
 Cardiology Office Note    Date:  09/15/2023  ID:  [name], [name] [birth year], MRN [id number] PCP:  [name], [name], MD  Cardiologist:  [name], MD  Electrophysiologist:  None   Chief Complaint: Chest pain   History of Present Illness: .    [name] is a 78 y.o. male with visit-pertinent history of CAD s/p four-vessel bypass surgery in 2006 with LIMA to LAD, SVG x 3 to diagonal, OM and PDA.  In 2015 received a BMS to proximal RCA.  He underwent repeat cardiac catheterization in 08/2017 that showed a long segment of severe stenosis in the SVG to OM, chronically occluded circumflex, patent LIMA to occluded LAD proximal RCA stent with 80% stenosis in the distal branch of the RCA.  On September 09, 2017 he underwent revascularization of the SVG to OM with 2 DES.  Patient was unable to tolerate Brilinta was switched to Plavix.  He has history of EVAR for a AAA and received a stent and a large right common iliac artery aneurysm, monitored by [facility].  Patient also has history of diabetes, hypertension and hyperlipidemia.  Patient was last seen in clinic by [name] on 12/29/2022.  Patient reported 2 or 3 episodes of "burning" in his throat and shoulders that occurred at rest.  It was noted the burning sensation was similar to his previous description of his angina.  He denied experiencing this with physical exertion.  Today patient presents regarding episodes of burning sensation that starts in his throat and travels to bilateral shoulders and into his chest.  Patient reports that last week he was out working in his yard, exerting himself and developed pain that started in his neck and traveled down to his shoulders and into his chest with some associated shortness of breath.  Patient overall felt weak, he stopped his activity and had improvement in symptoms, he did not require sublingual nitroglycerin.  Patient reports that the following day while moving wood in his yard he had a repeat episode, he  did not feel this was as severe however he feels that his symptoms are the same as his prior angina.  Patient reports that in recent weeks he has overall felt more fatigued and feels he has not been able to be as active as he normally is, he is concerned that "his stent is no longer open".  Patient denies palpitations, lower extremity edema, orthopnea or PND.  He denies any presyncope or syncope.  Labwork independently reviewed: 07/19/2023: Sodium 137, potassium 4.3, creatinine 1.15, AST 35, ALT 12, hemoglobin 15.2, hematocrit 45.5 ROS: .   Today he denies lower extremity edema, palpitations, melena, hematuria, hemoptysis, diaphoresis, weakness, presyncope, syncope, orthopnea, and PND.  All other systems are reviewed and otherwise negative. Studies Reviewed: [name]    EKG:  EKG is ordered today, personally reviewed, demonstrating  EKG Interpretation Date/Time:  Thursday September 15 2023 13:07:19 EST Ventricular Rate:  63 PR Interval:  164 QRS Duration:  88 QT Interval:  460 QTC Calculation: 470 R Axis:   -35  Text Interpretation: Normal sinus rhythm Left axis deviation Inferior infarct , age undetermined Possible Anterior infarct , age undetermined When compared with ECG of 29-Dec-2022 10:50, QRS axis Shifted left Confirmed by Reather Littler [phone]) on 09/15/2023 1:53:11 PM   CV Studies:  Cardiac Studies & Procedures   ______________________________________________________________________________________________ CARDIAC CATHETERIZATION  CARDIAC CATHETERIZATION 09/09/2017  Narrative 1. Unstable angina secondary to severe stenosis in the proximal and mid body of the  SVG to the OM 2. Successful PTCA/DES x 1 mid body of SVG to OM 3. Successful PTCA/DES x 1 proximal body of SVG to OM  Recommendations: Continue DAPT for lifetime. Probable discharge home tomorrow.  Findings Coronary Findings Diagnostic  Dominance: Right  No diagnostic findings have been documented. Intervention  No interventions  have been documented.   CARDIAC CATHETERIZATION 09/08/2017  Narrative  Ost RCA to Prox RCA lesion is 20% stenosed.  Mid RCA lesion is 15% stenosed.  2nd RPLB lesion is 80% stenosed.  1st RPLB lesion is 60% stenosed.  Ost Cx lesion is 95% stenosed.  Origin lesion is 50% stenosed.  Dist LM to Ost LAD lesion is 100% stenosed.  Prox Graft lesion is 75% stenosed.  Prox Graft to Mid Graft lesion is 85% stenosed.  Mid Graft to Dist Graft lesion is 95% stenosed.  Origin lesion is 100% stenosed.  Prox Cx to Mid Cx lesion is 100% stenosed.  LV end diastolic pressure is normal.  Severe multivessel native CAD with total ostial occlusion of the LAD, 95% ostial left circumflex stenosis followed by total proximal occlusion; widely patent stent in the ostial to proximal large dominan RCA with mild 20% intimal hyperplasia, irregularity of the mid RCA, 60% stenosis in the PDA and focal 80% stenosis in the PLA branch.  There is faint collateralization to the very distal circumflex from the very distal RCA.  Old occluded vein graft which previously supplied the distal RCA.  Diffusely diseased previous sequential graft which had supplied the diagonal vessel and distal marginal vessel.  The diagonal anastomosis is occluded.  There is ostial eccentric 50% narrowing in the graft followed by 75 and 85% proximal stenoses before a valve.  Beyond the valve.  There is diffuse 95% eccentric stenoses in the graft.  Patent LIMA graft supplying the mid LAD.  RECOMMENDATION: Angiograms were reviewed with [name].  With the patent stent in the dominant RCA and patent LIMA graft supplying the LAD an attempt at complex intervention to the diffusely diseased vein graft supplying the distal marginal vessel was recommended.  The patient and family are aware of potential distal embolic complications.  The patient will be started on heparin and NTG this evening.  He will be hydrated.  Since he developed  significant stenosis while on Plavix, he will be loaded with Brilinta 180 mg tonight with plans to initiate 90 twice a day tomorrow.  I notifed my colleague Dr. Sanjuana Kava who will review the films tomorrow prior to attempting this complex intervention.  Findings Coronary Findings Diagnostic  Dominance: Right  Left Main Dist LM to Ost LAD lesion is 100% stenosed.  Left Circumflex Ost Cx lesion is 95% stenosed. Prox Cx to Mid Cx lesion is 100% stenosed.  Right Coronary Artery Ost RCA to Prox RCA lesion is 20% stenosed. The lesion was previously treated. Mid RCA lesion is 15% stenosed.  First Right Posterolateral Branch 1st RPLB lesion is 60% stenosed.  Second Right Posterolateral Branch 2nd RPLB lesion is 80% stenosed.  LIMA Graft To Mid LAD  Graft To Dist Cx Origin lesion is 50% stenosed. Prox Graft lesion is 75% stenosed. Prox Graft to Mid Graft lesion is 85% stenosed. Mid Graft to Dist Graft lesion is 95% stenosed.  Graft To Dist RCA Origin lesion is 100% stenosed.  Intervention  No interventions have been documented.   STRESS TESTS  MYOCARDIAL PERFUSION IMAGING 05/31/2017  Narrative  The left ventricular ejection fraction is mildly decreased (45-54%).  Nuclear stress  EF: 49%.  Horizontal ST segment depression ST segment depression of 1 mm was noted during stress in the V2, V4, aVF, II, III and V3 leads.  Defect 1: There is a medium defect of moderate severity present in the mid anterior, mid anterolateral and apical anterior location.  This is an intermediate risk study.  Intermediate risk study with ischemia in the territory of a large diagonal or ramus intermedius artery and mildly depressed left ventricular systolic function. This abnormality may correspond to the known occlusion of the proximal limb of the sequential SVG-diagonal and OM bypass graft (Cardiac cath 2015).      MONITORS  LONG TERM MONITOR (3-14 DAYS) 01/26/2023  Narrative   The  dominant rhythm is normal sinus rhythm with normal circadian variation.   There is no evidence of severe bradycardia, significant pauses or episodes of high-grade AV block.   There are frequent brief episodes of nonsustained ectopic atrial tachycardia (regular narrow complex tachycardia with abrupt onset, distinct P waves and long RP mechanism), maximum 18 seconds in duration.  There is no evidence of atrial fibrillation.   There are rare isolated premature ventricular beats.  Abnormal event monitor due to brief episodes of nonsustained ectopic atrial tachycardia, lasting a maximum of 18 seconds in duration. Atrial fibrillation was not recorded.  Patch Wear Time:  13 days and 15 hours (2024-06-25T18:46:40-0400 to 2024-07-09T10:04:45-0400)  Patient had a min HR of 42 bpm, max HR of 176 bpm, and avg HR of 68 bpm. Predominant underlying rhythm was Sinus Rhythm. 74 Supraventricular Tachycardia runs occurred, the run with the fastest interval lasting 5 beats with a max rate of 176 bpm, the longest lasting 17.9 secs with an avg rate of 114 bpm. Isolated SVEs were rare (<1.0%), SVE Couplets were rare (<1.0%), and SVE Triplets were rare (<1.0%). Isolated VEs were rare (<1.0%, 5457), VE Couplets were rare (<1.0%, 61), and VE Triplets were rare (<1.0%, 2). Ventricular Bigeminy was present.       ______________________________________________________________________________________________       Current Reported Medications:.    Current Meds  Medication Sig   amLODipine (NORVASC) 10 MG tablet TAKE ONE-HALF TABLET BY MOUTH  DAILY   aspirin 81 MG tablet Take 81 mg by mouth daily.   citalopram (CELEXA) 10 MG tablet Take 1 tablet (10 mg total) by mouth daily.   glipiZIDE (GLUCOTROL XL) 5 MG 24 hr tablet Take 5 mg by mouth daily.   insulin glargine (LANTUS) 100 UNIT/ML injection Inject 20 Units into the skin daily.   metoprolol succinate (TOPROL XL) 25 MG 24 hr tablet Take 0.5 tablets (12.5 mg total) by  mouth daily.   nitroGLYCERIN (NITROSTAT) 0.4 MG SL tablet Place 0.4 mg under the tongue every 5 (five) minutes as needed for chest pain.   ONETOUCH ULTRA test strip CHECK BLOOD SUGAR 3 TIMES A DAY OR AS DIRECTED   pantoprazole (PROTONIX) 40 MG tablet TAKE 1 TABLET BY MOUTH DAILY   rosuvastatin (CRESTOR) 20 MG tablet Take 1 tablet (20 mg total) by mouth daily.   ULTICARE MINI PEN NEEDLES 31G X 6 MM MISC USE AS DIRECTED    Physical Exam:    VS:  BP 130/78 (BP Location: Right Arm, Patient Position: Sitting, Cuff Size: Normal)   Pulse 63   Ht 5\' 9"  (1.753 m)   Wt 165 lb (74.8 kg)   SpO2 95%   BMI 24.37 kg/m    Wt Readings from Last 3 Encounters:  09/15/23 165 lb (74.8 kg)  07/19/23  167 lb 8 oz (76 kg)  05/23/23 171 lb 6 oz (77.7 kg)    GEN: Well nourished, well developed in no acute distress NECK: No JVD; No carotid bruits CARDIAC: RRR, no murmurs, rubs, gallops RESPIRATORY:  Clear to auscultation without rales, wheezing or rhonchi  ABDOMEN: Soft, non-tender, non-distended EXTREMITIES:  No edema; No acute deformity   Asessement and Plan:.    CAD: S/p CABG x 4 in 2006 with LIMA to LAD, SVG x 3 to diagonal, OM and PDA.  In 2015 he received a BMS to proximal RCA.  In 2019 he underwent revascularization of the SVG to OM with 2 DES.  Previously unable to tolerate Brilinta.  Today patient reports a burning sensation in his neck that travels to his shoulders and into his chest that has started occurring with exertion, this does improve with rest, he has not required sublingual nitroglycerin.  He reports that his symptoms are the same as when he previously required stenting.  Discussed with Dr. Servando Salina, DOD at [facility] office today who agreed that given similarities to prior anginal equivalent, will plan  for left heart catheterization.  Discussed with patient, he agrees with plan, please see consent below.  He will hold glipizide for 48 hours prior to procedure and hold Lantus morning of procedure.   Continue amlodipine 10 mg daily, aspirin 81 mg daily, metoprolol succinate 12.5 mg daily and Crestor 20 mg daily. Check CBC and BMET.  Informed Consent   Shared Decision Making/Informed Consent The risks [stroke (1 in 1000), death (1 in 1000), kidney failure [usually temporary] (1 in 500), bleeding (1 in 200), allergic reaction [possibly serious] (1 in 200)], benefits (diagnostic support and management of coronary artery disease) and alternatives of a cardiac catheterization were discussed in detail with Mr. Olesen and he is willing to proceed.     CHF: Per Dr. Erin Hearing notes patient had transient reduction in left ventricular systolic function related to ischemia, his last echocardiogram showed normal ventricular function. Today patient appears euvolemic and well compensated on exam. He denies shortness of breath not associated with episodes of chest discomfort, he denies lower extremity edema, orthopnea or pnd.   HTN: Blood pressure today 130/78. Continue current antihypertensive regimen.   PAD: Patient with history of EVAR stent graft for aortic iliac aneurysm in 2011, followed by vascular surgery.  Patient denies any symptoms of claudication.  HLP: Last lipid profile on 12/27/2022 indicated total cholesterol 153, HDL 34, triglycerides 171 and LDL 89.  Per patient his rosuvastatin was increased. Patient will need fasting lipid profile on follow up.   DM: Last hemoglobin A1c 8.4 on 05/04/2023.  Monitor managed per PCP   Disposition: F/u with Reather Littler, NP two weeks after cardiac catheterization   Signed, Rip Harbour, NP

## 2023-09-11 NOTE — Progress Notes (Unsigned)
 Cardiology Office Note    Date:  09/15/2023  ID:  Deontre, Allsup 07-05-1946, MRN [id number] PCP:  [name], [name], MD  Cardiologist:  [name], MD  Electrophysiologist:  None   Chief Complaint: Chest pain   History of Present Illness: .    HRISHIKESH HOEG is a 78 y.o. male with visit-pertinent history of CAD s/p four-vessel bypass surgery in 2006 with LIMA to LAD, SVG x 3 to diagonal, OM and PDA.  In 2015 received a BMS to proximal RCA.  He underwent repeat cardiac catheterization in 08/2017 that showed a long segment of severe stenosis in the SVG to OM, chronically occluded circumflex, patent LIMA to occluded LAD proximal RCA stent with 80% stenosis in the distal branch of the RCA.  On September 09, 2017 he underwent revascularization of the SVG to OM with 2 DES.  Patient was unable to tolerate Brilinta was switched to Plavix.  He has history of EVAR for a AAA and received a stent and a large right common iliac artery aneurysm, monitored by [facility].  Patient also has history of diabetes, hypertension and hyperlipidemia.  Patient was last seen in clinic by [name] on 12/29/2022.  Patient reported 2 or 3 episodes of "burning" in his throat and shoulders that occurred at rest.  It was noted the burning sensation was similar to his previous description of his angina.  He denied experiencing this with physical exertion.  Today patient presents regarding episodes of burning sensation that starts in his throat and travels to bilateral shoulders and into his chest.  Patient reports that last week he was out working in his yard, exerting himself and developed pain that started in his neck and traveled down to his shoulders and into his chest with some associated shortness of breath.  Patient overall felt weak, he stopped his activity and had improvement in symptoms, he did not require sublingual nitroglycerin.  Patient reports that the following day while moving wood in his yard he had a repeat episode, he  did not feel this was as severe however he feels that his symptoms are the same as his prior angina.  Patient reports that in recent weeks he has overall felt more fatigued and feels he has not been able to be as active as he normally is, he is concerned that "his stent is no longer open".  Patient denies palpitations, lower extremity edema, orthopnea or PND.  He denies any presyncope or syncope.  Labwork independently reviewed: 07/19/2023: Sodium 137, potassium 4.3, creatinine 1.15, AST 35, ALT 12, hemoglobin 15.2, hematocrit 45.5 ROS: .   Today he denies lower extremity edema, palpitations, melena, hematuria, hemoptysis, diaphoresis, weakness, presyncope, syncope, orthopnea, and PND.  All other systems are reviewed and otherwise negative. Studies Reviewed: [name]    EKG:  EKG is ordered today, personally reviewed, demonstrating  EKG Interpretation Date/Time:  Thursday September 15 2023 13:07:19 EST Ventricular Rate:  63 PR Interval:  164 QRS Duration:  88 QT Interval:  460 QTC Calculation: 470 R Axis:   -35  Text Interpretation: Normal sinus rhythm Left axis deviation Inferior infarct , age undetermined Possible Anterior infarct , age undetermined When compared with ECG of 29-Dec-2022 10:50, QRS axis Shifted left Confirmed by Reather Littler [phone]) on 09/15/2023 1:53:11 PM   CV Studies:  Cardiac Studies & Procedures   ______________________________________________________________________________________________ CARDIAC CATHETERIZATION  CARDIAC CATHETERIZATION 09/09/2017  Narrative 1. Unstable angina secondary to severe stenosis in the proximal and mid body of the  SVG to the OM 2. Successful PTCA/DES x 1 mid body of SVG to OM 3. Successful PTCA/DES x 1 proximal body of SVG to OM  Recommendations: Continue DAPT for lifetime. Probable discharge home tomorrow.  Findings Coronary Findings Diagnostic  Dominance: Right  No diagnostic findings have been documented. Intervention  No interventions  have been documented.   CARDIAC CATHETERIZATION 09/08/2017  Narrative  Ost RCA to Prox RCA lesion is 20% stenosed.  Mid RCA lesion is 15% stenosed.  2nd RPLB lesion is 80% stenosed.  1st RPLB lesion is 60% stenosed.  Ost Cx lesion is 95% stenosed.  Origin lesion is 50% stenosed.  Dist LM to Ost LAD lesion is 100% stenosed.  Prox Graft lesion is 75% stenosed.  Prox Graft to Mid Graft lesion is 85% stenosed.  Mid Graft to Dist Graft lesion is 95% stenosed.  Origin lesion is 100% stenosed.  Prox Cx to Mid Cx lesion is 100% stenosed.  LV end diastolic pressure is normal.  Severe multivessel native CAD with total ostial occlusion of the LAD, 95% ostial left circumflex stenosis followed by total proximal occlusion; widely patent stent in the ostial to proximal large dominan RCA with mild 20% intimal hyperplasia, irregularity of the mid RCA, 60% stenosis in the PDA and focal 80% stenosis in the PLA branch.  There is faint collateralization to the very distal circumflex from the very distal RCA.  Old occluded vein graft which previously supplied the distal RCA.  Diffusely diseased previous sequential graft which had supplied the diagonal vessel and distal marginal vessel.  The diagonal anastomosis is occluded.  There is ostial eccentric 50% narrowing in the graft followed by 75 and 85% proximal stenoses before a valve.  Beyond the valve.  There is diffuse 95% eccentric stenoses in the graft.  Patent LIMA graft supplying the mid LAD.  RECOMMENDATION: Angiograms were reviewed with [name].  With the patent stent in the dominant RCA and patent LIMA graft supplying the LAD an attempt at complex intervention to the diffusely diseased vein graft supplying the distal marginal vessel was recommended.  The patient and family are aware of potential distal embolic complications.  The patient will be started on heparin and NTG this evening.  He will be hydrated.  Since he developed  significant stenosis while on Plavix, he will be loaded with Brilinta 180 mg tonight with plans to initiate 90 twice a day tomorrow.  I notifed my colleague Dr. Sanjuana Kava who will review the films tomorrow prior to attempting this complex intervention.  Findings Coronary Findings Diagnostic  Dominance: Right  Left Main Dist LM to Ost LAD lesion is 100% stenosed.  Left Circumflex Ost Cx lesion is 95% stenosed. Prox Cx to Mid Cx lesion is 100% stenosed.  Right Coronary Artery Ost RCA to Prox RCA lesion is 20% stenosed. The lesion was previously treated. Mid RCA lesion is 15% stenosed.  First Right Posterolateral Branch 1st RPLB lesion is 60% stenosed.  Second Right Posterolateral Branch 2nd RPLB lesion is 80% stenosed.  LIMA Graft To Mid LAD  Graft To Dist Cx Origin lesion is 50% stenosed. Prox Graft lesion is 75% stenosed. Prox Graft to Mid Graft lesion is 85% stenosed. Mid Graft to Dist Graft lesion is 95% stenosed.  Graft To Dist RCA Origin lesion is 100% stenosed.  Intervention  No interventions have been documented.   STRESS TESTS  MYOCARDIAL PERFUSION IMAGING 05/31/2017  Narrative  The left ventricular ejection fraction is mildly decreased (45-54%).  Nuclear stress  EF: 49%.  Horizontal ST segment depression ST segment depression of 1 mm was noted during stress in the V2, V4, aVF, II, III and V3 leads.  Defect 1: There is a medium defect of moderate severity present in the mid anterior, mid anterolateral and apical anterior location.  This is an intermediate risk study.  Intermediate risk study with ischemia in the territory of a large diagonal or ramus intermedius artery and mildly depressed left ventricular systolic function. This abnormality may correspond to the known occlusion of the proximal limb of the sequential SVG-diagonal and OM bypass graft (Cardiac cath 2015).      MONITORS  LONG TERM MONITOR (3-14 DAYS) 01/26/2023  Narrative   The  dominant rhythm is normal sinus rhythm with normal circadian variation.   There is no evidence of severe bradycardia, significant pauses or episodes of high-grade AV block.   There are frequent brief episodes of nonsustained ectopic atrial tachycardia (regular narrow complex tachycardia with abrupt onset, distinct P waves and long RP mechanism), maximum 18 seconds in duration.  There is no evidence of atrial fibrillation.   There are rare isolated premature ventricular beats.  Abnormal event monitor due to brief episodes of nonsustained ectopic atrial tachycardia, lasting a maximum of 18 seconds in duration. Atrial fibrillation was not recorded.  Patch Wear Time:  13 days and 15 hours (2024-06-25T18:46:40-0400 to 2024-07-09T10:04:45-0400)  Patient had a min HR of 42 bpm, max HR of 176 bpm, and avg HR of 68 bpm. Predominant underlying rhythm was Sinus Rhythm. 74 Supraventricular Tachycardia runs occurred, the run with the fastest interval lasting 5 beats with a max rate of 176 bpm, the longest lasting 17.9 secs with an avg rate of 114 bpm. Isolated SVEs were rare (<1.0%), SVE Couplets were rare (<1.0%), and SVE Triplets were rare (<1.0%). Isolated VEs were rare (<1.0%, 5457), VE Couplets were rare (<1.0%, 61), and VE Triplets were rare (<1.0%, 2). Ventricular Bigeminy was present.       ______________________________________________________________________________________________       Current Reported Medications:.    Current Meds  Medication Sig   amLODipine (NORVASC) 10 MG tablet TAKE ONE-HALF TABLET BY MOUTH  DAILY   aspirin 81 MG tablet Take 81 mg by mouth daily.   citalopram (CELEXA) 10 MG tablet Take 1 tablet (10 mg total) by mouth daily.   glipiZIDE (GLUCOTROL XL) 5 MG 24 hr tablet Take 5 mg by mouth daily.   insulin glargine (LANTUS) 100 UNIT/ML injection Inject 20 Units into the skin daily.   metoprolol succinate (TOPROL XL) 25 MG 24 hr tablet Take 0.5 tablets (12.5 mg total) by  mouth daily.   nitroGLYCERIN (NITROSTAT) 0.4 MG SL tablet Place 0.4 mg under the tongue every 5 (five) minutes as needed for chest pain.   ONETOUCH ULTRA test strip CHECK BLOOD SUGAR 3 TIMES A DAY OR AS DIRECTED   pantoprazole (PROTONIX) 40 MG tablet TAKE 1 TABLET BY MOUTH DAILY   rosuvastatin (CRESTOR) 20 MG tablet Take 1 tablet (20 mg total) by mouth daily.   ULTICARE MINI PEN NEEDLES 31G X 6 MM MISC USE AS DIRECTED    Physical Exam:    VS:  BP 130/78 (BP Location: Right Arm, Patient Position: Sitting, Cuff Size: Normal)   Pulse 63   Ht 5\' 9"  (1.753 m)   Wt 165 lb (74.8 kg)   SpO2 95%   BMI 24.37 kg/m    Wt Readings from Last 3 Encounters:  09/15/23 165 lb (74.8 kg)  07/19/23  167 lb 8 oz (76 kg)  05/23/23 171 lb 6 oz (77.7 kg)    GEN: Well nourished, well developed in no acute distress NECK: No JVD; No carotid bruits CARDIAC: RRR, no murmurs, rubs, gallops RESPIRATORY:  Clear to auscultation without rales, wheezing or rhonchi  ABDOMEN: Soft, non-tender, non-distended EXTREMITIES:  No edema; No acute deformity   Asessement and Plan:.    CAD: S/p CABG x 4 in 2006 with LIMA to LAD, SVG x 3 to diagonal, OM and PDA.  In 2015 he received a BMS to proximal RCA.  In 2019 he underwent revascularization of the SVG to OM with 2 DES.  Previously unable to tolerate Brilinta.  Today patient reports a burning sensation in his neck that travels to his shoulders and into his chest that has started occurring with exertion, this does improve with rest, he has not required sublingual nitroglycerin.  He reports that his symptoms are the same as when he previously required stenting.  Discussed with Dr. Servando Salina, DOD at [facility] office today who agreed that given similarities to prior anginal equivalent, will plan  for left heart catheterization.  Discussed with patient, he agrees with plan, please see consent below.  He will hold glipizide for 48 hours prior to procedure and hold Lantus morning of procedure.   Continue amlodipine 10 mg daily, aspirin 81 mg daily, metoprolol succinate 12.5 mg daily and Crestor 20 mg daily. Check CBC and BMET.  Informed Consent   Shared Decision Making/Informed Consent The risks [stroke (1 in 1000), death (1 in 1000), kidney failure [usually temporary] (1 in 500), bleeding (1 in 200), allergic reaction [possibly serious] (1 in 200)], benefits (diagnostic support and management of coronary artery disease) and alternatives of a cardiac catheterization were discussed in detail with Mr. Orlich and he is willing to proceed.     CHF: Per Dr. Erin Hearing notes patient had transient reduction in left ventricular systolic function related to ischemia, his last echocardiogram showed normal ventricular function. Today patient appears euvolemic and well compensated on exam. He denies shortness of breath not associated with episodes of chest discomfort, he denies lower extremity edema, orthopnea or pnd.   HTN: Blood pressure today 130/78. Continue current antihypertensive regimen.   PAD: Patient with history of EVAR stent graft for aortic iliac aneurysm in 2011, followed by vascular surgery.  Patient denies any symptoms of claudication.  HLP: Last lipid profile on 12/27/2022 indicated total cholesterol 153, HDL 34, triglycerides 171 and LDL 89.  Per patient his rosuvastatin was increased. Patient will need fasting lipid profile on follow up.   DM: Last hemoglobin A1c 8.4 on 05/04/2023.  Monitor managed per PCP   Disposition: F/u with Reather Littler, NP two weeks after cardiac catheterization   Signed, Rip Harbour, NP

## 2023-09-12 NOTE — Telephone Encounter (Signed)
 Has f/u with cardiology 3/6

## 2023-09-15 ENCOUNTER — Telehealth: Payer: Self-pay | Admitting: Cardiology

## 2023-09-15 ENCOUNTER — Encounter: Payer: Self-pay | Admitting: Cardiology

## 2023-09-15 ENCOUNTER — Ambulatory Visit: Payer: Medicare Other | Attending: Cardiology | Admitting: Cardiology

## 2023-09-15 VITALS — BP 130/78 | HR 63 | Ht 69.0 in | Wt 165.0 lb

## 2023-09-15 DIAGNOSIS — Z794 Long term (current) use of insulin: Secondary | ICD-10-CM

## 2023-09-15 DIAGNOSIS — I25119 Atherosclerotic heart disease of native coronary artery with unspecified angina pectoris: Secondary | ICD-10-CM | POA: Diagnosis not present

## 2023-09-15 DIAGNOSIS — E1140 Type 2 diabetes mellitus with diabetic neuropathy, unspecified: Secondary | ICD-10-CM | POA: Diagnosis not present

## 2023-09-15 DIAGNOSIS — E782 Mixed hyperlipidemia: Secondary | ICD-10-CM | POA: Diagnosis not present

## 2023-09-15 DIAGNOSIS — I5032 Chronic diastolic (congestive) heart failure: Secondary | ICD-10-CM

## 2023-09-15 DIAGNOSIS — I739 Peripheral vascular disease, unspecified: Secondary | ICD-10-CM

## 2023-09-15 DIAGNOSIS — I10 Essential (primary) hypertension: Secondary | ICD-10-CM | POA: Diagnosis not present

## 2023-09-15 DIAGNOSIS — E785 Hyperlipidemia, unspecified: Secondary | ICD-10-CM

## 2023-09-15 DIAGNOSIS — I2089 Other forms of angina pectoris: Secondary | ICD-10-CM

## 2023-09-15 NOTE — Patient Instructions (Signed)
 Medication Instructions:  HOLD THE GLIPIZIDE 48 HOURS PRIOR TO THE CATH PROCEDURE.  HOLD THE LANTUS THE MORNING OF THE CATH PROCEDURE.  YOU MAY TAKE THE ASPIRIN 81 MG THE MORNING OF THE CATH PROCEDURE.  *If you need a refill on your cardiac medications before your next appointment, please call your pharmacy*   Lab Work: LAB WILL BE DRAWN TODAY. If you have labs (blood work) drawn today and your tests are completely normal, you will receive your results only by: MyChart Message (if you have MyChart) OR A paper copy in the mail If you have any lab test that is abnormal or we need to change your treatment, we will call you to review the results.   Testing/Procedures:  Your physician has requested that you have a cardiac catheterization. Cardiac catheterization is used to diagnose and/or treat various heart conditions. Doctors may recommend this procedure for a number of different reasons. The most common reason is to evaluate chest pain. Chest pain can be a symptom of coronary artery disease (CAD), and cardiac catheterization can show whether plaque is narrowing or blocking your heart's arteries. This procedure is also used to evaluate the valves, as well as measure the blood flow and oxygen levels in different parts of your heart. For further information please visit [email or web address] Please follow instruction sheet, as given.  .   Follow-Up: At [facility], you and your health needs are our priority.  As part of our continuing mission to provide you with exceptional heart care, we have created designated Provider Care Teams.  These Care Teams include your primary Cardiologist (physician) and Advanced Practice Providers (APPs -  Physician Assistants and Nurse Practitioners) who all work together to provide you with the care you need, when you need it.  We recommend signing up for the patient portal called "MyChart".  Sign up information is provided on this After Visit Summary.   MyChart is used to connect with patients for Virtual Visits (Telemedicine).  Patients are able to view lab/test results, encounter notes, upcoming appointments, etc.  Non-urgent messages can be sent to your provider as well.   To learn more about what you can do with MyChart, go to [email or web address].    Your next appointment:   2 week(s)  Provider:   Reather Littler, NP  Then, [name], MD will plan to see you again in 3-4 month(s).    Other Instructions        1st Floor: - Lobby - Registration  - Pharmacy  - Lab - Cafe   2nd Floor: - PV Lab - Diagnostic Testing (echo, CT, nuclear med)   3rd Floor: - Vacant   4th Floor: - TCTS (cardiothoracic surgery) - AFib Clinic - Structural Heart Clinic - Vascular Surgery  - Vascular Ultrasound   5th Floor: - HeartCare Cardiology (general and EP) - Clinical Pharmacy for coumadin, hypertension, lipid, weight-loss medications, and med management appointments      Valet parking services will be available as well.      Thank you for choosing [facility]!          [facility] A DEPT OF [facility] [address] [place] [address] [place] [address] Dept: [phone] Loc: [phone]  [name]  09/15/2023  You are scheduled for a Cardiac Catheterization on Wednesday, March 19 with Dr. Verne Carrow.  1. Please arrive at the [facility] (Main Entrance A) at [facility]:  [address] [place], [place] [address] at 6:30 AM (This time is 2  hour(s) before your procedure to ensure your preparation).   Free valet parking service is available. You will check in at ADMITTING. The support person will be asked to wait in the waiting room.  It is OK to have someone drop you off and come back when you are ready to be discharged.    Special note: Every effort is made to have your procedure done on time. Please understand that  emergencies sometimes delay scheduled procedures.  2. Diet: Do not eat solid foods after midnight.  The patient may have clear liquids until 5am upon the day of the procedure.  3. Labs:  CBC, BMP   4. Medication instructions in preparation for your procedure:   Contrast Allergy: No   HOLD THE GLIPIZIDE 48 HOURS PRIOR TO THE CATH PROCEDURE.  HOLD THE LANTUS THE MORNING OF THE CATH PROCEDURE.   On the morning of your procedure, take your Aspirin 81 mg and any morning medicines NOT listed above.  You may use sips of water.  5. Plan to go home the same day, you will only stay overnight if medically necessary. 6. Bring a current list of your medications and current insurance cards. 7. You MUST have a responsible person to drive you home. 8. Someone MUST be with you the first 24 hours after you arrive home or your discharge will be delayed. 9. Please wear clothes that are easy to get on and off and wear slip-on shoes.  Thank you for allowing [place] to care for you!   -- [facility]

## 2023-09-15 NOTE — Telephone Encounter (Signed)
 Lvmtcb to sched 2w f/u post-cath w/K.[facility] or C.Jerry Caras, 09-15-23

## 2023-09-16 LAB — CBC
Hematocrit: 42.2 % (ref 37.5–51.0)
Hemoglobin: 14.2 g/dL (ref 13.0–17.7)
MCH: 28.7 pg (ref 26.6–33.0)
MCHC: 33.6 g/dL (ref 31.5–35.7)
MCV: 85 fL (ref 79–97)
Platelets: 218 10*3/uL (ref 150–450)
RBC: 4.95 x10E6/uL (ref 4.14–5.80)
RDW: 13.1 % (ref 11.6–15.4)
WBC: 7 10*3/uL (ref 3.4–10.8)

## 2023-09-16 LAB — BASIC METABOLIC PANEL
BUN/Creatinine Ratio: 12 (ref 10–24)
BUN: 14 mg/dL (ref 8–27)
CO2: 23 mmol/L (ref 20–29)
Calcium: 9 mg/dL (ref 8.6–10.2)
Chloride: 101 mmol/L (ref 96–106)
Creatinine, Ser: 1.19 mg/dL (ref 0.76–1.27)
Glucose: 241 mg/dL — ABNORMAL HIGH (ref 70–99)
Potassium: 3.9 mmol/L (ref 3.5–5.2)
Sodium: 138 mmol/L (ref 134–144)
eGFR: 63 mL/min/{1.73_m2} (ref >59)

## 2023-09-16 NOTE — Telephone Encounter (Signed)
 I spoke with pt and his wife. He sees vascular yearly and is going to have a cauterization done by cardiology later this month. I did let them know that you may order the ultrasound to check the circulation.

## 2023-09-16 NOTE — Telephone Encounter (Signed)
 Hx of PAD. Evaluated by cardiologist yesterday and according to note he does not have claudication like symptoms and follows with vascular. I could not see when his next appt with vascular is, he needs to address this results with provider. Thanks, BJ

## 2023-09-19 ENCOUNTER — Telehealth: Payer: Self-pay

## 2023-09-19 NOTE — Telephone Encounter (Signed)
-----   Message from [facility] sent at 09/16/2023  2:00 PM EST ----- Please let [name] know that his CBC shows no evidence of anemia or infection. His kidney function is normal and his electrolytes are normal. He can proceed with procedure as planned.

## 2023-09-19 NOTE — Telephone Encounter (Signed)
 Called patient advised of below they verbalized understanding.

## 2023-09-27 ENCOUNTER — Telehealth: Payer: Self-pay | Admitting: *Deleted

## 2023-09-27 NOTE — Telephone Encounter (Addendum)
 Cardiac Catheterization scheduled at [facility] for: Wednesday September 28, 2023 8:30 AM Arrival time [facility] Main Entrance A at: 6:30 AM  Nothing to eat after midnight prior to procedure, clear liquids until 5 AM day of procedure.  Medication instructions: -Hold:  Insulin-AM of procedure  Glipizide (hasn't started yet) -Other usual morning medications can be taken with sips of water including aspirin 81 mg.  Plan to go home the same day, you will only stay overnight if medically necessary.  You must have responsible adult to drive you home.  Someone must be with you the first 24 hours after you arrive home.  Reviewed procedure instructions with patient.

## 2023-09-28 ENCOUNTER — Other Ambulatory Visit (HOSPITAL_COMMUNITY): Payer: Self-pay

## 2023-09-28 ENCOUNTER — Other Ambulatory Visit: Payer: Self-pay

## 2023-09-28 ENCOUNTER — Encounter (HOSPITAL_COMMUNITY)
Admission: RE | Disposition: A | Discharge status: Home / Self Care | Payer: Self-pay | Source: Home / Self Care | Attending: Cardiovascular Disease

## 2023-09-28 ENCOUNTER — Ambulatory Visit (HOSPITAL_COMMUNITY)
Admission: RE | Admit: 2023-09-28 | Discharge: 2023-09-28 | Disposition: A | Discharge status: Home / Self Care | Attending: Cardiovascular Disease | Admitting: Cardiovascular Disease

## 2023-09-28 DIAGNOSIS — Z951 Presence of aortocoronary bypass graft: Secondary | ICD-10-CM | POA: Insufficient documentation

## 2023-09-28 DIAGNOSIS — E1151 Type 2 diabetes mellitus with diabetic peripheral angiopathy without gangrene: Secondary | ICD-10-CM | POA: Insufficient documentation

## 2023-09-28 DIAGNOSIS — Z955 Presence of coronary angioplasty implant and graft: Secondary | ICD-10-CM

## 2023-09-28 DIAGNOSIS — I2582 Chronic total occlusion of coronary artery: Secondary | ICD-10-CM | POA: Insufficient documentation

## 2023-09-28 DIAGNOSIS — E785 Hyperlipidemia, unspecified: Secondary | ICD-10-CM | POA: Insufficient documentation

## 2023-09-28 DIAGNOSIS — Z8679 Personal history of other diseases of the circulatory system: Secondary | ICD-10-CM | POA: Diagnosis not present

## 2023-09-28 DIAGNOSIS — Z8774 Personal history of (corrected) congenital malformations of heart and circulatory system: Secondary | ICD-10-CM | POA: Diagnosis not present

## 2023-09-28 DIAGNOSIS — Z7984 Long term (current) use of oral hypoglycemic drugs: Secondary | ICD-10-CM | POA: Insufficient documentation

## 2023-09-28 DIAGNOSIS — Z794 Long term (current) use of insulin: Secondary | ICD-10-CM | POA: Insufficient documentation

## 2023-09-28 DIAGNOSIS — T82855A Stenosis of coronary artery stent, initial encounter: Secondary | ICD-10-CM | POA: Diagnosis not present

## 2023-09-28 DIAGNOSIS — I509 Heart failure, unspecified: Secondary | ICD-10-CM | POA: Insufficient documentation

## 2023-09-28 DIAGNOSIS — I110 Hypertensive heart disease with heart failure: Secondary | ICD-10-CM | POA: Diagnosis not present

## 2023-09-28 DIAGNOSIS — I25110 Atherosclerotic heart disease of native coronary artery with unstable angina pectoris: Secondary | ICD-10-CM | POA: Diagnosis not present

## 2023-09-28 DIAGNOSIS — I25119 Atherosclerotic heart disease of native coronary artery with unspecified angina pectoris: Secondary | ICD-10-CM | POA: Diagnosis present

## 2023-09-28 DIAGNOSIS — I200 Unstable angina: Secondary | ICD-10-CM | POA: Diagnosis present

## 2023-09-28 HISTORY — PX: LEFT HEART CATH AND CORS/GRAFTS ANGIOGRAPHY: CATH118250

## 2023-09-28 HISTORY — PX: CORONARY STENT INTERVENTION: CATH118234

## 2023-09-28 LAB — POCT ACTIVATED CLOTTING TIME
Activated Clotting Time: 279 s
Activated Clotting Time: 302 s

## 2023-09-28 LAB — GLUCOSE, CAPILLARY
Glucose-Capillary: 169 mg/dL — ABNORMAL HIGH (ref 70–99)
Glucose-Capillary: 129 mg/dL — ABNORMAL HIGH (ref 70–99)

## 2023-09-28 SURGERY — LEFT HEART CATH AND CORS/GRAFTS ANGIOGRAPHY
Anesthesia: LOCAL

## 2023-09-28 MED ORDER — ASPIRIN 81 MG PO TABS: 81.0000 mg | ORAL_TABLET | Freq: Every day | ORAL | Status: DC

## 2023-09-28 MED ORDER — SODIUM CHLORIDE 0.9% FLUSH: 3.0000 mL | Freq: Two times a day (BID) | INTRAVENOUS | Status: DC

## 2023-09-28 MED ORDER — FAMOTIDINE IN NACL 20-0.9 MG/50ML-% IV SOLN
INTRAVENOUS | Status: DC | PRN
  Administered 2023-09-28: 20 mg via INTRAVENOUS

## 2023-09-28 MED ORDER — SODIUM CHLORIDE 0.9 % WEIGHT BASED INFUSION: 3.0000 mL/kg/h | INTRAVENOUS | Status: AC

## 2023-09-28 MED ORDER — METOPROLOL SUCCINATE ER 25 MG PO TB24: 12.5000 mg | ORAL_TABLET | Freq: Every day | ORAL | Status: DC

## 2023-09-28 MED ORDER — AMLODIPINE BESYLATE 5 MG PO TABS: 5.0000 mg | ORAL_TABLET | Freq: Every day | ORAL | Status: DC

## 2023-09-28 MED ORDER — SODIUM CHLORIDE 0.9% FLUSH: 3.0000 mL | INTRAVENOUS | Status: DC | PRN

## 2023-09-28 MED ORDER — HEPARIN (PORCINE) IN NACL 1000-0.9 UT/500ML-% IV SOLN
INTRAVENOUS | Status: DC | PRN
  Administered 2023-09-28 (×2): 500 mL

## 2023-09-28 MED ORDER — CLOPIDOGREL BISULFATE 300 MG PO TABS
ORAL_TABLET | ORAL | Status: AC
  Filled 2023-09-28: qty 2

## 2023-09-28 MED ORDER — FAMOTIDINE IN NACL 20-0.9 MG/50ML-% IV SOLN
INTRAVENOUS | Status: AC
  Filled 2023-09-28: qty 50

## 2023-09-28 MED ORDER — PANTOPRAZOLE SODIUM 40 MG PO TBEC: 40.0000 mg | DELAYED_RELEASE_TABLET | Freq: Every day | ORAL | Status: DC

## 2023-09-28 MED ORDER — SODIUM CHLORIDE 0.9 % WEIGHT BASED INFUSION: 1.0000 mL/kg/h | INTRAVENOUS | Status: DC

## 2023-09-28 MED ORDER — LIDOCAINE HCL (PF) 1 % IJ SOLN
INTRAMUSCULAR | Status: DC | PRN
  Administered 2023-09-28: 2 mL via INTRADERMAL

## 2023-09-28 MED ORDER — IOHEXOL 350 MG/ML SOLN
INTRAVENOUS | Status: DC | PRN
  Administered 2023-09-28: 65 mL

## 2023-09-28 MED ORDER — SODIUM CHLORIDE 0.9 % IV SOLN: INTRAVENOUS | Status: AC

## 2023-09-28 MED ORDER — ACETAMINOPHEN 325 MG PO TABS: 650.0000 mg | ORAL_TABLET | ORAL | Status: DC | PRN

## 2023-09-28 MED ORDER — NITROGLYCERIN 0.4 MG SL SUBL: 0.4000 mg | SUBLINGUAL_TABLET | SUBLINGUAL | Status: DC | PRN

## 2023-09-28 MED ORDER — INSULIN GLARGINE 100 UNIT/ML ~~LOC~~ SOLN: 22.0000 [IU] | Freq: Every day | SUBCUTANEOUS | Status: DC

## 2023-09-28 MED ORDER — HEPARIN SODIUM (PORCINE) 1000 UNIT/ML IJ SOLN
INTRAMUSCULAR | Status: AC
  Filled 2023-09-28: qty 10

## 2023-09-28 MED ORDER — ONDANSETRON HCL 4 MG/2ML IJ SOLN: 4.0000 mg | Freq: Four times a day (QID) | INTRAMUSCULAR | Status: DC | PRN

## 2023-09-28 MED ORDER — MIDAZOLAM HCL 2 MG/2ML IJ SOLN
INTRAMUSCULAR | Status: AC
Start: 2023-09-28 — End: ?
  Filled 2023-09-28: qty 2

## 2023-09-28 MED ORDER — FENTANYL CITRATE (PF) 100 MCG/2ML IJ SOLN
INTRAMUSCULAR | Status: DC | PRN
  Administered 2023-09-28 (×2): 25 ug via INTRAVENOUS

## 2023-09-28 MED ORDER — SODIUM CHLORIDE 0.9 % IV SOLN: 250.0000 mL | INTRAVENOUS | Status: DC | PRN

## 2023-09-28 MED ORDER — VERAPAMIL HCL 2.5 MG/ML IV SOLN
INTRAVENOUS | Status: DC | PRN
  Administered 2023-09-28: 10 mL via INTRA_ARTERIAL

## 2023-09-28 MED ORDER — FENTANYL CITRATE (PF) 100 MCG/2ML IJ SOLN
INTRAMUSCULAR | Status: AC
  Filled 2023-09-28: qty 2

## 2023-09-28 MED ORDER — LIDOCAINE HCL (PF) 1 % IJ SOLN
INTRAMUSCULAR | Status: AC
  Filled 2023-09-28: qty 30

## 2023-09-28 MED ORDER — ASPIRIN 81 MG PO CHEW: 81.0000 mg | CHEWABLE_TABLET | ORAL | Status: DC

## 2023-09-28 MED ORDER — ROSUVASTATIN CALCIUM 20 MG PO TABS: 20.0000 mg | ORAL_TABLET | Freq: Every day | ORAL | Status: DC

## 2023-09-28 MED ORDER — LABETALOL HCL 5 MG/ML IV SOLN: 10.0000 mg | INTRAVENOUS | Status: DC | PRN

## 2023-09-28 MED ORDER — CLOPIDOGREL BISULFATE 300 MG PO TABS
ORAL_TABLET | ORAL | Status: DC | PRN
  Administered 2023-09-28: 600 mg via ORAL

## 2023-09-28 MED ORDER — HEPARIN SODIUM (PORCINE) 1000 UNIT/ML IJ SOLN
INTRAMUSCULAR | Status: DC | PRN
  Administered 2023-09-28: 4000 [IU] via INTRAVENOUS
  Administered 2023-09-28: 2000 [IU] via INTRAVENOUS
  Administered 2023-09-28: 6000 [IU] via INTRAVENOUS

## 2023-09-28 MED ORDER — MIDAZOLAM HCL 2 MG/2ML IJ SOLN
INTRAMUSCULAR | Status: DC | PRN
  Administered 2023-09-28 (×2): 1 mg via INTRAVENOUS

## 2023-09-28 MED ORDER — CLOPIDOGREL BISULFATE 75 MG PO TABS
75.0000 mg | ORAL_TABLET | Freq: Every day | ORAL | 3 refills | Status: DC
Start: 2023-09-28 — End: 2023-10-14
  Filled 2023-09-28: qty 90, 90d supply, fill #0

## 2023-09-28 MED ORDER — HYDRALAZINE HCL 20 MG/ML IJ SOLN: 10.0000 mg | INTRAMUSCULAR | Status: DC | PRN

## 2023-09-28 MED ORDER — VERAPAMIL HCL 2.5 MG/ML IV SOLN
INTRAVENOUS | Status: AC
  Filled 2023-09-28: qty 2

## 2023-09-28 SURGICAL SUPPLY — 25 items
BALLN EMERGE MR 2.5X15 (BALLOONS) ×1 IMPLANT
BALLN ~~LOC~~ EMERGE MR 4.0X15 (BALLOONS) ×1 IMPLANT
BALLN ~~LOC~~ EMERGE MR 5.0X15 (BALLOONS) ×1 IMPLANT
BALLOON EMERGE MR 2.5X15 (BALLOONS) IMPLANT
BALLOON ~~LOC~~ EMERGE MR 4.0X15 (BALLOONS) IMPLANT
BALLOON ~~LOC~~ EMERGE MR 5.0X15 (BALLOONS) IMPLANT
CATH INFINITI 5FR MULTPACK ANG (CATHETERS) IMPLANT
CATH LAUNCHER 6FR AL.75 (CATHETERS) IMPLANT
DEVICE RAD COMP TR BAND LRG (VASCULAR PRODUCTS) IMPLANT
GLIDESHEATH SLEND SS 6F .021 (SHEATH) IMPLANT
GUIDEWIRE INQWIRE 1.5J.035X260 (WIRE) IMPLANT
INQWIRE 1.5J .035X260CM (WIRE) ×1 IMPLANT
KIT ENCORE 26 ADVANTAGE (KITS) IMPLANT
KIT ESSENTIALS PG (KITS) IMPLANT
KIT MICROPUNCTURE NIT STIFF (SHEATH) IMPLANT
KIT SINGLE USE MANIFOLD (KITS) IMPLANT
KIT SYRINGE INJ CVI SPIKEX1 (MISCELLANEOUS) IMPLANT
PACK CARDIAC CATHETERIZATION (CUSTOM PROCEDURE TRAY) ×1 IMPLANT
SET ATX-X65L (MISCELLANEOUS) IMPLANT
SHEATH PINNACLE 5F 10CM (SHEATH) IMPLANT
SHEATH PROBE COVER 6X72 (BAG) IMPLANT
STENT SYNERGY XD 4.0X28 (Permanent Stent) IMPLANT
STENT SYS SYNERGY XD 4.0X28 (Permanent Stent) ×1 IMPLANT
WIRE ASAHI PROWATER 180CM (WIRE) IMPLANT
WIRE EMERALD 3MM-J .035X150CM (WIRE) IMPLANT

## 2023-09-28 NOTE — Interval H&P Note (Signed)
 History and Physical Interval Note:  09/28/2023 8:26 AM  [name]  has presented today for surgery, with the diagnosis of angina.  The various methods of treatment have been discussed with the patient and family. After consideration of risks, benefits and other options for treatment, the patient has consented to  Procedure(s): LEFT HEART CATH AND CORS/GRAFTS ANGIOGRAPHY (N/A) as a surgical intervention.  The patient's history has been reviewed, patient examined, no change in status, stable for surgery.  I have reviewed the patient's chart and labs.  Questions were answered to the patient's satisfaction.    Cath Lab Visit (complete for each Cath Lab visit)  Clinical Evaluation Leading to the Procedure:   ACS: No.  Non-ACS:    Anginal Classification: CCS III  Anti-ischemic medical therapy: Maximal Therapy (2 or more classes of medications)  Non-Invasive Test Results: No non-invasive testing performed  Prior CABG: Previous CABG   Verne Carrow

## 2023-09-28 NOTE — Progress Notes (Signed)
 CARDIAC REHAB PHASE I     Post stent education including site care, restrictions, risk factors, exercise guidelines, NTG use, antiplatelet therapy importance, heart healthy diabetic diet and CRP2 reviewed. All questions and concerns addressed. Will refer to [facility] for CRP2. Plan for home later today.    1300-1330 Woodroe Chen, RN BSN 09/28/2023 2:04 PM

## 2023-09-28 NOTE — Discharge Summary (Signed)
 Discharge Summary for Same Day PCI   Patient ID: [name] MRN: [id number]; DOB: 1945-08-25  Admit date: 09/28/2023 Discharge date: 09/28/2023  Primary Care Provider: [name], Scott G, MD  Primary Cardiologist: [name], MD   Primary Electrophysiologist:  None   Discharge Diagnoses    Active Problems:   Coronary artery disease involving native coronary artery of native heart with angina pectoris [facility])  Diagnostic Studies/Procedures    Cardiac Catheterization 09/28/2023:    Prox RCA lesion is 99% stenosed.   RPDA lesion is 90% stenosed.   RPAV lesion is 50% stenosed.   Dist RCA lesion is 30% stenosed.   Ost LAD to Prox LAD lesion is 100% stenosed.   Ost Cx to Prox Cx lesion is 100% stenosed.   Origin lesion is 30% stenosed.   Previously placed Prox Graft stent of unknown type is  widely patent.   Previously placed Mid Graft stent of unknown type is  widely patent.   A drug-eluting stent was successfully placed using a STENT SYS SYNERGY XD 4.0X28.   Post intervention, there is a 0% residual stenosis.   LIMA graft was visualized by angiography and is normal in caliber.   SVG graft was visualized by angiography and is normal in caliber.   Severe three vessel CAD s/p 3V CABG with 2/3 patent bypass grafts Chronic occlusion of the ostial LAD (not injected today). Patent LIMA graft to the mid LAD.  Chronic occlusion of the proximal Circumflex (not injected today). Patent vein graft to the obtuse marginal branch with patent stents in the proximal and mid body of the vein graft. Mild proximal vein graft stenosis, unchanged from last cath.  Large dominant RCA with proximal stented segment with severe in stent restenosis. The mid and distal RCA is heavily calcified with mild disease. The small caliber PDA has  severe stenosis (too small for PCI) LVEDP 20 mmHg   Recommendations: ASA and Plavix for at least six months. Same day post PCI discharge. Aggressive risk factor  modification.   Diagnostic Dominance: Right  Intervention    _____________   History of Present Illness     [name] is a 78 y.o. male with a past medical history of CAD s/p four-vessel bypass surgery in 2006 with LIMA to LAD, SVG x 3 to diagonal, OM and PDA.  In 2015 received a BMS to proximal RCA.  He underwent repeat cardiac catheterization in 08/2017 that showed a long segment of severe stenosis in the SVG to OM, chronically occluded circumflex, patent LIMA to occluded LAD proximal RCA stent with 80% stenosis in the distal branch of the RCA.  On September 09, 2017 he underwent revascularization of the SVG to OM with 2 DES.  Patient was unable to tolerate Brilinta was switched to Plavix.  He has history of EVAR for a AAA and received a stent and a large right common iliac artery aneurysm, monitored by [facility].  Patient also has history of diabetes, hypertension and hyperlipidemia.   Patient was last seen in clinic by [name] on 12/29/2022.  Patient reported 2 or 3 episodes of "burning" in his throat and shoulders that occurred at rest.  It was noted the burning sensation was similar to his previous description of his angina.  He denied experiencing this with physical exertion.   When seen in clinic on 3/6, patient presented regarding episodes of burning sensation that started in his throat and traveled to bilateral shoulders and into his chest.  Patient  reported that the week prior, he was out working in his yard, exerting himself and developed pain that started in his neck and traveled down to his shoulders and into his chest with some associated shortness of breath.  Patient overall felt weak, he stopped his activity and had improvement in symptoms, he did not require sublingual nitroglycerin.  Patient reported that the following day while moving wood in his yard he had a repeat episode, he did not feel this was as severe however he felt that his symptoms are the same as his prior angina.   Patient reported that in recent weeks he had been feeling more fatigued and felt that he had not been able to be as active as he normally was. He was concerned that "his stent is no longer open".  Patient denied palpitations, lower extremity edema, orthopnea or PND.  He denied any presyncope or syncope..   Cardiac catheterization was arranged for further evaluation.  Hospital Course     The patient underwent cardiac cath as noted above with PCI/DES to the proximal RCA for instent restenosis. Plan for DAPT with ASA/plavix for at least 6 months. The patient was seen by cardiac rehab while in short stay. There were no observed complications post cath. Radial cath site was re-evaluated prior to discharge and found to be stable without any complications. Instructions/precautions regarding cath site care were given prior to discharge.  [name] was seen by Dr. Clifton James and determined stable for discharge home. Follow up with our office has been arranged. Medications are listed below. Pertinent changes include addition of plavix 75 mg daily.  He has follow up with general cardiology on 10/14/23.     _____________  Cath/PCI Registry Performance & Quality Measures: Aspirin prescribed? - Yes ADP Receptor Inhibitor (Plavix/Clopidogrel, Brilinta/Ticagrelor or Effient/Prasugrel) prescribed (includes medically managed patients)? - Yes High Intensity Statin (Lipitor 40-80mg  or Crestor 20-40mg ) prescribed? - Yes For EF <40%, was ACEI/ARB prescribed? - Not Applicable (EF >/= 40%) For EF <40%, Aldosterone Antagonist (Spironolactone or Eplerenone) prescribed? - Not Applicable (EF >/= 40%) Cardiac Rehab Phase II ordered (Included Medically managed Patients)? - Yes  _____________   Discharge Vitals Blood pressure (!) 139/92, pulse 62, temperature 97.9 F (36.6 C), temperature source Oral, resp. rate 19, height 5\' 9"  (1.753 m), weight 74.8 kg, SpO2 98%.  Filed Weights   09/28/23 0646  Weight: 74.8 kg     Last Labs & Radiologic Studies    CBC No results for input(s): "WBC", "NEUTROABS", "HGB", "HCT", "MCV", "PLT" in the last 72 hours. Basic Metabolic Panel No results for input(s): "NA", "K", "CL", "CO2", "GLUCOSE", "BUN", "CREATININE", "CALCIUM", "MG", "PHOS" in the last 72 hours. Liver Function Tests No results for input(s): "AST", "ALT", "ALKPHOS", "BILITOT", "PROT", "ALBUMIN" in the last 72 hours. No results for input(s): "LIPASE", "AMYLASE" in the last 72 hours. High Sensitivity Troponin:   No results for input(s): "TROPONINIHS" in the last 720 hours.  BNP Invalid input(s): "POCBNP" D-Dimer No results for input(s): "DDIMER" in the last 72 hours. Hemoglobin A1C No results for input(s): "HGBA1C" in the last 72 hours. Fasting Lipid Panel No results for input(s): "CHOL", "HDL", "LDLCALC", "TRIG", "CHOLHDL", "LDLDIRECT" in the last 72 hours. Thyroid Function Tests No results for input(s): "TSH", "T4TOTAL", "T3FREE", "THYROIDAB" in the last 72 hours.  Invalid input(s): "FREET3" _____________  CARDIAC CATHETERIZATION Result Date: 09/28/2023   Prox RCA lesion is 99% stenosed.   RPDA lesion is 90% stenosed.   RPAV lesion is 50% stenosed.  Dist RCA lesion is 30% stenosed.   Ost LAD to Prox LAD lesion is 100% stenosed.   Ost Cx to Prox Cx lesion is 100% stenosed.   Origin lesion is 30% stenosed.   Previously placed Prox Graft stent of unknown type is  widely patent.   Previously placed Mid Graft stent of unknown type is  widely patent.   A drug-eluting stent was successfully placed using a STENT SYS SYNERGY XD 4.0X28.   Post intervention, there is a 0% residual stenosis.   LIMA graft was visualized by angiography and is normal in caliber.   SVG graft was visualized by angiography and is normal in caliber. Severe three vessel CAD s/p 3V CABG with 2/3 patent bypass grafts Chronic occlusion of the ostial LAD (not injected today). Patent LIMA graft to the mid LAD. Chronic occlusion of the  proximal Circumflex (not injected today). Patent vein graft to the obtuse marginal branch with patent stents in the proximal and mid body of the vein graft. Mild proximal vein graft stenosis, unchanged from last cath. Large dominant RCA with proximal stented segment with severe in stent restenosis. The mid and distal RCA is heavily calcified with mild disease. The small caliber PDA has  severe stenosis (too small for PCI) LVEDP 20 mmHg Recommendations: ASA and Plavix for at least six months. Same day post PCI discharge. Aggressive risk factor modification.    Disposition   Pt is being discharged home today in good condition.  Follow-up Plans & Appointments        Discharge Medications   Allergies as of 09/28/2023       Reactions   Codeine Nausea Only, Other (See Comments)   Cannot take on empty stomach.   Valium Nausea Only, Other (See Comments)   Cannot take on empty stomach.   Semaglutide    abdominal pain   Simvastatin Other (See Comments)   Unknown reaction    Sitagliptin Other (See Comments)   Unknown reaction    Empagliflozin Palpitations        Medication List     TAKE these medications    amLODipine 10 MG tablet Commonly known as: NORVASC TAKE ONE-HALF TABLET BY MOUTH  DAILY   aspirin 81 MG tablet Take 81 mg by mouth daily.   clopidogrel 75 MG tablet Commonly known as: Plavix Take 1 tablet (75 mg total) by mouth daily.   glipiZIDE 5 MG 24 hr tablet Commonly known as: GLUCOTROL XL Take 5 mg by mouth daily.   Lantus 100 UNIT/ML injection Generic drug: insulin glargine Inject 22 Units into the skin daily.   metoprolol succinate 25 MG 24 hr tablet Commonly known as: Toprol XL Take 0.5 tablets (12.5 mg total) by mouth daily.   nitroGLYCERIN 0.4 MG SL tablet Commonly known as: NITROSTAT Place 0.4 mg under the tongue every 5 (five) minutes as needed for chest pain.   OneTouch Ultra test strip Generic drug: glucose blood CHECK BLOOD SUGAR 3 TIMES A  DAY OR AS DIRECTED   pantoprazole 40 MG tablet Commonly known as: PROTONIX TAKE 1 TABLET BY MOUTH DAILY   rosuvastatin 20 MG tablet Commonly known as: CRESTOR Take 1 tablet (20 mg total) by mouth daily.   UltiCare Mini Pen Needles 31G X 6 MM Misc Generic drug: Insulin Pen Needle USE AS DIRECTED           Allergies Allergies  Allergen Reactions   Codeine Nausea Only and Other (See Comments)    Cannot take on empty  stomach.   Valium Nausea Only and Other (See Comments)    Cannot take on empty stomach.   Semaglutide     abdominal pain   Simvastatin Other (See Comments)    Unknown reaction    Sitagliptin Other (See Comments)    Unknown reaction    Empagliflozin Palpitations    Outstanding Labs/Studies   N/a  Duration of Discharge Encounter   Greater than 30 minutes including physician time.  Signed, Laverda Page, NP 09/28/2023, 1:46 PM

## 2023-09-28 NOTE — Discharge Instructions (Signed)
 Radial Site Care  This sheet gives you information about how to care for yourself after your procedure. Your health care provider may also give you more specific instructions. If you have problems or questions, contact your health care provider. What can I expect after the procedure? After the procedure, it is common to have: Bruising and tenderness at the catheter insertion area. Follow these instructions at home: Medicines Take over-the-counter and prescription medicines only as told by your health care provider. Insertion site care Follow instructions from your health care provider about how to take care of your insertion site. Make sure you: Wash your hands with soap and water before you remove your bandage (dressing). If soap and water are not available, use hand sanitizer. May remove dressing in 24 hours. Check your insertion site every day for signs of infection. Check for: Redness, swelling, or pain. Fluid or blood. Pus or a bad smell. Warmth. Do no take baths, swim, or use a hot tub for 5 days. You may shower 24-48 hours after the procedure. Remove the dressing and gently wash the site with plain soap and water. Pat the area dry with a clean towel. Do not rub the site. That could cause bleeding. Do not apply powder or lotion to the site. Activity  For 24 hours after the procedure, or as directed by your health care provider: Do not flex or bend the affected arm. Do not push or pull heavy objects with the affected arm. Do not drive yourself home from the hospital or clinic. You may drive 24 hours after the procedure. Do not operate machinery or power tools. KEEP ARM ELEVATED THE REMAINDER OF THE DAY. Do not push, pull or lift anything that is heavier than 10 lb for 5 days. Ask your health care provider when it is okay to: Return to work or school. Resume usual physical activities or sports. Resume sexual activity. General instructions If the catheter site starts to  bleed, raise your arm and put firm pressure on the site. If the bleeding does not stop, get help right away. This is a medical emergency. DRINK PLENTY OF FLUIDS FOR THE NEXT 2-3 DAYS. No alcohol consumption for 24 hours after receiving sedation. If you went home on the same day as your procedure, a responsible adult should be with you for the first 24 hours after you arrive home. Keep all follow-up visits as told by your health care provider. This is important. Contact a health care provider if: You have a fever. You have redness, swelling, or yellow drainage around your insertion site. Get help right away if: You have unusual pain at the radial site. The catheter insertion area swells very fast. The insertion area is bleeding, and the bleeding does not stop when you hold steady pressure on the area. Your arm or hand becomes pale, cool, tingly, or numb. These symptoms may represent a serious problem that is an emergency. Do not wait to see if the symptoms will go away. Get medical help right away. Call your local emergency services (911 in the [place] Do not drive yourself to the hospital. Summary After the procedure, it is common to have bruising and tenderness at the site. Follow instructions from your health care provider about how to take care of your radial site wound. Check the wound every day for signs of infection.  This information is not intended to replace advice given to you by your health care provider. Make sure you discuss any questions you have with  your health care provider. Document Revised: 08/03/2017 Document Reviewed: 08/03/2017 Elsevier Patient Education  2020 [employer].

## 2023-09-29 ENCOUNTER — Encounter (HOSPITAL_COMMUNITY): Payer: Self-pay | Admitting: Cardiovascular Disease

## 2023-10-13 ENCOUNTER — Other Ambulatory Visit: Payer: Self-pay | Admitting: Cardiovascular Disease

## 2023-10-13 NOTE — Progress Notes (Signed)
 Cardiology Office Note    Date:  10/16/2023  ID:  Gregary, Blackard 08-03-1945, MRN [id number] PCP:  [name], [name], MD  Cardiologist:  [name], MD  Electrophysiologist:  None   Chief Complaint: Follow up for CAD   History of Present Illness: .    KELIN BORUM is a 78 y.o. male with visit-pertinent history of CAD s/p four-vessel bypass surgery in 2006 with LIMA to LAD, SVG x 3 to diagonal, OM and PDA.  In 2015 received a BMS to proximal RCA.  He underwent repeat cardiac catheterization in 08/2017 that showed a long segment of severe stenosis in the SVG to OM, chronically occluded circumflex, patent LIMA to occluded LAD proximal RCA stent with 80% stenosis in the distal branch of the RCA.  On September 09, 2017 he underwent revascularization of the SVG to OM with 2 DES.  Patient was unable to tolerate Brilinta was switched to Plavix.  He has history of EVAR for a AAA and received a stent and a large right common iliac artery aneurysm, monitored by [facility].  Patient also has history of diabetes, hypertension and hyperlipidemia.  Patient was last seen in clinic by [name] on 12/29/2022.  Patient reported 2 or 3 episodes of "burning" in his throat and shoulders that occurred at rest.  It was noted the burning sensation was similar to his previous description of his angina.  He denied experiencing this with physical exertion.  Patient was seen in clinic on 09/15/2023 reporting episodes of burning sensation that started in throat and traveled to bilateral shoulders and into his chest.  He noted it was slightly similar to his prior anginal equivalent.  Given that patient had repeated episodes during exertion it was recommended that he undergo heart catheterization.  Cardiac catheterization on 09/28/2023 indicated proximal RCA lesion that was 99% stenosed, RPDA lesion 90% stenosed, RPA Vusion 50% stenosed.  Patient has chronic occlusion of the ostial LAD, patent LIMA graft to mid LAD.  2 out of 3 patent  bypass grafts.  Chronic occlusion of proximal circumflex, patent vein graft to obtuse marginal branch with patent stents in the proximal and mid body of the vein graft.  Large dominant RCA with proximal stent segment with severe in-stent restenosis, mid and distal RCA was heavily calcified with mild disease a DES was successfully placed using a stent SYS Synergy XD 4.0 x 28.   Today he presents for follow-up.  He reports that he is doing very well overall.  He reports that he has not had any further burning sensation since his heart catheterization and stenting.  He denies any other chest pain, shortness of breath, lower extremity edema, orthopnea or PND.  He notes that he is overall resumed his normal activity and is doing very well.  He denies any cardiac concerns or complaints today.  ROS: .   Today he denies chest pain, shortness of breath, lower extremity edema, fatigue, palpitations, melena, hematuria, hemoptysis, diaphoresis, weakness, presyncope, syncope, orthopnea, and PND.  All other systems are reviewed and otherwise negative. Studies Reviewed: [name]    EKG:  EKG is ordered today, personally reviewed, demonstrating  EKG Interpretation Date/Time:  Friday October 14 2023 15:18:17 EDT Ventricular Rate:  64 PR Interval:  156 QRS Duration:  88 QT Interval:  456 QTC Calculation: 470 R Axis:   -60  Text Interpretation: Normal sinus rhythm Left axis deviation Inferior infarct (cited on or before 15-Sep-2023) Anterior infarct (cited on or before 15-Sep-2023) When  compared with ECG of 15-Sep-2023 13:07, Inverted T waves have replaced nonspecific T wave abnormality in Inferior leads Confirmed by Reather Littler [id number]) on 10/16/2023 12:58:28 PM   CV Studies: Cardiac studies reviewed are outlined and summarized above. Otherwise please see EMR for full report. Cardiac Studies & Procedures   ______________________________________________________________________________________________ CARDIAC  CATHETERIZATION  CARDIAC CATHETERIZATION 09/28/2023  Narrative   Prox RCA lesion is 99% stenosed.   RPDA lesion is 90% stenosed.   RPAV lesion is 50% stenosed.   Dist RCA lesion is 30% stenosed.   Ost LAD to Prox LAD lesion is 100% stenosed.   Ost Cx to Prox Cx lesion is 100% stenosed.   Origin lesion is 30% stenosed.   Previously placed Prox Graft stent of unknown type is  widely patent.   Previously placed Mid Graft stent of unknown type is  widely patent.   A drug-eluting stent was successfully placed using a STENT SYS SYNERGY XD 4.0X28.   Post intervention, there is a 0% residual stenosis.   LIMA graft was visualized by angiography and is normal in caliber.   SVG graft was visualized by angiography and is normal in caliber.  Severe three vessel CAD s/p 3V CABG with 2/3 patent bypass grafts Chronic occlusion of the ostial LAD (not injected today). Patent LIMA graft to the mid LAD. Chronic occlusion of the proximal Circumflex (not injected today). Patent vein graft to the obtuse marginal branch with patent stents in the proximal and mid body of the vein graft. Mild proximal vein graft stenosis, unchanged from last cath. Large dominant RCA with proximal stented segment with severe in stent restenosis. The mid and distal RCA is heavily calcified with mild disease. The small caliber PDA has  severe stenosis (too small for PCI) LVEDP 20 mmHg  Recommendations: ASA and Plavix for at least six months. Same day post PCI discharge. Aggressive risk factor modification.  Findings Coronary Findings Diagnostic  Dominance: Right  Left Anterior Descending Vessel is large. Ost LAD to Prox LAD lesion is 100% stenosed.  Left Circumflex Vessel is large. Ost Cx to Prox Cx lesion is 100% stenosed. The lesion is chronically occluded.  Right Coronary Artery Vessel is large. Prox RCA lesion is 99% stenosed. The lesion was previously treated using a bare metal stent over 2 years ago. Dist RCA  lesion is 30% stenosed.  Right Posterior Descending Artery Vessel is small in size. RPDA lesion is 90% stenosed.  Right Posterior Atrioventricular Artery RPAV lesion is 50% stenosed.  LIMA LIMA Graft To Dist LAD LIMA graft was visualized by angiography and is normal in caliber.  Saphenous Graft To 2nd Mrg SVG graft was visualized by angiography and is normal in caliber. Origin lesion is 30% stenosed. Previously placed Prox Graft stent of unknown type is  widely patent. Previously placed Mid Graft stent of unknown type is  widely patent.  Intervention  Prox RCA lesion Stent CATH LAUNCHER 6FR AL.75 guide catheter was inserted. Lesion crossed with guidewire using a WIRE ASAHI PROWATER 180CM. Pre-stent angioplasty was performed using a BALLN [place] EMERGE MR 4.0X15. A drug-eluting stent was successfully placed using a STENT SYS SYNERGY XD 4.0X28. Post-stent angioplasty was performed using a BALLN [place] EMERGE MR 5.0X15. Post-Intervention Lesion Assessment The intervention was successful. Pre-interventional TIMI flow is 3. Post-intervention TIMI flow is 3. No complications occurred at this lesion. There is a 0% residual stenosis post intervention.   CARDIAC CATHETERIZATION  CARDIAC CATHETERIZATION 09/09/2017  Narrative 1. Unstable angina secondary to severe stenosis  in the proximal and mid body of the SVG to the OM 2. Successful PTCA/DES x 1 mid body of SVG to OM 3. Successful PTCA/DES x 1 proximal body of SVG to OM  Recommendations: Continue DAPT for lifetime. Probable discharge home tomorrow.  Findings Coronary Findings Diagnostic  Dominance: Right  No diagnostic findings have been documented. Intervention  No interventions have been documented.   STRESS TESTS  MYOCARDIAL PERFUSION IMAGING 05/31/2017  Narrative  The left ventricular ejection fraction is mildly decreased (45-54%).  Nuclear stress EF: 49%.  Horizontal ST segment depression ST segment depression of 1 mm  was noted during stress in the V2, V4, aVF, II, III and V3 leads.  Defect 1: There is a medium defect of moderate severity present in the mid anterior, mid anterolateral and apical anterior location.  This is an intermediate risk study.  Intermediate risk study with ischemia in the territory of a large diagonal or ramus intermedius artery and mildly depressed left ventricular systolic function. This abnormality may correspond to the known occlusion of the proximal limb of the sequential SVG-diagonal and OM bypass graft (Cardiac cath 2015).      MONITORS  LONG TERM MONITOR (3-14 DAYS) 01/26/2023  Narrative   The dominant rhythm is normal sinus rhythm with normal circadian variation.   There is no evidence of severe bradycardia, significant pauses or episodes of high-grade AV block.   There are frequent brief episodes of nonsustained ectopic atrial tachycardia (regular narrow complex tachycardia with abrupt onset, distinct P waves and long RP mechanism), maximum 18 seconds in duration.  There is no evidence of atrial fibrillation.   There are rare isolated premature ventricular beats.  Abnormal event monitor due to brief episodes of nonsustained ectopic atrial tachycardia, lasting a maximum of 18 seconds in duration. Atrial fibrillation was not recorded.  Patch Wear Time:  13 days and 15 hours (2024-06-25T18:46:40-0400 to 2024-07-09T10:04:45-0400)  Patient had a min HR of 42 bpm, max HR of 176 bpm, and avg HR of 68 bpm. Predominant underlying rhythm was Sinus Rhythm. 74 Supraventricular Tachycardia runs occurred, the run with the fastest interval lasting 5 beats with a max rate of 176 bpm, the longest lasting 17.9 secs with an avg rate of 114 bpm. Isolated SVEs were rare (<1.0%), SVE Couplets were rare (<1.0%), and SVE Triplets were rare (<1.0%). Isolated VEs were rare (<1.0%, 5457), VE Couplets were rare (<1.0%, 61), and VE Triplets were rare (<1.0%, 2). Ventricular Bigeminy was present.        ______________________________________________________________________________________________       Current Reported Medications:.    No outpatient medications have been marked as taking for the 10/14/23 encounter (Office Visit) with Rip Harbour, NP.    Physical Exam:    VS:  BP 123/66   Pulse 64   Ht 5\' 9"  (1.753 m)   Wt 165 lb (74.8 kg)   SpO2 97%   BMI 24.37 kg/m    Wt Readings from Last 3 Encounters:  10/14/23 165 lb (74.8 kg)  09/28/23 165 lb (74.8 kg)  09/15/23 165 lb (74.8 kg)    GEN: Well nourished, well developed in no acute distress NECK: No JVD; No carotid bruits CARDIAC: RRR, no murmurs, rubs, gallops, left wrist cast site clean and intact without evidence of hematoma RESPIRATORY:  Clear to auscultation without rales, wheezing or rhonchi  ABDOMEN: Soft, non-tender, non-distended EXTREMITIES:  No edema; No acute deformity     Asessement and Plan:.    CAD: S/p CABG x  4 in 2006 with LIMA to LAD, SVG x 3 to diagonal, OM and PDA.  In 2015 he received a BMS to proximal RCA.  In 2019 he underwent revascularization of the SVG to OM with 2 DES.  Cardiac catheterization on 09/28/2023 showed 2 out of 3 patent bypass grafts, patient had proximal RCA lesion that was 99% stenosis, in-stent restenosis treated with SYS Synergy XD DES 4.0 x 28.  Today patient reports that he is doing very well, denies any further burning sensation or anginal equivalent.  He denies chest pain, shortness of breath, lower extremity edema. Heart healthy diet and regular cardiovascular exercise encouraged.  Reviewed ED precautions. Continue amlodipine 10 mg daily, aspirin 81 mg daily, Plavix 75 mg daily, metoprolol succinate 12.5 mg daily and Crestor 20 mg daily.  CHF: Per Dr. Erin Hearing notes patient had transient reduction in the left ventricular systolic function related to ischemia, his last echocardiogram showed normal ventricular function. Today patient appears euvolemic well compensated on  exam.  He denies shortness of breath, lower extremity edema, orthopnea or PND.  Reviewed ED precautions.  Hypertension: Blood pressure today 123/66.  Continue current antihypertensive regimen.  PAD: Patient with history of EVAR stent graft for aortic iliac aneurysm in 2011, followed by vascular surgery.  Patient denies any symptoms of claudication.  HLP: Last lipid profile in 12/27/2022 indicated total cholesterol 153, HDL 34, triglycerides 171 and LDL 89.  Check fasting lipid profile and LFTs.    Cardiac Rehabilitation Eligibility Assessment  The patient is ready to start cardiac rehabilitation from a cardiac standpoint.    Disposition: F/u with [name] in 3 months or sooner if needed.   Signed, Rip Harbour, NP

## 2023-10-14 ENCOUNTER — Encounter: Payer: Self-pay | Admitting: Cardiology

## 2023-10-14 ENCOUNTER — Ambulatory Visit: Attending: Cardiology | Admitting: Cardiology

## 2023-10-14 VITALS — BP 123/66 | HR 64 | Ht 69.0 in | Wt 165.0 lb

## 2023-10-14 DIAGNOSIS — I739 Peripheral vascular disease, unspecified: Secondary | ICD-10-CM | POA: Diagnosis not present

## 2023-10-14 DIAGNOSIS — E782 Mixed hyperlipidemia: Secondary | ICD-10-CM

## 2023-10-14 DIAGNOSIS — I25810 Atherosclerosis of coronary artery bypass graft(s) without angina pectoris: Secondary | ICD-10-CM

## 2023-10-14 DIAGNOSIS — Z955 Presence of coronary angioplasty implant and graft: Secondary | ICD-10-CM

## 2023-10-14 DIAGNOSIS — E785 Hyperlipidemia, unspecified: Secondary | ICD-10-CM

## 2023-10-14 DIAGNOSIS — I5032 Chronic diastolic (congestive) heart failure: Secondary | ICD-10-CM | POA: Diagnosis not present

## 2023-10-14 DIAGNOSIS — I10 Essential (primary) hypertension: Secondary | ICD-10-CM | POA: Diagnosis not present

## 2023-10-14 MED ORDER — CLOPIDOGREL BISULFATE 75 MG PO TABS: 75.0000 mg | ORAL_TABLET | Freq: Every day | ORAL | 3 refills | Status: DC

## 2023-10-14 NOTE — Patient Instructions (Signed)
   Follow-Up: At [facility], you and your health needs are our priority.  As part of our continuing mission to provide you with exceptional heart care, our providers are all part of one team.  This team includes your primary Cardiologist (physician) and Advanced Practice Providers or APPs (Physician Assistants and Nurse Practitioners) who all work together to provide you with the care you need, when you need it.  Your next appointment:   JULY  Provider:   [name], MD     We recommend signing up for the patient portal called "MyChart".  Sign up information is provided on this After Visit Summary.  MyChart is used to connect with patients for Virtual Visits (Telemedicine).  Patients are able to view lab/test results, encounter notes, upcoming appointments, etc.  Non-urgent messages can be sent to your provider as well.   To learn more about what you can do with MyChart, go to [email or web address].         1st Floor: - Lobby - Registration  - Pharmacy  - Lab - Cafe  2nd Floor: - PV Lab - Diagnostic Testing (echo, CT, nuclear med)  3rd Floor: - Vacant  4th Floor: - TCTS (cardiothoracic surgery) - AFib Clinic - Structural Heart Clinic - Vascular Surgery  - Vascular Ultrasound  5th Floor: - HeartCare Cardiology (general and EP) - Clinical Pharmacy for coumadin, hypertension, lipid, weight-loss medications, and med management appointments    Valet parking services will be available as well.

## 2023-10-16 ENCOUNTER — Encounter: Payer: Self-pay | Admitting: Cardiology

## 2023-10-17 DIAGNOSIS — E782 Mixed hyperlipidemia: Secondary | ICD-10-CM | POA: Diagnosis not present

## 2023-10-17 LAB — LIPID PANEL
Chol/HDL Ratio: 4.6 ratio (ref 0.0–5.0)
Cholesterol, Total: 133 mg/dL (ref 100–199)
HDL: 29 mg/dL — ABNORMAL LOW (ref >39)
LDL Chol Calc (NIH): 71 mg/dL (ref 0–99)
Triglycerides: 195 mg/dL — ABNORMAL HIGH (ref 0–149)
VLDL Cholesterol Cal: 33 mg/dL (ref 5–40)

## 2023-10-17 LAB — HEPATIC FUNCTION PANEL
ALT: 10 IU/L (ref 0–44)
AST: 35 IU/L (ref 0–40)
Albumin: 4.4 g/dL (ref 3.8–4.8)
Alkaline Phosphatase: 97 IU/L (ref 44–121)
Bilirubin Total: 0.7 mg/dL (ref 0.0–1.2)
Bilirubin, Direct: 0.23 mg/dL (ref 0.00–0.40)
Total Protein: 6.8 g/dL (ref 6.0–8.5)

## 2023-10-24 ENCOUNTER — Telehealth: Payer: Self-pay

## 2023-10-24 DIAGNOSIS — I25810 Atherosclerosis of coronary artery bypass graft(s) without angina pectoris: Secondary | ICD-10-CM

## 2023-10-24 DIAGNOSIS — I5032 Chronic diastolic (congestive) heart failure: Secondary | ICD-10-CM

## 2023-10-24 DIAGNOSIS — Z955 Presence of coronary angioplasty implant and graft: Secondary | ICD-10-CM

## 2023-10-24 MED ORDER — ROSUVASTATIN CALCIUM 40 MG PO TABS: 40.0000 mg | ORAL_TABLET | Freq: Every day | ORAL | 3 refills | Status: DC

## 2023-10-24 NOTE — Telephone Encounter (Signed)
-----   Message from Jude Norton sent at 10/18/2023  3:13 PM EDT ----- Covering Katlyn's inbox.  Recent labs show cholesterol remains slightly elevated above goal, LDL goal is less than 70. LDL was 71 on most recent labs.  Liver enzymes were stable.  If patient would like to try increasing Crestor to 40 mg daily, this would likely achieve the goal of LDL less than 70.  If he wishes to increase Crestor, would recommend repeat fasting lipids, LFTs in 6 to 8 weeks.  If he prefers to wait, can discuss at next follow-up visit.  Thank you-EM

## 2023-10-24 NOTE — Telephone Encounter (Signed)
 Called patient advised of below they verbalized understanding Ordered labs and higher dose of crestor

## 2023-11-08 ENCOUNTER — Other Ambulatory Visit: Payer: Self-pay | Admitting: Cardiovascular Disease

## 2023-11-11 ENCOUNTER — Telehealth: Payer: Self-pay | Admitting: Emergency Medicine

## 2023-11-11 DIAGNOSIS — I739 Peripheral vascular disease, unspecified: Secondary | ICD-10-CM

## 2023-11-11 NOTE — Telephone Encounter (Signed)
 ABI ORDERED   Message Received: 3 days ago Croitoru, [place], MD  [name], [name], MD; Marlon Simpson, RN Hello, Dr. [name] My personal experience with these [facility] ABI assessments is that they are frequently inaccurate. Having said that, he does have a history of PAD and previous EVAR and used to see Dr. Shirley Douglas until his retirement. It is reasonable to get formal ABI, but only get a full Doppler study if the formal ABIs are abnormal. I will actually ask my nurse to go ahead and schedule those (thanks, Katie!) and get the results to you. Thanks for the update, Mihai       Previous Messages    ----- Message ----- From: [name], [name], MD Sent: 11/08/2023   7:05 AM EDT To: Luana Rumple, MD  Good morning Dr Alvis Ba, A few weeks ago I received the result of home health PAD screening,"his left foot was abnormal at 0.53 and right foot at 0.59." He was here with his wife recently (for her appt) and he denied claudication like symptoms.He is on Plavix , aspirin ,and Crestor . I am debating about the need for further work up at this point, do you think I should order ABI or other tests? He has an appt with you in 01/2024. Thanks, Betty [name]

## 2023-11-14 ENCOUNTER — Other Ambulatory Visit: Payer: Self-pay | Admitting: Family Medicine

## 2023-11-14 DIAGNOSIS — I739 Peripheral vascular disease, unspecified: Secondary | ICD-10-CM

## 2023-11-14 NOTE — Telephone Encounter (Signed)
 Discussed with Dr Alvis Ba through community message and it seems like his office has already scheduled ABI for pt for 12/02/23. BJ

## 2023-12-02 ENCOUNTER — Ambulatory Visit (HOSPITAL_COMMUNITY)
Admission: RE | Admit: 2023-12-02 | Discharge: 2023-12-02 | Disposition: A | Discharge status: Home / Self Care | Source: Ambulatory Visit | Attending: Cardiovascular Disease | Admitting: Cardiovascular Disease

## 2023-12-02 DIAGNOSIS — I739 Peripheral vascular disease, unspecified: Secondary | ICD-10-CM | POA: Insufficient documentation

## 2023-12-03 LAB — VAS US ABI WITH/WO TBI
Left ABI: 1.02
Right ABI: 1.16

## 2023-12-05 ENCOUNTER — Ambulatory Visit: Payer: Self-pay | Admitting: Cardiovascular Disease

## 2023-12-06 DIAGNOSIS — E1165 Type 2 diabetes mellitus with hyperglycemia: Secondary | ICD-10-CM | POA: Diagnosis not present

## 2023-12-13 DIAGNOSIS — I129 Hypertensive chronic kidney disease with stage 1 through stage 4 chronic kidney disease, or unspecified chronic kidney disease: Secondary | ICD-10-CM | POA: Diagnosis not present

## 2023-12-13 DIAGNOSIS — I2510 Atherosclerotic heart disease of native coronary artery without angina pectoris: Secondary | ICD-10-CM | POA: Diagnosis not present

## 2023-12-13 DIAGNOSIS — N182 Chronic kidney disease, stage 2 (mild): Secondary | ICD-10-CM | POA: Diagnosis not present

## 2023-12-13 DIAGNOSIS — E1122 Type 2 diabetes mellitus with diabetic chronic kidney disease: Secondary | ICD-10-CM | POA: Diagnosis not present

## 2023-12-13 DIAGNOSIS — E041 Nontoxic single thyroid nodule: Secondary | ICD-10-CM | POA: Diagnosis not present

## 2023-12-13 DIAGNOSIS — E785 Hyperlipidemia, unspecified: Secondary | ICD-10-CM | POA: Diagnosis not present

## 2023-12-13 DIAGNOSIS — E1165 Type 2 diabetes mellitus with hyperglycemia: Secondary | ICD-10-CM | POA: Diagnosis not present

## 2023-12-29 ENCOUNTER — Telehealth: Payer: Self-pay | Admitting: Cardiovascular Disease

## 2023-12-29 ENCOUNTER — Other Ambulatory Visit (HOSPITAL_COMMUNITY): Payer: Self-pay

## 2023-12-29 NOTE — Telephone Encounter (Signed)
*  STAT* If patient is at the pharmacy, call can be transferred to refill team.   1. Which medications need to be refilled? (please list name of each medication and dose if known)   clopidogrel  (PLAVIX ) 75 MG tablet     2. Would you like to learn more about the convenience, safety, & potential cost savings by using the [facility]? No   3. Are you open to using the [facility] (Type [facility].  No  4. Which pharmacy/location (including street and city if local pharmacy) is medication to be sent to? [facility] [place], [place] - [address]     5. Do they need a 30 day or 90 day supply? 90 day    Only has 3-4 tablets left

## 2023-12-29 NOTE — Telephone Encounter (Signed)
 Pt of Dr. Alvis Ba. Mr. [name] wife is requesting a C/B on if they need Lab work done at or before their appt. They travel from [place].

## 2024-01-02 NOTE — Telephone Encounter (Signed)
 Please refill clopidogrel . No labs needed

## 2024-01-04 MED ORDER — CLOPIDOGREL BISULFATE 75 MG PO TABS: 75.0000 mg | ORAL_TABLET | Freq: Every day | ORAL | 3 refills | Status: AC

## 2024-01-04 NOTE — Telephone Encounter (Signed)
 Clopidogrel  Refill send to pharmacy in [place]

## 2024-01-18 DIAGNOSIS — I5032 Chronic diastolic (congestive) heart failure: Secondary | ICD-10-CM | POA: Diagnosis not present

## 2024-01-18 DIAGNOSIS — I25810 Atherosclerosis of coronary artery bypass graft(s) without angina pectoris: Secondary | ICD-10-CM | POA: Diagnosis not present

## 2024-01-18 DIAGNOSIS — Z955 Presence of coronary angioplasty implant and graft: Secondary | ICD-10-CM | POA: Diagnosis not present

## 2024-01-19 ENCOUNTER — Ambulatory Visit: Payer: Self-pay | Admitting: Cardiology

## 2024-01-19 LAB — HEPATIC FUNCTION PANEL
ALT: 10 IU/L (ref 0–44)
AST: 36 IU/L (ref 0–40)
Albumin: 4.5 g/dL (ref 3.8–4.8)
Alkaline Phosphatase: 89 IU/L (ref 44–121)
Bilirubin Total: 0.7 mg/dL (ref 0.0–1.2)
Bilirubin, Direct: 0.25 mg/dL (ref 0.00–0.40)
Total Protein: 6.7 g/dL (ref 6.0–8.5)

## 2024-01-19 LAB — LIPID PANEL
Chol/HDL Ratio: 4.5 ratio (ref 0.0–5.0)
Cholesterol, Total: 130 mg/dL (ref 100–199)
HDL: 29 mg/dL — ABNORMAL LOW (ref >39)
LDL Chol Calc (NIH): 69 mg/dL (ref 0–99)
Triglycerides: 188 mg/dL — ABNORMAL HIGH (ref 0–149)
VLDL Cholesterol Cal: 32 mg/dL (ref 5–40)

## 2024-01-19 NOTE — Telephone Encounter (Signed)
-----   Message from Katlyn D [place] sent at 01/19/2024  7:41 AM EDT ----- Please let Mr. Pollard know that his LDL or bad cholesterol has improved, now at goal. His triglycerides remain slightly elevated however improved from prior results. His liver function is normal.  Overall good results, continue current medications and follow up as planned.  ----- Message ----- From: Rebecka Memos Lab Results In Sent: 01/19/2024   2:35 AM EDT To: Katlyn D West, NP

## 2024-01-19 NOTE — Telephone Encounter (Signed)
No way to leave message

## 2024-01-25 NOTE — Telephone Encounter (Signed)
-----   Message from Katlyn D [place] sent at 01/19/2024  7:41 AM EDT ----- Please let Mr. [name] know that his LDL or bad cholesterol has improved, now at goal. His triglycerides remain slightly elevated however improved from prior results. His liver function is normal.  Overall good results, continue current medications and follow up as planned.  ----- Message ----- From: Rebecka Memos Lab Results In Sent: 01/19/2024   2:35 AM EDT To: Katlyn D West, NP

## 2024-01-25 NOTE — Telephone Encounter (Signed)
 Called patient advised of below they verbalized understanding.

## 2024-01-27 ENCOUNTER — Encounter: Payer: Self-pay | Admitting: Cardiovascular Disease

## 2024-01-27 ENCOUNTER — Ambulatory Visit: Attending: Cardiovascular Disease | Admitting: Cardiovascular Disease

## 2024-01-27 VITALS — BP 118/58 | HR 60 | Ht 68.0 in | Wt 166.2 lb

## 2024-01-27 DIAGNOSIS — I5032 Chronic diastolic (congestive) heart failure: Secondary | ICD-10-CM

## 2024-01-27 DIAGNOSIS — Z794 Long term (current) use of insulin: Secondary | ICD-10-CM | POA: Diagnosis not present

## 2024-01-27 DIAGNOSIS — I739 Peripheral vascular disease, unspecified: Secondary | ICD-10-CM

## 2024-01-27 DIAGNOSIS — E785 Hyperlipidemia, unspecified: Secondary | ICD-10-CM

## 2024-01-27 DIAGNOSIS — I25810 Atherosclerosis of coronary artery bypass graft(s) without angina pectoris: Secondary | ICD-10-CM | POA: Diagnosis not present

## 2024-01-27 DIAGNOSIS — I10 Essential (primary) hypertension: Secondary | ICD-10-CM | POA: Diagnosis not present

## 2024-01-27 DIAGNOSIS — E1140 Type 2 diabetes mellitus with diabetic neuropathy, unspecified: Secondary | ICD-10-CM | POA: Diagnosis not present

## 2024-01-27 NOTE — Progress Notes (Signed)
 Patient ID: [name], male   DOB: 12-Jul-1946, 78 y.o.   MRN: [id number]    Cardiology Office Note    Date:  01/29/2024   ID:  [name], [name] September 17, 1945, MRN [id number]  PCP:  [name], [name], MD  Cardiologist:   Jerel Balding, MD   Chief Complaint  Patient presents with   Coronary Artery Disease    History of Present Illness:  [name] is a 78 y.o. male with a long-standing history of coronary artery disease who returns for follow-up.  As always, he is accompanied by his wife [name], who is also my patient.   He underwent coronary artery catheterization for recurrent angina pectoris 09/28/2023.  He was found to have 2 out of 3 patent bypass grafts (known occlusion of the ostial LAD and proximal circumflex, previously stented right coronary artery; patent LIMA-LAD and patent and previously stented SVG-OM, chronically occluded SVG-PDA) and a new 99% in-stent restenosis in the proximal right coronary artery where he received a new drug-eluting stent (Synergy XD 4.0 x 28).  He is also found to have a 90% stenosis in the PDA, too small for PCI.  Feels much better now and is no longer having any issues with chest discomfort, the throat burning which is his typical angina, shortness of breath at rest or with activity.  He has easy bruising but has not had any serious bleeding problems while on dual antiplatelet therapy with aspirin  and clopidogrel .  Glycemic control remains fair with a hemoglobin A1c of 7.4% and he has a good HDL cholesterol at 69 but with a chronically low HDL cholesterol which is only 29.  On rosuvastatin .  He had side effects to multiple newer diabetes medications (semaglutide caused abdominal pain, empagliflozin  caused worsened palpitations).  On treatment with glipizide and Lantus , seeing Dr. Eileen.    He last had ultrasound for his abdominal aortic aneurysm/iliac artery aneurysm in July 2022 (he was then told by his vascular surgeon that he no longer needs  follow-up). EVAR stent remains patent, without endoleak, maximum diameter 2.1 cm.  Lower extremities had normal ABIs bilaterally in 2019.  He saw Dr. Jenel to evaluate numerous atypical neurological complaints including memory deficit.  His MRI showed a moderate level of small vessel disease and atrophy which could explain his memory difficulties.  There was much more pronounced atrophy in the frontal and temporal insular region especially on the left side, raising the possibility that he could have frontotemporal dementia or Lewy body disease.    March 08, 2019 FNA of thyroid  nodule showed benign findings.  He underwent four-vessel bypass surgery in 2006 (LIMA-LAD, SVG x 3 to Diagonal, OM and PDA). In 2015 received a bare metal stent to the proximal RCA (4.5 mm vessel). On September 08, 2017 he underwent cardiac catheterization which showed a long segment of severe stenosis in the SVG to OM (chronically occluded circumflex), patent LIMA to occluded LAD proximal RCA stent with 80% stenosis in the distal branch of the RCA.  September 09, 2017 he underwent revascularization of the SVG-OM with 2 drug-eluting stents.  He did not tolerate Brilinta  and asked to be switched to clopidogrel .  In March 2025 he received a new 4.0 x 28 drug-eluting stent in the proximal RCA for restenosis.  He has had EVAR for AAA amd received a stent in the large right common iliac artery aneurysm and is monitored at [facility].  He has insulin  requiring diabetes mellitus. He has chronically low HDL cholesterol.  Past Medical History:  Diagnosis Date   Anxiety    Arthritis    back (04/05/2014)   Atrial fibrillation (HCC)    Blood in stool    Bradycardia- with decrease of BB some improvement 09/10/2017   CAD (coronary artery disease)    CAD s/p CABG 2006 08/16/2013   a. Dr Obadiah - to LAD, SVG to diagonal, SVG to OM, SVG to PDA ;  b.  [facility] (9/15): ostial LAD occl, ostial CFX 60%, prox AVCFX 95%, RCA 95%, S-PDA occl, S-OM/Dx with Dx limb  occl and prox 60%, L-LAD patent, EF 35-40% wtih ant-lat and apical HK-AK >> PCI:  BMS to native RCA    Diverticulosis    Dizziness    DM type 2 causing complication (HCC) 08/16/2013   GERD (gastroesophageal reflux disease)    Hypertension    Iliac artery aneurysm (HCC)    Ischemic cardiomyopathy    a. EF 35-40% by LV gram 03/2014   Joint pain    Kidney stones    Kidney stones    Lower back pain Jan 2015   Mixed hyperlipidemia 08/16/2013   Myocardial infarction (HCC) 03/2014 X 2?   S/P angioplasty with stent DES mid SVG to OM and DES to prox. SVG to OM, DAPT for lifetime 09/10/2017   Type II diabetes mellitus [facility])     Past Surgical History:  Procedure Laterality Date   CARDIAC CATHETERIZATION  2006   before [facility]   CORONARY ANGIOPLASTY WITH STENT PLACEMENT  04/05/2014   1   CORONARY ARTERY BYPASS GRAFT  2006   LIMA to LAD,SVG to diagonal,SVG to obtuse marginal,SVG to posterior descending   CORONARY STENT INTERVENTION N/A 09/09/2017   Procedure: CORONARY STENT INTERVENTION;  Surgeon: Verlin Lonni BIRCH, MD;  Location: [facility];  Service: Cardiovascular;  Laterality: N/A;   CORONARY STENT INTERVENTION N/A 09/28/2023   Procedure: CORONARY STENT INTERVENTION;  Surgeon: Verlin Lonni BIRCH, MD;  Location: [facility];  Service: Cardiovascular;  Laterality: N/A;   CORONARY STENT PLACEMENT  09/09/2017   CYSTOSCOPY W/ STONE MANIPULATION  1970's X 2   CYSTOSCOPY W/ URETEROSCOPY W/ LITHOTRIPSY  ~ 2009   ILIAC ARTERY STENT Left Aug. 10, 2011   Aorta to right ext iliac and left CIA stent repair    LEFT HEART CATH AND CORS/GRAFTS ANGIOGRAPHY N/A 09/08/2017   Procedure: LEFT HEART CATH AND CORS/GRAFTS ANGIOGRAPHY;  Surgeon: Burnard Debby LABOR, MD;  Location: [facility];  Service: Cardiovascular;  Laterality: N/A;   LEFT HEART CATH AND CORS/GRAFTS ANGIOGRAPHY N/A 09/28/2023   Procedure: LEFT HEART CATH AND CORS/GRAFTS ANGIOGRAPHY;  Surgeon: Verlin Lonni BIRCH, MD;   Location: [facility];  Service: Cardiovascular;  Laterality: N/A;   LEFT HEART CATHETERIZATION WITH CORONARY/GRAFT ANGIOGRAM N/A 04/05/2014   Procedure: LEFT HEART CATHETERIZATION WITH EL BILE;  Surgeon: Debby LABOR Burnard, MD;  Location: [facility] CATH LAB;  Service: Cardiovascular;  Laterality: N/A;   NM MYOCAR PERF WALL MOTION  11/18/2009   low risk   TONSILLECTOMY AND ADENOIDECTOMY  1951    Outpatient Medications Prior to Visit  Medication Sig Dispense Refill   amLODipine  (NORVASC ) 10 MG tablet TAKE ONE-HALF TABLET BY MOUTH  DAILY 45 tablet 3   aspirin  81 MG tablet Take 81 mg by mouth daily.     Blood Glucose Monitoring Suppl (CONTOUR NEXT GEN MONITOR) DEVI SMARTSIG:1-3 Times Daily     clopidogrel  (PLAVIX ) 75 MG tablet Take 1 tablet (75 mg total) by mouth  daily. 90 tablet 3   glipiZIDE (GLUCOTROL XL) 5 MG 24 hr tablet Take 5 mg by mouth daily.     insulin  glargine (LANTUS ) 100 UNIT/ML injection Inject 22 Units into the skin daily.     metoprolol  succinate (TOPROL -XL) 25 MG 24 hr tablet TAKE ONE-HALF TABLET BY MOUTH  DAILY 45 tablet 3   ONETOUCH ULTRA test strip CHECK BLOOD SUGAR 3 TIMES A DAY OR AS DIRECTED 100 strip 0   pantoprazole  (PROTONIX ) 40 MG tablet TAKE 1 TABLET BY MOUTH DAILY 100 tablet 2   rosuvastatin  (CRESTOR ) 40 MG tablet Take 1 tablet (40 mg total) by mouth daily. 90 tablet 3   ULTICARE MINI PEN NEEDLES 31G X 6 MM MISC USE AS DIRECTED 100 each 1   nitroGLYCERIN  (NITROSTAT ) 0.4 MG SL tablet Place 0.4 mg under the tongue every 5 (five) minutes as needed for chest pain. (Patient not taking: Reported on 01/27/2024)     No facility-administered medications prior to visit.     Allergies:   Codeine, Valium, Semaglutide, Simvastatin , Sitagliptin, and Empagliflozin    Family History:  The patient's family history includes Diabetes in his brother and mother; Heart disease in his mother, sister, and son; Hypertension in his mother.   PHYSICAL EXAM:   VS:  BP (!)  118/58 (BP Location: Left Arm, Patient Position: Sitting, Cuff Size: Normal)   Pulse 60   Ht 5' 8 (1.727 m)   Wt 166 lb 3.2 oz (75.4 kg)   SpO2 98%   BMI 25.27 kg/m      General: Alert, oriented x3, no distress, quite lean Head: no evidence of trauma, PERRL, EOMI, no exophtalmos or lid lag, no myxedema, no xanthelasma; normal ears, nose and oropharynx Neck: normal jugular venous pulsations and no hepatojugular reflux; brisk carotid pulses without delay and no carotid bruits Chest: clear to auscultation, no signs of consolidation by percussion or palpation, normal fremitus, symmetrical and full respiratory excursions Cardiovascular: normal position and quality of the apical impulse, regular rhythm, normal first and second heart sounds, no murmurs, rubs or gallops Abdomen: no tenderness or distention, no masses by palpation, no abnormal pulsatility or arterial bruits, normal bowel sounds, no hepatosplenomegaly Extremities: no clubbing, cyanosis or edema; 2+ radial, ulnar and brachial pulses bilaterally; 2+ right femoral, posterior tibial and dorsalis pedis pulses; 2+ left femoral, posterior tibial and dorsalis pedis pulses; no subclavian or femoral bruits Neurological: grossly nonfocal Psych: Normal mood and affect\    Wt Readings from Last 3 Encounters:  01/27/24 166 lb 3.2 oz (75.4 kg)  10/14/23 165 lb (74.8 kg)  09/28/23 165 lb (74.8 kg)      Studies/Labs Reviewed:   EKG:  EKG is not ordered today.  Personally reviewed the tracing from 10/14/2023 shows normal sinus rhythm, left axis deviation, left anterior fascicular block, inverted T waves in leads III and aVF.  Not exclude old inferior MI  EKG Interpretation Date/Time:    Ventricular Rate:    PR Interval:    QRS Duration:    QT Interval:    QTC Calculation:   R Axis:      Text Interpretation:         Cardiac catheterization 09/28/2023   Intervention     Stent Sys Synergy Xd 4.0x28  Recent Labs: BMET     Component Value Date/Time   NA 138 09/15/2023 1421   K 3.9 09/15/2023 1421   CL 101 09/15/2023 1421   CO2 23 09/15/2023 1421   GLUCOSE 241 (H) 09/15/2023  1421   GLUCOSE 244 (H) 07/19/2023 1400   BUN 14 09/15/2023 1421   CREATININE 1.19 09/15/2023 1421   CREATININE 1.19 (H) 04/11/2020 1049   CALCIUM  9.0 09/15/2023 1421   GFRNONAA 60 04/11/2020 1049   GFRAA 69 04/11/2020 1049     Lipid Panel     Component Value Date/Time   CHOL 130 01/18/2024 0933   TRIG 188 (H) 01/18/2024 0933   HDL 29 (L) 01/18/2024 0933   CHOLHDL 4.5 01/18/2024 0933   CHOLHDL 4.6 04/11/2020 1049   VLDL 53.0 (H) 12/28/2016 0936   LDLCALC 69 01/18/2024 0933   LDLCALC 86 04/11/2020 1049   LDLDIRECT 74.0 12/28/2016 0936   CBC:    Component Value Date/Time   WBC 7.0 09/15/2023 1421   WBC 7.5 07/19/2023 1400   HGB 14.2 09/15/2023 1421   HCT 42.2 09/15/2023 1421   PLT 218 09/15/2023 1421   MCV 85 09/15/2023 1421   NEUTROABS 4.7 07/19/2023 1400   LYMPHSABS 2.0 07/19/2023 1400   MONOABS 0.6 07/19/2023 1400   EOSABS 0.1 07/19/2023 1400   BASOSABS 0.1 07/19/2023 1400     ASSESSMENT:    1. 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   2. Chronic diastolic heart failure (HCC)   3. Essential hypertension   4. PAD (peripheral artery disease) (HCC)   5. Dyslipidemia (high LDL; low HDL)   6. Type 2 diabetes mellitus with diabetic neuropathy, with long-term current use of insulin  (HCC)       PLAN:  In order of problems listed above:  CAD: Had recurrent angina in early 2025 and was found to have a new stenosis at the level of a previously placed stent in the right coronary artery, treated again with a drug-eluting stent.  This is the first time he is described symptoms suggestive of angina pectoris since his last PCI (DES x2 to SVG-OM) in March 2019.  Plan to continue dual antiplatelet therapy through March 2026, although since the stent procedure was  elective, we could discontinue the clopidogrel  sooner if necessary. CHF: Clinically euvolemic, NYHA functional class I, has shown evidence of transient reduction in LVEF due to coronary stenoses in the past, with subsequent recovery after revascularization.  HTN: Well-controlled.  Not on beta-blockers due to bradycardia.  On ARB for renal protection in the setting of diabetes mellitus on insulin , amlodipine  may have benefit as antianginal. PAD: Asymptomatic.  He does not have claudication.  History of EVAR stent graft for aorto-iliac aneurysm in 2011, being followed in the vascular surgery clinic.  Favorable findings on last scan in July 2022.  HLP: LDL in target range less than 70, but HDL is chronically low. DM: Adequate control with A1c 7.4%.  Intolerant to GLP-1 agonist and SGLT2 inhibitors. Cognitive issues: He does have some small vessel disease and a peculiar pattern of atrophy involving the frontotemporal region, but no specific diagnosis is firmly established.  Symptomatically he seems improved, although he still has some agitation.  Brilinta  did seem to make this anxiety worse. Depression: Not sure how it ties in with his cognitive issues, but he clearly has symptoms of depression.  I encouraged him to try taking the trazodone  again, but to take the full 50 mg tablet to see if it helps with his mood and with his sleep.   Medication Adjustments/Labs and Tests Ordered: Current medicines are reviewed at length with the patient today.  Concerns regarding medicines are outlined above.  Medication changes, Labs and  Tests ordered today are listed in the Patient Instructions below. Patient Instructions  Medication Instructions:  No changes *If you need a refill on your cardiac medications before your next appointment, please call your pharmacy*  Lab Work: No orders If you have labs (blood work) drawn today and your tests are completely normal, you will receive your results only by: MyChart  Message (if you have MyChart) OR A paper copy in the mail If you have any lab test that is abnormal or we need to change your treatment, we will call you to review the results.  Testing/Procedures: No orders  Follow-Up: At [facility], you and your health needs are our priority.  As part of our continuing mission to provide you with exceptional heart care, our providers are all part of one team.  This team includes your primary Cardiologist (physician) and Advanced Practice Providers or APPs (Physician Assistants and Nurse Practitioners) who all work together to provide you with the care you need, when you need it.  Your next appointment:    F/U March 2026  Provider:   Jerel Balding, MD    We recommend signing up for the patient portal called MyChart.  Sign up information is provided on this After Visit Summary.  MyChart is used to connect with patients for Virtual Visits (Telemedicine).  Patients are able to view lab/test results, encounter notes, upcoming appointments, etc.  Non-urgent messages can be sent to your provider as well.   To learn more about what you can do with MyChart, go to [email or web address].            Signed, Jerel Balding, MD  01/29/2024 3:16 PM    [facility] [address] [place], [place], [place]  [address] Phone: [phone]; Fax: [phone]

## 2024-01-27 NOTE — Patient Instructions (Signed)
 Medication Instructions:  No changes *If you need a refill on your cardiac medications before your next appointment, please call your pharmacy*  Lab Work: No orders If you have labs (blood work) drawn today and your tests are completely normal, you will receive your results only by: MyChart Message (if you have MyChart) OR A paper copy in the mail If you have any lab test that is abnormal or we need to change your treatment, we will call you to review the results.  Testing/Procedures: No orders  Follow-Up: At [facility], you and your health needs are our priority.  As part of our continuing mission to provide you with exceptional heart care, our providers are all part of one team.  This team includes your primary Cardiologist (physician) and Advanced Practice Providers or APPs (Physician Assistants and Nurse Practitioners) who all work together to provide you with the care you need, when you need it.  Your next appointment:    F/U March 2026  Provider:   Jerel Balding, MD    We recommend signing up for the patient portal called MyChart.  Sign up information is provided on this After Visit Summary.  MyChart is used to connect with patients for Virtual Visits (Telemedicine).  Patients are able to view lab/test results, encounter notes, upcoming appointments, etc.  Non-urgent messages can be sent to your provider as well.   To learn more about what you can do with MyChart, go to [email or web address].

## 2024-01-29 ENCOUNTER — Encounter: Payer: Self-pay | Admitting: Cardiovascular Disease

## 2024-02-14 ENCOUNTER — Telehealth: Payer: Self-pay | Admitting: Family Medicine

## 2024-02-14 DIAGNOSIS — R32 Unspecified urinary incontinence: Secondary | ICD-10-CM

## 2024-02-14 NOTE — Telephone Encounter (Signed)
 Referral placed.

## 2024-02-14 NOTE — Telephone Encounter (Signed)
 Copied from CRM #[id number]. Topic: Referral - Request for Referral >> Feb 14, 2024  3:38 PM Larissa S wrote: Did the patient discuss referral with their provider in the last year?  (If No - schedule appointment) (If Yes - send message)  Appointment offered?   Type of order/referral and detailed reason for visit: Urology, urinary leakage  Preference of office, provider, location:  [facility] in [place].  [phone] Lamp, calling on behalf of patient  If referral order, have you been seen by this specialty before? Yes, last seen in 2018. Dr. Little office requires new referral.  (If Yes, this issue or another issue? When? Where?  Can we respond through MyChart? No

## 2024-03-08 DIAGNOSIS — E1142 Type 2 diabetes mellitus with diabetic polyneuropathy: Secondary | ICD-10-CM | POA: Diagnosis not present

## 2024-03-08 DIAGNOSIS — B351 Tinea unguium: Secondary | ICD-10-CM | POA: Diagnosis not present

## 2024-03-08 DIAGNOSIS — M79674 Pain in right toe(s): Secondary | ICD-10-CM | POA: Diagnosis not present

## 2024-03-08 DIAGNOSIS — M79675 Pain in left toe(s): Secondary | ICD-10-CM | POA: Diagnosis not present

## 2024-03-21 ENCOUNTER — Ambulatory Visit: Admitting: Urology

## 2024-03-21 ENCOUNTER — Encounter: Payer: Self-pay | Admitting: Urology

## 2024-03-21 VITALS — BP 158/67 | HR 59

## 2024-03-21 DIAGNOSIS — N401 Enlarged prostate with lower urinary tract symptoms: Secondary | ICD-10-CM | POA: Diagnosis not present

## 2024-03-21 DIAGNOSIS — N3944 Nocturnal enuresis: Secondary | ICD-10-CM

## 2024-03-21 DIAGNOSIS — R3915 Urgency of urination: Secondary | ICD-10-CM | POA: Diagnosis not present

## 2024-03-21 DIAGNOSIS — N138 Other obstructive and reflux uropathy: Secondary | ICD-10-CM | POA: Diagnosis not present

## 2024-03-21 LAB — URINALYSIS, ROUTINE W REFLEX MICROSCOPIC
Bilirubin, UA: NEGATIVE
Leukocytes,UA: NEGATIVE
Nitrite, UA: NEGATIVE
Protein,UA: NEGATIVE
RBC, UA: NEGATIVE
Specific Gravity, UA: 1.025 (ref 1.005–1.030)
Urobilinogen, Ur: 1 mg/dL (ref 0.2–1.0)
pH, UA: 6 (ref 5.0–7.5)

## 2024-03-21 LAB — BLADDER SCAN AMB NON-IMAGING: Scan Result: 15

## 2024-03-21 MED ORDER — GEMTESA 75 MG PO TABS: 1.0000 | ORAL_TABLET | Freq: Every day | ORAL | Status: DC

## 2024-03-21 NOTE — Patient Instructions (Signed)

## 2024-03-21 NOTE — Progress Notes (Signed)
 03/21/2024 3:12 PM   [name] Oct 11, 1945 [id number]  Referring provider: [name], [name], MD [address] [place],  [place] [address]  Urinary incontinence   HPI: Mr [name] is a 78yo here for evaluation of urinary incontinence. For the past 3-4 months he has noted urinary urgency with associated urge incontinence. He has occasional nocturnal enuresis. IPSS 12 QOL 5 on no BPH therapy. Urine stream is strong. No straining to urinate. Nocturia 2-3x.    PMH: Past Medical History:  Diagnosis Date   Anxiety    Arthritis    back (04/05/2014)   Atrial fibrillation (HCC)    Blood in stool    Bradycardia- with decrease of BB some improvement 09/10/2017   CAD (coronary artery disease)    CAD s/p CABG 2006 08/16/2013   a. Dr Scott - to LAD, SVG to diagonal, SVG to OM, SVG to PDA ;  b.  LHC (9/15): ostial LAD occl, ostial CFX 60%, prox AVCFX 95%, RCA 95%, S-PDA occl, S-OM/Dx with Dx limb occl and prox 60%, L-LAD patent, EF 35-40% wtih ant-lat and apical HK-AK >> PCI:  BMS to native RCA    Diverticulosis    Dizziness    DM type 2 causing complication (HCC) 08/16/2013   GERD (gastroesophageal reflux disease)    Hypertension    Iliac artery aneurysm (HCC)    Ischemic cardiomyopathy    a. EF 35-40% by LV gram 03/2014   Joint pain    Kidney stones    Kidney stones    Lower back pain Jan 2015   Mixed hyperlipidemia 08/16/2013   Myocardial infarction (HCC) 03/2014 X 2?   S/P angioplasty with stent DES mid SVG to OM and DES to prox. SVG to OM, DAPT for lifetime 09/10/2017   Type II diabetes mellitus [name])     Surgical History: Past Surgical History:  Procedure Laterality Date   CARDIAC CATHETERIZATION  2006   before [facility]   CORONARY ANGIOPLASTY WITH STENT PLACEMENT  04/05/2014   1   CORONARY ARTERY BYPASS GRAFT  2006   LIMA to LAD,SVG to diagonal,SVG to obtuse marginal,SVG to posterior descending   CORONARY STENT INTERVENTION N/A 09/09/2017   Procedure: CORONARY STENT  INTERVENTION;  Surgeon: Scott Lonni BIRCH, MD;  Location: [facility];  Service: Cardiovascular;  Laterality: N/A;   CORONARY STENT INTERVENTION N/A 09/28/2023   Procedure: CORONARY STENT INTERVENTION;  Surgeon: Scott Lonni BIRCH, MD;  Location: [facility];  Service: Cardiovascular;  Laterality: N/A;   CORONARY STENT PLACEMENT  09/09/2017   CYSTOSCOPY W/ STONE MANIPULATION  1970's X 2   CYSTOSCOPY W/ URETEROSCOPY W/ LITHOTRIPSY  ~ 2009   ILIAC ARTERY STENT Left Aug. 10, 2011   Aorta to right ext iliac and left CIA stent repair    LEFT HEART CATH AND CORS/GRAFTS ANGIOGRAPHY N/A 09/08/2017   Procedure: LEFT HEART CATH AND CORS/GRAFTS ANGIOGRAPHY;  Surgeon: Scott Scott LABOR, MD;  Location: [facility];  Service: Cardiovascular;  Laterality: N/A;   LEFT HEART CATH AND CORS/GRAFTS ANGIOGRAPHY N/A 09/28/2023   Procedure: LEFT HEART CATH AND CORS/GRAFTS ANGIOGRAPHY;  Surgeon: Scott Lonni BIRCH, MD;  Location: [facility];  Service: Cardiovascular;  Laterality: N/A;   LEFT HEART CATHETERIZATION WITH CORONARY/GRAFT ANGIOGRAM N/A 04/05/2014   Procedure: LEFT HEART CATHETERIZATION WITH EL BILE;  Surgeon: [name] Burnard, MD;  Location: [facility] CATH [facility];  Service: Cardiovascular;  Laterality: N/A;   NM MYOCAR PERF WALL MOTION  11/18/2009  low risk   TONSILLECTOMY AND ADENOIDECTOMY  1951    Home Medications:  Allergies as of 03/21/2024       Reactions   Codeine Nausea Only, Other (See Comments)   Cannot take on empty stomach.   Valium Nausea Only, Other (See Comments)   Cannot take on empty stomach.   Semaglutide    abdominal pain   Simvastatin  Other (See Comments)   Unknown reaction    Sitagliptin Other (See Comments)   Unknown reaction    Empagliflozin  Palpitations        Medication List        Accurate as of March 21, 2024  3:12 PM. If you have any questions, ask your nurse or doctor.          amLODipine  10 MG  tablet Commonly known as: NORVASC  TAKE ONE-HALF TABLET BY MOUTH  DAILY   aspirin  81 MG tablet Take 81 mg by mouth daily.   clopidogrel  75 MG tablet Commonly known as: Plavix  Take 1 tablet (75 mg total) by mouth daily.   Contour Next Gen Monitor Devi SMARTSIG:1-3 Times Daily   glipiZIDE 5 MG 24 hr tablet Commonly known as: GLUCOTROL XL Take 5 mg by mouth daily.   Lantus  100 UNIT/ML injection Generic drug: insulin  glargine Inject 22 Units into the skin daily.   metoprolol  succinate 25 MG 24 hr tablet Commonly known as: TOPROL -XL TAKE ONE-HALF TABLET BY MOUTH  DAILY   nitroGLYCERIN  0.4 MG SL tablet Commonly known as: NITROSTAT  Place 0.4 mg under the tongue every 5 (five) minutes as needed for chest pain.   OneTouch Ultra test strip Generic drug: glucose blood CHECK BLOOD SUGAR 3 TIMES A DAY OR AS DIRECTED   pantoprazole  40 MG tablet Commonly known as: PROTONIX  TAKE 1 TABLET BY MOUTH DAILY   rosuvastatin  40 MG tablet Commonly known as: CRESTOR  Take 1 tablet (40 mg total) by mouth daily.   UltiCare Mini Pen Needles 31G X 6 MM Misc Generic drug: Insulin  Pen Needle USE AS DIRECTED        Allergies:  Allergies  Allergen Reactions   Codeine Nausea Only and Other (See Comments)    Cannot take on empty stomach.   Valium Nausea Only and Other (See Comments)    Cannot take on empty stomach.   Semaglutide     abdominal pain   Simvastatin  Other (See Comments)    Unknown reaction    Sitagliptin Other (See Comments)    Unknown reaction    Empagliflozin  Palpitations    Family History: Family History  Problem Relation Age of Onset   Diabetes Mother    Hypertension Mother    Heart disease Mother        AAA   Heart disease Sister    Heart disease Son        Heart Disease before age 56   Diabetes Brother    Thyroid  disease Neg Hx     Social History:  reports that he quit smoking about 6 years ago. His smoking use included cigars. He has never used smokeless  tobacco. He reports that he does not drink alcohol and does not use drugs.  ROS: All other review of systems were reviewed and are negative except what is noted above in HPI  Physical Exam: BP (!) 158/67   Pulse (!) 59   Constitutional:  Alert and oriented, No acute distress. HEENT: [place] AT, moist mucus membranes.  Trachea midline, no masses. Cardiovascular: No clubbing, cyanosis, or edema. Respiratory:  Normal respiratory effort, no increased work of breathing. GI: Abdomen is soft, nontender, nondistended, no abdominal masses GU: No CVA tenderness.  Lymph: No cervical or inguinal lymphadenopathy. Skin: No rashes, bruises or suspicious lesions. Neurologic: Grossly intact, no focal deficits, moving all 4 extremities. Psychiatric: Normal mood and affect.  Laboratory Data: Lab Results  Component Value Date   WBC 7.0 09/15/2023   HGB 14.2 09/15/2023   HCT 42.2 09/15/2023   MCV 85 09/15/2023   PLT 218 09/15/2023    Lab Results  Component Value Date   CREATININE 1.19 09/15/2023    Lab Results  Component Value Date   PSA 1.9 08/21/2015    No results found for: TESTOSTERONE  Lab Results  Component Value Date   HGBA1C 8.4 05/04/2023    Urinalysis    Component Value Date/Time   COLORURINE AMBER (A) 02/02/2014 0543   APPEARANCEUR CLEAR 02/02/2014 0543   LABSPEC 1.025 02/02/2014 0543   PHURINE 5.0 02/02/2014 0543   GLUCOSEU 500 (A) 02/02/2014 0543   HGBUR NEGATIVE 02/02/2014 0543   BILIRUBINUR NEGATIVE 02/02/2014 0543   KETONESUR 15 (A) 02/02/2014 0543   PROTEINUR NEGATIVE 02/02/2014 0543   UROBILINOGEN 1.0 02/02/2014 0543   NITRITE NEGATIVE 02/02/2014 0543   LEUKOCYTESUR NEGATIVE 02/02/2014 0543    Lab Results  Component Value Date   BACTERIA MANY (A) 10/27/2008    Pertinent Imaging:  Results for orders placed during the Steele encounter of 02/18/10  DG Abd 1 View  Narrative Clinical Data: Iliac artery aneurysm  ABDOMEN - 1 VIEW  Comparison:  None.  Findings: The series of images demonstrate access into the right iliac arterial system with the final image demonstrating any aortobi-iliac stent graft.  The landing zone on the left is the common iliac artery and that of the right is the external iliac artery.  Coil embolization of the right internal iliac artery is present.  IMPRESSION: Aortobi-iliac stent graft.  Provider: Diane Hamrick  No results found for this or any previous visit.  No results found for this or any previous visit.  No results found for this or any previous visit.  No results found for this or any previous visit.  No results found for this or any previous visit.  No results found for this or any previous visit.  No results found for this or any previous visit.   Assessment & Plan:    1. Nocturnal enuresis (Primary) We will trial gemtesa  75mg  daily - Urinalysis, Routine w reflex microscopic - BLADDER SCAN AMB NON-IMAGING  2. Benign prostatic hyperplasia with urinary obstruction -gemtesa  75mg  daily  3. Urinary urgency Gemtesa  75mg  daily   No follow-ups on file.  Belvie Clara, MD  [facility] [place]

## 2024-03-21 NOTE — Progress Notes (Signed)
 Bladder Scan completed today.  Patient can void prior to the bladder scan. Bladder scan result: 15  Performed By: [facility] LPN

## 2024-04-12 ENCOUNTER — Telehealth: Payer: Self-pay | Admitting: Urology

## 2024-04-12 DIAGNOSIS — R3915 Urgency of urination: Secondary | ICD-10-CM

## 2024-04-12 NOTE — Telephone Encounter (Signed)
 Patient called into the office today with general questions/concerns regarding Gemtesa  he was given. Says they work during the day but still has same problem at night. He would like to know if he should get an RX sent in or try something else. Patient may be reached at [phone] to discuss questions.

## 2024-04-12 NOTE — Telephone Encounter (Signed)
 Return call to pt regarding OAB. Pt has been on Gemtesa  3-4 weeks and want to know if there is an alternative medication for his OAB. Pt is aware a message will be sent to MD on advisement.Verbalized understanding

## 2024-04-17 NOTE — Telephone Encounter (Signed)
 Per Dr. Sherrilee Mirabegron 25mg  daily'

## 2024-04-17 NOTE — Telephone Encounter (Signed)
 Patient called needing medication sent to [facility], he is almost out

## 2024-04-20 MED ORDER — TROSPIUM CHLORIDE 20 MG PO TABS: 20.0000 mg | ORAL_TABLET | Freq: Two times a day (BID) | ORAL | 11 refills | Status: AC

## 2024-04-20 NOTE — Addendum Note (Signed)
 Addended by: GRETTA MASTERS R on: 04/20/2024 10:39 AM   Modules accepted: Orders

## 2024-04-20 NOTE — Telephone Encounter (Signed)
 Wife called back, needs RX soon he is going out of town

## 2024-04-20 NOTE — Telephone Encounter (Signed)
 Verbal from Dr. Sherrilee to send in Rx Trospium 20 mg BID to pharmacy. Pt made aware and voiced understanding.

## 2024-05-02 NOTE — Progress Notes (Addendum)
 [name]                                          MRN: [id number]   05/02/2024   The [facility] Quality Team Specialist reviewed this patient medical record for the purposes of chart review for care gap closure. The following were reviewed: chart review for care gap closure-kidney health evaluation for diabetes:eGFR  and uACR.  No uacr    [facility] Quality Team

## 2024-05-03 ENCOUNTER — Ambulatory Visit

## 2024-05-03 DIAGNOSIS — H25043 Posterior subcapsular polar age-related cataract, bilateral: Secondary | ICD-10-CM | POA: Diagnosis not present

## 2024-05-03 DIAGNOSIS — H5203 Hypermetropia, bilateral: Secondary | ICD-10-CM | POA: Diagnosis not present

## 2024-05-03 DIAGNOSIS — H43813 Vitreous degeneration, bilateral: Secondary | ICD-10-CM | POA: Diagnosis not present

## 2024-05-03 DIAGNOSIS — H524 Presbyopia: Secondary | ICD-10-CM | POA: Diagnosis not present

## 2024-05-03 DIAGNOSIS — H52203 Unspecified astigmatism, bilateral: Secondary | ICD-10-CM | POA: Diagnosis not present

## 2024-05-03 DIAGNOSIS — E119 Type 2 diabetes mellitus without complications: Secondary | ICD-10-CM | POA: Diagnosis not present

## 2024-05-03 DIAGNOSIS — H25013 Cortical age-related cataract, bilateral: Secondary | ICD-10-CM | POA: Diagnosis not present

## 2024-05-03 DIAGNOSIS — H2513 Age-related nuclear cataract, bilateral: Secondary | ICD-10-CM | POA: Diagnosis not present

## 2024-05-18 ENCOUNTER — Other Ambulatory Visit: Payer: Self-pay | Admitting: Nurse Practitioner

## 2024-05-18 DIAGNOSIS — E041 Nontoxic single thyroid nodule: Secondary | ICD-10-CM

## 2024-05-22 ENCOUNTER — Other Ambulatory Visit

## 2024-05-25 ENCOUNTER — Ambulatory Visit
Admission: RE | Admit: 2024-05-25 | Discharge: 2024-05-25 | Disposition: A | Discharge status: Home / Self Care | Source: Ambulatory Visit | Attending: Nurse Practitioner | Admitting: Nurse Practitioner

## 2024-05-25 DIAGNOSIS — E041 Nontoxic single thyroid nodule: Secondary | ICD-10-CM

## 2024-07-02 ENCOUNTER — Other Ambulatory Visit: Payer: Self-pay | Admitting: Cardiovascular Disease

## 2024-07-02 ENCOUNTER — Other Ambulatory Visit: Payer: Self-pay | Admitting: Cardiology

## 2024-07-09 ENCOUNTER — Telehealth: Payer: Self-pay

## 2024-07-09 DIAGNOSIS — R3915 Urgency of urination: Secondary | ICD-10-CM

## 2024-07-09 NOTE — Telephone Encounter (Signed)
 Patient's appointment needing to be  rescheduled.  He is also needing to discuss issues with medication given at last appointment.  Please advise.  Call:  [phone]

## 2024-07-09 NOTE — Telephone Encounter (Signed)
 Return call to patient. Patient state's he can not afford the gemtesa  RX and want to know if there is anything else that can be prescribe. Pt appointment rescheduled.  Pt is aware samples of Gemtesa  will be up front for him to pick up. Voiced understanding

## 2024-07-11 ENCOUNTER — Ambulatory Visit: Admitting: Urology

## 2024-07-24 NOTE — Telephone Encounter (Signed)
Mirabegron 25 mg daily

## 2024-07-25 ENCOUNTER — Other Ambulatory Visit: Payer: Self-pay | Admitting: Cardiovascular Disease

## 2024-07-31 MED ORDER — TROSPIUM CHLORIDE ER 60 MG PO CP24: 1.0000 | ORAL_CAPSULE | Freq: Every day | ORAL | 11 refills | Status: AC

## 2024-07-31 NOTE — Telephone Encounter (Signed)
 Pt was made aware and voiced understanding Sanctura  60mg  daily

## 2024-08-24 ENCOUNTER — Ambulatory Visit: Admitting: Urology
